# Patient Record
Sex: Male | Born: 1950 | Race: White | Hispanic: No | Smoking: Former smoker
Health system: Southern US, Community
[De-identification: ages and names within clinical notes are randomized; demographics above are authoritative.]

## PROBLEM LIST (undated history)

## (undated) DIAGNOSIS — D649 Anemia, unspecified: Secondary | ICD-10-CM

## (undated) DIAGNOSIS — G20A1 Parkinson's disease without dyskinesia, without mention of fluctuations: Secondary | ICD-10-CM

## (undated) DIAGNOSIS — M489 Spondylopathy, unspecified: Secondary | ICD-10-CM

## (undated) DIAGNOSIS — J942 Hemothorax: Secondary | ICD-10-CM

## (undated) DIAGNOSIS — R112 Nausea with vomiting, unspecified: Secondary | ICD-10-CM

## (undated) DIAGNOSIS — R7881 Bacteremia: Secondary | ICD-10-CM

## (undated) DIAGNOSIS — Z9889 Other specified postprocedural states: Secondary | ICD-10-CM

## (undated) DIAGNOSIS — H8109 Meniere's disease, unspecified ear: Secondary | ICD-10-CM

## (undated) DIAGNOSIS — K449 Diaphragmatic hernia without obstruction or gangrene: Secondary | ICD-10-CM

## (undated) DIAGNOSIS — E7800 Pure hypercholesterolemia, unspecified: Secondary | ICD-10-CM

## (undated) DIAGNOSIS — I4891 Unspecified atrial fibrillation: Secondary | ICD-10-CM

## (undated) DIAGNOSIS — B952 Enterococcus as the cause of diseases classified elsewhere: Secondary | ICD-10-CM

## (undated) DIAGNOSIS — I059 Rheumatic mitral valve disease, unspecified: Secondary | ICD-10-CM

## (undated) DIAGNOSIS — U071 COVID-19: Secondary | ICD-10-CM

## (undated) DIAGNOSIS — Z952 Presence of prosthetic heart valve: Secondary | ICD-10-CM

## (undated) DIAGNOSIS — K829 Disease of gallbladder, unspecified: Secondary | ICD-10-CM

## (undated) DIAGNOSIS — E785 Hyperlipidemia, unspecified: Secondary | ICD-10-CM

## (undated) DIAGNOSIS — R079 Chest pain, unspecified: Secondary | ICD-10-CM

## (undated) DIAGNOSIS — G20 Parkinson's disease: Secondary | ICD-10-CM

## (undated) DIAGNOSIS — E0590 Thyrotoxicosis, unspecified without thyrotoxic crisis or storm: Secondary | ICD-10-CM

## (undated) DIAGNOSIS — N400 Enlarged prostate without lower urinary tract symptoms: Secondary | ICD-10-CM

## (undated) DIAGNOSIS — J939 Pneumothorax, unspecified: Secondary | ICD-10-CM

## (undated) DIAGNOSIS — R0902 Hypoxemia: Secondary | ICD-10-CM

## (undated) DIAGNOSIS — R0602 Shortness of breath: Secondary | ICD-10-CM

## (undated) HISTORY — DX: Presence of prosthetic heart valve: Z95.2

## (undated) HISTORY — DX: Meniere's disease, unspecified ear: H81.09

## (undated) HISTORY — PX: CARDIAC CATHETERIZATION: SHX172

## (undated) HISTORY — DX: Rheumatic mitral valve disease, unspecified: I05.9

## (undated) HISTORY — DX: Enterococcus as the cause of diseases classified elsewhere: B95.2

## (undated) HISTORY — DX: Hyperlipidemia, unspecified: E78.5

## (undated) HISTORY — DX: Anemia, unspecified: D64.9

## (undated) HISTORY — PX: COLONOSCOPY: SHX174

## (undated) HISTORY — DX: Bacteremia: R78.81

## (undated) HISTORY — DX: Hypoxemia: R09.02

## (undated) HISTORY — DX: Pure hypercholesterolemia, unspecified: E78.00

## (undated) HISTORY — DX: Thyrotoxicosis, unspecified without thyrotoxic crisis or storm: E05.90

## (undated) HISTORY — DX: Parkinson's disease: G20

## (undated) HISTORY — DX: Pneumothorax, unspecified: J93.9

## (undated) HISTORY — DX: Spondylopathy, unspecified: M48.9

## (undated) HISTORY — DX: Disease of gallbladder, unspecified: K82.9

## (undated) HISTORY — DX: Shortness of breath: R06.02

## (undated) HISTORY — DX: Chest pain, unspecified: R07.9

## (undated) HISTORY — DX: Unspecified atrial fibrillation: I48.91

## (undated) HISTORY — DX: Diaphragmatic hernia without obstruction or gangrene: K44.9

## (undated) HISTORY — DX: Benign prostatic hyperplasia without lower urinary tract symptoms: N40.0

## (undated) HISTORY — PX: TONSILLECTOMY: SUR1361

## (undated) HISTORY — DX: COVID-19: U07.1

## (undated) HISTORY — PX: THYROIDECTOMY: SHX17

## (undated) HISTORY — DX: Parkinson's disease without dyskinesia, without mention of fluctuations: G20.A1

## (undated) NOTE — *Deleted (*Deleted)
CARDIOLOGY CONSULT NOTE       Patient ID: Brandon [name] MRN: [id number] DOB/AGE: 1951/07/04 14 y.o.  Admit date: (Not on file) Referring Physician: Little Primary Physician: Brandon Gosselin, MD Primary Cardiologist: New Reason for Consultation: Chest Pain  Active Problems:   * No active hospital problems. *   HPI:  61 y.o. referred by Dr Clarene Duke for chest pain I last saw patient in 2013. Previously seen by Dr Rudean Haskell with cath 2007 30-40% RCA/LAD disease Had normal stress echo 2013.  History of former smoking, HTN and HLD Medicare screening for AAA negative [place] 07/25/20 Sees Dr Tat for Parkinson's currently not on meds Had COVID in August 2021 received monoclonal antibody as outpatient    Went to ER 07/03/20 for central chest pain with palpitations and mild dyspnea but left before being seen due to delay in being seen CXR NAD Troponin negative x 2 Hb/Hct elevated 19.9 and 54.8 in setting of positive COVID test and testosterone use Chest pain was substernal and occurred after using elliptical and HR jumped to 155 bpm Chest pain for several hours despite negative troponin which suggests non cardiac etiology   *** ROS All other systems reviewed and negative except as noted above  Past Medical History:  Diagnosis Date  . Atrial fibrillation (HCC)   . BPH (benign prostatic hyperplasia)   . Cervical spine disease   . Chest pain 2007   none since  . Gallbladder disorder   . Hemopneumothorax on left   . Hiatal hernia   . Hypercholesteremia   . Hyperlipidemia   . Meniere disease   . Meniere's disease   . PONV (postoperative nausea and vomiting)    also has trouble waking up    Family History  Problem Relation Age of Onset  . Hypertension Mother   . Breast cancer Mother   . Heart disease Father   . Emphysema Father   . Heart attack Sister   . Heart attack Brother     Social History   Socioeconomic History  . Marital status: Widowed    Spouse name: Not on file  . Number of  children: Not on file  . Years of education: Not on file  . Highest education level: Not on file  Occupational History    Comment: factory work  Tobacco Use  . Smoking status: Former Smoker    Quit date: 04/18/1979    Years since quitting: 41.5  . Smokeless tobacco: Never Used  Vaping Use  . Vaping Use: Never used  Substance and Sexual Activity  . Alcohol use: Yes    Comment: 3 timesa a week (wine or beer)  . Drug use: No  . Sexual activity: Not on file  Other Topics Concern  . Not on file  Social History Narrative   Two level home alone   Right handed   Caffeine - one cup coffee am    Exercise - yes walk dogs   Education - 1 year of college    Social Determinants of Health   Financial Resource Strain:   . Difficulty of Paying Living Expenses: Not on file  Food Insecurity:   . Worried About Programme researcher, broadcasting/film/video in the Last Year: Not on file  . Ran Out of Food in the Last Year: Not on file  Transportation Needs:   . Lack of Transportation (Medical): Not on file  . Lack of Transportation (Non-Medical): Not on file  Physical Activity:   . Days of Exercise per  Week: Not on file  . Minutes of Exercise per Session: Not on file  Stress:   . Feeling of Stress : Not on file  Social Connections:   . Frequency of Communication with Friends and Family: Not on file  . Frequency of Social Gatherings with Friends and Family: Not on file  . Attends Religious Services: Not on file  . Active Member of Clubs or Organizations: Not on file  . Attends Banker Meetings: Not on file  . Marital Status: Not on file  Intimate Partner Violence:   . Fear of Current or Ex-Partner: Not on file  . Emotionally Abused: Not on file  . Physically Abused: Not on file  . Sexually Abused: Not on file    Past Surgical History:  Procedure Laterality Date  . CARDIAC CATHETERIZATION    . COLONOSCOPY    . ORIF CLAVICULAR FRACTURE Left 04/18/2016   Procedure: OPEN REDUCTION INTERNAL FIXATION  (ORIF) LEFT CLAVICLE FRACTURE;  Surgeon: Francena Hanly, MD;  Location: [facility] OR;  Service: Orthopedics;  Laterality: Left;  . THYROIDECTOMY    . TONSILLECTOMY        Current Outpatient Medications:  .  cholecalciferol (VITAMIN D) 1000 units tablet, Take 1,000 Units by mouth daily., Disp: , Rfl:  .  levothyroxine (SYNTHROID) 175 MCG tablet, Take 175 mcg by mouth daily., Disp: , Rfl:  .  OVER THE COUNTER MEDICATION, Gallbladder supplement CBD oil q hs for sleeping, Disp: , Rfl:  .  Probiotic Product (PROBIOTIC-10 PO), Take 1 capsule by mouth daily. , Disp: , Rfl:  .  testosterone cypionate (DEPOTESTOSTERONE CYPIONATE) 200 MG/ML injection, INJECT 0.6 MLS INTRAMUSCULARLY INTO LATERAL THIGH WEEKLY., Disp: , Rfl:  .  triamterene-hydrochlorothiazide (DYAZIDE) 37.5-25 MG capsule, Take 1 capsule by mouth daily., Disp: , Rfl:     Physical Exam: There were no vitals taken for this visit.   Affect appropriate Healthy:  appears stated age HEENT: normal Neck supple with no adenopathy JVP normal no bruits no thyromegaly Lungs clear with no wheezing and good diaphragmatic motion Heart:  S1/S2 no murmur, no rub, gallop or click PMI normal Abdomen: benighn, BS positve, no tenderness, no AAA no bruit.  No HSM or HJR Distal pulses intact with no bruits No edema Neuro non-focal Skin warm and dry No muscular weakness   Labs:   Lab Results  Component Value Date   WBC 7.1 07/03/2020   HGB 17.9 (H) 07/03/2020   HCT 54.8 (H) 07/03/2020   MCV 95.5 07/03/2020   PLT 255 07/03/2020   No results for input(s): NA, K, CL, CO2, BUN, CREATININE, CALCIUM, PROT, BILITOT, ALKPHOS, ALT, AST, GLUCOSE in the last 168 hours.  Invalid input(s): LABALBU No results found for: CKTOTAL, CKMB, CKMBINDEX, TROPONINI No results found for: CHOL No results found for: HDL No results found for: LDLCALC No results found for: TRIG No results found for: CHOLHDL No results found for: LDLDIRECT    Radiology: No results  found.  EKG: 07/07/20 SR rate 74 normal 10/16/20 SR rate 71 normal    ASSESSMENT AND PLAN:   1. Chest Pain:  Mild RCA/LAD disease cath 2007 normal stress echo 2013. ***  2. Elevated Hb/Hct:  He is on testosterone which should be stopped or decreased at the least f/u with primary  ***  3. Parkinson's:  Mild LUE tremor f/u Dr Tat no currently on medication   4. HTN:  Well controlled.  Continue current medications and low sodium Dash type diet.  5. Thyroid:  On replacement labs with primary   ***  Signed: Charlton Haws 10/16/2020, 10:25 AM

---

## 2000-02-28 ENCOUNTER — Ambulatory Visit (HOSPITAL_COMMUNITY): Admission: RE | Admit: 2000-02-28 | Discharge: 2000-02-28 | Payer: Self-pay | Admitting: *Deleted

## 2002-10-05 ENCOUNTER — Encounter (INDEPENDENT_AMBULATORY_CARE_PROVIDER_SITE_OTHER): Payer: Self-pay | Admitting: *Deleted

## 2002-10-05 ENCOUNTER — Ambulatory Visit (HOSPITAL_COMMUNITY): Admission: RE | Admit: 2002-10-05 | Discharge: 2002-10-05 | Payer: Self-pay | Admitting: *Deleted

## 2005-11-12 ENCOUNTER — Emergency Department (HOSPITAL_COMMUNITY): Admission: EM | Admit: 2005-11-12 | Discharge: 2005-11-12 | Payer: Self-pay | Admitting: Emergency Medicine

## 2005-11-25 DIAGNOSIS — R079 Chest pain, unspecified: Secondary | ICD-10-CM

## 2005-11-25 HISTORY — DX: Chest pain, unspecified: R07.9

## 2005-11-29 ENCOUNTER — Encounter: Admission: RE | Admit: 2005-11-29 | Discharge: 2005-11-29 | Payer: Self-pay | Admitting: Gastroenterology

## 2006-02-10 ENCOUNTER — Inpatient Hospital Stay (HOSPITAL_COMMUNITY): Admission: AD | Admit: 2006-02-10 | Discharge: 2006-02-11 | Payer: Self-pay | Admitting: Cardiology

## 2006-02-10 ENCOUNTER — Encounter: Payer: Self-pay | Admitting: Emergency Medicine

## 2006-03-31 ENCOUNTER — Ambulatory Visit (HOSPITAL_COMMUNITY): Admission: RE | Admit: 2006-03-31 | Discharge: 2006-03-31 | Payer: Self-pay | Admitting: Gastroenterology

## 2006-11-23 ENCOUNTER — Emergency Department (HOSPITAL_COMMUNITY): Admission: EM | Admit: 2006-11-23 | Discharge: 2006-11-23 | Payer: Self-pay | Admitting: Emergency Medicine

## 2007-06-25 ENCOUNTER — Other Ambulatory Visit: Admission: RE | Admit: 2007-06-25 | Discharge: 2007-06-25 | Payer: Self-pay | Admitting: Unknown Physician Specialty

## 2007-11-16 ENCOUNTER — Ambulatory Visit (HOSPITAL_COMMUNITY): Admission: RE | Admit: 2007-11-16 | Discharge: 2007-11-17 | Payer: Self-pay | Admitting: Surgery

## 2007-11-16 ENCOUNTER — Encounter (INDEPENDENT_AMBULATORY_CARE_PROVIDER_SITE_OTHER): Payer: Self-pay | Admitting: Surgery

## 2009-03-01 ENCOUNTER — Encounter: Admission: RE | Admit: 2009-03-01 | Discharge: 2009-03-01 | Payer: Self-pay | Admitting: Otolaryngology

## 2011-04-09 NOTE — Op Note (Signed)
Brandon [name], Brandon [name] NO.:  [id number]   MEDICAL RECORD NO.:  [id number]          PATIENT TYPE:  OIB   LOCATION:  1615                         FACILITY:  [facility]   PHYSICIAN:  Velora Heckler, MD      DATE OF BIRTH:  Mar 01, 1951   DATE OF PROCEDURE:  11/16/2007  DATE OF DISCHARGE:                               OPERATIVE REPORT   PREOPERATIVE DIAGNOSIS:  Bilateral thyroid nodules with cytologic  atypia.   POSTOP DIAGNOSIS:  Bilateral thyroid nodules with cytologic atypia.   PROCEDURE:  Total thyroidectomy.   SURGEON:  Velora Heckler, MD, FACS   ANESTHESIA:  General.   ESTIMATED BLOOD LOSS:  Minimal.   PREPARATION:  Betadine.   COMPLICATIONS:  None.   INDICATIONS:  The patient is a 60 year old white male from [place],  [place].  The patient was found to have bilateral thyroid  nodules, incidentally.  The patient had a follow up thyroid ultrasound  at [facility].  This showed an enlarged thyroid gland with  multiple nodules.  Fine-needle aspiration was performed, and showed  cytologic atypia with nuclear changes.  There were calcifications noted  on ultrasound.  Features were felt to be worrisome for possible thyroid  malignancy, and he was referred to surgery for thyroidectomy.   BODY OF REPORT:  Procedure is done in OR #11 at the [facility].  The patient is brought to the operating room,  placed in the supine position on the operating room table.  Following  administration of general anesthesia, the patient is positioned; and  then prepped and draped in the usual strict aseptic fashion.  After  ascertaining that an adequate level of anesthesia had been achieved, a  Kocher incision was made with a #15 blade.  Dissection was carried  through subcutaneous tissues and platysma.  Hemostasis was obtained with  the electrocautery.   Skin flaps were elevated cephalad and caudad from the thyroid notch to  the sternal notch.   A Mahorner self-retaining retractor is placed for  exposure.  Strap muscles are incised in the midline and dissection is  begun on the left.  Left thyroid lobe is slightly enlarged.  It contains  at least one dominant nodule towards the inferior pole.   Gland is gently mobilized with the Barista.  Venous tributaries  are divided between medium Ligaclips with the harmonic scalpel.  Superior pole vessels are dissected out, ligated in continuity with 2-0  silk ties, and divided with the harmonic scalpel.  Gland is rolled  anteriorly.  Parathyroid tissue is identified and preserved on its  vascular pedicle.  Inferior venous tributaries were ligated in  continuity with 2-0 silk ties, and divided with the harmonic scalpel.  Branches of the inferior thyroid artery are divided between small  Ligaclips with the harmonic scalpel.  Recurrent nerve is identified and  preserved.  Dissection is carried down to the ligament of Allyson Sabal which is  transected with the electrocautery.  Gland is mobilized up and onto the  anterior trachea.  A moderate size pyramidal lobe is dissected out  off  of the thyroid cartilage using the electrocautery for hemostasis.   Next, we turned our attention to the right thyroid lobe.  Again, strap  muscles are reflected laterally.  Multiple nodules were palpable within  the right lobe.  Venous tributaries are, again, divided between medium  Ligaclips with the harmonic scalpel.  Inferior venous tributaries are  ligated in continuity with 2-0 silk ties, and divided with the harmonic  scalpel.  Gland is rolled anteriorly.  Branches of the inferior thyroid  artery are divided between small Ligaclips with the harmonic scalpel.  Gland is rolled further anteriorly, and the ligament of Berry transected  with electrocautery.  A small remnant of thyroid tissue is noted next to  the recurrent nerve on the right.  Gland is excised off the trachea with  the electrocautery.  A  suture is used to mark the right superior pole.  The entire thyroid gland is then submitted to pathology for review.   Neck is irrigated bilaterally and good hemostasis is achieved.  Surgicel  is placed in the operative field.  Strap muscles are reapproximated in  midline with interrupted 3-0 Vicryl sutures.  Platysma is closed with  interrupted 3-0 Vicryl sutures.  Skin is closed with a running 4-0  Monocryl subcuticular suture.  Wound is washed and dried and Benzoin and  Steri-Strips are applied.  Sterile dressings are applied.  The patient  is awakened from anesthesia, and brought to the recovery room in stable  condition.  The patient tolerated the procedure well.      Velora Heckler, MD  Electronically Signed     TMG/MEDQ  D:  11/16/2007  T:  11/16/2007  Job:  [id number]   cc:   Thereasa Solo. Little, M.D.  Fax: [phone]   Dorisann Frames, M.D.  Fax: [phone]

## 2011-04-12 NOTE — H&P (Signed)
NAMEKASTEN, LEVEQUE NO.:  [id number]   MEDICAL RECORD NO.:  [id number]          PATIENT TYPE:  OIB   LOCATION:  2103                         FACILITY:  [facility]   PHYSICIAN:  Colleen Can. Deborah Chalk, M.D.DATE OF BIRTH:  [birth year]   DATE OF ADMISSION:  02/10/2006  DATE OF DISCHARGE:                                HISTORY & PHYSICAL   HISTORY:  Mr. Fees presented to [facility] Emergency Room with anterior  chest pain lasting approximately eight hours.  He was driving back from  [place] and began to have substernal chest discomfort that he attributed to a  previously diagnosed hiatal hernia.  He remained full in his chest and he  was unable to get relief with antacids.  He was brought to the Emergency  Room at [facility] at 6:12 a.m.  His initial EKG showed what was  felt to be ST elevation inferiorly.  His initial cardiac enzymes were  negative.  He is now referred for admission and cardiac catheterization.  He  has a positive family history of heart disease with his father having heart  attack at age 72.  He is not a smoker and generally has good health habits.  He tries to jog three times a week.  He has a history of  hypercholesterolemia.   He has been carrying a diagnosis of hiatal hernia and has had some GI  discomfort and distress over the last two or three years.  It has been  intermittently diagnosed as gallbladder disorder, but that has not been  confirmed.  His recent diagnosis includes hiatal hernia diagnosed by  endoscopy.  He has been on Protonix, but without significant relief.   PAST MEDICAL HISTORY:   ALLERGIES:  None.   CURRENT MEDICATIONS:  Protonix.   PREVIOUS HOSPITALIZATIONS:  None.   PREVIOUS OPERATIONS:  Sinus problems.   SOCIAL HISTORY:  He works for [employer].  He is married.  Nonsmoker.   REVIEW OF SYSTEMS:  Otherwise unremarkable.   PHYSICAL EXAMINATION:  GENERAL:  He is a pleasant 60 year old male with  moderate anterior  chest discomfort.  HEENT:  Negative.  NECK:  Supple.  LUNGS:  Clear.  HEART:  Regular rate and rhythm.  ABDOMEN:  Soft, nontender.  EXTREMITIES:  Peripheral pulses are intact.   His EKG shows ST elevation inferiorly consistent with an acute inferior  myocardial infarction.   Chest x-ray is pending.   OVERALL IMPRESSION:  1.  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  2.  History of hiatal hernia.  3.  History of hypercholesterolemia.   PLAN:  Patient will be admitted.  We will do acute catheterization.      Colleen Can. Deborah Chalk, M.D.  Electronically Signed     SNT/MEDQ  D:  02/10/2006  T:  02/10/2006  Job:  [id number]   cc:   Caryn Bee L. Little, M.D.  Fax: [phone]

## 2011-04-12 NOTE — Cardiovascular Report (Signed)
Brandon [name], Brandon [name] NO.:  [id number]   MEDICAL RECORD NO.:  [id number]          PATIENT TYPE:  OIB   LOCATION:  2103                         FACILITY:  [facility]   PHYSICIAN:  Colleen Can. Deborah Chalk, M.D.DATE OF BIRTH:  Oct 10, 1951   DATE OF PROCEDURE:  02/10/2006  DATE OF DISCHARGE:                              CARDIAC CATHETERIZATION   PROCEDURE:  Left heart catheterization with selective coronary angiography  and left ventricular angiography.   TYPE/SITE OF ENTRY:  Percutaneous right femoral artery.   CATHETERS:  6-French #4 curved Judkins right and left coronary cath, 6-  French pigtail ventriculographic catheter.   CONTRAST MATERIAL:  Omnipaque.   MEDICATIONS:  Given prior procedure, heparin.   Medications given during the procedure, nitroglycerin.   CONTRAST:  The patient tolerated the procedure well.   HEMODYNAMIC DATA:  The aortic pressure was 101/71. LV was 98/6-9. There was  no aortic valve gradient on pullback.   CORONARY ARTERIES:  Coronary arteries arise and distribute normally.  1.  Left main coronary artery is normal.  2.  Left anterior descending: Left anterior descending has irregularities      throughout. There is a 30-40% narrowing in the midportion but there is      slow but continuous flow to the apex. There are scattered irregularities      throughout the entire course of the left anterior descending.  3.  Left circumflex: Left circumflex is a reasonably large vessel. It      continues mainly as an obtuse marginal that has four separate smaller      branches off of it. There are only minor irregularities in the obtuse      marginal. The continuation branch of the AV groove is free of      significant disease.  4.  Right coronary artery. The right coronary artery is a large dominant      vessel. It has only minor irregularities and is essentially normal.   The left ventricular angiogram performed in RAO position. Overall cardiac  size and  silhouette are normal. Global ejection fraction is 60%.   OVERALL IMPRESSION:  1.  Normal left ventricular function.  2.  30-40% narrowing in the mid-left anterior descending artery with      irregularities otherwise but with no acute infarct-related vessel.   DISCUSSION:  This catheterization was done acutely in the setting of what  appeared to be an acute inferior myocardial infarction with associated EKG  changes. The initial cardiac markers were negative and he had had pain for  approximately 8 hours. He had been carrying the previous diagnosis of a  hiatal hernia and attributed some of the pain initially to that. We will  need to further intensify aggressive management of cardiovascular risk  factors.      Colleen Can. Deborah Chalk, M.D.  Electronically Signed     SNT/MEDQ  D:  02/10/2006  T:  02/10/2006  Job:  [id number]

## 2011-04-12 NOTE — Discharge Summary (Signed)
NAMEHILMAR, MOLDOVAN NO.:  [id number]   MEDICAL RECORD NO.:  [id number]          PATIENT TYPE:  INP   LOCATION:  2103                         FACILITY:  [facility]   PHYSICIAN:  Colleen Can. Deborah Chalk, M.D.DATE OF BIRTH:  [birth year]   DATE OF ADMISSION:  02/10/2006  DATE OF DISCHARGE:  02/11/2006                                 DISCHARGE SUMMARY   PRIMARY DISCHARGE DIAGNOSIS:  Anterior chest pain with abnormal EKG,  negative cardiac enzymes and subsequent emergent cardiac catheterization  showing normal left ventricular function, minor irregularities of the right  coronary, normal left main, 30% to 40% narrowing in the mid portion of the  left anterior descending, and satisfactory left circumflex. Overall, the  patient is felt to have nonobstructive coronary disease.   SECONDARY DISCHARGE DIAGNOSES:  1.  Hiatal hernia.  2.  Questionable gallbladder disorder.   HISTORY OF PRESENT ILLNESS:  The patient is a 60 year old white male who has  a history of hiatal hernia and questionable gallbladder disorder. He has  been maintained on Protonix. He presented to [facility] emergency room with  anterior chest pain that had persisted for approximately eight hours. His  EKG in the emergency room showed ST-elevation inferiorly, cardiac enzymes  were negative, and he was referred for emergent cardiac catheterization.   Please see dictated history and physical for the patient's presentation and  profile.   LABORATORY DATA:  Cardiac enzymes were negative. EKG showed ST elevation  inferiorly.  CBC was normal. Chemistries are satisfactory. Total bilirubin  slightly elevated at 1.9. Chest x-ray shows low lung volumes with mild  bibasilar atelectasis.   HOSPITAL COURSE:  The patient was transferred from [facility] to  [facility] where he underwent cardiac catheterization and that  procedure was tolerated well without any known complications. The patient  was felt  to have nonobstructive coronary disease with mild to moderate  diffuse disease in the LAD. Post procedure he was transferred to the  intensive care unit. Cardiac enzymes were negative. We proceeded on with CT  scanning of the abdomen and chest to rule out mass as well as dissection  after being hydrated with IV fluids. Pending those results, the patient is  felt to be a satisfactory candidate for discharge later today.   Today, on February 11, 2006, CT scans are pending. He has had no further chest  pain, all cardiac enzymes are negative, physical exam is unremarkable, and  he will be discharged later today if CT scans are satisfactory. Plans are  made for him to have outpatient 2-D echocardiogram.   DISCHARGE CONDITION:  Stable.   DISCHARGE DIET:  Low fat.   WOUND CARE:  He is to place an ice pack if needed to the groin.   DISCHARGE MEDICATIONS:  1.  Coated aspirin daily.  2.  Plavix 75 mg daily.  3.  Protonix 40 mg b.i.d.  4.  Lipitor 10 mg daily.  5.  Nitroglycerin p.r.n.   We will see him back in the office on Friday. He is asked to call and  arrange that time. We  will be calling him in the interim, however, for 2-D  echocardiogram.      Sharlee Blew, N.P.      Colleen Can. Deborah Chalk, M.D.  Electronically Signed    LC/MEDQ  D:  02/11/2006  T:  02/12/2006  Job:  [id number]   cc:   Caryn Bee L. Little, M.D.  Fax: [phone]

## 2011-08-30 LAB — BASIC METABOLIC PANEL
BUN: 15
Calcium: 9.7
Chloride: 108
GFR calc Af Amer: >60

## 2011-08-30 LAB — URINALYSIS, ROUTINE W REFLEX MICROSCOPIC
Bilirubin Urine: NEGATIVE
Hgb urine dipstick: NEGATIVE

## 2011-08-30 LAB — CALCIUM: Calcium: 9.4

## 2011-08-30 LAB — DIFFERENTIAL
Basophils Absolute: 0
Eosinophils Absolute: 0.3
Eosinophils Relative: 4
Lymphs Abs: 1.7
Monocytes Absolute: 0.5
Neutro Abs: 4.8

## 2011-08-30 LAB — CBC
HCT: 43
Hemoglobin: 15.1

## 2012-07-31 ENCOUNTER — Emergency Department (HOSPITAL_COMMUNITY)
Admission: EM | Admit: 2012-07-31 | Discharge: 2012-08-01 | Disposition: A | Discharge status: Home / Self Care | Payer: 59 | Attending: Emergency Medicine | Admitting: Emergency Medicine

## 2012-07-31 ENCOUNTER — Emergency Department (HOSPITAL_COMMUNITY): Payer: 59

## 2012-07-31 ENCOUNTER — Encounter (HOSPITAL_COMMUNITY): Payer: Self-pay | Admitting: Emergency Medicine

## 2012-07-31 DIAGNOSIS — R079 Chest pain, unspecified: Secondary | ICD-10-CM | POA: Insufficient documentation

## 2012-07-31 LAB — BASIC METABOLIC PANEL
BUN: 10 mg/dL (ref 6–23)
Chloride: 100 mEq/L (ref 96–112)
GFR calc Af Amer: >90 mL/min (ref >90)
GFR calc non Af Amer: >90 mL/min (ref >90)

## 2012-07-31 LAB — CBC
HCT: 42.8 % (ref 39.0–52.0)
Hemoglobin: 14.5 g/dL (ref 13.0–17.0)
MCV: 92.4 fL (ref 78.0–100.0)
Platelets: 193 10*3/uL (ref 150–400)
RBC: 4.63 MIL/uL (ref 4.22–5.81)
RDW: 13.2 % (ref 11.5–15.5)
WBC: 7 10*3/uL (ref 4.0–10.5)

## 2012-07-31 MED ORDER — SODIUM CHLORIDE 0.9 % IV SOLN
1000.0000 mL | INTRAVENOUS | Status: DC
  Administered 2012-08-01: 1000 mL via INTRAVENOUS

## 2012-07-31 MED ORDER — ASPIRIN 81 MG PO CHEW
324.0000 mg | CHEWABLE_TABLET | Freq: Once | ORAL | Status: DC
  Filled 2012-07-31: qty 4

## 2012-07-31 MED ORDER — NITROGLYCERIN 0.4 MG SL SUBL: 0.4000 mg | SUBLINGUAL_TABLET | SUBLINGUAL | Status: DC | PRN

## 2012-07-31 NOTE — ED Notes (Signed)
C/o pain across chest x 2 days.  Denies nausea, vomiting, sob, and diaphoresis.  Reports BP elevated today.

## 2012-08-01 LAB — URINALYSIS, ROUTINE W REFLEX MICROSCOPIC
Ketones, ur: NEGATIVE mg/dL
Leukocytes, UA: NEGATIVE
Nitrite: NEGATIVE
Protein, ur: NEGATIVE mg/dL
Urobilinogen, UA: 0.2 mg/dL (ref 0.0–1.0)
pH: 7 (ref 5.0–8.0)

## 2012-08-01 NOTE — ED Notes (Signed)
Pt unable to give urine sample at this time 

## 2012-08-01 NOTE — ED Provider Notes (Signed)
History     CSN: [id number]  Arrival date & time 07/31/12  2111   First MD Initiated Contact with Patient 07/31/12 2328      Chief Complaint  Patient presents with  . Chest Pain    (Consider location/radiation/quality/duration/timing/severity/associated sxs/prior treatment) The history is provided by the patient and the spouse.    61 y/o male INAD c/o pleuritic CP onset yesterday morning across anterior chest rated at 3/10, described as aching. Pt was burning brush the day before, no unusual exertion that would cause a muscle strain. Denies SOB, N/V, Diaphoresis, cough, fever.   RF:  Father had triple bypass @ 102 years old Cath with no stents in 2006 Last stress test was 10 years  PCP: [name]. No cardiolgogist   History reviewed. No pertinent past medical history.  Past Surgical History  Procedure Date  . Cardiac catheterization     No family history on file.  History  Substance Use Topics  . Smoking status: Never Smoker   . Smokeless tobacco: Not on file  . Alcohol Use: No      Review of Systems  Constitutional: Negative for fever.  Respiratory: Negative for shortness of breath.   Cardiovascular: Positive for chest pain. Negative for leg swelling.  Gastrointestinal: Negative for nausea, vomiting, abdominal pain and diarrhea.  All other systems reviewed and are negative.    Allergies  Review of patient's allergies indicates no known allergies.  Home Medications   Current Outpatient Rx  Name Route Sig Dispense Refill  . GINKGO BILOBA 40 MG PO TABS Oral Take 1 tablet by mouth daily.    Marland Kitchen LEVOTHYROXINE SODIUM 175 MCG PO TABS Oral Take 175 mcg by mouth daily.    . ADULT MULTIVITAMIN W/MINERALS CH Oral Take 1 tablet by mouth daily.      BP 152/85  Pulse 62  Temp 98.5 F (36.9 C) (Oral)  Resp 19  SpO2 100%  Physical Exam  Nursing note and vitals reviewed. Constitutional: He is oriented to person, place, and time. He appears well-developed and  well-nourished. No distress.  HENT:  Head: Normocephalic and atraumatic.  Eyes: Conjunctivae and EOM are normal. Pupils are equal, round, and reactive to light.  Neck: No JVD present.  Cardiovascular: Normal rate, regular rhythm and normal heart sounds.  Exam reveals no gallop and no friction rub.   No murmur heard. Pulmonary/Chest: Effort normal and breath sounds normal. No respiratory distress. He has no wheezes. He has no rales. He exhibits no tenderness.  Abdominal: Soft. Bowel sounds are normal. He exhibits no distension and no mass. There is no tenderness. There is no rebound and no guarding.  Musculoskeletal: Normal range of motion. He exhibits no edema.  Neurological: He is alert and oriented to person, place, and time.  Psychiatric: He has a normal mood and affect.    ED Course  Procedures (including critical care time)   Labs Reviewed  CBC  BASIC METABOLIC PANEL  POCT I-STAT TROPONIN I  URINALYSIS, ROUTINE W REFLEX MICROSCOPIC   Dg Chest Port 1 View  08/01/2012  *RADIOLOGY REPORT*  Clinical Data: Worsening chest pain.  PORTABLE CHEST - 1 VIEW  Comparison: 11/22/2007  Findings: Heart size is within normal limits and stable.  Both lungs are clear.  Calcified granuloma again seen in the right upper lobe.  No evidence of pleural effusion.  No mass or lymphadenopathy identified.  IMPRESSION: Stable exam.  No active disease.   Original Report Authenticated By: Danae Orleans, M.D.  Date: 08/01/2012  Rate: 60  Rhythm: normal sinus rhythm  QRS Axis: normal  Intervals: normal  ST/T Wave abnormalities: normal  Conduction Disutrbances:none  Narrative Interpretation:   Old EKG Reviewed: unchanged    No diagnosis found.    MDM  61 year old male with a 3/10 aching chest pain constant over the course of 2 days lessening in severity today with no associated symptoms. Patient has a normal EKG and first troponin is negative.  Care remains with Dr. Effie Shy at shift change. He  will obtain a second troponin and cardiology consult.       Wynetta Emery, PA-C 08/01/12 0149

## 2012-08-01 NOTE — ED Provider Notes (Signed)
Brandon [name] is a 61 y.o. male with known nonobstructive coronary artery disease presents with chest pain that started yesterday, constantly, then, today it is intermittent with a pleuritic component. There are no associated symptoms. He denies nausea, vomiting, diaphoresis, weakness, dizziness, or shortness of breath. Heart- normal rate. Lungs- normal respiratory effort. Neurologic nonfocal. Alert and oriented x3.  Second cardiac marker ordered. [facility] consult cardiology regarding disposition. He has seen his cardiologist within the last 2 years for evaluation of chest discomfort, and an episode of palpitations. Her cardiologist is since retired.   03:55- he remains comfortable. Troponin I trending negative. Case discussed with Dr. Antoine Poche. He will arrange followup in the office, for reevaluation and definitive testing.  Medical screening examination/treatment/procedure(s) were conducted as a shared visit with non-physician practitioner(s) and myself.  I personally evaluated the patient during the encounter  Flint Melter, MD 08/01/12 [id number]

## 2012-08-05 ENCOUNTER — Encounter: Payer: Self-pay | Admitting: Cardiology

## 2012-08-05 ENCOUNTER — Encounter: Payer: Self-pay | Admitting: *Deleted

## 2012-08-06 ENCOUNTER — Encounter: Payer: Self-pay | Admitting: Cardiovascular Disease

## 2012-08-06 ENCOUNTER — Ambulatory Visit (INDEPENDENT_AMBULATORY_CARE_PROVIDER_SITE_OTHER): Payer: 59 | Admitting: Cardiovascular Disease

## 2012-08-06 VITALS — BP 141/85 | HR 61 | Wt 180.0 lb

## 2012-08-06 DIAGNOSIS — R030 Elevated blood-pressure reading, without diagnosis of hypertension: Secondary | ICD-10-CM

## 2012-08-06 DIAGNOSIS — R079 Chest pain, unspecified: Secondary | ICD-10-CM | POA: Insufficient documentation

## 2012-08-06 NOTE — Assessment & Plan Note (Signed)
F/U 8 weeks  See what BP does on ETT  He will get monitor

## 2012-08-06 NOTE — Progress Notes (Signed)
Patient ID: Brandon [name], male   DOB: Feb 05, 1951, 61 y.o.   MRN: [id number] 61 yo previously seen by Dr Deborah Chalk.  2007 chest pain with cath showing 30-40% RCA and LAD disease.  Under a lot of stress at work with [employer].  Seen in ER last week with [facility].  R/O Pain resting GI overtones with indigestion but did not get better with antacids.  Lasted about an hour.  D/c for outpatient f/u  No recurence. BP been running high.  No dyspnea palpitations.  Normally works out at [employer] but has been slack lately.    ROS: Denies fever, malais, weight loss, blurry vision, decreased visual acuity, cough, sputum, SOB, hemoptysis, pleuritic pain, palpitaitons, heartburn, abdominal pain, melena, lower extremity edema, claudication, or rash.  All other systems reviewed and negative   General: Affect appropriate Healthy:  appears stated age HEENT: normal Neck supple with no adenopathy JVP normal no bruits no thyromegaly Lungs clear with no wheezing and good diaphragmatic motion Heart:  S1/S2 no murmur,rub, gallop or click PMI normal Abdomen: benighn, BS positve, no tenderness, no AAA no bruit.  No HSM or HJR Distal pulses intact with no bruits No edema Neuro non-focal Skin warm and dry No muscular weakness  Medications Current Outpatient Prescriptions  Medication Sig Dispense Refill  . Ginkgo Biloba 40 MG TABS Take 1 tablet by mouth daily.      Marland Kitchen levothyroxine (SYNTHROID, LEVOTHROID) 175 MCG tablet Take 175 mcg by mouth daily.      . Multiple Vitamin (MULTIVITAMIN WITH MINERALS) TABS Take 1 tablet by mouth daily.        Allergies Review of patient's allergies indicates no known allergies.  Family History: No family history on file.  Social History: History   Social History  . Marital Status: Married    Spouse Name: N/A    Number of Children: N/A  . Years of Education: N/A   Occupational History  . Not on file.   Social History Main Topics  . Smoking status: Never Smoker   . Smokeless  tobacco: Not on file  . Alcohol Use: No  . Drug Use: No  . Sexually Active:    Other Topics Concern  . Not on file   Social History Narrative  . No narrative on file    Electrocardiogram:  NSR rate 69  Normal ECG  Assessment and Plan

## 2012-08-06 NOTE — Assessment & Plan Note (Signed)
Atypical nonobstructive disease in 2007  F/U stress echo

## 2012-08-06 NOTE — Patient Instructions (Signed)
Your physician recommends that you schedule a follow-up appointment in:  8 WEEKS WITH DR [facility]  Your physician recommends that you continue on your current medications as directed. Please refer to the Current Medication list given to you today.  Your physician has requested that you have a stress echocardiogram. For further information please visit [email or web address] Please follow instruction sheet as given. DX CHEST PAIN

## 2012-08-24 ENCOUNTER — Ambulatory Visit (HOSPITAL_COMMUNITY): Payer: 59 | Attending: Cardiovascular Disease | Admitting: Radiology

## 2012-08-24 ENCOUNTER — Encounter: Payer: Self-pay | Admitting: Cardiovascular Disease

## 2012-08-24 ENCOUNTER — Ambulatory Visit (HOSPITAL_COMMUNITY): Payer: 59 | Attending: Cardiology

## 2012-08-24 DIAGNOSIS — R079 Chest pain, unspecified: Secondary | ICD-10-CM

## 2012-08-24 DIAGNOSIS — I10 Essential (primary) hypertension: Secondary | ICD-10-CM | POA: Insufficient documentation

## 2012-08-24 DIAGNOSIS — I2510 Atherosclerotic heart disease of native coronary artery without angina pectoris: Secondary | ICD-10-CM | POA: Insufficient documentation

## 2012-08-24 DIAGNOSIS — R072 Precordial pain: Secondary | ICD-10-CM

## 2012-08-24 DIAGNOSIS — R0989 Other specified symptoms and signs involving the circulatory and respiratory systems: Secondary | ICD-10-CM

## 2012-08-24 NOTE — Progress Notes (Signed)
Stress Echocardiogram performed.  

## 2012-10-01 DIAGNOSIS — E7800 Pure hypercholesterolemia, unspecified: Secondary | ICD-10-CM | POA: Insufficient documentation

## 2012-10-01 DIAGNOSIS — K829 Disease of gallbladder, unspecified: Secondary | ICD-10-CM | POA: Insufficient documentation

## 2012-10-01 DIAGNOSIS — K449 Diaphragmatic hernia without obstruction or gangrene: Secondary | ICD-10-CM | POA: Insufficient documentation

## 2012-10-05 ENCOUNTER — Encounter: Payer: Self-pay | Admitting: Cardiovascular Disease

## 2012-10-05 ENCOUNTER — Ambulatory Visit (INDEPENDENT_AMBULATORY_CARE_PROVIDER_SITE_OTHER): Payer: 59 | Admitting: Cardiovascular Disease

## 2012-10-05 VITALS — BP 108/69 | HR 66 | Ht 72.0 in | Wt 182.0 lb

## 2012-10-05 DIAGNOSIS — E7800 Pure hypercholesterolemia, unspecified: Secondary | ICD-10-CM

## 2012-10-05 DIAGNOSIS — R079 Chest pain, unspecified: Secondary | ICD-10-CM

## 2012-10-05 DIAGNOSIS — R030 Elevated blood-pressure reading, without diagnosis of hypertension: Secondary | ICD-10-CM

## 2012-10-05 NOTE — Assessment & Plan Note (Signed)
Resolved normal stress echo

## 2012-10-05 NOTE — Progress Notes (Signed)
Patient ID: Brandon [name], male   DOB: 1951-09-13, 61 y.o.   MRN: [id number] 61 yo previously seen by Dr Deborah Chalk. 2007 chest pain with cath showing 30-40% RCA and LAD disease. Under a lot of stress at work with [employer]. Seen in ER last week with [facility]. R/O Pain resting GI overtones with indigestion but did not get better with antacids. Lasted about an hour. D/c for outpatient f/u No recurence. BP been running high. No dyspnea palpitations. Normally works out at [employer] but has been slack lately.   F/U stress echo reviewed 9/20  Normal but systolic BP rose to 217 mmHg  Home BP readings overall pretty good.  Discussed diet exercise and low sodium diet  He was not keen on starting meds and his BP is borderline  ROS: Denies fever, malais, weight loss, blurry vision, decreased visual acuity, cough, sputum, SOB, hemoptysis, pleuritic pain, palpitaitons, heartburn, abdominal pain, melena, lower extremity edema, claudication, or rash.  All other systems reviewed and negative   General: Affect appropriate Healthy:  appears stated age HEENT: normal Neck supple with no adenopathy JVP normal no bruits no thyromegaly Lungs clear with no wheezing and good diaphragmatic motion Heart:  S1/S2 no murmur,rub, gallop or click PMI normal Abdomen: benighn, BS positve, no tenderness, no AAA no bruit.  No HSM or HJR Distal pulses intact with no bruits No edema Neuro non-focal Skin warm and dry No muscular weakness  Medications Current Outpatient Prescriptions  Medication Sig Dispense Refill  . Ginkgo Biloba 40 MG TABS Take 1 tablet by mouth daily.      Marland Kitchen levothyroxine (SYNTHROID, LEVOTHROID) 175 MCG tablet Take 175 mcg by mouth daily.      . Multiple Vitamin (MULTIVITAMIN WITH MINERALS) TABS Take 1 tablet by mouth daily.        Allergies Review of patient's allergies indicates no known allergies.  Family History: No family history on file.  Social History: History   Social History  . Marital  Status: Married    Spouse Name: N/A    Number of Children: N/A  . Years of Education: N/A   Occupational History  . Not on file.   Social History Main Topics  . Smoking status: Never Smoker   . Smokeless tobacco: Not on file  . Alcohol Use: No  . Drug Use: No  . Sexually Active:    Other Topics Concern  . Not on file   Social History Narrative  . No narrative on file    Electrocardiogram:  07/31/12  SR rate 64 normal  Assessment and Plan

## 2012-10-05 NOTE — Assessment & Plan Note (Signed)
Cholesterol is at goal.  Continue current dose of statin and diet Rx.  No myalgias or side effects.  F/U  LFT's in 6 months. No results found for this basename: LDLCALC             

## 2012-10-05 NOTE — Assessment & Plan Note (Signed)
Encouraged him to F/U with Dr [name].  Borderline at this time and he is not keen on meds.  Discussed diet and exercise and he does a good job of home monitoring

## 2012-10-05 NOTE — Patient Instructions (Signed)
Your physician recommends that you schedule a follow-up appointment in: AS NEEDED  Your physician recommends that you continue on your current medications as directed. Please refer to the Current Medication list given to you today.  

## 2013-06-25 ENCOUNTER — Other Ambulatory Visit: Payer: Self-pay | Admitting: Gastroenterology

## 2013-07-06 ENCOUNTER — Other Ambulatory Visit: Payer: Self-pay | Admitting: Gastroenterology

## 2013-07-06 ENCOUNTER — Ambulatory Visit
Admission: RE | Admit: 2013-07-06 | Discharge: 2013-07-06 | Disposition: A | Discharge status: Home / Self Care | Payer: 59 | Source: Ambulatory Visit | Attending: Gastroenterology | Admitting: Gastroenterology

## 2013-07-06 DIAGNOSIS — R109 Unspecified abdominal pain: Secondary | ICD-10-CM

## 2013-07-06 MED ORDER — IOHEXOL 300 MG/ML  SOLN
100.0000 mL | Freq: Once | INTRAMUSCULAR | Status: AC | PRN
  Administered 2013-07-06: 100 mL via INTRAVENOUS

## 2014-02-15 ENCOUNTER — Other Ambulatory Visit: Payer: Self-pay | Admitting: Otolaryngology

## 2014-02-15 DIAGNOSIS — H8109 Meniere's disease, unspecified ear: Secondary | ICD-10-CM

## 2014-02-15 DIAGNOSIS — H9319 Tinnitus, unspecified ear: Secondary | ICD-10-CM

## 2014-02-24 ENCOUNTER — Other Ambulatory Visit (INDEPENDENT_AMBULATORY_CARE_PROVIDER_SITE_OTHER): Payer: Self-pay | Admitting: Otolaryngology

## 2014-02-24 DIAGNOSIS — J329 Chronic sinusitis, unspecified: Secondary | ICD-10-CM

## 2014-03-04 ENCOUNTER — Other Ambulatory Visit: Payer: 59

## 2014-03-10 ENCOUNTER — Ambulatory Visit
Admission: RE | Admit: 2014-03-10 | Discharge: 2014-03-10 | Disposition: A | Discharge status: Home / Self Care | Payer: 59 | Source: Ambulatory Visit | Attending: Otolaryngology | Admitting: Otolaryngology

## 2014-03-10 DIAGNOSIS — J329 Chronic sinusitis, unspecified: Secondary | ICD-10-CM

## 2014-03-10 DIAGNOSIS — H9319 Tinnitus, unspecified ear: Secondary | ICD-10-CM

## 2014-03-10 DIAGNOSIS — H8109 Meniere's disease, unspecified ear: Secondary | ICD-10-CM

## 2014-03-10 MED ORDER — GADOBENATE DIMEGLUMINE 529 MG/ML IV SOLN
17.0000 mL | Freq: Once | INTRAVENOUS | Status: AC | PRN
  Administered 2014-03-10: 17 mL via INTRAVENOUS

## 2016-03-30 ENCOUNTER — Emergency Department (HOSPITAL_COMMUNITY): Payer: 59

## 2016-03-30 ENCOUNTER — Inpatient Hospital Stay (HOSPITAL_COMMUNITY)
Admission: EM | Admit: 2016-03-30 | Discharge: 2016-04-07 | DRG: 964 | Disposition: A | Discharge status: Home / Self Care | Payer: 59 | Attending: General Surgery | Admitting: General Surgery

## 2016-03-30 ENCOUNTER — Encounter (HOSPITAL_COMMUNITY): Payer: Self-pay | Admitting: Emergency Medicine

## 2016-03-30 ENCOUNTER — Inpatient Hospital Stay (HOSPITAL_COMMUNITY): Payer: 59

## 2016-03-30 DIAGNOSIS — S2242XA Multiple fractures of ribs, left side, initial encounter for closed fracture: Secondary | ICD-10-CM | POA: Diagnosis present

## 2016-03-30 DIAGNOSIS — S27321A Contusion of lung, unilateral, initial encounter: Secondary | ICD-10-CM | POA: Diagnosis present

## 2016-03-30 DIAGNOSIS — T1490XA Injury, unspecified, initial encounter: Secondary | ICD-10-CM

## 2016-03-30 DIAGNOSIS — S272XXA Traumatic hemopneumothorax, initial encounter: Secondary | ICD-10-CM

## 2016-03-30 DIAGNOSIS — W1789XA Other fall from one level to another, initial encounter: Secondary | ICD-10-CM | POA: Diagnosis present

## 2016-03-30 DIAGNOSIS — R339 Retention of urine, unspecified: Secondary | ICD-10-CM | POA: Diagnosis not present

## 2016-03-30 DIAGNOSIS — R338 Other retention of urine: Secondary | ICD-10-CM | POA: Diagnosis not present

## 2016-03-30 DIAGNOSIS — D62 Acute posthemorrhagic anemia: Secondary | ICD-10-CM | POA: Diagnosis not present

## 2016-03-30 DIAGNOSIS — S42002A Fracture of unspecified part of left clavicle, initial encounter for closed fracture: Secondary | ICD-10-CM | POA: Diagnosis present

## 2016-03-30 DIAGNOSIS — J939 Pneumothorax, unspecified: Secondary | ICD-10-CM

## 2016-03-30 DIAGNOSIS — Z79899 Other long term (current) drug therapy: Secondary | ICD-10-CM | POA: Diagnosis not present

## 2016-03-30 DIAGNOSIS — Z4682 Encounter for fitting and adjustment of non-vascular catheter: Secondary | ICD-10-CM

## 2016-03-30 DIAGNOSIS — S280XXA Crushed chest, initial encounter: Principal | ICD-10-CM | POA: Diagnosis present

## 2016-03-30 DIAGNOSIS — W208XXA Other cause of strike by thrown, projected or falling object, initial encounter: Secondary | ICD-10-CM | POA: Diagnosis present

## 2016-03-30 DIAGNOSIS — E032 Hypothyroidism due to medicaments and other exogenous substances: Secondary | ICD-10-CM | POA: Diagnosis present

## 2016-03-30 DIAGNOSIS — S298XXA Other specified injuries of thorax, initial encounter: Secondary | ICD-10-CM | POA: Diagnosis present

## 2016-03-30 DIAGNOSIS — J942 Hemothorax: Secondary | ICD-10-CM | POA: Diagnosis present

## 2016-03-30 DIAGNOSIS — S2249XA Multiple fractures of ribs, unspecified side, initial encounter for closed fracture: Secondary | ICD-10-CM

## 2016-03-30 DIAGNOSIS — I10 Essential (primary) hypertension: Secondary | ICD-10-CM | POA: Diagnosis present

## 2016-03-30 DIAGNOSIS — Z9689 Presence of other specified functional implants: Secondary | ICD-10-CM

## 2016-03-30 DIAGNOSIS — S299XXA Unspecified injury of thorax, initial encounter: Secondary | ICD-10-CM | POA: Diagnosis present

## 2016-03-30 LAB — BASIC METABOLIC PANEL
Anion gap: 11 (ref 5–15)
BUN: 12 mg/dL (ref 6–20)
CO2: 26 mmol/L (ref 22–32)
Calcium: 8.9 mg/dL (ref 8.9–10.3)
Chloride: 104 mmol/L (ref 101–111)
Creatinine, Ser: 0.94 mg/dL (ref 0.61–1.24)
GFR calc Af Amer: >60 mL/min (ref >60)
Glucose, Bld: 132 mg/dL — ABNORMAL HIGH (ref 65–99)
POTASSIUM: 3.7 mmol/L (ref 3.5–5.1)
SODIUM: 141 mmol/L (ref 135–145)

## 2016-03-30 LAB — CBC
HEMATOCRIT: 48.5 % (ref 39.0–52.0)
Hemoglobin: 16 g/dL (ref 13.0–17.0)
MCH: 31.4 pg (ref 26.0–34.0)
MCHC: 33 g/dL (ref 30.0–36.0)
MCV: 95.1 fL (ref 78.0–100.0)
PLATELETS: 237 10*3/uL (ref 150–400)
RBC: 5.1 MIL/uL (ref 4.22–5.81)
RDW: 13.6 % (ref 11.5–15.5)
WBC: 9.7 10*3/uL (ref 4.0–10.5)

## 2016-03-30 LAB — I-STAT CHEM 8, ED
BUN: 13 mg/dL (ref 6–20)
CALCIUM ION: 1.09 mmol/L — AB (ref 1.13–1.30)
CHLORIDE: 102 mmol/L (ref 101–111)
Creatinine, Ser: 1 mg/dL (ref 0.61–1.24)
GLUCOSE: 127 mg/dL — AB (ref 65–99)
HCT: 52 % (ref 39.0–52.0)
Hemoglobin: 17.7 g/dL — ABNORMAL HIGH (ref 13.0–17.0)
POTASSIUM: 3.8 mmol/L (ref 3.5–5.1)
Sodium: 142 mmol/L (ref 135–145)
TCO2: 27 mmol/L (ref 0–100)

## 2016-03-30 LAB — PROTIME-INR
INR: 1.1 (ref 0.00–1.49)
Prothrombin Time: 13.9 seconds (ref 11.6–15.2)

## 2016-03-30 LAB — I-STAT TROPONIN, ED: TROPONIN I, POC: 0 ng/mL (ref 0.00–0.08)

## 2016-03-30 MED ORDER — LIDOCAINE HCL 2 % IJ SOLN
INTRAMUSCULAR | Status: AC
  Filled 2016-03-30: qty 20

## 2016-03-30 MED ORDER — KETAMINE HCL 10 MG/ML IJ SOLN
INTRAMUSCULAR | Status: AC | PRN
  Administered 2016-03-30 (×2): 25 mg via INTRAVENOUS

## 2016-03-30 MED ORDER — KCL IN DEXTROSE-NACL 20-5-0.45 MEQ/L-%-% IV SOLN
INTRAVENOUS | Status: DC
  Administered 2016-03-30 – 2016-03-31 (×2): via INTRAVENOUS
  Filled 2016-03-30 (×4): qty 1000

## 2016-03-30 MED ORDER — HYDROMORPHONE HCL 1 MG/ML IJ SOLN: 1.0000 mg | INTRAMUSCULAR | Status: DC | PRN

## 2016-03-30 MED ORDER — FENTANYL CITRATE (PF) 100 MCG/2ML IJ SOLN
INTRAMUSCULAR | Status: AC
  Filled 2016-03-30: qty 2

## 2016-03-30 MED ORDER — OXYCODONE HCL 5 MG PO TABS
5.0000 mg | ORAL_TABLET | ORAL | Status: DC | PRN
  Administered 2016-03-30 – 2016-03-31 (×2): 10 mg via ORAL
  Filled 2016-03-30 (×2): qty 2

## 2016-03-30 MED ORDER — ACETAMINOPHEN 650 MG RE SUPP: 650.0000 mg | Freq: Four times a day (QID) | RECTAL | Status: DC | PRN

## 2016-03-30 MED ORDER — ULIPRISTAL ACETATE 30 MG PO TABS: 1.0000 | ORAL_TABLET | Freq: Once | ORAL | Status: DC

## 2016-03-30 MED ORDER — ONDANSETRON HCL 4 MG/2ML IJ SOLN
4.0000 mg | Freq: Once | INTRAMUSCULAR | Status: AC
  Administered 2016-03-30: 4 mg via INTRAVENOUS
  Filled 2016-03-30: qty 2

## 2016-03-30 MED ORDER — KETAMINE HCL-SODIUM CHLORIDE 100-0.9 MG/10ML-% IV SOSY
0.3000 mg/kg | PREFILLED_SYRINGE | Freq: Once | INTRAVENOUS | Status: DC
  Filled 2016-03-30: qty 10

## 2016-03-30 MED ORDER — DIPHENHYDRAMINE HCL 50 MG/ML IJ SOLN
25.0000 mg | Freq: Once | INTRAMUSCULAR | Status: AC
  Administered 2016-04-02: 25 mg via INTRAVENOUS
  Filled 2016-03-30: qty 1

## 2016-03-30 MED ORDER — METHOCARBAMOL 1000 MG/10ML IJ SOLN
500.0000 mg | Freq: Once | INTRAVENOUS | Status: AC
  Administered 2016-03-30: 500 mg via INTRAVENOUS
  Filled 2016-03-30: qty 550

## 2016-03-30 MED ORDER — ONDANSETRON HCL 4 MG/2ML IJ SOLN
4.0000 mg | Freq: Four times a day (QID) | INTRAMUSCULAR | Status: DC | PRN
  Administered 2016-03-30 – 2016-04-05 (×6): 4 mg via INTRAVENOUS
  Filled 2016-03-30 (×10): qty 2

## 2016-03-30 MED ORDER — LORAZEPAM 2 MG/ML IJ SOLN
1.0000 mg | Freq: Once | INTRAMUSCULAR | Status: AC
  Administered 2016-03-30: 1 mg via INTRAVENOUS
  Filled 2016-03-30: qty 1

## 2016-03-30 MED ORDER — FENTANYL CITRATE (PF) 100 MCG/2ML IJ SOLN
100.0000 ug | Freq: Once | INTRAMUSCULAR | Status: AC
  Administered 2016-03-30: 100 ug via INTRAVENOUS

## 2016-03-30 MED ORDER — ONDANSETRON 4 MG PO TBDP
4.0000 mg | ORAL_TABLET | Freq: Four times a day (QID) | ORAL | Status: DC | PRN
  Administered 2016-04-05 (×2): 4 mg via ORAL
  Filled 2016-03-30 (×2): qty 1

## 2016-03-30 MED ORDER — HYDROMORPHONE HCL 1 MG/ML IJ SOLN
1.0000 mg | INTRAMUSCULAR | Status: DC | PRN
  Administered 2016-03-30 (×3): 1 mg via INTRAVENOUS
  Filled 2016-03-30 (×3): qty 1

## 2016-03-30 MED ORDER — LIDOCAINE-EPINEPHRINE 2 %-1:100000 IJ SOLN: 30.0000 mL | Freq: Once | INTRAMUSCULAR | Status: DC

## 2016-03-30 MED ORDER — ACETAMINOPHEN 325 MG PO TABS
650.0000 mg | ORAL_TABLET | Freq: Four times a day (QID) | ORAL | Status: DC | PRN
  Administered 2016-04-06: 650 mg via ORAL
  Filled 2016-03-30 (×2): qty 2

## 2016-03-30 MED ORDER — IOPAMIDOL (ISOVUE-300) INJECTION 61%
75.0000 mL | Freq: Once | INTRAVENOUS | Status: AC | PRN
  Administered 2016-03-30: 75 mL via INTRAVENOUS

## 2016-03-30 NOTE — ED Notes (Signed)
We have just found that tube needs to be re-done (left chest tube).  Pt. And his spouse are informed by Dr. Gerrit FriendsGerkin and they agree with plan.

## 2016-03-30 NOTE — ED Notes (Signed)
[facility] notified at this time.

## 2016-03-30 NOTE — ED Notes (Addendum)
[facility] here and transports him at this time.  He remains in no distress and his nausea is finally relieved with the IV ativan.

## 2016-03-30 NOTE — ED Notes (Signed)
He returns from head/neck CT at this time.  He remains awake and tells me he is uncomfortable and beginning to be nauseated.

## 2016-03-30 NOTE — ED Provider Notes (Signed)
CSN: [id number]     Arrival date & time 03/30/16  1100 History   First MD Initiated Contact with Patient 03/30/16 1100     Chief Complaint  Patient presents with  . Chest Injury     (Consider location/radiation/quality/duration/timing/severity/associated sxs/prior Treatment) HPI Comments: PT comes in with cc of trauma. Pt was working outside and had a tree limb fell on him. He was on the ladder and fell on to the R side due to the limb falling on him. Pt reports pain with breathing and pretty significant L sided chest pain. Denies any blood thinners. No abd pain. No headache /  Trauma and no neck pain, and denies nausea, vomiting, visual complains, seizures, altered mental status, loss of consciousness, new weakness, or numbness, no gait instability. Pt is not on blood thinners.   ROS 10 Systems reviewed and are negative for acute change except as noted in the HPI.      The history is provided by the patient.    Past Medical History  Diagnosis Date  . Chest pain   . Hiatal hernia   . Gallbladder disorder   . Hypercholesteremia    Past Surgical History  Procedure Laterality Date  . Cardiac catheterization    . Thyroidectomy     History reviewed. No pertinent family history. Social History  Substance Use Topics  . Smoking status: Never Smoker   . Smokeless tobacco: None  . Alcohol Use: No    Review of Systems    Allergies  Review of patient's allergies indicates no known allergies.  Home Medications   Prior to Admission medications   Medication Sig Start Date End Date Taking? Authorizing Provider  Multiple Vitamin (MULTIVITAMIN WITH MINERALS) TABS Take 1 tablet by mouth daily.   Yes Historical Provider, MD  NATURE-THROID 97.5 MG TABS Take 97.5-195 mg by mouth 2 (two) times daily. Take 2 tablets in the morning before breakfast and 1 tablet in the afternoon on an empty stomach 03/13/16  Yes Historical Provider, MD  testosterone cypionate (DEPOTESTOSTERONE  CYPIONATE) 200 MG/ML injection Inject 120 mg into the muscle every 14 (fourteen) days.  12/26/15  Yes Historical Provider, MD  triamterene-hydrochlorothiazide (MAXZIDE-25) 37.5-25 MG tablet Take 1 tablet by mouth daily.   Yes Historical Provider, MD  ulipristal acetate (ELLA) 30 MG tablet Take 1 tablet (30 mg total) by mouth once. 03/30/16   Elber Galyean Rhunette Croft, MD   BP 121/72 mmHg  Pulse 90  Temp(Src) 97.8 F (36.6 C) (Oral)  Resp 16  Wt 196 lb (88.905 kg)  SpO2 97% Physical Exam  Constitutional: He is oriented to person, place, and time. He appears well-developed.  HENT:  Head: Atraumatic.  Neck: Neck supple.  No midline c-spine tenderness, pt able to turn head to 45 degrees bilaterally without any pain and able to flex neck to the chest and extend without any pain or neurologic symptoms.   Cardiovascular: Normal rate.   Pulmonary/Chest: Effort normal.  Neurological: He is alert and oriented to person, place, and time.  Skin: Skin is warm.  Nursing note and vitals reviewed.   ED Course  CHEST TUBE INSERTION Date/Time: 03/30/2016 4:52 PM Performed by: Derwood Kaplan Authorized by: Derwood Kaplan Consent: Written consent obtained. Risks and benefits: risks, benefits and alternatives were discussed Consent given by: patient and spouse Patient understanding: patient states understanding of the procedure being performed Patient consent: the patient's understanding of the procedure matches consent given Procedure consent: procedure consent matches procedure scheduled Relevant documents: relevant documents present  and verified Test results: test results available and properly labeled Site marked: the operative site was marked Imaging studies: imaging studies available Required items: required blood products, implants, devices, and special equipment available Patient identity confirmed: arm band Time out: Immediately prior to procedure a "time out" was called to verify the correct  patient, procedure, equipment, support staff and site/side marked as required. Indications: hemopneumothorax Patient sedated: no Anesthesia: local infiltration Local anesthetic: lidocaine 2% without epinephrine Anesthetic total: 20 ml Preparation: skin prepped with ChloraPrep Placement location: left lateral Scalpel size: 10 Tube size: 28 [place] Dissection instrument: finger and Kelly clamp Ultrasound guidance: no Tension pneumothorax heard: no Tube connected to: suction Drainage characteristics: bloody Drainage amount: 200 ml Suture material: 2-0 silk Dressing: 4x4 sterile gauze and petrolatum-impregnated gauze Post-insertion x-ray findings: tube in good position Patient tolerance: Patient tolerated the procedure well with no immediate complications  .Critical Care Performed by: Derwood KaplanNANAVATI, Keyon Liller Authorized by: Derwood KaplanNANAVATI, Rondel Episcopo Total critical care time: 40 minutes Critical care time was exclusive of separately billable procedures and treating other patients. Critical care was necessary to treat or prevent imminent or life-threatening deterioration of the following conditions: trauma. Critical care was time spent personally by me on the following activities: blood draw for specimens, discussions with consultants, development of treatment plan with patient or surrogate, examination of patient, ordering and performing treatments and interventions, ordering and review of radiographic studies, re-evaluation of patient's condition, pulse oximetry, ordering and review of laboratory studies, obtaining history from patient or surrogate and evaluation of patient's response to treatment.   (including critical care time) Labs Review Labs Reviewed  BASIC METABOLIC PANEL - Abnormal; Notable for the following:    Glucose, Bld 132 (*)    All other components within normal limits  I-STAT CHEM 8, ED - Abnormal; Notable for the following:    Glucose, Bld 127 (*)    Calcium, Ion 1.09 (*)     Hemoglobin 17.7 (*)    All other components within normal limits  CBC  PROTIME-INR  I-STAT TROPOININ, ED    Imaging Review Ct Head Wo Contrast  03/30/2016  CLINICAL DATA:  65 year old male with crush injury by tree limb. Initial encounter. EXAM: CT HEAD WITHOUT CONTRAST CT CERVICAL SPINE WITHOUT CONTRAST TECHNIQUE: Multidetector CT imaging of the head and cervical spine was performed following the standard protocol without intravenous contrast. Multiplanar CT image reconstructions of the cervical spine were also generated. COMPARISON:  03/10/2014 brain MR. FINDINGS: CT HEAD FINDINGS Minimal chronic small-vessel white matter ischemic changes again noted. No acute intracranial abnormalities are identified, including mass lesion or mass effect, hydrocephalus, extra-axial fluid collection, midline shift, hemorrhage, or acute infarction. The visualized bony calvarium is unremarkable. Right maxillary sinus mucosal thickening again noted. CT CERVICAL SPINE FINDINGS Normal alignment noted. There is no evidence of acute cervical spine fracture or subluxation. Mild degenerative disc disease at C5-6 noted. Fractures of the left first through third ribs again noted. A small left apical pneumothorax and bilateral subcutaneous/soft tissue emphysema again noted. IMPRESSION: No evidence of acute intracranial abnormality. No static evidence of acute injury to the cervical spine. Fractures of the left first through third ribs, small left apical pneumothorax and bilateral subcutaneous/ soft tissue emphysema again noted. Electronically Signed   By: Harmon PierJeffrey  Hu M.D.   On: 03/30/2016 16:45   Ct Chest W Contrast  03/30/2016  CLINICAL DATA:  Pain after trauma. EXAM: CT CHEST WITH CONTRAST TECHNIQUE: Multidetector CT imaging of the chest was performed during intravenous contrast administration. CONTRAST:  75 mL Isovue 370 COMPARISON:  Chest x-ray earlier today FINDINGS: There is a comminuted displaced left clavicular fracture.  Multiple left-sided rib fractures are identified. There is an acute posterior left rib fracture. A comminuted displaced posterior left third rib fracture is noted as well. A comminuted displaced left second rib fracture is also seen. There is a fracture of the posterior and medial left first rib. There is also a displaced left first rib fracture with discontinuity between fragments. These fractures result in significant deformity of the upper left chest with concavity to the upper left chest wall. There are also fractures through the anterior left first and second costochondral junctions with widening between the costochondral cartilage and manubrium at these levels. The sternum and manubrium are intact as is the right clavicle. The humeri and scapula are also intact bilaterally. Right-sided rib fractures are also seen including the anterior right first rib and the anterior right second rib with deformity of the chest wall in these regions. No thoracic spine fracture is identified. No other fractures are noted. The chest wall injuries result in bilateral anterior pneumothoraces. There is also significant air in the chest wall bilaterally extending into the base of the neck. A smaller amount of air in the mediastinum is likely tracking from the subcutaneous air in the chest wall and neck. Increased attenuation in the superior left pleural space is consistent with blood products. No active extravasation identified. The thoracic aorta demonstrates no aneurysmal dissection. The carotid, subclavian, and vertebral arteries appear to all be intact. Blood products extend into the anterior mediastinum. The pulmonary arteries are normal. Heart is unremarkable. Small bilateral effusions, left greater than right are identified. No adenopathy is identified. The the esophagus is normal in appearance as well. There is a pulmonary contusion deep to the the right anterior second rib fracture with a small pneumatocele. Opacity  anteriorly in the left lung may also represent contusion. Scattered atelectasis. No suspicious pulmonary nodules or masses. More focal opacity in left base could represent atelectasis or aspiration. The central airways are normal. No free air or free fluid seen in the upper abdomen. Evaluation of the upper abdomen is limited but no acute abnormalities are seen. IMPRESSION: 1. Severe trauma to the upper chest bilaterally, left greater than right with numerous fractured and displaced rib fractures resulting in blood products in the left pleural space extending in the anterior mediastinum in addition to bilateral pneumothoraces. The significant air in the subcutaneous tissues of the chest bilaterally extends into the neck, also secondary to barotrauma. The smaller amount of air in the mediastinum is probably tracking from the soft tissue air in the chest wall and neck. There is no convincing evidence of a mediastinal injury to explain the small amount of air in the mediastinum. Pulmonary contusions underlie the rib fractures. Left clavicular fracture. Findings cold to Dr.  Criss Alvine. Electronically Signed   By: Gerome Sam III M.D   On: 03/30/2016 12:48   Ct Cervical Spine Wo Contrast  03/30/2016  CLINICAL DATA:  65 year old male with crush injury by tree limb. Initial encounter. EXAM: CT HEAD WITHOUT CONTRAST CT CERVICAL SPINE WITHOUT CONTRAST TECHNIQUE: Multidetector CT imaging of the head and cervical spine was performed following the standard protocol without intravenous contrast. Multiplanar CT image reconstructions of the cervical spine were also generated. COMPARISON:  03/10/2014 brain MR. FINDINGS: CT HEAD FINDINGS Minimal chronic small-vessel white matter ischemic changes again noted. No acute intracranial abnormalities are identified, including mass lesion or mass effect, hydrocephalus,  extra-axial fluid collection, midline shift, hemorrhage, or acute infarction. The visualized bony calvarium is  unremarkable. Right maxillary sinus mucosal thickening again noted. CT CERVICAL SPINE FINDINGS Normal alignment noted. There is no evidence of acute cervical spine fracture or subluxation. Mild degenerative disc disease at C5-6 noted. Fractures of the left first through third ribs again noted. A small left apical pneumothorax and bilateral subcutaneous/soft tissue emphysema again noted. IMPRESSION: No evidence of acute intracranial abnormality. No static evidence of acute injury to the cervical spine. Fractures of the left first through third ribs, small left apical pneumothorax and bilateral subcutaneous/ soft tissue emphysema again noted. Electronically Signed   By: Harmon Pier M.D.   On: 03/30/2016 16:45   Dg Chest Portable 1 View  03/30/2016  CLINICAL DATA:  Status post chest tube placement. EXAM: PORTABLE CHEST 1 VIEW COMPARISON:  Earlier today FINDINGS: Repositioned left basilar chest tube which overlaps the base. Findings of left anterior pneumothorax with well-defined left mediastinal borders; left chest lucency is increased. No mediastinal shift. There is extensive chest wall gas an upper mid pneumomediastinum. Borderline cardiomegaly. Persistent left lower lobe and perihilar opacity. Known right pneumothorax with no evidence of increase. Extrapleural hemorrhage at the left apex. Left clavicle and multiple bilateral rib fractures as seen on recent chest CT. Masslike opacity over the right chest correlates with pneumatocele on chest CT, with increased size compared to admission. These results were called by telephone at the time of interpretation on 03/30/2016 at 3:19 pm to Dr. Derwood Kaplan , who verbally acknowledged these results. IMPRESSION: 1. Repositioned left chest tube over the base. Increased lucency over the left chest, likely larger anterior pneumothorax. 2. Known pulmonary contusion on the right with modest increase. 3. Known right pneumothorax remains radiographically occult. 4. Left basilar  and perihilar atelectasis that is progressive from admission. Electronically Signed   By: Marnee Spring M.D.   On: 03/30/2016 15:19   Dg Chest Portable 1 View  03/30/2016  CLINICAL DATA:  65 year old male with left thoracostomy tube placement for pneumothorax following injury from tree falling on him. EXAM: PORTABLE CHEST 1 VIEW COMPARISON:  03/30/2016 chest radiograph and CT FINDINGS: A left thoracostomy tube is noted with tip in the left chest wall. Subcutaneous emphysema on the left is identified. A small left apical pneumothorax is identified. Left clavicle and multiple bilateral fractures are again noted as well as increasing left lower lung consolidation/ atelectasis. A small amount of right subcutaneous emphysema is again noted. No other changes are identified. IMPRESSION: Left thoracostomy tube with tip in the left chest wall. Left subcutaneous emphysema and small left apical pneumothorax. Increasing left lower lung consolidation/atelectasis. Left clavicle and multiple bilateral rib fractures again noted. Nurse with this patient was notified these results on 03/30/2016 at 2:30 p.m., and the physician was currently readjusting the chest tube. Electronically Signed   By: Harmon Pier M.D.   On: 03/30/2016 14:38   Dg Chest Port 1 View  03/30/2016  CLINICAL DATA:  Trauma.  Pain. EXAM: PORTABLE CHEST 1 VIEW COMPARISON:  December 2008 and September 2013 FINDINGS: Again noted is a dense nodule in the right upper lobe, unchanged since 2008, of no significance. No other suspicious pulmonary nodules. There is a comminuted mildly displaced mid left clavicular fracture. There is apparent deformity of the second posterior rib on the left. No other definitive rib fractures. There is increased density with a convex border in the lateral left apex adjacent to the possible rib fracture. There is also air  in the subcutaneous tissues of the left chest and neck. No pneumothorax is seen. The heart, hila, and mediastinum are  normal. Surgical clips are seen in the neck. No other abnormalities seen in the chest. IMPRESSION: There is a suggested left posterior second rib fracture. Adjacent convex density in the left lung apex could represent artifact or blood products within the pleural space given the possibility of fracture. There is no definitive pneumothorax but the air in the subcutaneous tissues of the left chest and base of the neck on the left is concerning. There is also a displaced comminuted left clavicular fracture. A CT scan of the chest could better evaluate this region. Findings called to Dr. Rhunette Croft Electronically Signed   By: Gerome Sam III M.D   On: 03/30/2016 11:23   I have personally reviewed and evaluated these images and lab results as part of my medical decision-making.   EKG Interpretation   Date/Time:  Saturday Mar 30 2016 11:13:31 EDT Ventricular Rate:  77 PR Interval:  190 QRS Duration: 74 QT Interval:  367 QTC Calculation: 415 R Axis:   59 Text Interpretation:  Sinus rhythm Left atrial enlargement Baseline wander  in lead(s) V6 No acute changes No significant change since last tracing  Confirmed by Rhunette Croft, MD, Janey Genta [phone]) on 03/30/2016 11:24:53 AM Also  confirmed by Rhunette Croft, MD, Janey Genta [phone]), editor Stout CT, Jola Babinski [phone])   on 03/30/2016 1:14:23 PM      MDM   Final diagnoses:  Pneumothorax on right  Hemopneumothorax on left  Multiple rib fractures involving four or more ribs  Fracture of left clavicle, closed, initial encounter    Pt comes in after being crushed by a tree limb. He has bilateral rib fractures, and a hemopneumothorax on the L side.  Spoke with Dr. Sid Falcon trauma. He authorized placement of the chest tube on the L side.  On the first attempt, we had 100 cc of blood that evacuated with finger-thoracostomy. The tube unfortunately slid up the L subcutaneous region.  We REPLACED the chest tube with 28 Fr tube. We had another 200 cc of blood evacuated.  The tube is not moving superiorly, which is fine since there is hemo-pneumo, but Dr. Andrey Campanile, trauma made aware.  Dr. Rayburn Ma from Orthopedics will see the patient for the clavicle fracture. Pt placed in sling.   Pt tolerated the procedures well and is more comfortable than he was when he first got here.     Derwood Kaplan, MD 03/30/16 1700

## 2016-03-30 NOTE — H&P (Signed)
Brandon [name] is an 65 y.o. male.    General Surgery [facility]  Chief Complaint: fall/crush injury to left shoulder/chest  HPI: patient is a 65 yo WM struck by large tree limb today while standing on ladder approx 4 feet above the ground.  No LOC.  Complains of left shoulder pain, denies neck pain.  Evaluation in ER at [facility] shows clavicle fracture, multiple left rib fractures, hemopneumothorax on left, small apical PTX on right.  No sign of mediastinal or vascular injury.  CT of cervical spine and head pending.  ER physician place left chest tube.  Previous hx notable for HTN and thyroidectomy.  Wife in ER to provide info.  Past Medical History  Diagnosis Date  . Chest pain   . Hiatal hernia   . Gallbladder disorder   . Hypercholesteremia     Past Surgical History  Procedure Laterality Date  . Cardiac catheterization    . Thyroidectomy      History reviewed. No pertinent family history. Social History:  reports that he has never smoked. He does not have any smokeless tobacco history on file. He reports that he does not drink alcohol or use illicit drugs.  Allergies: No Known Allergies   (Not in a hospital admission)  Results for orders placed or performed during the hospital encounter of 03/30/16 (from the past 48 hour(s))  Basic metabolic panel     Status: Abnormal   Collection Time: 03/30/16 11:07 AM  Result Value Ref Range   Sodium 141 135 - 145 mmol/L   Potassium 3.7 3.5 - 5.1 mmol/L   Chloride 104 101 - 111 mmol/L   CO2 26 22 - 32 mmol/L   Glucose, Bld 132 (H) 65 - 99 mg/dL   BUN 12 6 - 20 mg/dL   Creatinine, Ser 0.94 0.61 - 1.24 mg/dL   Calcium 8.9 8.9 - 10.3 mg/dL   GFR calc non Af Amer >60 >60 mL/min   GFR calc Af Amer >60 >60 mL/min    Comment: (NOTE) The eGFR has been calculated using the CKD EPI equation. This calculation has not been validated in all clinical situations. eGFR's persistently <60 mL/min signify possible Chronic  Kidney Disease.    Anion gap 11 5 - 15  CBC     Status: None   Collection Time: 03/30/16 11:07 AM  Result Value Ref Range   WBC 9.7 4.0 - 10.5 K/uL   RBC 5.10 4.22 - 5.81 MIL/uL   Hemoglobin 16.0 13.0 - 17.0 g/dL   HCT 48.5 39.0 - 52.0 %   MCV 95.1 78.0 - 100.0 fL   MCH 31.4 26.0 - 34.0 pg   MCHC 33.0 30.0 - 36.0 g/dL   RDW 13.6 11.5 - 15.5 %   Platelets 237 150 - 400 K/uL  Protime-INR     Status: None   Collection Time: 03/30/16 11:08 AM  Result Value Ref Range   Prothrombin Time 13.9 11.6 - 15.2 seconds   INR 1.10 0.00 - 1.49  I-stat troponin, ED     Status: None   Collection Time: 03/30/16 11:13 AM  Result Value Ref Range   Troponin i, poc 0.00 0.00 - 0.08 ng/mL   Comment 3            Comment: Due to the release kinetics of cTnI, a negative result within the first hours of the onset of symptoms does not rule out myocardial infarction with certainty. If myocardial infarction is still suspected, repeat  the test at appropriate intervals.   I-stat chem 8, ed     Status: Abnormal   Collection Time: 03/30/16 11:30 AM  Result Value Ref Range   Sodium 142 135 - 145 mmol/L   Potassium 3.8 3.5 - 5.1 mmol/L   Chloride 102 101 - 111 mmol/L   BUN 13 6 - 20 mg/dL   Creatinine, Ser 1.00 0.61 - 1.24 mg/dL   Glucose, Bld 127 (H) 65 - 99 mg/dL   Calcium, Ion 1.09 (L) 1.13 - 1.30 mmol/L   TCO2 27 0 - 100 mmol/L   Hemoglobin 17.7 (H) 13.0 - 17.0 g/dL   HCT 52.0 39.0 - 52.0 %   Ct Chest W Contrast  03/30/2016  CLINICAL DATA:  Pain after trauma. EXAM: CT CHEST WITH CONTRAST TECHNIQUE: Multidetector CT imaging of the chest was performed during intravenous contrast administration. CONTRAST:  75 mL Isovue 370 COMPARISON:  Chest x-ray earlier today FINDINGS: There is a comminuted displaced left clavicular fracture. Multiple left-sided rib fractures are identified. There is an acute posterior left rib fracture. A comminuted displaced posterior left third rib fracture is noted as well. A  comminuted displaced left second rib fracture is also seen. There is a fracture of the posterior and medial left first rib. There is also a displaced left first rib fracture with discontinuity between fragments. These fractures result in significant deformity of the upper left chest with concavity to the upper left chest wall. There are also fractures through the anterior left first and second costochondral junctions with widening between the costochondral cartilage and manubrium at these levels. The sternum and manubrium are intact as is the right clavicle. The humeri and scapula are also intact bilaterally. Right-sided rib fractures are also seen including the anterior right first rib and the anterior right second rib with deformity of the chest wall in these regions. No thoracic spine fracture is identified. No other fractures are noted. The chest wall injuries result in bilateral anterior pneumothoraces. There is also significant air in the chest wall bilaterally extending into the base of the neck. A smaller amount of air in the mediastinum is likely tracking from the subcutaneous air in the chest wall and neck. Increased attenuation in the superior left pleural space is consistent with blood products. No active extravasation identified. The thoracic aorta demonstrates no aneurysmal dissection. The carotid, subclavian, and vertebral arteries appear to all be intact. Blood products extend into the anterior mediastinum. The pulmonary arteries are normal. Heart is unremarkable. Small bilateral effusions, left greater than right are identified. No adenopathy is identified. The the esophagus is normal in appearance as well. There is a pulmonary contusion deep to the the right anterior second rib fracture with a small pneumatocele. Opacity anteriorly in the left lung may also represent contusion. Scattered atelectasis. No suspicious pulmonary nodules or masses. More focal opacity in left base could represent  atelectasis or aspiration. The central airways are normal. No free air or free fluid seen in the upper abdomen. Evaluation of the upper abdomen is limited but no acute abnormalities are seen. IMPRESSION: 1. Severe trauma to the upper chest bilaterally, left greater than right with numerous fractured and displaced rib fractures resulting in blood products in the left pleural space extending in the anterior mediastinum in addition to bilateral pneumothoraces. The significant air in the subcutaneous tissues of the chest bilaterally extends into the neck, also secondary to barotrauma. The smaller amount of air in the mediastinum is probably tracking from the soft tissue  air in the chest wall and neck. There is no convincing evidence of a mediastinal injury to explain the small amount of air in the mediastinum. Pulmonary contusions underlie the rib fractures. Left clavicular fracture. Findings cold to Dr.  Regenia Skeeter. Electronically Signed   By: Dorise Bullion III M.D   On: 03/30/2016 12:48   Dg Chest Port 1 View  03/30/2016  CLINICAL DATA:  Trauma.  Pain. EXAM: PORTABLE CHEST 1 VIEW COMPARISON:  December 2008 and September 2013 FINDINGS: Again noted is a dense nodule in the right upper lobe, unchanged since 2008, of no significance. No other suspicious pulmonary nodules. There is a comminuted mildly displaced mid left clavicular fracture. There is apparent deformity of the second posterior rib on the left. No other definitive rib fractures. There is increased density with a convex border in the lateral left apex adjacent to the possible rib fracture. There is also air in the subcutaneous tissues of the left chest and neck. No pneumothorax is seen. The heart, hila, and mediastinum are normal. Surgical clips are seen in the neck. No other abnormalities seen in the chest. IMPRESSION: There is a suggested left posterior second rib fracture. Adjacent convex density in the left lung apex could represent artifact or blood  products within the pleural space given the possibility of fracture. There is no definitive pneumothorax but the air in the subcutaneous tissues of the left chest and base of the neck on the left is concerning. There is also a displaced comminuted left clavicular fracture. A CT scan of the chest could better evaluate this region. Findings called to Dr. Kathrynn Humble Electronically Signed   By: Dorise Bullion III M.D   On: 03/30/2016 11:23    Review of Systems  Constitutional: Negative.   HENT: Negative.   Eyes: Negative.   Respiratory: Negative.   Cardiovascular: Negative.   Gastrointestinal: Negative.   Genitourinary: Negative.   Musculoskeletal: Positive for falls.       Left shoulder and chest pain  Skin: Negative.   Neurological: Negative.   Endo/Heme/Allergies: Negative.   Psychiatric/Behavioral: Negative.     Blood pressure 103/67, pulse 90, temperature 97.8 F (36.6 C), temperature source Oral, resp. rate 20, weight 88.905 kg (196 lb), SpO2 100 %. Physical Exam  Constitutional: He is oriented to person, place, and time. He appears well-developed and well-nourished. He appears distressed.  HENT:  Head: Normocephalic and atraumatic.  Right Ear: External ear normal.  Left Ear: External ear normal.  Eyes: Conjunctivae are normal. Pupils are equal, round, and reactive to light. No scleral icterus.  Neck: Normal range of motion. Neck supple. No tracheal deviation present. No thyromegaly present.  Cardiovascular: Normal rate, regular rhythm and normal heart sounds.   No murmur heard. Respiratory: Effort normal. No respiratory distress. He has no wheezes. He has no rales. He exhibits tenderness (left > right).  Tenderness left anterior chest wall and clavicle with deformity; no crepitus appreciated on exam  GI: Soft. He exhibits no distension. There is no tenderness.  Musculoskeletal: Normal range of motion. He exhibits no edema or tenderness.  Lymphadenopathy:    He has no cervical  adenopathy.  Neurological: He is alert and oriented to person, place, and time.  Skin: Skin is warm and dry. He is not diaphoretic.  Psychiatric: He has a normal mood and affect. His behavior is normal.     Assessment/Plan Fall from 4 feet / crush type injury to left chest wall and clavicle Left clavicle fracture Multiple left rib  fractures including ribs 1-3 Left hemopneumothorax Left pulmonary contusions Hypothyroidism, iatrogenic Hypertension  Agree with left chest tube placement per ER physician  Agree with additional CT scans to evaluate cervical spine and rule out CHI  I have contacted Dr. Greer Pickerel at [facility] who will admit to Trauma Service - requests bed on [address] North or stepdown  May need CT angio to rule out vascular injury - Dr. Redmond Pulling to review and make decision on arrival  [facility] to transfer from [facility] ER to [facility] for admission  Earnstine Regal, MD, [facility] Office: [place], MD 03/30/2016, 2:40 PM

## 2016-03-30 NOTE — ED Notes (Signed)
Per pt, states tree fell on him-chest pain and SOB

## 2016-03-30 NOTE — ED Notes (Signed)
I have just phoned report to Wenda Lowarly, RN at [facility].  [facility] is "tied up"  [facility] are [facility].

## 2016-03-30 NOTE — ED Notes (Signed)
As I write this Dr. Elise BenneNanivati is performing bedside u/s with EKG and portable cxr having already been performed.

## 2016-03-30 NOTE — Sedation Documentation (Signed)
Family updated as to patient's status.

## 2016-03-30 NOTE — Sedation Documentation (Signed)
Dr. Elise BenneNanivati is speaking with pt. And his family as I write this.

## 2016-03-30 NOTE — Sedation Documentation (Signed)
The #28 tube is in and has been confirmed with portable x-ray.  Dr. Elise BenneNanivati is suturing tube in place as I write this.  Pt. Is tolerating all of this quite well.  Pt. Remains oriented to all except time.

## 2016-03-30 NOTE — Consult Note (Signed)
Reason for Consult:  Left clavicle fracture Referring Physician:   Trauma Service  Brandon [name] is an 65 y.o. male.  HPI:   65 yo who sustained chest injuries as well as a left clavicle fracture when a tree he was cutting down fell against him.  He was admitted to the Trauma Service with bilateral rib and lung injuries.  He was also noted to have a left clavicle fracture and Ortho was consulted.  Past Medical History  Diagnosis Date  . Chest pain   . Hiatal hernia   . Gallbladder disorder   . Hypercholesteremia     Past Surgical History  Procedure Laterality Date  . Cardiac catheterization    . Thyroidectomy      History reviewed. No pertinent family history.  Social History:  reports that he has never smoked. He does not have any smokeless tobacco history on file. He reports that he does not drink alcohol or use illicit drugs.  Allergies: No Known Allergies  Medications: I have reviewed the patient's current medications.  Results for orders placed or performed during the hospital encounter of 03/30/16 (from the past 48 hour(s))  Basic metabolic panel     Status: Abnormal   Collection Time: 03/30/16 11:07 AM  Result Value Ref Range   Sodium 141 135 - 145 mmol/L   Potassium 3.7 3.5 - 5.1 mmol/L   Chloride 104 101 - 111 mmol/L   CO2 26 22 - 32 mmol/L   Glucose, Bld 132 (H) 65 - 99 mg/dL   BUN 12 6 - 20 mg/dL   Creatinine, Ser 0.94 0.61 - 1.24 mg/dL   Calcium 8.9 8.9 - 10.3 mg/dL   GFR calc non Af Amer >60 >60 mL/min   GFR calc Af Amer >60 >60 mL/min    Comment: (NOTE) The eGFR has been calculated using the CKD EPI equation. This calculation has not been validated in all clinical situations. eGFR's persistently <60 mL/min signify possible Chronic Kidney Disease.    Anion gap 11 5 - 15  CBC     Status: None   Collection Time: 03/30/16 11:07 AM  Result Value Ref Range   WBC 9.7 4.0 - 10.5 K/uL   RBC 5.10 4.22 - 5.81 MIL/uL   Hemoglobin 16.0 13.0 - 17.0 g/dL   HCT  48.5 39.0 - 52.0 %   MCV 95.1 78.0 - 100.0 fL   MCH 31.4 26.0 - 34.0 pg   MCHC 33.0 30.0 - 36.0 g/dL   RDW 13.6 11.5 - 15.5 %   Platelets 237 150 - 400 K/uL  Protime-INR     Status: None   Collection Time: 03/30/16 11:08 AM  Result Value Ref Range   Prothrombin Time 13.9 11.6 - 15.2 seconds   INR 1.10 0.00 - 1.49  I-stat troponin, ED     Status: None   Collection Time: 03/30/16 11:13 AM  Result Value Ref Range   Troponin i, poc 0.00 0.00 - 0.08 ng/mL   Comment 3            Comment: Due to the release kinetics of cTnI, a negative result within the first hours of the onset of symptoms does not rule out myocardial infarction with certainty. If myocardial infarction is still suspected, repeat the test at appropriate intervals.   I-stat chem 8, ed     Status: Abnormal   Collection Time: 03/30/16 11:30 AM  Result Value Ref Range   Sodium 142 135 - 145 mmol/L   Potassium  3.8 3.5 - 5.1 mmol/L   Chloride 102 101 - 111 mmol/L   BUN 13 6 - 20 mg/dL   Creatinine, Ser 1.00 0.61 - 1.24 mg/dL   Glucose, Bld 127 (H) 65 - 99 mg/dL   Calcium, Ion 1.09 (L) 1.13 - 1.30 mmol/L   TCO2 27 0 - 100 mmol/L   Hemoglobin 17.7 (H) 13.0 - 17.0 g/dL   HCT 52.0 39.0 - 52.0 %    Ct Head Wo Contrast  03/30/2016  CLINICAL DATA:  65 year old male with crush injury by tree limb. Initial encounter. EXAM: CT HEAD WITHOUT CONTRAST CT CERVICAL SPINE WITHOUT CONTRAST TECHNIQUE: Multidetector CT imaging of the head and cervical spine was performed following the standard protocol without intravenous contrast. Multiplanar CT image reconstructions of the cervical spine were also generated. COMPARISON:  03/10/2014 brain MR. FINDINGS: CT HEAD FINDINGS Minimal chronic small-vessel white matter ischemic changes again noted. No acute intracranial abnormalities are identified, including mass lesion or mass effect, hydrocephalus, extra-axial fluid collection, midline shift, hemorrhage, or acute infarction. The visualized bony  calvarium is unremarkable. Right maxillary sinus mucosal thickening again noted. CT CERVICAL SPINE FINDINGS Normal alignment noted. There is no evidence of acute cervical spine fracture or subluxation. Mild degenerative disc disease at C5-6 noted. Fractures of the left first through third ribs again noted. A small left apical pneumothorax and bilateral subcutaneous/soft tissue emphysema again noted. IMPRESSION: No evidence of acute intracranial abnormality. No static evidence of acute injury to the cervical spine. Fractures of the left first through third ribs, small left apical pneumothorax and bilateral subcutaneous/ soft tissue emphysema again noted. Electronically Signed   By: Margarette Canada M.D.   On: 03/30/2016 16:45   Ct Chest W Contrast  03/30/2016  CLINICAL DATA:  Pain after trauma. EXAM: CT CHEST WITH CONTRAST TECHNIQUE: Multidetector CT imaging of the chest was performed during intravenous contrast administration. CONTRAST:  75 mL Isovue 370 COMPARISON:  Chest x-ray earlier today FINDINGS: There is a comminuted displaced left clavicular fracture. Multiple left-sided rib fractures are identified. There is an acute posterior left rib fracture. A comminuted displaced posterior left third rib fracture is noted as well. A comminuted displaced left second rib fracture is also seen. There is a fracture of the posterior and medial left first rib. There is also a displaced left first rib fracture with discontinuity between fragments. These fractures result in significant deformity of the upper left chest with concavity to the upper left chest wall. There are also fractures through the anterior left first and second costochondral junctions with widening between the costochondral cartilage and manubrium at these levels. The sternum and manubrium are intact as is the right clavicle. The humeri and scapula are also intact bilaterally. Right-sided rib fractures are also seen including the anterior right first rib and  the anterior right second rib with deformity of the chest wall in these regions. No thoracic spine fracture is identified. No other fractures are noted. The chest wall injuries result in bilateral anterior pneumothoraces. There is also significant air in the chest wall bilaterally extending into the base of the neck. A smaller amount of air in the mediastinum is likely tracking from the subcutaneous air in the chest wall and neck. Increased attenuation in the superior left pleural space is consistent with blood products. No active extravasation identified. The thoracic aorta demonstrates no aneurysmal dissection. The carotid, subclavian, and vertebral arteries appear to all be intact. Blood products extend into the anterior mediastinum. The pulmonary arteries are normal.  Heart is unremarkable. Small bilateral effusions, left greater than right are identified. No adenopathy is identified. The the esophagus is normal in appearance as well. There is a pulmonary contusion deep to the the right anterior second rib fracture with a small pneumatocele. Opacity anteriorly in the left lung may also represent contusion. Scattered atelectasis. No suspicious pulmonary nodules or masses. More focal opacity in left base could represent atelectasis or aspiration. The central airways are normal. No free air or free fluid seen in the upper abdomen. Evaluation of the upper abdomen is limited but no acute abnormalities are seen. IMPRESSION: 1. Severe trauma to the upper chest bilaterally, left greater than right with numerous fractured and displaced rib fractures resulting in blood products in the left pleural space extending in the anterior mediastinum in addition to bilateral pneumothoraces. The significant air in the subcutaneous tissues of the chest bilaterally extends into the neck, also secondary to barotrauma. The smaller amount of air in the mediastinum is probably tracking from the soft tissue air in the chest wall and neck.  There is no convincing evidence of a mediastinal injury to explain the small amount of air in the mediastinum. Pulmonary contusions underlie the rib fractures. Left clavicular fracture. Findings cold to Dr.  Regenia Skeeter. Electronically Signed   By: Dorise Bullion III M.D   On: 03/30/2016 12:48   Ct Cervical Spine Wo Contrast  03/30/2016  CLINICAL DATA:  65 year old male with crush injury by tree limb. Initial encounter. EXAM: CT HEAD WITHOUT CONTRAST CT CERVICAL SPINE WITHOUT CONTRAST TECHNIQUE: Multidetector CT imaging of the head and cervical spine was performed following the standard protocol without intravenous contrast. Multiplanar CT image reconstructions of the cervical spine were also generated. COMPARISON:  03/10/2014 brain MR. FINDINGS: CT HEAD FINDINGS Minimal chronic small-vessel white matter ischemic changes again noted. No acute intracranial abnormalities are identified, including mass lesion or mass effect, hydrocephalus, extra-axial fluid collection, midline shift, hemorrhage, or acute infarction. The visualized bony calvarium is unremarkable. Right maxillary sinus mucosal thickening again noted. CT CERVICAL SPINE FINDINGS Normal alignment noted. There is no evidence of acute cervical spine fracture or subluxation. Mild degenerative disc disease at C5-6 noted. Fractures of the left first through third ribs again noted. A small left apical pneumothorax and bilateral subcutaneous/soft tissue emphysema again noted. IMPRESSION: No evidence of acute intracranial abnormality. No static evidence of acute injury to the cervical spine. Fractures of the left first through third ribs, small left apical pneumothorax and bilateral subcutaneous/ soft tissue emphysema again noted. Electronically Signed   By: Margarette Canada M.D.   On: 03/30/2016 16:45   Dg Chest Portable 1 View  03/30/2016  CLINICAL DATA:  Status post chest tube placement. EXAM: PORTABLE CHEST 1 VIEW COMPARISON:  Earlier today FINDINGS: Repositioned  left basilar chest tube which overlaps the base. Findings of left anterior pneumothorax with well-defined left mediastinal borders; left chest lucency is increased. No mediastinal shift. There is extensive chest wall gas an upper mid pneumomediastinum. Borderline cardiomegaly. Persistent left lower lobe and perihilar opacity. Known right pneumothorax with no evidence of increase. Extrapleural hemorrhage at the left apex. Left clavicle and multiple bilateral rib fractures as seen on recent chest CT. Masslike opacity over the right chest correlates with pneumatocele on chest CT, with increased size compared to admission. These results were called by telephone at the time of interpretation on 03/30/2016 at 3:19 pm to Dr. Varney Biles , who verbally acknowledged these results. IMPRESSION: 1. Repositioned left chest tube over the base.  Increased lucency over the left chest, likely larger anterior pneumothorax. 2. Known pulmonary contusion on the right with modest increase. 3. Known right pneumothorax remains radiographically occult. 4. Left basilar and perihilar atelectasis that is progressive from admission. Electronically Signed   By: Monte Fantasia M.D.   On: 03/30/2016 15:19   Dg Chest Portable 1 View  03/30/2016  CLINICAL DATA:  65 year old male with left thoracostomy tube placement for pneumothorax following injury from tree falling on him. EXAM: PORTABLE CHEST 1 VIEW COMPARISON:  03/30/2016 chest radiograph and CT FINDINGS: A left thoracostomy tube is noted with tip in the left chest wall. Subcutaneous emphysema on the left is identified. A small left apical pneumothorax is identified. Left clavicle and multiple bilateral fractures are again noted as well as increasing left lower lung consolidation/ atelectasis. A small amount of right subcutaneous emphysema is again noted. No other changes are identified. IMPRESSION: Left thoracostomy tube with tip in the left chest wall. Left subcutaneous emphysema and small  left apical pneumothorax. Increasing left lower lung consolidation/atelectasis. Left clavicle and multiple bilateral rib fractures again noted. Nurse with this patient was notified these results on 03/30/2016 at 2:30 p.m., and the physician was currently readjusting the chest tube. Electronically Signed   By: Margarette Canada M.D.   On: 03/30/2016 14:38   Dg Chest Port 1 View  03/30/2016  CLINICAL DATA:  Trauma.  Pain. EXAM: PORTABLE CHEST 1 VIEW COMPARISON:  December 2008 and September 2013 FINDINGS: Again noted is a dense nodule in the right upper lobe, unchanged since 2008, of no significance. No other suspicious pulmonary nodules. There is a comminuted mildly displaced mid left clavicular fracture. There is apparent deformity of the second posterior rib on the left. No other definitive rib fractures. There is increased density with a convex border in the lateral left apex adjacent to the possible rib fracture. There is also air in the subcutaneous tissues of the left chest and neck. No pneumothorax is seen. The heart, hila, and mediastinum are normal. Surgical clips are seen in the neck. No other abnormalities seen in the chest. IMPRESSION: There is a suggested left posterior second rib fracture. Adjacent convex density in the left lung apex could represent artifact or blood products within the pleural space given the possibility of fracture. There is no definitive pneumothorax but the air in the subcutaneous tissues of the left chest and base of the neck on the left is concerning. There is also a displaced comminuted left clavicular fracture. A CT scan of the chest could better evaluate this region. Findings called to Dr. Kathrynn Humble Electronically Signed   By: Dorise Bullion III M.D   On: 03/30/2016 11:23    ROS Blood pressure 127/71, pulse 89, temperature 98.4 F (36.9 C), temperature source Oral, resp. rate 16, height '6\' 1"'  (1.854 m), weight 91.264 kg (201 lb 3.2 oz), SpO2 97 %. Physical Exam   Constitutional: He is oriented to person, place, and time. He appears well-developed.  Neck: Normal range of motion. Neck supple.  Musculoskeletal:       Left shoulder: He exhibits decreased range of motion, tenderness, swelling and deformity.  Neurological: He is alert and oriented to person, place, and time.  He does hurt to palpation over his left clavicle. There is no soft-tissue compromise and no significant shortening. He moves his left wrist and hand easily, has normal sensation in his hand and a well-perfused hand.  Assessment/Plan: Left clavicle fracture 1)  I spoke to him and his  wife in detail about his left clavicle injury.  I will treat this non-operatively for now given his chest injuries.  I gave them my card and can follow him up as an outpatient.  Mcarthur Rossetti 03/30/2016, 10:38 PM

## 2016-03-30 NOTE — ED Notes (Signed)
He returns from CT now and states he feels "a lot better"  He is no longer diaphoretic.

## 2016-03-31 ENCOUNTER — Inpatient Hospital Stay (HOSPITAL_COMMUNITY): Payer: 59

## 2016-03-31 MED ORDER — POLYETHYLENE GLYCOL 3350 17 G PO PACK
17.0000 g | PACK | Freq: Every day | ORAL | Status: DC
  Administered 2016-03-31 – 2016-04-04 (×4): 17 g via ORAL
  Filled 2016-03-31 (×5): qty 1

## 2016-03-31 MED ORDER — THYROID 97.5 MG PO TABS: 97.5000 mg | ORAL_TABLET | Freq: Two times a day (BID) | ORAL | Status: DC

## 2016-03-31 MED ORDER — ENOXAPARIN SODIUM 40 MG/0.4ML ~~LOC~~ SOLN
40.0000 mg | SUBCUTANEOUS | Status: DC
  Administered 2016-03-31 – 2016-04-01 (×2): 40 mg via SUBCUTANEOUS
  Filled 2016-03-31 (×2): qty 0.4

## 2016-03-31 MED ORDER — THYROID 30 MG PO TABS
195.0000 mg | ORAL_TABLET | Freq: Every day | ORAL | Status: DC
  Administered 2016-03-31 – 2016-04-07 (×6): 195 mg via ORAL
  Filled 2016-03-31 (×11): qty 1

## 2016-03-31 MED ORDER — THYROID 60 MG PO TABS
90.0000 mg | ORAL_TABLET | ORAL | Status: DC
  Administered 2016-03-31 – 2016-04-07 (×8): 90 mg via ORAL
  Filled 2016-03-31 (×9): qty 1

## 2016-03-31 MED ORDER — DOCUSATE SODIUM 100 MG PO CAPS
100.0000 mg | ORAL_CAPSULE | Freq: Two times a day (BID) | ORAL | Status: DC
  Administered 2016-03-31 – 2016-04-01 (×3): 100 mg via ORAL
  Filled 2016-03-31 (×3): qty 1

## 2016-03-31 MED ORDER — OXYCODONE HCL 5 MG PO TABS
10.0000 mg | ORAL_TABLET | ORAL | Status: DC | PRN
  Administered 2016-03-31: 15 mg via ORAL
  Administered 2016-04-01: 20 mg via ORAL
  Administered 2016-04-01: 15 mg via ORAL
  Filled 2016-03-31: qty 3
  Filled 2016-03-31: qty 4
  Filled 2016-03-31: qty 3

## 2016-03-31 NOTE — Progress Notes (Signed)
Pt voided only 50cc since last night , bladder scan done with >200 result, pt verbalized having difficulty voiding, reported to Dr. Andrey CampanileWilson with order to insert foley catheter

## 2016-03-31 NOTE — Progress Notes (Signed)
Patient ID: Brandon [name], male   DOB: 04/11/1951, 65 y.o.   MRN: [id number]  LOS: 1 day   Subjective: No sob C/o pain.  No IS added. Had urinary retention, foley placed. Vss. Afebrile. Tele okay.   ct output Objective: Vital signs in last 24 hours: Temp:  [97.8 F (36.6 C)-98.9 F (37.2 C)] 98.8 F (37.1 C) (05/07 0534) Pulse Rate:  [65-99] 87 (05/07 0534) Resp:  [16-24] 16 (05/07 0534) BP: (97-163)/(58-89) 120/79 mmHg (05/07 0534) SpO2:  [96 %-100 %] 97 % (05/07 0534) Weight:  [88.905 kg (196 lb)-91.264 kg (201 lb 3.2 oz)] 91.264 kg (201 lb 3.2 oz) (05/06 1900)    Lab Results:  CBC  Recent Labs  03/30/16 1107 03/30/16 1130  WBC 9.7  --   HGB 16.0 17.7*  HCT 48.5 52.0  PLT 237  --    BMET  Recent Labs  03/30/16 1107 03/30/16 1130  NA 141 142  K 3.7 3.8  CL 104 102  CO2 26  --   GLUCOSE 132* 127*  BUN 12 13  CREATININE 0.94 1.00  CALCIUM 8.9  --     Imaging: Ct Head Wo Contrast  03/30/2016  CLINICAL DATA:  65 year old male with crush injury by tree limb. Initial encounter. EXAM: CT HEAD WITHOUT CONTRAST CT CERVICAL SPINE WITHOUT CONTRAST TECHNIQUE: Multidetector CT imaging of the head and cervical spine was performed following the standard protocol without intravenous contrast. Multiplanar CT image reconstructions of the cervical spine were also generated. COMPARISON:  03/10/2014 brain MR. FINDINGS: CT HEAD FINDINGS Minimal chronic small-vessel white matter ischemic changes again noted. No acute intracranial abnormalities are identified, including mass lesion or mass effect, hydrocephalus, extra-axial fluid collection, midline shift, hemorrhage, or acute infarction. The visualized bony calvarium is unremarkable. Right maxillary sinus mucosal thickening again noted. CT CERVICAL SPINE FINDINGS Normal alignment noted. There is no evidence of acute cervical spine fracture or subluxation. Mild degenerative disc disease at C5-6 noted. Fractures of the left first  through third ribs again noted. A small left apical pneumothorax and bilateral subcutaneous/soft tissue emphysema again noted. IMPRESSION: No evidence of acute intracranial abnormality. No static evidence of acute injury to the cervical spine. Fractures of the left first through third ribs, small left apical pneumothorax and bilateral subcutaneous/ soft tissue emphysema again noted. Electronically Signed   By: Harmon Pier M.D.   On: 03/30/2016 16:45   Ct Chest W Contrast  03/30/2016  CLINICAL DATA:  Pain after trauma. EXAM: CT CHEST WITH CONTRAST TECHNIQUE: Multidetector CT imaging of the chest was performed during intravenous contrast administration. CONTRAST:  75 mL Isovue 370 COMPARISON:  Chest x-ray earlier today FINDINGS: There is a comminuted displaced left clavicular fracture. Multiple left-sided rib fractures are identified. There is an acute posterior left rib fracture. A comminuted displaced posterior left third rib fracture is noted as well. A comminuted displaced left second rib fracture is also seen. There is a fracture of the posterior and medial left first rib. There is also a displaced left first rib fracture with discontinuity between fragments. These fractures result in significant deformity of the upper left chest with concavity to the upper left chest wall. There are also fractures through the anterior left first and second costochondral junctions with widening between the costochondral cartilage and manubrium at these levels. The sternum and manubrium are intact as is the right clavicle. The humeri and scapula are also intact bilaterally. Right-sided rib fractures are also seen including the anterior right first  rib and the anterior right second rib with deformity of the chest wall in these regions. No thoracic spine fracture is identified. No other fractures are noted. The chest wall injuries result in bilateral anterior pneumothoraces. There is also significant air in the chest wall  bilaterally extending into the base of the neck. A smaller amount of air in the mediastinum is likely tracking from the subcutaneous air in the chest wall and neck. Increased attenuation in the superior left pleural space is consistent with blood products. No active extravasation identified. The thoracic aorta demonstrates no aneurysmal dissection. The carotid, subclavian, and vertebral arteries appear to all be intact. Blood products extend into the anterior mediastinum. The pulmonary arteries are normal. Heart is unremarkable. Small bilateral effusions, left greater than right are identified. No adenopathy is identified. The the esophagus is normal in appearance as well. There is a pulmonary contusion deep to the the right anterior second rib fracture with a small pneumatocele. Opacity anteriorly in the left lung may also represent contusion. Scattered atelectasis. No suspicious pulmonary nodules or masses. More focal opacity in left base could represent atelectasis or aspiration. The central airways are normal. No free air or free fluid seen in the upper abdomen. Evaluation of the upper abdomen is limited but no acute abnormalities are seen. IMPRESSION: 1. Severe trauma to the upper chest bilaterally, left greater than right with numerous fractured and displaced rib fractures resulting in blood products in the left pleural space extending in the anterior mediastinum in addition to bilateral pneumothoraces. The significant air in the subcutaneous tissues of the chest bilaterally extends into the neck, also secondary to barotrauma. The smaller amount of air in the mediastinum is probably tracking from the soft tissue air in the chest wall and neck. There is no convincing evidence of a mediastinal injury to explain the small amount of air in the mediastinum. Pulmonary contusions underlie the rib fractures. Left clavicular fracture. Findings cold to Dr.  Criss AlvineGoldston. Electronically Signed   By: Gerome Samavid  Williams III M.D    On: 03/30/2016 12:48   Ct Cervical Spine Wo Contrast  03/30/2016  CLINICAL DATA:  65 year old male with crush injury by tree limb. Initial encounter. EXAM: CT HEAD WITHOUT CONTRAST CT CERVICAL SPINE WITHOUT CONTRAST TECHNIQUE: Multidetector CT imaging of the head and cervical spine was performed following the standard protocol without intravenous contrast. Multiplanar CT image reconstructions of the cervical spine were also generated. COMPARISON:  03/10/2014 brain MR. FINDINGS: CT HEAD FINDINGS Minimal chronic small-vessel white matter ischemic changes again noted. No acute intracranial abnormalities are identified, including mass lesion or mass effect, hydrocephalus, extra-axial fluid collection, midline shift, hemorrhage, or acute infarction. The visualized bony calvarium is unremarkable. Right maxillary sinus mucosal thickening again noted. CT CERVICAL SPINE FINDINGS Normal alignment noted. There is no evidence of acute cervical spine fracture or subluxation. Mild degenerative disc disease at C5-6 noted. Fractures of the left first through third ribs again noted. A small left apical pneumothorax and bilateral subcutaneous/soft tissue emphysema again noted. IMPRESSION: No evidence of acute intracranial abnormality. No static evidence of acute injury to the cervical spine. Fractures of the left first through third ribs, small left apical pneumothorax and bilateral subcutaneous/ soft tissue emphysema again noted. Electronically Signed   By: Harmon PierJeffrey  Hu M.D.   On: 03/30/2016 16:45   Dg Chest Port 1 View  03/31/2016  CLINICAL DATA:  Patient with history of left-sided pneumothorax. Recent trauma. Multiple rib fractures. EXAM: PORTABLE CHEST 1 VIEW COMPARISON:  Chest radiograph  03/30/2016. FINDINGS: Multiple monitoring leads overlie the patient. Stable cardiac and mediastinal contours. Left chest tube remains in place. Re- demonstrated small left pneumothorax. Presumed small amount of pneumomediastinum. Slight  interval improvement left chest wall soft tissues subcutaneous emphysema. Radiographically occult known right pneumothorax. Extrapleural hemorrhage at the left apex. Interval increase left mid and lower lung airspace opacities. Left clavicle and multiple left-sided rib fractures. Grossly unchanged opacity right mid to upper lung compatible pneumatocele and probable contusion on prior CT. IMPRESSION: Left chest tube remains in place. Grossly unchanged left pneumothorax. Known small right pneumothorax, radiographically occult. Increased left perihilar and basilar opacities favored represent atelectasis. Electronically Signed   By: Annia Belt M.D.   On: 03/31/2016 08:56   Dg Chest Portable 1 View  03/30/2016  CLINICAL DATA:  Status post chest tube placement. EXAM: PORTABLE CHEST 1 VIEW COMPARISON:  Earlier today FINDINGS: Repositioned left basilar chest tube which overlaps the base. Findings of left anterior pneumothorax with well-defined left mediastinal borders; left chest lucency is increased. No mediastinal shift. There is extensive chest wall gas an upper mid pneumomediastinum. Borderline cardiomegaly. Persistent left lower lobe and perihilar opacity. Known right pneumothorax with no evidence of increase. Extrapleural hemorrhage at the left apex. Left clavicle and multiple bilateral rib fractures as seen on recent chest CT. Masslike opacity over the right chest correlates with pneumatocele on chest CT, with increased size compared to admission. These results were called by telephone at the time of interpretation on 03/30/2016 at 3:19 pm to Dr. Derwood Kaplan , who verbally acknowledged these results. IMPRESSION: 1. Repositioned left chest tube over the base. Increased lucency over the left chest, likely larger anterior pneumothorax. 2. Known pulmonary contusion on the right with modest increase. 3. Known right pneumothorax remains radiographically occult. 4. Left basilar and perihilar atelectasis that is  progressive from admission. Electronically Signed   By: Marnee Spring M.D.   On: 03/30/2016 15:19   Dg Chest Portable 1 View  03/30/2016  CLINICAL DATA:  65 year old male with left thoracostomy tube placement for pneumothorax following injury from tree falling on him. EXAM: PORTABLE CHEST 1 VIEW COMPARISON:  03/30/2016 chest radiograph and CT FINDINGS: A left thoracostomy tube is noted with tip in the left chest wall. Subcutaneous emphysema on the left is identified. A small left apical pneumothorax is identified. Left clavicle and multiple bilateral fractures are again noted as well as increasing left lower lung consolidation/ atelectasis. A small amount of right subcutaneous emphysema is again noted. No other changes are identified. IMPRESSION: Left thoracostomy tube with tip in the left chest wall. Left subcutaneous emphysema and small left apical pneumothorax. Increasing left lower lung consolidation/atelectasis. Left clavicle and multiple bilateral rib fractures again noted. Nurse with this patient was notified these results on 03/30/2016 at 2:30 p.m., and the physician was currently readjusting the chest tube. Electronically Signed   By: Harmon Pier M.D.   On: 03/30/2016 14:38   Dg Chest Port 1 View  03/30/2016  CLINICAL DATA:  Trauma.  Pain. EXAM: PORTABLE CHEST 1 VIEW COMPARISON:  December 2008 and September 2013 FINDINGS: Again noted is a dense nodule in the right upper lobe, unchanged since 2008, of no significance. No other suspicious pulmonary nodules. There is a comminuted mildly displaced mid left clavicular fracture. There is apparent deformity of the second posterior rib on the left. No other definitive rib fractures. There is increased density with a convex border in the lateral left apex adjacent to the possible rib fracture. There is  also air in the subcutaneous tissues of the left chest and neck. No pneumothorax is seen. The heart, hila, and mediastinum are normal. Surgical clips are seen  in the neck. No other abnormalities seen in the chest. IMPRESSION: There is a suggested left posterior second rib fracture. Adjacent convex density in the left lung apex could represent artifact or blood products within the pleural space given the possibility of fracture. There is no definitive pneumothorax but the air in the subcutaneous tissues of the left chest and base of the neck on the left is concerning. There is also a displaced comminuted left clavicular fracture. A CT scan of the chest could better evaluate this region. Findings called to Dr. Rhunette Croft Electronically Signed   By: Gerome Sam III M.D   On: 03/30/2016 11:23     PE: General appearance: alert, cooperative and no distress Back: symmetric, no curvature. ROM normal. No CVA tenderness. Cardio: regular rate and rhythm, S1, S2 normal, no murmur, click, rub or gallop GI: soft, non-tender; bowel sounds normal; no masses,  no organomegaly Extremities: left clavile swelling and ecchymosis.  Lungs: cts.  Left ct in place no air leak.  Serosanguinous output.  Audible pop and deformity to chest wall.      Patient Active Problem List   Diagnosis Date Noted  . Hemopneumothorax on left 03/30/2016  . Fracture of left clavicle 03/30/2016  . Multiple fractures of ribs of left side 03/30/2016  . Left pulmonary contusion 03/30/2016  . Hemopneumothorax, left 03/30/2016  . Hiatal hernia   . Gallbladder disorder   . Hypercholesteremia   . Chest pain 08/06/2012  . Elevated BP 08/06/2012    Assessment/Plan: Fall from 4 ft/crush injury Multiple left and right rib fractures//PTX/pulmonary contusion-continue CT to suction.  CXR in AM.  No air leak.  Pt has audible "pop" on the right side, monitor.  May need TCTS consult Left clavicle fracture-non op per Dr. Magnus Ivan VTE - SCD's, Lovenox  Urinary retention-continue foley Hypothyroidism-home meds FEN - advance diet Dispo -- floor. PT/OT   Ashok Norris, ANP-BC Pager:  [phone] General Trauma PA Pager: [phone]   03/31/2016 9:43 AM

## 2016-03-31 NOTE — Evaluation (Signed)
Physical Therapy Evaluation Patient Details Name: Brandon [name] MRN: [id number] DOB: [birth year] Today's Date: 03/31/2016   History of Present Illness  HPI: patient is a [age] yo WM struck by large tree limb today while standing on ladder approx 4 feet above the ground. No LOC. Complains of left shoulder pain, denies neck pain. [facility], multiple left rib fractures, hemopneumothorax on left, small apical PTX on right.  Clinical Impression   Pt admitted with above diagnosis. Pt currently with functional limitations due to the deficits listed below (see PT Problem List).  Pt will benefit from skilled PT to increase their independence and safety with mobility to allow discharge to the venue listed below.       Follow Up Recommendations Outpatient PT;Supervision - Intermittent  The potential need for Outpatient PT can be addressed at Ortho follow-up appointments.     Equipment Recommendations  None recommended by PT    Recommendations for Other Services OT consult     Precautions / Restrictions Precautions Precaution Comments: Chest tube Restrictions Weight Bearing Restrictions: Yes LUE Weight Bearing:  (will proceed NWB unless otherwise ordered)      Mobility  Bed Mobility Overal bed mobility: Needs Assistance Bed Mobility: Supine to Sit (via semi-roll to Right)     Supine to sit: Mod assist     General bed mobility comments: mod assist to clear LEs from EOB, and to elevate trunk (support given at R shoulder/scapula)  Transfers Overall transfer level: Needs assistance Equipment used: 1 person hand held assist Transfers: Sit to/from Stand Sit to Stand: Min assist         General transfer comment: Min assist to steady during sit<>stand; support given at R elbow/shoulder girdle  Ambulation/Gait Ambulation/Gait assistance: Min assist Ambulation Distance (Feet):  [facility] to [facility]) Assistive device: 1 person  hand held assist       General Gait Details: Cues to self-monitor for activity tolerance; support given at [facility] [facility]  Stairs            Wheelchair Mobility    Modified Rankin (Stroke Patients Only)       Balance                                             Pertinent Vitals/Pain Pain Assessment: Faces Faces Pain Scale: Hurts whole lot Pain Location: Mostly L clavicle and ribs Pain Descriptors / Indicators: Aching;Grimacing;Guarding Pain Intervention(s): Limited activity within patient's tolerance;Monitored during session;Premedicated before session;Repositioned    Home Living Family/patient expects to be discharged to:: Private residence Living Arrangements: Spouse/significant other Available Help at Discharge: Family;Available 24 hours/day Type of Home: House Home Access: Stairs to enter Entrance Stairs-Rails: Right Entrance Stairs-Number of Steps: 5 Home Layout: One level Home Equipment: None      Prior Function Level of Independence: Independent               Hand Dominance        Extremity/Trunk Assessment   Upper Extremity Assessment: Defer to OT evaluation (LUE in sling)           Lower Extremity Assessment: Overall WFL for tasks assessed (Reports feeling shaky, but noted good rise to stand)         Communication   Communication: No difficulties  Cognition Arousal/Alertness: Awake/alert Behavior During Therapy: WFL for tasks assessed/performed  Overall Cognitive Status: Within Functional Limits for tasks assessed                      General Comments General comments (skin integrity, edema, etc.): O2 sats 97-100% on 4L supplemental L2; encouraged deep breathing    Exercises        Assessment/Plan    PT Assessment Patient needs continued PT services  PT Diagnosis Difficulty walking;Acute pain   PT Problem List Decreased activity tolerance;Decreased balance;Decreased mobility  PT Treatment Interventions  Gait training;Stair training;Functional mobility training;Therapeutic activities;Therapeutic exercise;Balance training;Patient/family education   PT Goals (Current goals can be found in the Care Plan section) Acute Rehab PT Goals Patient Stated Goal: Hopes to heal and have less pain soon PT Goal Formulation: With patient Time For Goal Achievement: 04/14/16 Potential to Achieve Goals: Good    Frequency Min 5X/week   Barriers to discharge        Co-evaluation               End of Session   Activity Tolerance: Patient tolerated treatment well Patient left: in chair;with call bell/phone within reach;with family/visitor present Nurse Communication: Mobility status         Time: 1610-9604 PT Time Calculation (min) (ACUTE ONLY): 23 min   Charges:   PT Evaluation $PT Eval Moderate Complexity: 1 Procedure PT Treatments $Therapeutic Activity: 8-22 mins   PT G Codes:        Olen Pel 03/31/2016, 5:39 PM  Van Clines, PT  Acute Rehabilitation Services Pager [phone] Office [phone]

## 2016-04-01 ENCOUNTER — Inpatient Hospital Stay (HOSPITAL_COMMUNITY): Payer: 59

## 2016-04-01 LAB — CBC
HCT: 38.3 % — ABNORMAL LOW (ref 39.0–52.0)
HEMOGLOBIN: 12.4 g/dL — AB (ref 13.0–17.0)
MCH: 31.8 pg (ref 26.0–34.0)
MCHC: 32.4 g/dL (ref 30.0–36.0)
MCV: 98.2 fL (ref 78.0–100.0)
PLATELETS: 146 10*3/uL — AB (ref 150–400)
RBC: 3.9 MIL/uL — AB (ref 4.22–5.81)
RDW: 13.8 % (ref 11.5–15.5)
WBC: 7.1 10*3/uL (ref 4.0–10.5)

## 2016-04-01 LAB — BASIC METABOLIC PANEL
ANION GAP: 10 (ref 5–15)
BUN: 8 mg/dL (ref 6–20)
CO2: 24 mmol/L (ref 22–32)
CREATININE: 0.8 mg/dL (ref 0.61–1.24)
Calcium: 7.8 mg/dL — ABNORMAL LOW (ref 8.9–10.3)
Chloride: 103 mmol/L (ref 101–111)
GFR calc non Af Amer: >60 mL/min (ref >60)
Glucose, Bld: 121 mg/dL — ABNORMAL HIGH (ref 65–99)
POTASSIUM: 4.4 mmol/L (ref 3.5–5.1)
SODIUM: 137 mmol/L (ref 135–145)

## 2016-04-01 MED ORDER — OXYCODONE HCL 5 MG PO TABS
10.0000 mg | ORAL_TABLET | ORAL | Status: DC | PRN
  Administered 2016-04-01 (×3): 20 mg via ORAL
  Filled 2016-04-01 (×3): qty 4
  Filled 2016-04-01: qty 2
  Filled 2016-04-01: qty 4

## 2016-04-01 MED ORDER — BETHANECHOL CHLORIDE 25 MG PO TABS
25.0000 mg | ORAL_TABLET | Freq: Four times a day (QID) | ORAL | Status: DC
  Administered 2016-04-01 (×4): 25 mg via ORAL
  Filled 2016-04-01 (×4): qty 1

## 2016-04-01 MED ORDER — HYDROMORPHONE HCL 1 MG/ML IJ SOLN
0.5000 mg | INTRAMUSCULAR | Status: DC | PRN
  Administered 2016-04-02 (×3): 0.5 mg via INTRAVENOUS
  Filled 2016-04-01 (×3): qty 1

## 2016-04-01 MED ORDER — DOCUSATE SODIUM 100 MG PO CAPS
200.0000 mg | ORAL_CAPSULE | Freq: Two times a day (BID) | ORAL | Status: DC
  Administered 2016-04-01 – 2016-04-07 (×12): 200 mg via ORAL
  Filled 2016-04-01 (×12): qty 2

## 2016-04-01 MED ORDER — ENOXAPARIN SODIUM 30 MG/0.3ML ~~LOC~~ SOLN
30.0000 mg | Freq: Two times a day (BID) | SUBCUTANEOUS | Status: DC
  Administered 2016-04-01 – 2016-04-07 (×12): 30 mg via SUBCUTANEOUS
  Filled 2016-04-01 (×12): qty 0.3

## 2016-04-01 MED ORDER — TAMSULOSIN HCL 0.4 MG PO CAPS
0.4000 mg | ORAL_CAPSULE | Freq: Every day | ORAL | Status: DC
  Administered 2016-04-01 – 2016-04-02 (×2): 0.4 mg via ORAL
  Filled 2016-04-01 (×2): qty 1

## 2016-04-01 NOTE — Progress Notes (Signed)
PT Cancellation Note  Patient Details Name: Brandon [name] MRN: [id number] DOB: Mar 25, 1951   Cancelled Treatment:    Reason Eval/Treat Not Completed: Patient at procedure or test/unavailable (pt currently working with OT will reattempt)   Toney Sangabor, Ayrianna Mcginniss Beth 04/01/2016, 11:33 AM Delaney MeigsMaija Tabor Pearlena Ow, PT [phone]

## 2016-04-01 NOTE — Progress Notes (Signed)
Patient ID: Brandon [name], male   DOB: [birth year], 65 y.o.   MRN: [id number]   LOS: 2 days   Subjective: No unexpected c/o.   Objective: Vital signs in last 24 hours: Temp:  [98 F (36.7 C)-98.7 F (37.1 C)] 98.7 F (37.1 C) (05/08 0600) Pulse Rate:  [84-88] 86 (05/08 0600) Resp:  [17-18] 18 (05/08 0600) BP: (119-131)/(67-77) 119/67 mmHg (05/08 0600) SpO2:  [96 %-100 %] 98 % (05/08 0600)    IS: 1000ml   CT No air leak 21575ml/24h @730ml    Laboratory  CBC  Recent Labs  03/30/16 1107 03/30/16 1130 04/01/16 0352  WBC 9.7  --  7.1  HGB 16.0 17.7* 12.4*  HCT 48.5 52.0 38.3*  PLT 237  --  146*   BMET  Recent Labs  03/30/16 1107 03/30/16 1130 04/01/16 0352  NA 141 142 137  K 3.7 3.8 4.4  CL 104 102 103  CO2 26  --  24  GLUCOSE 132* 127* 121*  BUN 12 13 8   CREATININE 0.94 1.00 0.80  CALCIUM 8.9  --  7.8*    Radiology Results CXR: Pending   Physical Exam General appearance: alert and no distress Resp: clear to auscultation bilaterally Cardio: regular rate and rhythm GI: normal findings: bowel sounds normal and soft, non-tender Extremities: NVI   Assessment/Plan: Fall from 4 ft/crush injury Multiple left and right rib fractures//PTX/pulmonary contusion-continue CT to suction. CXR in AM. No air leak. Pt has audible "pop" on the right side, monitor. May need TCTS consult Left clavicle fracture-non op per Dr. Magnus IvanBlackman ABL anemia -- Mild Urinary retention-continue foley, add Flomax/urecholine, voiding trial tomorrow Hypothyroidism-home meds FEN - advance diet VTE - SCD's, Lovenox  Dispo -- PT/OT    Freeman CaldronMichael J. Domonic Kimball, PA-C Pager: [phone] General Trauma PA Pager: [phone]  04/01/2016

## 2016-04-01 NOTE — Progress Notes (Signed)
Occupational Therapy Evaluation Patient Details Name: Brandon [name] MRN: [id number] DOB: 1951/09/03 Today's Date: 04/01/2016    History of Present Illness HPI: patient is a 65 yo WM struck by large tree limb today while standing on ladder approx 4 feet above the ground. No LOC. Complains of left shoulder pain, denies neck pain. Evaluation in ER at [facility] shows clavicle fracture, multiple left rib fractures, hemopneumothorax on left, small apical PTX on right.   Clinical Impression   PTA, pt was independent with ADLs and mobility. Pt currently requires max assist for bathing/dressing tasks and min guard-min assist for functional transfers and ambulation. Began education on LUE precautions, sling wear protocol, positioning for rest, and compensatory strategies for UB ADLs. Pt plans to d/c home with 24/7 assistance from his wife and other family members. Pt will benefit from continued acute OT to increase independence and safety with ADLs and mobility to allow for safe discharge home. Recommend OP OT at MD discretion and LUE rehab and 3in1 for home use.    Follow Up Recommendations  Outpatient OT;Supervision/Assistance - 24 hour    Equipment Recommendations  3 in 1 bedside comode    Recommendations for Other Services       Precautions / Restrictions Precautions Precautions: Fall Precaution Comments: Chest tube Required Braces or Orthoses: Sling Restrictions Weight Bearing Restrictions: Yes LUE Weight Bearing:  (will proceed NWB unless otherwise ordered)      Mobility Bed Mobility               General bed mobility comments: Pt up in chair on OT evaluation.  Transfers Overall transfer level: Needs assistance Equipment used: None Transfers: Sit to/from Stand Sit to Stand: Min guard         General transfer comment: Min guard for safety and balance. Verbal cues for safety including safe hand placement on seated surface. Pt required increased cues to slow down and  wait for assistance with line management.    Balance Overall balance assessment: Needs assistance Sitting-balance support: No upper extremity supported;Feet supported Sitting balance-Leahy Scale: Fair Sitting balance - Comments: Limited due to pain   Standing balance support: No upper extremity supported;During functional activity Standing balance-Leahy Scale: Fair Standing balance comment: Limited due to pain                            ADL Overall ADL's : Needs assistance/impaired         Upper Body Bathing: Maximal assistance;Sitting;Cueing for compensatory techniques Upper Body Bathing Details (indicate cue type and reason): Cues for technique with thin wash cloth Lower Body Bathing: Maximal assistance;Sit to/from stand   Upper Body Dressing : Moderate assistance;Sitting   Lower Body Dressing: Maximal assistance;Sit to/from stand Lower Body Dressing Details (indicate cue type and reason): Required increased assistance due to pain Toilet Transfer: Min guard;Ambulation;BSC;Cueing for safety Toilet Transfer Details (indicate cue type and reason): +2 for lines would be helpful Toileting- Clothing Manipulation and Hygiene: Moderate assistance;Sit to/from stand Toileting - Clothing Manipulation Details (indicate cue type and reason): assist for pericare     Functional mobility during ADLs: Min guard;Cueing for safety General ADL Comments: Began education on precautions, sling wear protocol, positioning for sleep, and compensatory strategies for UB bathing and dressing. Pt's granddaughter present for OT evaluation.     Vision Vision Assessment?: No apparent visual deficits   Perception     Praxis      Pertinent Vitals/Pain Pain Assessment: Faces  Faces Pain Scale: Hurts even more Pain Location: L shoulder with movement Pain Descriptors / Indicators: Sore;Aching Pain Intervention(s): Limited activity within patient's tolerance;Monitored during  session;Repositioned     Hand Dominance Right   Extremity/Trunk Assessment Upper Extremity Assessment Upper Extremity Assessment: LUE deficits/detail LUE Deficits / Details: decreased ROM and strength as expected with injury LUE: Unable to fully assess due to immobilization;Unable to fully assess due to pain   Lower Extremity Assessment Lower Extremity Assessment: Overall WFL for tasks assessed (increased time and effort for rise to stand)   Cervical / Trunk Assessment Cervical / Trunk Assessment: Normal   Communication Communication Communication: No difficulties   Cognition Arousal/Alertness: Awake/alert Behavior During Therapy: WFL for tasks assessed/performed Overall Cognitive Status: Within Functional Limits for tasks assessed                     General Comments       Exercises Exercises: General Upper Extremity     Shoulder Instructions      Home Living Family/patient expects to be discharged to:: Private residence Living Arrangements: Spouse/significant other Available Help at Discharge: Family;Available 24 hours/day Type of Home: House Home Access: Stairs to enter Entergy CorporationEntrance Stairs-Number of Steps: 5 Entrance Stairs-Rails: Right Home Layout: One level     Bathroom Shower/Tub: Tub/shower unit;Curtain Shower/tub characteristics: Engineer, building servicesCurtain Bathroom Toilet: Standard     Home Equipment: None          Prior Functioning/Environment Level of Independence: Independent             OT Diagnosis: Acute pain   OT Problem List: Decreased strength;Decreased range of motion;Decreased activity tolerance;Impaired balance (sitting and/or standing);Decreased safety awareness;Decreased knowledge of use of DME or AE;Decreased knowledge of precautions;Pain;Impaired UE functional use   OT Treatment/Interventions: Self-care/ADL training;Therapeutic exercise;Energy conservation;DME and/or AE instruction;Therapeutic activities;Patient/family education;Balance  training    OT Goals(Current goals can be found in the care plan section) Acute Rehab OT Goals Patient Stated Goal: Hopes to heal and have less pain soon OT Goal Formulation: With patient Time For Goal Achievement: 04/15/16 Potential to Achieve Goals: Good ADL Goals Pt Will Perform Upper Body Dressing: with min assist;with caregiver independent in assisting;sitting Pt Will Perform Lower Body Dressing: with min assist;with caregiver independent in assisting;sit to/from stand Pt Will Transfer to Toilet: with supervision;ambulating;bedside commode (over toilet) Pt Will Perform Toileting - Clothing Manipulation and hygiene: with supervision;sitting/lateral leans;sit to/from stand Pt Will Perform Tub/Shower Transfer: Tub transfer;with min guard assist;ambulating;shower seat Pt/caregiver will Perform Home Exercise Program: Increased ROM;Increased strength;Left upper extremity;With Supervision;With written HEP provided  OT Frequency: Min 3X/week   Barriers to D/C:            Co-evaluation              End of Session Equipment Utilized During Treatment: Gait belt;Other (comment);Oxygen (sling) Nurse Communication: Mobility status;Other (comment) (pillow placement behind L shoulder)  Activity Tolerance: Patient limited by pain Patient left: in chair;with call bell/phone within reach;with family/visitor present   Time: 1610-96041104-1154 OT Time Calculation (min): 50 min Charges:  OT General Charges $OT Visit: 1 Procedure OT Evaluation $OT Eval Moderate Complexity: 1 Procedure OT Treatments $Self Care/Home Management : 23-37 mins G-Codes:    Nils PyleJulia Sutton Hirsch, OTR/L Pager: [phone] 04/01/2016, 12:17 PM

## 2016-04-01 NOTE — Progress Notes (Signed)
Physical Therapy Treatment Patient Details Name: Brandon [name] MRN: [id number] DOB: 1950-12-31 Today's Date: 04/01/2016    History of Present Illness HPI: patient is a 65 yo WM struck by large tree limb on 03/30/16 while standing on ladder approx 4 feet above the ground. pt with clavicle fracture, multiple left rib fractures, hemopneumothorax on left, small apical PTX on right. PMHx of chest pain, HTN, and gallbladder disorder.    PT Comments    Pt with good mobility today. Pt able to transfer and ambulate with assistance for line management, but decreased activity tolerance. Pt reported shoulder pain throughout session and difficulty finding a comfortable position for LUE. Educated pt on stairs and encouraged daily mobility with nursing staff and at home. Will continue to follow.   O2 sats ranged from 88-94 throughout session on 1-2L of O2.  Follow Up Recommendations  Outpatient PT;Supervision - Intermittent     Equipment Recommendations       Recommendations for Other Services       Precautions / Restrictions Precautions Precautions: Fall Precaution Comments: Chest tube Required Braces or Orthoses: Sling Restrictions Weight Bearing Restrictions: Yes LUE Weight Bearing:  (will proceed NWB unless otherwise ordered)    Mobility  Bed Mobility               General bed mobility comments: pt in chair on arrival  Transfers Overall transfer level: Needs assistance Equipment used: None Transfers: Sit to/from Stand Sit to Stand: Supervision         General transfer comment: pt able to rise without assistance or cues. supervision for lines.  Ambulation/Gait Ambulation/Gait assistance: Min guard Ambulation Distance (Feet): 400 Feet Assistive device: None Gait Pattern/deviations: Step-through pattern   Gait velocity interpretation: Below normal speed for age/gender General Gait Details: guard for line management and safety due to reports of dizziness, but no LOB  observed.   Stairs Stairs: Yes Stairs assistance: Min guard Stair Management: One rail Right;Step to pattern Number of Stairs: 5 General stair comments: increased time needed to complete stairs, but able to complete without cues or assistance  Wheelchair Mobility    Modified Rankin (Stroke Patients Only)       Balance                    Cognition Arousal/Alertness: Awake/alert Behavior During Therapy: WFL for tasks assessed/performed Overall Cognitive Status: Within Functional Limits for tasks assessed                      Exercises    General Comments      Pertinent Vitals/Pain Pain Assessment: Faces Faces Pain Scale: Hurts even more Pain Location: left shoulder with movement Pain Descriptors / Indicators: Aching Pain Intervention(s): Limited activity within patient's tolerance;Monitored during session    Home Living      Prior Function Level of Independence: Independent          PT Goals (current goals can now be found in the care plan section) Acute Rehab PT Goals Patient Stated Goal: Hopes to heal and have less pain soon Progress towards PT goals: Progressing toward goals    Frequency       PT Plan Current plan remains appropriate    Co-evaluation             End of Session Equipment Utilized During Treatment: Gait belt Activity Tolerance: Patient tolerated treatment well Patient left: Other (comment) (on the toilet. instructed pt to call nursing staff to return  to chair)     Time: [id number] PT Time Calculation (min) (ACUTE ONLY): 25 min  Charges:  $Gait Training: 23-37 mins                    G CodesCherene Julian:      Famous Eisenhardt 04/01/2016, 1:08 PM   Cherene JulianPaola Kahleel Fadeley, MarylandPT [phone]

## 2016-04-02 ENCOUNTER — Inpatient Hospital Stay (HOSPITAL_COMMUNITY): Payer: 59

## 2016-04-02 DIAGNOSIS — S298XXA Other specified injuries of thorax, initial encounter: Secondary | ICD-10-CM | POA: Diagnosis present

## 2016-04-02 DIAGNOSIS — D62 Acute posthemorrhagic anemia: Secondary | ICD-10-CM | POA: Diagnosis not present

## 2016-04-02 DIAGNOSIS — R338 Other retention of urine: Secondary | ICD-10-CM | POA: Diagnosis not present

## 2016-04-02 LAB — TSH: TSH: 0.546 u[IU]/mL (ref 0.350–4.500)

## 2016-04-02 LAB — T4, FREE: Free T4: 0.65 ng/dL (ref 0.61–1.12)

## 2016-04-02 MED ORDER — PROCHLORPERAZINE EDISYLATE 5 MG/ML IJ SOLN
10.0000 mg | INTRAMUSCULAR | Status: DC | PRN
  Administered 2016-04-02: 10 mg via INTRAVENOUS
  Filled 2016-04-02 (×2): qty 2

## 2016-04-02 NOTE — Progress Notes (Signed)
Physical Therapy Treatment/ Discharge Patient Details Name: Brandon [name] MRN: [id number] DOB: Apr 14, 1951 Today's Date: 04/02/2016    History of Present Illness HPI: patient is a 65 yo WM struck by large tree limb on 03/30/16 while standing on ladder approx 4 feet above the ground. pt with clavicle fracture, multiple left rib fractures, hemopneumothorax on left, small apical PTX on right. PMHx of chest pain, HTN, and gallbladder disorder.    PT Comments    Pt pleasant but nauseated throughout session without emesis. Able to complete all basic transfers and gait without assist. Educated for positioning with bed mobility, assist with dressing and ADLs per wife's questions. All questions answered with pt encouraged to mobilize with family and nursing. Pt with sats 89-94% on RA throughout session. No further needs with pt and wife aware and agreeable and will sign off.    Follow Up Recommendations  No PT follow up     Equipment Recommendations  None recommended by PT    Recommendations for Other Services       Precautions / Restrictions Precautions Precautions: Fall Precaution Comments: Chest tube Required Braces or Orthoses: Sling Restrictions LUE Weight Bearing: Non weight bearing    Mobility  Bed Mobility Overal bed mobility: Needs Assistance Bed Mobility: Rolling;Sidelying to Sit;Sit to Sidelying Rolling: Modified independent (Device/Increase time) Sidelying to sit: Supervision     Sit to sidelying: Supervision General bed mobility comments: cues for LLE position to roll and for sequence. assistance only for chest tube  Transfers Overall transfer level: Modified independent Equipment used: None             General transfer comment: x3 trials from chair and from toilet. pt able to rise with ease without assistance or cues. progressed from supervision to mod I by end of session.  Ambulation/Gait Ambulation/Gait assistance: Supervision Ambulation Distance (Feet):  500 Feet Assistive device: None Gait Pattern/deviations: Decreased stride length;Step-through pattern Gait velocity: pt with decreased velocity compared to previous session due to nausea Gait velocity interpretation: Below normal speed for age/gender General Gait Details: assist for chest tube   Stairs            Wheelchair Mobility    Modified Rankin (Stroke Patients Only)       Balance                                    Cognition Arousal/Alertness: Awake/alert Behavior During Therapy: WFL for tasks assessed/performed Overall Cognitive Status: Within Functional Limits for tasks assessed                      Exercises      General Comments        Pertinent Vitals/Pain Faces Pain Scale: Hurts little more Pain Location: left clavicle    Home Living                      Prior Function            PT Goals (current goals can now be found in the care plan section) Progress towards PT goals: Goals met/education completed, patient discharged from PT    Frequency       PT Plan Discharge plan needs to be updated    Co-evaluation             End of Session Equipment Utilized During Treatment: Gait belt Activity Tolerance: Patient tolerated  treatment well Patient left: in bed;with family/visitor present;with call bell/phone within reach     Time: 1224-1300 PT Time Calculation (min) (ACUTE ONLY): 36 min  Charges:  $Gait Training: 8-22 mins $Therapeutic Activity: 8-22 mins                    G Codes:      Melford Aase 2016-04-17, 6:00 PM [place], [place]

## 2016-04-02 NOTE — Care Management Note (Signed)
Case Management Note  Patient Details  Name: Brandon [name] MRN: [id number] Date of Birth: 03/10/1951  Subjective/Objective:   Pt admitted on 03/30/16 s/p fall from 4 feet with multiple bilateral rib fractures, Lt clavicle fracture, and pneumothorax.  PTA, pt independent, lives with spouse.            Action/Plan: Pt states wife, son and sister will provide care at dc.  Will follow for dc needs as pt progresses.    Expected Discharge Date:                  Expected Discharge Plan:  Home/Self Care  In-House Referral:     Discharge planning Services  CM Consult  Post Acute Care Choice:    Choice offered to:     DME Arranged:    DME Agency:     HH Arranged:    HH Agency:     Status of Service:  In process, will continue to follow  Medicare Important Message Given:    Date Medicare IM Given:    Medicare IM give by:    Date Additional Medicare IM Given:    Additional Medicare Important Message give by:     If discussed at Long Length of Stay Meetings, dates discussed:    Additional Comments:  Quintella BatonJulie W. Nivek Powley, RN, BSN  Trauma/Neuro ICU Case Manager [phone]

## 2016-04-02 NOTE — Progress Notes (Signed)
OT Cancellation Note  Patient Details Name: Brandon [name] MRN: [id number] DOB: [birth year]   Cancelled Treatment:    Reason Eval/Treat Not Completed: Other (comment) Pt currently working with PT. Will re-attempt later today if time allows.  Nils PyleJulia Parthiv Mucci, OTR/L Pager: [phone] 04/02/2016, 12:50 PM

## 2016-04-02 NOTE — Progress Notes (Signed)
Patient ID: Brandon [name], male   DOB: July 18, 1951, 65 y.o.   MRN: [id number]   LOS: 3 days   Subjective: Had bad day/night with nausea but no emesis and a lot of gas, passing flatus.   Objective: Vital signs in last 24 hours: Temp:  [97.6 F (36.4 C)-98.8 F (37.1 C)] 98.3 F (36.8 C) (05/09 44030633) Pulse Rate:  [88-116] 97 (05/09 0633) Resp:  [16-18] 18 (05/09 0633) BP: (127-149)/(75-80) 149/80 mmHg (05/09 0633) SpO2:  [91 %-98 %] 95 % (05/09 0633) Last BM Date: 03/30/16   IS: 1000ml   CT No air leak 23670ml/24h @1000ml    Radiology Results PORTABLE CHEST 1 VIEW  COMPARISON: Portable chest x-ray of Apr 01, 2016.  FINDINGS: The lungs are adequately inflated. No pneumothorax is evident. There is pleural thickening in the left pulmonary apex adjacent to upper rib fractures. A left-sided chest tube is in stable position overlying the medial aspect of the ninth rib posteriorly. A small left pleural effusion is present. A fracture of the midclavicular shaft on the left is again demonstrated. The right lung is adequately inflated. The interstitial markings are coarse. An area of nodular density projects over the posterior aspect of the 6th rib and is stable and may be related to the medial border of the scapula. The mediastinum is normal in width. The cardiac silhouette is normal in size.  IMPRESSION: Slight interval improvement pulmonary interstitial edema. Persistent post traumatic changes in the upper left hemi thorax. No pneumothorax or large pleural effusion is evident. Stable left chest tube.   Electronically Signed  By: David SwazilandJordan M.D.  On: 04/02/2016 07:29   Physical Exam General appearance: alert and no distress Resp: clear to auscultation bilaterally Cardio: regular rate and rhythm GI: normal findings: bowel sounds normal and soft, non-tender   Assessment/Plan: Fall from 4 ft/crush injury Multiple left and right rib  fractures//PTX/pulmonary contusion- OP too high for removal, continue water seal. Pt has audible "pop" on the right side, monitor. May need TCTS consult Left clavicle fracture-non op per Dr. Magnus IvanBlackman ABL anemia -- Mild Urinary retention- voiding trial today, continue Flomax but d/c urecholine as likely responsible for nausea Hypothyroidism-home meds FEN - No issues VTE - SCD's, Lovenox  Dispo -- PT/OT    Brandon CaldronMichael J. Jalyne Brodzinski, PA-C Pager: [phone] General Trauma PA Pager: [phone]  04/02/2016

## 2016-04-03 ENCOUNTER — Inpatient Hospital Stay (HOSPITAL_COMMUNITY): Payer: 59

## 2016-04-03 MED ORDER — OXYCODONE HCL 5 MG PO TABS
5.0000 mg | ORAL_TABLET | ORAL | Status: DC | PRN
  Filled 2016-04-03: qty 3

## 2016-04-03 MED ORDER — TRAMADOL HCL 50 MG PO TABS
100.0000 mg | ORAL_TABLET | Freq: Four times a day (QID) | ORAL | Status: DC
  Administered 2016-04-03 – 2016-04-05 (×9): 100 mg via ORAL
  Filled 2016-04-03 (×9): qty 2

## 2016-04-03 NOTE — Progress Notes (Signed)
Occupational Therapy Treatment Patient Details Name: Brandon [name] MRN: [id number] DOB: [birth year] Today's Date: 04/03/2016    History of present illness HPI: patient is a 65 y.o. struck by large tree limb on 03/30/16 while standing on ladder approx 4 feet above the ground. pt with clavicle fracture, multiple left rib fractures, hemopneumothorax on left, small apical PTX on right. PMHx of chest pain, HTN, and gallbladder disorder.   OT comments  Pt progressing. Education provided in session. Wife came in during session. Plan to see pt tomorrow for another session.   Follow Up Recommendations  Outpatient OT;Supervision/Assistance - 24 hour    Equipment Recommendations  3 in 1 bedside comode    Recommendations for Other Services      Precautions / Restrictions Precautions Precautions: Fall Precaution Comments: Chest tube; no left shoulder abduction past 90 degrees, otherwise can move Lt UE with pain being his guide Required Braces or Orthoses: Sling Restrictions Weight Bearing Restrictions: No (LUE-WBAT)       Mobility Bed Mobility Overal bed mobility: Needs Assistance Bed Mobility: Supine to Sit;Sit to Supine     Supine to sit: Supervision Sit to supine: Supervision      Transfers Overall transfer level: Needs assistance   Transfers: Sit to/from Stand Sit to Stand: Supervision              Balance      No LOB in session.                             ADL Overall ADL's : Needs assistance/impaired   Eating/Feeding Details (indicate cue type and reason): took meds sitting EOB with no assist needed     Upper Body Bathing: Minimal assitance;Sitting Upper Body Bathing Details (indicate cue type and reason): washed/dryed armpits     Upper Body Dressing : Sitting;Moderate assistance Upper Body Dressing Details (indicate cue type and reason): sling     Toilet Transfer: Supervision/safety;Comfort height toilet;Grab bars;Ambulation   Toileting-  Clothing Manipulation and Hygiene: Supervision/safety (standing; did not have to perform hygiene)       Functional mobility during ADLs: Supervision/safety General ADL Comments: Educated on UB dressing technique as well as UB bathing technique and discussed UB clothing that may be easier to manage. Discussed options for shower chair. Recommended sitting for LB bathing.  Educated on positioning of pillow behind Lt UE. Educated on sling. Educated on Lt UE exercises.  Discussed bed mobility.       Vision                     Perception     Praxis      Cognition  Awake/Alert Behavior During Therapy: WFL for tasks assessed/performed Overall Cognitive Status: Within Functional Limits for tasks assessed                       Extremity/Trunk Assessment               Exercises Other Exercises Other Exercises: Pt performed AAROM left shoulder flexion while supine in bed. Performed AROM left elbow flexion/extension in sitting, moved left digits and OT educated on wrist flexion/extension   Shoulder Instructions       General Comments      Pertinent Vitals/ Pain       Pain Assessment: 0-10 Pain Score: 2  Pain Location: Lt shoulder/clavicle area Pain Descriptors / Indicators: Aching;Grimacing Pain Intervention(s): Monitored during session;Repositioned  Home Living                                          Prior Functioning/Environment              Frequency Min 3X/week     Progress Toward Goals  OT Goals(current goals can now be found in the care plan section)  Progress towards OT goals: Progressing toward goals  Acute Rehab OT Goals Patient Stated Goal: not stated OT Goal Formulation: With patient Time For Goal Achievement: 04/15/16 Potential to Achieve Goals: Good ADL Goals Pt Will Perform Upper Body Dressing: with min assist;with caregiver independent in assisting;sitting Pt Will Perform Lower Body Dressing: with min  assist;with caregiver independent in assisting;sit to/from stand Pt Will Transfer to Toilet: with supervision;ambulating;bedside commode (over toilet) Pt Will Perform Toileting - Clothing Manipulation and hygiene: with supervision;sitting/lateral leans;sit to/from stand Pt Will Perform Tub/Shower Transfer: Tub transfer;with min guard assist;ambulating;shower seat Pt/caregiver will Perform Home Exercise Program: Increased ROM;Increased strength;Left upper extremity;With Supervision;With written HEP provided  Plan Discharge plan remains appropriate    Co-evaluation                 End of Session Equipment Utilized During Treatment: Other (comment) (sling)   Activity Tolerance Patient limited by pain   Patient Left in bed;with call bell/phone within reach;with family/visitor present   Nurse Communication          Time: 1308-6578 OT Time Calculation (min): 37 min  Charges: OT General Charges $OT Visit: 1 Procedure OT Treatments $Self Care/Home Management : 8-22 mins $Therapeutic Exercise: 8-22 mins  Earlie Raveling OTR/L [phone] 04/03/2016, 1:27 PM

## 2016-04-03 NOTE — Progress Notes (Addendum)
Patient ID: Brandon [name], male   DOB: [birth year], 65 y.o.   MRN: [id number]   LOS: 4 days   Subjective: Feels better today but still having intermittent nausea.   Objective: Vital signs in last 24 hours: Temp:  [97.8 F (36.6 C)-98.3 F (36.8 C)] 98.2 F (36.8 C) (05/10 0423) Pulse Rate:  [87-95] 95 (05/10 0423) Resp:  [20] 20 (05/10 0423) BP: (146-156)/(69-79) 156/79 mmHg (05/10 0423) SpO2:  [89 %-98 %] 98 % (05/10 0423) Last BM Date: 04/02/16   IS: 1250ml (+2950ml)   CT No air leak 34480ml/24h @1380ml    Radiology Results PORTABLE CHEST 1 VIEW  COMPARISON: Portable chest x-ray of Apr 02, 2016  FINDINGS: There is mild volume loss on the left. Deformity of the posterior upper first through fourth ribs is observed. No pneumothorax is evident today. The left-sided chest tube is in stable position the right lung is well-expanded and clear. Stable nodular density projects in the right upper lobe. The heart is top-normal in size. The pulmonary vascularity is normal. The mid shaft left clavicular fracture is again demonstrated.  IMPRESSION: Fairly stable appearance of the thorax today. Post traumatic changes in the left pulmonary apex appear stable. No pneumothorax or significant pleural effusion are demonstrated. The left-sided chest tube is in stable position.   Electronically Signed  By: David SwazilandJordan M.D.  On: 04/03/2016 07:31   Physical Exam General appearance: alert and no distress Resp: clear to auscultation bilaterally Cardio: regular rate and rhythm GI: normal findings: bowel sounds normal and soft, non-tender   Assessment/Plan: Fall from 4 ft/crush injury Multiple left and right rib fractures//PTX/pulmonary contusion- OP too high for removal, continue water seal. Pt has audible "pop" on the right side, monitor. May need TCTS consult Left clavicle fracture-non op per Dr. Magnus IvanBlackman ABL anemia -- Mild Urinary retention-  Resolved Hypothyroidism-home meds FEN - Nausea improved but not resolved. Will try oral pain meds again today to see if that's the culprit. Will start scheduled tramadol to lessen narcotic requirement. VTE - SCD's, Lovenox  Dispo -- PT/OT    Freeman CaldronMichael J. Jeffery, PA-C Pager: [phone] General Trauma PA Pager: [phone]  04/03/2016   Still draining much too much fluid to consider removing the chest tube.  This patient has been seen and I agree with the findings and treatment plan.  Marta LamasJames O. Gae BonWyatt, III, MD, FACS [phone] (pager) [phone] (direct pager) Trauma Surgeon

## 2016-04-04 ENCOUNTER — Inpatient Hospital Stay (HOSPITAL_COMMUNITY): Payer: 59

## 2016-04-04 MED ORDER — MAGNESIUM CITRATE PO SOLN
1.0000 | Freq: Once | ORAL | Status: AC
  Administered 2016-04-04: 1 via ORAL
  Filled 2016-04-04: qty 296

## 2016-04-04 NOTE — Progress Notes (Signed)
Pt's FMLA paperwork completed, signed by MD, and faxed to pt's employer per his request.  Originals given back to patient.    Quintella BatonJulie W. Cyrstal Leitz, RN, BSN  Trauma/Neuro ICU Case Manager [phone]

## 2016-04-04 NOTE — Clinical Social Work Note (Signed)
Clinical Social Work Assessment  Patient Details  Name: Brandon [name] MRN: [id number] Date of Birth: November 10, 1951  Date of referral:  04/04/16               Reason for consult:  Trauma                Permission sought to share information with:  Family Supports Permission granted to share information::  Yes, Verbal Permission Granted  Name::     Falon Flinchum  Relationship::  Spouse  Contact Information:  [phone]  Housing/Transportation Living arrangements for the past 2 months:  [place] of Information:  Patient Patient Interpreter Needed:  None Criminal Activity/Legal Involvement Pertinent to Current Situation/Hospitalization:  No - Comment as needed Significant Relationships:  Spouse Lives with:  Spouse Do you feel safe going back to the place where you live?  Yes Need for family participation in patient care:  Yes (Comment)  Care giving concerns:  No family/friends at bedside.  Patient states that his wife will be able to assist at home and has no concerns at this time.   Social Worker assessment / plan:  Holiday representative met with patient at bedside to offer support and discuss patient needs at discharge.  Patient states that he was up on a ladder when a tree limb hit him.  Patient lives at home with his wife and plans to return at discharge.  CSW inquired about current substance use.  Patient does not admit to any use at this time.  SBIRT complete and no resources needed.  Clinical Social Worker will sign off for now as social work intervention is no longer needed. Please consult [place] again if new need arises.  Employment status:  Therapist, music:  Managed Care PT Recommendations:  No Follow Up Information / Referral to community resources:  SBIRT  Patient/Family's Response to care:  Patient verbalized understanding and appreciation for CSW support and concern.  Patient/Family's Understanding of and Emotional Response to Diagnosis,  Current Treatment, and Prognosis:  Patient with a good understanding of limitations and the need to slowly return to activity.  Emotional Assessment Appearance:  Appears stated age Attitude/Demeanor/Rapport:    Affect (typically observed):  Accepting, Appropriate, Calm Orientation:  Oriented to Self, Oriented to Place, Oriented to  Time, Oriented to Situation Alcohol / Substance use:  Never Used Psych involvement (Current and /or in the community):  No (Comment)  Discharge Needs  Concerns to be addressed:  No discharge needs identified Readmission within the last 30 days:  No Current discharge risk:  None Barriers to Discharge:  No Barriers Identified, Continued Medical Work up  [employer], [place]

## 2016-04-04 NOTE — Progress Notes (Signed)
Chest tube canister replaced, to water seal.

## 2016-04-04 NOTE — Progress Notes (Signed)
Pt. Refused bath

## 2016-04-04 NOTE — Progress Notes (Signed)
Patient ID: Brandon [name], male   DOB: [birth year], 65 y.o.   MRN: [id number]   LOS: 5 days   Subjective: Nausea improved, still no BM   Objective: Vital signs in last 24 hours: Temp:  [98 F (36.7 C)-98.5 F (36.9 C)] 98.5 F (36.9 C) (05/11 0527) Pulse Rate:  [83-89] 83 (05/11 0527) Resp:  [18-20] 18 (05/11 0527) BP: (140-150)/(74-85) 140/85 mmHg (05/11 0527) SpO2:  [95 %-97 %] 95 % (05/11 0527) Last BM Date: 04/02/16    IS: 1500ml (+22250ml)   CT No air leak 34810ml/24h @1690ml    Radiology Results PORTABLE CHEST 1 VIEW  COMPARISON: 04/03/2016  FINDINGS: Cardiac shadow is stable. Multiple left rib fractures are again seen. A chest tube is noted in place. No definitive pneumothorax is seen. Some apical capping is noted likely related subpleural hemorrhage. Mild subcutaneous emphysema is again noted. The right lung remains well aerated. Stable densities noted in the right lung apex related in part to the anterior aspect of the second rib. The known underlying nodule is less well appreciated on the current study. No new focal abnormality is seen.  IMPRESSION: Relatively stable appearance of the chest when compared with the prior exam.   Electronically Signed  By: Alcide CleverMark Lukens M.D.  On: 04/04/2016 07:40   Physical Exam General appearance: alert and no distress Resp: clear to auscultation bilaterally Cardio: regular rate and rhythm GI: normal findings: bowel sounds normal and soft, non-tender   Assessment/Plan: Fall from 4 ft/crush injury Multiple left and right rib fractures//PTX/pulmonary contusion- OP too high for removal, continue water seal. Pt has audible "pop" on the right side, monitor. May need TCTS consult Left clavicle fracture-non op per Dr. Magnus IvanBlackman ABL anemia -- Mild Urinary retention- Resolved Hypothyroidism-home meds FEN - Doing well solely on scheduled tramadol. Try mag citrate. VTE - SCD's, Lovenox  Dispo --  PT/OT    Freeman CaldronMichael J. Quana Chamberlain, PA-C Pager: [phone] General Trauma PA Pager: [phone]  04/04/2016

## 2016-04-04 NOTE — Progress Notes (Signed)
Occupational Therapy Treatment/Discharge Patient Details Name: Brandon [name] MRN: [id number] DOB: 1951-01-02 Today's Date: 04/04/2016    History of present illness HPI: patient is a 65 y.o. struck by large tree limb on 03/30/16 while standing on ladder approx 4 feet above the ground. pt with clavicle fracture, multiple left rib fractures, hemopneumothorax on left, small apical PTX on right. PMHx of chest pain, HTN, and gallbladder disorder.   OT comments  Pt progressing very well and has achieved all OT goals. Reviewed all UB ADL compensatory strategies, practiced RUE HEP, and educated pt on fall prevention and home safety strategies. Pt's SaO2 remained above 93% on RA throughout entire session. All education has been completed and pt has no further questions. Provided pt with handout with all necessary information on it for ADLs and HEP. Pt with no further acute OT needs. OT signing off.   Follow Up Recommendations  Outpatient OT;Supervision/Assistance - 24 hour    Equipment Recommendations  3 in 1 bedside comode    Recommendations for Other Services      Precautions / Restrictions Precautions Precautions: Fall Precaution Comments: Chest tube; no left shoulder abduction past 90 degrees, otherwise can move Lt UE with pain being his guide Required Braces or Orthoses: Sling Restrictions Weight Bearing Restrictions: No       Mobility Bed Mobility Overal bed mobility: Needs Assistance Bed Mobility: Supine to Sit Rolling: Supervision         General bed mobility comments: Supervision for safety due to lightheadedness and nausea  Transfers Overall transfer level: Needs assistance Equipment used: None Transfers: Sit to/from Stand Sit to Stand: Min guard         General transfer comment: Min guard for safety due to lightheadedness and occasional staggering steps. Verbal cues for safe hand placement.    Balance Overall balance assessment: Needs assistance Sitting-balance  support: No upper extremity supported;Feet supported Sitting balance-Leahy Scale: Good     Standing balance support: No upper extremity supported;During functional activity Standing balance-Leahy Scale: Good                     ADL Overall ADL's : Needs assistance/impaired         Upper Body Bathing: Minimal assitance;Sitting   Lower Body Bathing: Minimal assistance;Sit to/from stand   Upper Body Dressing : Min guard;Sitting;Cueing for compensatory techniques Upper Body Dressing Details (indicate cue type and reason): Able to don/doff sling independently Lower Body Dressing: Minimal assistance;Sit to/from stand   Toilet Transfer: Min guard;Ambulation;Comfort height toilet   Toileting- Clothing Manipulation and Hygiene: Min guard;Sit to/from stand       Functional mobility during ADLs: Min guard General ADL Comments: Reviewed all education on UB ADL tasks. Practiced HEP for LUE and provided handout      Vision                     Perception     Praxis      Cognition   Behavior During Therapy: [facility] for tasks assessed/performed Overall Cognitive Status: Within Functional Limits for tasks assessed                       Extremity/Trunk Assessment               Exercises General Exercises - Upper Extremity Shoulder Flexion: AAROM;Left;10 reps;Seated Elbow Flexion: AROM;Left;10 reps;Seated Elbow Extension: AROM;Left;10 reps;Seated Wrist Flexion: AROM;Left;10 reps;Seated Wrist Extension: AROM;Left;10 reps;Seated Digit Composite Flexion: AROM;Left;10 reps;Seated Composite  Extension: AROM;Left;10 reps;Seated Donning/doffing shirt without moving shoulder: Min-guard Method for sponge bathing under operated UE: Min-guard Donning/doffing sling/immobilizer: Supervision/safety Correct positioning of sling/immobilizer: Supervision/safety ROM for elbow, wrist and digits of operated UE: Supervision/safety Sling wearing schedule (on at all  times/off for ADL's): Supervision/safety Proper positioning of operated UE when showering: Supervision/safety Positioning of UE while sleeping: Supervision/safety   Shoulder Instructions Shoulder Instructions Donning/doffing shirt without moving shoulder: Min-guard Method for sponge bathing under operated UE: Min-guard Donning/doffing sling/immobilizer: Supervision/safety Correct positioning of sling/immobilizer: Supervision/safety ROM for elbow, wrist and digits of operated UE: Supervision/safety Sling wearing schedule (on at all times/off for ADL's): Supervision/safety Proper positioning of operated UE when showering: Supervision/safety Positioning of UE while sleeping: Supervision/safety     General Comments      Pertinent Vitals/ Pain       Pain Assessment: 0-10 Pain Score: 6  Pain Location: L shoulder Pain Descriptors / Indicators: Aching;Grimacing;Guarding Pain Intervention(s): Limited activity within patient's tolerance;Monitored during session;Premedicated before session;Repositioned;Ice applied  Home Living                                          Prior Functioning/Environment              Frequency       Progress Toward Goals  OT Goals(current goals can now be found in the care plan section)  Progress towards OT goals: Goals met/education completed, patient discharged from OT  Acute Rehab OT Goals Patient Stated Goal: to get chest tube out OT Goal Formulation: With patient Time For Goal Achievement: 04/15/16 Potential to Achieve Goals: Good ADL Goals Pt Will Perform Upper Body Dressing: with min assist;with caregiver independent in assisting;sitting Pt Will Perform Lower Body Dressing: with min assist;with caregiver independent in assisting;sit to/from stand Pt Will Transfer to Toilet: with supervision;ambulating;bedside commode Pt Will Perform Toileting - Clothing Manipulation and hygiene: with supervision;sitting/lateral leans;sit  to/from stand Pt Will Perform Tub/Shower Transfer: Tub transfer;with min guard assist;ambulating;shower seat Pt/caregiver will Perform Home Exercise Program: Increased ROM;Increased strength;Left upper extremity;With Supervision;With written HEP provided  Plan All goals met and education completed, patient discharged from OT services    Co-evaluation                 End of Session Equipment Utilized During Treatment: Other (comment) (sling)   Activity Tolerance Patient tolerated treatment well   Patient Left in chair;with call bell/phone within reach;with family/visitor present   Nurse Communication Mobility status        Time: 0623-7628 OT Time Calculation (min): 31 min  Charges: OT General Charges $OT Visit: 1 Procedure OT Treatments $Self Care/Home Management : 8-22 mins $Therapeutic Exercise: 8-22 mins  Redmond Baseman, OTR/L Pager: [phone] 04/04/2016, 4:51 PM

## 2016-04-05 ENCOUNTER — Inpatient Hospital Stay (HOSPITAL_COMMUNITY): Payer: 59

## 2016-04-05 MED ORDER — TRAMADOL HCL 50 MG PO TABS
100.0000 mg | ORAL_TABLET | Freq: Four times a day (QID) | ORAL | Status: DC | PRN
Start: 2016-04-05 — End: 2016-04-07
  Administered 2016-04-05 – 2016-04-07 (×5): 100 mg via ORAL
  Filled 2016-04-05 (×6): qty 2

## 2016-04-05 MED ORDER — FLEET ENEMA 7-19 GM/118ML RE ENEM: 1.0000 | ENEMA | Freq: Two times a day (BID) | RECTAL | Status: DC | PRN

## 2016-04-05 MED ORDER — POLYETHYLENE GLYCOL 3350 17 G PO PACK
17.0000 g | PACK | Freq: Two times a day (BID) | ORAL | Status: DC
  Administered 2016-04-05 – 2016-04-07 (×5): 17 g via ORAL
  Filled 2016-04-05 (×5): qty 1

## 2016-04-05 NOTE — Progress Notes (Signed)
Patient ID: Brandon [name], male   DOB: 09/16/1951, 65 y.o.   MRN: [id number]   LOS: 6 days   Subjective: Had a loose BM yesterday, still struggling with anorexia and nausea   Objective: Vital signs in last 24 hours: Temp:  [98.1 F (36.7 C)] 98.1 F (36.7 C) (05/12 0520) Pulse Rate:  [88-92] 92 (05/12 0520) Resp:  [18] 18 (05/12 0520) BP: (148-155)/(73-84) 155/84 mmHg (05/12 0520) SpO2:  [93 %-95 %] 93 % (05/12 0520) Last BM Date: 04/01/16   IS: 1750ml (+23150ml)   CT No air leak 32380ml/24h @190ml    Radiology Results PORTABLE CHEST 1 VIEW  COMPARISON: Chest x-ray of Apr 04, 2016  FINDINGS: The lungs remain adequately inflated. Significant abnormalities in the left pulmonary apex persist. A small apical pneumothorax is visible today. The left-sided chest tube is in stable position. There is no significant pleural effusion. The right lung is well-expanded and clear. There is stable density in the right upper lobe the heart is top-normal in size but stable. The pulmonary vascularity is not engorged. Multiple upper left rib fractures are again demonstrated as is the fracture of the distal third of the shaft of the left clavicle.  IMPRESSION: Fairly stable appearance of the chest overall. A 5-10 % left apical pneumothorax is visible today likely due to slight changes in positioning. The left-sided chest tube is stable.   Electronically Signed  By: Brandon SwazilandJordan M.D.  On: 04/05/2016 07:27   Physical Exam General appearance: alert and no distress Resp: clear to auscultation bilaterally Cardio: regular rate and rhythm GI: normal findings: bowel sounds normal and soft, non-tender   Assessment/Plan: Fall from 4 ft/crush injury Multiple left and right rib fractures//PTX/pulmonary contusion- OP too high for removal, continue water seal. Pt has audible "pop" on the right side, monitor. May need TCTS consult Left clavicle fracture-non op per Dr. Magnus IvanBlackman ABL  anemia -- Mild Hypothyroidism-home meds FEN - Doing well solely on scheduled tramadol. Try Fleet's. VTE - SCD's, Lovenox  Dispo -- PT/OT    Brandon CaldronMichael J. Donneisha Beane, PA-C Pager: [phone] General Trauma PA Pager: [phone]  04/05/2016

## 2016-04-06 ENCOUNTER — Inpatient Hospital Stay (HOSPITAL_COMMUNITY): Payer: 59

## 2016-04-06 LAB — BASIC METABOLIC PANEL
Anion gap: 11 (ref 5–15)
BUN: 10 mg/dL (ref 6–20)
CO2: 27 mmol/L (ref 22–32)
CREATININE: 0.81 mg/dL (ref 0.61–1.24)
Calcium: 8.6 mg/dL — ABNORMAL LOW (ref 8.9–10.3)
Chloride: 97 mmol/L — ABNORMAL LOW (ref 101–111)
GFR calc Af Amer: >60 mL/min (ref >60)
GLUCOSE: 122 mg/dL — AB (ref 65–99)
Potassium: 3.6 mmol/L (ref 3.5–5.1)
SODIUM: 135 mmol/L (ref 135–145)

## 2016-04-06 LAB — CBC
HCT: 36 % — ABNORMAL LOW (ref 39.0–52.0)
Hemoglobin: 11.8 g/dL — ABNORMAL LOW (ref 13.0–17.0)
MCH: 30.6 pg (ref 26.0–34.0)
MCHC: 32.8 g/dL (ref 30.0–36.0)
MCV: 93.3 fL (ref 78.0–100.0)
PLATELETS: 262 10*3/uL (ref 150–400)
RBC: 3.86 MIL/uL — ABNORMAL LOW (ref 4.22–5.81)
RDW: 13.5 % (ref 11.5–15.5)
WBC: 5.6 10*3/uL (ref 4.0–10.5)

## 2016-04-06 MED ORDER — FENTANYL 50 MCG/HR TD PT72
50.0000 ug | MEDICATED_PATCH | TRANSDERMAL | Status: DC
  Administered 2016-04-06: 50 ug via TRANSDERMAL
  Filled 2016-04-06: qty 1

## 2016-04-06 MED ORDER — ACETAMINOPHEN 325 MG PO TABS
650.0000 mg | ORAL_TABLET | Freq: Four times a day (QID) | ORAL | Status: DC | PRN
  Administered 2016-04-06 – 2016-04-07 (×2): 650 mg via ORAL
  Filled 2016-04-06: qty 2

## 2016-04-06 MED ORDER — SENNOSIDES-DOCUSATE SODIUM 8.6-50 MG PO TABS
2.0000 | ORAL_TABLET | Freq: Two times a day (BID) | ORAL | Status: DC
  Administered 2016-04-06 – 2016-04-07 (×3): 2 via ORAL
  Filled 2016-04-06 (×3): qty 2

## 2016-04-06 MED ORDER — ZOLPIDEM TARTRATE 5 MG PO TABS
5.0000 mg | ORAL_TABLET | Freq: Every evening | ORAL | Status: DC | PRN
  Administered 2016-04-06: 5 mg via ORAL
  Filled 2016-04-06: qty 1

## 2016-04-06 NOTE — Progress Notes (Signed)
  Subjective: Nauseated but not vomiting.  No bowel movement in 24 hours.  Lots of anxiety without having a daily bowel movement.  Pain control seems reasonable.  Chest x-ray shows some left lower lobe atelectasis.  Otherwise expanded.  Encourage greater incidence from a tree and ambulation and coughing.  Chest tube on waterseal.  240 mL serosanguineous out.  No airleak  Objective: Vital signs in last 24 hours: Temp:  [98.2 F (36.8 C)-98.8 F (37.1 C)] 98.2 F (36.8 C) (05/13 0523) Pulse Rate:  [76-86] 83 (05/13 0523) Resp:  [18-19] 18 (05/13 0523) BP: (154-178)/(75-84) 159/82 mmHg (05/13 0523) SpO2:  [94 %-97 %] 97 % (05/13 0523) Last BM Date: 04/05/16  Intake/Output from previous day: 05/12 0701 - 05/13 0700 In: 1280 [P.O.:1280] Out: 690 [Urine:450; Chest Tube:240] Intake/Output this shift:    General appearance: In bed.  Alert and cooperative.  Wife is in room.  Minimal distress from nausea Resp: Decreased breath sounds with a little egophony  at bases.  Vital capacity 1250.  Chest tube drainage serosanguineous.  No airleak. GI: soft, non-tender; bowel sounds normal; no masses,  no organomegaly  Lab Results:  No results for input(s): WBC, HGB, HCT, PLT in the last 72 hours. BMET No results for input(s): NA, K, CL, CO2, GLUCOSE, BUN, CREATININE, CALCIUM in the last 72 hours. PT/INR No results for input(s): LABPROT, INR in the last 72 hours. ABG No results for input(s): PHART, HCO3 in the last 72 hours.  Invalid input(s): PCO2, PO2  Studies/Results: Dg Chest Port 1 View  04/05/2016  CLINICAL DATA:  Follow-up traumatic hemo pneumothorax with left chest tube treatment EXAM: PORTABLE CHEST 1 VIEW COMPARISON:  Chest x-ray of Apr 04, 2016 FINDINGS: The lungs remain adequately inflated. Significant abnormalities in the left pulmonary apex persist. A small apical pneumothorax is visible today. The left-sided chest tube is in stable position. There is no significant pleural  effusion. The right lung is well-expanded and clear. There is stable density in the right upper lobe the heart is top-normal in size but stable. The pulmonary vascularity is not engorged. Multiple upper left rib fractures are again demonstrated as is the fracture of the distal third of the shaft of the left clavicle. IMPRESSION: Fairly stable appearance of the chest overall. A 5-10 % left apical pneumothorax is visible today likely due to slight changes in positioning. The left-sided chest tube is stable. Electronically Signed   By: David  SwazilandJordan M.D.   On: 04/05/2016 07:27    Anti-infectives: Anti-infectives    None      Assessment/Plan:  Fall from 4 ft/crush injury Multiple left and right rib fractures//PTX/pulmonary contusion- OP still  too high for removal, continue water seal. Pt has audible "pop" on the right side, monitor. May need TCTS consult.  LLL atelectasis.  Push incentive spirometry and ambulation.  Encourage cough and deep breathing.  Chest x-ray tomorrow. Left clavicle fracture-non op per Dr. Magnus IvanBlackman ABL anemia -- Mild Hypothyroidism-home meds FEN - Doing well solely on scheduled tramadol. Try Fleet's.  Added Senokot 2 tabs twice a day did not want anymore MiraLAX VTE - SCD's, Lovenox  Dispo -- PT/OT   LOS: 7 days    Brandon [name] M 04/06/2016

## 2016-04-07 ENCOUNTER — Inpatient Hospital Stay (HOSPITAL_COMMUNITY): Payer: 59

## 2016-04-07 MED ORDER — OXYCODONE HCL 5 MG PO TABS: 5.0000 mg | ORAL_TABLET | Freq: Four times a day (QID) | ORAL | Status: DC | PRN

## 2016-04-07 MED ORDER — TRAMADOL HCL 50 MG PO TABS: 100.0000 mg | ORAL_TABLET | Freq: Four times a day (QID) | ORAL | Status: DC | PRN

## 2016-04-07 MED ORDER — DOCUSATE SODIUM 100 MG PO CAPS: 200.0000 mg | ORAL_CAPSULE | Freq: Two times a day (BID) | ORAL | Status: DC

## 2016-04-07 MED ORDER — ACETAMINOPHEN 325 MG PO TABS: 650.0000 mg | ORAL_TABLET | Freq: Four times a day (QID) | ORAL | Status: DC | PRN

## 2016-04-07 NOTE — Progress Notes (Signed)
Subjective: Stable and alert.  Had a small bowel movement.  Feeling somewhat better. Chest x-ray shows left lower lobe atelectasis.  Lung expanded. Chest tube drainage down to 110 mL per 24 hours. I removed the tube and will get chest x-ray in 4 hours.  He's hoping to go home today   Objective: Vital signs in last 24 hours: Temp:  [98.2 F (36.8 C)-98.5 F (36.9 C)] 98.4 F (36.9 C) (05/14 0515) Pulse Rate:  [77-83] 83 (05/14 0515) Resp:  [18-20] 20 (05/14 0515) BP: (149-164)/(71-94) 162/80 mmHg (05/14 0515) SpO2:  [95 %-98 %] 95 % (05/14 0515) Last BM Date: 04/05/16  Intake/Output from previous day: 05/13 0701 - 05/14 0700 In: -  Out: 635 [Urine:525; Chest Tube:110] Intake/Output this shift:    General appearance: Alert and cooperative.  Only problem is pain in his left clavicle.  Wearing sling. Resp: Decreased breath sounds at bases otherwise clear.  Chest tube drainage serosanguineous.  No air leak. GI: soft, non-tender; bowel sounds normal; no masses,  no organomegaly  Lab Results:   Recent Labs  04/06/16 1042  WBC 5.6  HGB 11.8*  HCT 36.0*  PLT 262   BMET  Recent Labs  04/06/16 1042  NA 135  K 3.6  CL 97*  CO2 27  GLUCOSE 122*  BUN 10  CREATININE 0.81  CALCIUM 8.6*   PT/INR No results for input(s): LABPROT, INR in the last 72 hours. ABG No results for input(s): PHART, HCO3 in the last 72 hours.  Invalid input(s): PCO2, PO2  Studies/Results: Dg Chest Port 1 View  04/06/2016  CLINICAL DATA:  65 year old male with history of traumatic hemo pneumothorax and left clavicle fracture. Left-sided chest tube. History of trauma after falling from a tree. EXAM: PORTABLE CHEST 1 VIEW COMPARISON:  Chest x-ray 04/05/2016. FINDINGS: Left-sided chest tube remains in position with tip and side-port over the lower left hemithorax. Multiple upper left-sided rib fractures and mildly displaced mildly comminuted clavicular fracture again noted. This distorts the left  chest wall. Irregular pleural thickening in the left hemithorax likely reflect the presence of extrapleural hematoma or residual loculated left-sided hemothorax. Right lung is clear. Opacities at the left lung base may reflect areas of atelectasis and/or airspace consolidation in the left lower lobe and lingula, and have increased compared to the prior examination. Small left pleural effusion dependently likely reflects residual dependent hemothorax. No evidence of pulmonary edema. Heart size is normal. Upper mediastinal contours are within normal limits. Although there are several vertical lucencies along the left side of the mediastinum which may reflect a small amount of pneumomediastinum. Trace left apical pneumothorax. No right pneumothorax. Surgical clips near the thoracic inlet, presumably from prior thyroidectomy. Small bone island in the anterior aspect of the right second rib. Multiple additional right-sided rib fractures, better demonstrated on prior chest CT 04/09/2016. IMPRESSION: 1. Left-sided chest tube is stable in position. 2. Small left-sided hemopneumothorax. Probable extensive extrapleural hematoma associated with the left chest wall underlying multiple rib fractures, as above. 3. Worsening aeration in the base of the left lung concerning for increasing airspace consolidation and/or atelectasis in the left lower lobe and lingula. 4. Trace amount of pneumomediastinum. Electronically Signed   By: Trudie Reed M.D.   On: 04/06/2016 12:26    Anti-infectives: Anti-infectives    None      Assessment/Plan:  Fall from 4 ft/crush injury Multiple left and right rib fractures//PTX/pulmonary contusion-  Chest tube removed this morning without incident Chest x-ray in 4 hours  Possible home this evening Left clavicle fracture-non op per Dr. Magnus IvanBlackman ABL anemia -- Mild Hypothyroidism-home meds FEN - Doing well solely on scheduled tramadol. Try Fleet's. Added Senokot 2 tabs twice a day did  not want anymore MiraLAX VTE - SCD's, Lovenox  Dispo -- PT/OT   LOS: 8 days    Joury Allcorn M 04/07/2016

## 2016-04-07 NOTE — Progress Notes (Signed)
Pt discharged to home.  Discharge instructions explained to pt.  Pt has no questions at the time of discharge.  Pt states he has all belongings.  IV removed.  Pt taken off floor via wheelchair by staff.
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
## 2016-04-08 ENCOUNTER — Telehealth (HOSPITAL_COMMUNITY): Payer: Self-pay

## 2016-04-08 NOTE — Telephone Encounter (Signed)
LM to call back.

## 2016-04-09 NOTE — Telephone Encounter (Signed)
Pt reported intermittent pain in right abdomen, no other symptoms, no prior occurances. Suggested he call back in the morning if still present or worse and we'd see him in clinic tomorrow.

## 2016-04-10 ENCOUNTER — Other Ambulatory Visit: Payer: Self-pay | Admitting: General Surgery

## 2016-04-10 ENCOUNTER — Ambulatory Visit
Admission: RE | Admit: 2016-04-10 | Discharge: 2016-04-10 | Disposition: A | Discharge status: Home / Self Care | Payer: 59 | Source: Ambulatory Visit | Attending: General Surgery | Admitting: General Surgery

## 2016-04-10 ENCOUNTER — Telehealth (HOSPITAL_COMMUNITY): Payer: Self-pay

## 2016-04-10 DIAGNOSIS — J942 Hemothorax: Secondary | ICD-10-CM

## 2016-04-10 NOTE — Telephone Encounter (Signed)
Brandon [name] called to say pain was still present but somewhat better. Now having some trouble urinating and has some burning, worried about a UTI. I made appt for him to come in today and he will try to get CXR before that but doesn't think he'll be able to. Have not heard back from insurance regarding the other issue so I assume that's been resolved.

## 2016-04-12 NOTE — Telephone Encounter (Signed)
LM with CXR results, told to call back with questions.

## 2016-04-15 ENCOUNTER — Telehealth (HOSPITAL_COMMUNITY): Payer: Self-pay

## 2016-04-15 NOTE — Telephone Encounter (Signed)
No answer, unable to leave message °

## 2016-04-17 ENCOUNTER — Encounter (HOSPITAL_COMMUNITY): Payer: Self-pay | Admitting: *Deleted

## 2016-04-17 NOTE — Telephone Encounter (Signed)
Got a hold of the patient.  He has an appt with Dr. Rennis ChrisSupple later this week to have surgery on his clavicle.  He has no need for a follow up appointment with us.

## 2016-04-17 NOTE — Progress Notes (Signed)
Pt denies cardiac history, states he had chest pain back in 2007 and had a cath and it was clean. Denies any recent chest pain or sob (prior to traumatic chest injury).   Echo - 08/24/12 - in [facility] Cath - 2007 - [facility]

## 2016-04-18 ENCOUNTER — Ambulatory Visit (HOSPITAL_COMMUNITY)
Admission: RE | Admit: 2016-04-18 | Discharge: 2016-04-18 | Disposition: A | Discharge status: Home / Self Care | Payer: 59 | Source: Ambulatory Visit | Attending: Orthopedic Surgery | Admitting: Orthopedic Surgery

## 2016-04-18 ENCOUNTER — Ambulatory Visit (HOSPITAL_COMMUNITY): Payer: 59 | Admitting: Certified Registered Nurse Anesthetist

## 2016-04-18 ENCOUNTER — Ambulatory Visit (HOSPITAL_COMMUNITY): Payer: 59

## 2016-04-18 ENCOUNTER — Encounter (HOSPITAL_COMMUNITY)
Admission: RE | Disposition: A | Discharge status: Home / Self Care | Payer: Self-pay | Source: Ambulatory Visit | Attending: Orthopedic Surgery

## 2016-04-18 ENCOUNTER — Encounter (HOSPITAL_COMMUNITY): Payer: Self-pay | Admitting: *Deleted

## 2016-04-18 DIAGNOSIS — Z8249 Family history of ischemic heart disease and other diseases of the circulatory system: Secondary | ICD-10-CM | POA: Diagnosis not present

## 2016-04-18 DIAGNOSIS — S42022S Displaced fracture of shaft of left clavicle, sequela: Secondary | ICD-10-CM | POA: Insufficient documentation

## 2016-04-18 DIAGNOSIS — S42002A Fracture of unspecified part of left clavicle, initial encounter for closed fracture: Secondary | ICD-10-CM | POA: Diagnosis present

## 2016-04-18 DIAGNOSIS — Z87891 Personal history of nicotine dependence: Secondary | ICD-10-CM | POA: Insufficient documentation

## 2016-04-18 DIAGNOSIS — G8921 Chronic pain due to trauma: Secondary | ICD-10-CM | POA: Diagnosis not present

## 2016-04-18 DIAGNOSIS — M25512 Pain in left shoulder: Secondary | ICD-10-CM | POA: Diagnosis not present

## 2016-04-18 DIAGNOSIS — Z825 Family history of asthma and other chronic lower respiratory diseases: Secondary | ICD-10-CM | POA: Insufficient documentation

## 2016-04-18 DIAGNOSIS — E7800 Pure hypercholesterolemia, unspecified: Secondary | ICD-10-CM | POA: Diagnosis not present

## 2016-04-18 DIAGNOSIS — Z79899 Other long term (current) drug therapy: Secondary | ICD-10-CM | POA: Diagnosis not present

## 2016-04-18 DIAGNOSIS — W11XXXD Fall on and from ladder, subsequent encounter: Secondary | ICD-10-CM | POA: Insufficient documentation

## 2016-04-18 DIAGNOSIS — Z7982 Long term (current) use of aspirin: Secondary | ICD-10-CM | POA: Insufficient documentation

## 2016-04-18 DIAGNOSIS — Z419 Encounter for procedure for purposes other than remedying health state, unspecified: Secondary | ICD-10-CM

## 2016-04-18 DIAGNOSIS — K449 Diaphragmatic hernia without obstruction or gangrene: Secondary | ICD-10-CM | POA: Insufficient documentation

## 2016-04-18 DIAGNOSIS — Z803 Family history of malignant neoplasm of breast: Secondary | ICD-10-CM | POA: Diagnosis not present

## 2016-04-18 HISTORY — DX: Hemothorax: J94.2

## 2016-04-18 HISTORY — DX: Nausea with vomiting, unspecified: R11.2

## 2016-04-18 HISTORY — PX: ORIF CLAVICULAR FRACTURE: SHX5055

## 2016-04-18 HISTORY — DX: Other specified postprocedural states: Z98.890

## 2016-04-18 SURGERY — OPEN REDUCTION INTERNAL FIXATION (ORIF) CLAVICULAR FRACTURE
Anesthesia: General | Site: Shoulder | Laterality: Left

## 2016-04-18 MED ORDER — ROCURONIUM BROMIDE 100 MG/10ML IV SOLN
INTRAVENOUS | Status: DC | PRN
  Administered 2016-04-18: 50 mg via INTRAVENOUS

## 2016-04-18 MED ORDER — HYDROMORPHONE HCL 1 MG/ML IJ SOLN
INTRAMUSCULAR | Status: AC
  Filled 2016-04-18: qty 1

## 2016-04-18 MED ORDER — ROCURONIUM BROMIDE 50 MG/5ML IV SOLN
INTRAVENOUS | Status: AC
  Filled 2016-04-18: qty 1

## 2016-04-18 MED ORDER — ONDANSETRON HCL 4 MG/2ML IJ SOLN
INTRAMUSCULAR | Status: AC
  Filled 2016-04-18: qty 2

## 2016-04-18 MED ORDER — OXYCODONE-ACETAMINOPHEN 5-325 MG PO TABS: 1.0000 | ORAL_TABLET | ORAL | Status: DC | PRN

## 2016-04-18 MED ORDER — PHENYLEPHRINE HCL 10 MG/ML IJ SOLN
INTRAMUSCULAR | Status: DC | PRN
  Administered 2016-04-18: 160 ug via INTRAVENOUS
  Administered 2016-04-18: 120 ug via INTRAVENOUS

## 2016-04-18 MED ORDER — DEXAMETHASONE SODIUM PHOSPHATE 10 MG/ML IJ SOLN
INTRAMUSCULAR | Status: DC | PRN
  Administered 2016-04-18: 10 mg via INTRAVENOUS

## 2016-04-18 MED ORDER — PROPOFOL 10 MG/ML IV BOLUS
INTRAVENOUS | Status: DC | PRN
  Administered 2016-04-18: 200 mg via INTRAVENOUS

## 2016-04-18 MED ORDER — LIDOCAINE 2% (20 MG/ML) 5 ML SYRINGE
INTRAMUSCULAR | Status: AC
  Filled 2016-04-18: qty 5

## 2016-04-18 MED ORDER — PROPOFOL 500 MG/50ML IV EMUL
INTRAVENOUS | Status: DC | PRN
  Administered 2016-04-18: 25 ug/kg/min via INTRAVENOUS

## 2016-04-18 MED ORDER — CEFAZOLIN SODIUM-DEXTROSE 2-4 GM/100ML-% IV SOLN
2.0000 g | INTRAVENOUS | Status: AC
  Administered 2016-04-18: 2 g via INTRAVENOUS

## 2016-04-18 MED ORDER — CHLORHEXIDINE GLUCONATE 4 % EX LIQD: 60.0000 mL | Freq: Once | CUTANEOUS | Status: DC

## 2016-04-18 MED ORDER — SUGAMMADEX SODIUM 200 MG/2ML IV SOLN
INTRAVENOUS | Status: DC | PRN
  Administered 2016-04-18: 200 mg via INTRAVENOUS

## 2016-04-18 MED ORDER — METOPROLOL TARTARATE 1 MG/ML SYRINGE (5ML)
Status: DC | PRN
  Administered 2016-04-18 (×2): 1 mg via INTRAVENOUS

## 2016-04-18 MED ORDER — ARTIFICIAL TEARS OP OINT
TOPICAL_OINTMENT | OPHTHALMIC | Status: DC | PRN
  Administered 2016-04-18: 1 via OPHTHALMIC

## 2016-04-18 MED ORDER — 0.9 % SODIUM CHLORIDE (POUR BTL) OPTIME
TOPICAL | Status: DC | PRN
  Administered 2016-04-18: 1000 mL

## 2016-04-18 MED ORDER — BUPIVACAINE-EPINEPHRINE (PF) 0.5% -1:200000 IJ SOLN
INTRAMUSCULAR | Status: AC
  Filled 2016-04-18: qty 30

## 2016-04-18 MED ORDER — ARTIFICIAL TEARS OP OINT
TOPICAL_OINTMENT | OPHTHALMIC | Status: AC
  Filled 2016-04-18: qty 3.5

## 2016-04-18 MED ORDER — EPHEDRINE 5 MG/ML INJ
INTRAVENOUS | Status: AC
  Filled 2016-04-18: qty 10

## 2016-04-18 MED ORDER — HYDROMORPHONE HCL 1 MG/ML IJ SOLN
INTRAMUSCULAR | Status: AC
  Administered 2016-04-18: 0.5 mg via INTRAVENOUS
  Filled 2016-04-18: qty 1

## 2016-04-18 MED ORDER — FENTANYL CITRATE (PF) 250 MCG/5ML IJ SOLN
INTRAMUSCULAR | Status: DC | PRN
  Administered 2016-04-18: 100 ug via INTRAVENOUS
  Administered 2016-04-18: 25 ug via INTRAVENOUS
  Administered 2016-04-18: 50 ug via INTRAVENOUS
  Administered 2016-04-18: 25 ug via INTRAVENOUS
  Administered 2016-04-18: 50 ug via INTRAVENOUS

## 2016-04-18 MED ORDER — LACTATED RINGERS IV SOLN
INTRAVENOUS | Status: DC
  Administered 2016-04-18 (×2): via INTRAVENOUS

## 2016-04-18 MED ORDER — CEFAZOLIN SODIUM-DEXTROSE 2-4 GM/100ML-% IV SOLN
INTRAVENOUS | Status: AC
  Filled 2016-04-18: qty 100

## 2016-04-18 MED ORDER — ONDANSETRON HCL 4 MG PO TABS: 4.0000 mg | ORAL_TABLET | Freq: Three times a day (TID) | ORAL | Status: DC | PRN

## 2016-04-18 MED ORDER — PHENYLEPHRINE 40 MCG/ML (10ML) SYRINGE FOR IV PUSH (FOR BLOOD PRESSURE SUPPORT)
PREFILLED_SYRINGE | INTRAVENOUS | Status: AC
  Filled 2016-04-18: qty 10

## 2016-04-18 MED ORDER — GLYCOPYRROLATE 0.2 MG/ML IJ SOLN
INTRAMUSCULAR | Status: DC | PRN
  Administered 2016-04-18: 0.1 mg via INTRAVENOUS

## 2016-04-18 MED ORDER — DIAZEPAM 5 MG PO TABS: 2.5000 mg | ORAL_TABLET | Freq: Four times a day (QID) | ORAL | Status: DC | PRN

## 2016-04-18 MED ORDER — EPHEDRINE SULFATE 50 MG/ML IJ SOLN
INTRAMUSCULAR | Status: DC | PRN
  Administered 2016-04-18: 10 mg via INTRAVENOUS

## 2016-04-18 MED ORDER — BUPIVACAINE-EPINEPHRINE 0.5% -1:200000 IJ SOLN
INTRAMUSCULAR | Status: DC | PRN
  Administered 2016-04-18: 30 mL

## 2016-04-18 MED ORDER — GLYCOPYRROLATE 0.2 MG/ML IV SOSY
PREFILLED_SYRINGE | INTRAVENOUS | Status: AC
  Filled 2016-04-18: qty 3

## 2016-04-18 MED ORDER — SUGAMMADEX SODIUM 200 MG/2ML IV SOLN
INTRAVENOUS | Status: AC
  Filled 2016-04-18: qty 2

## 2016-04-18 MED ORDER — LIDOCAINE HCL (CARDIAC) 20 MG/ML IV SOLN
INTRAVENOUS | Status: DC | PRN
  Administered 2016-04-18: 60 mg via INTRATRACHEAL

## 2016-04-18 MED ORDER — METOPROLOL TARTRATE 5 MG/5ML IV SOLN
INTRAVENOUS | Status: AC
  Filled 2016-04-18: qty 5

## 2016-04-18 MED ORDER — PROPOFOL 10 MG/ML IV BOLUS
INTRAVENOUS | Status: AC
  Filled 2016-04-18: qty 20

## 2016-04-18 MED ORDER — HYDROMORPHONE HCL 1 MG/ML IJ SOLN
0.2500 mg | INTRAMUSCULAR | Status: DC | PRN
  Administered 2016-04-18 (×2): 0.5 mg via INTRAVENOUS

## 2016-04-18 MED ORDER — FENTANYL CITRATE (PF) 250 MCG/5ML IJ SOLN
INTRAMUSCULAR | Status: AC
  Filled 2016-04-18: qty 5

## 2016-04-18 MED ORDER — ONDANSETRON HCL 4 MG/2ML IJ SOLN
INTRAMUSCULAR | Status: DC | PRN
  Administered 2016-04-18: 4 mg via INTRAVENOUS

## 2016-04-18 SURGICAL SUPPLY — 60 items
BIT DRILL 2.3 QUICK RELEASE (BIT) ×1 IMPLANT
BIT DRILL 2.8X5 QR DISP (BIT) ×2 IMPLANT
BIT DRILL QUICK RELEASE 2.0MM (INSTRUMENTS) ×1 IMPLANT
COVER SURGICAL LIGHT HANDLE (MISCELLANEOUS) ×2 IMPLANT
DERMABOND ADVANCED (GAUZE/BANDAGES/DRESSINGS) ×1
DERMABOND ADVANCED .7 DNX12 (GAUZE/BANDAGES/DRESSINGS) ×1 IMPLANT
DRAPE C-ARM 42X72 X-RAY (DRAPES) ×2 IMPLANT
DRAPE ORTHO SPLIT 77X108 STRL (DRAPES) ×2
DRAPE SURG 17X23 STRL (DRAPES) ×2 IMPLANT
DRAPE SURG ORHT 6 SPLT 77X108 (DRAPES) ×2 IMPLANT
DRAPE U-SHAPE 47X51 STRL (DRAPES) ×4 IMPLANT
DRESSING AQUACEL AG SP 3.5X4 (GAUZE/BANDAGES/DRESSINGS) ×1 IMPLANT
DRESSING AQUACEL AG SP 3.5X6 (GAUZE/BANDAGES/DRESSINGS) ×1 IMPLANT
DRILL 2.3 QUICK RELEASE (BIT) ×2
DRILL QUICK RELEASE 2.0MM (INSTRUMENTS) ×2
DRSG AQUACEL AG SP 3.5X4 (GAUZE/BANDAGES/DRESSINGS) ×2
DRSG AQUACEL AG SP 3.5X6 (GAUZE/BANDAGES/DRESSINGS) ×2
DURAPREP 26ML APPLICATOR (WOUND CARE) ×2 IMPLANT
ELECT CAUTERY BLADE 6.4 (BLADE) ×2 IMPLANT
ELECT REM PT RETURN 9FT ADLT (ELECTROSURGICAL) ×2
ELECTRODE REM PT RTRN 9FT ADLT (ELECTROSURGICAL) ×1 IMPLANT
GLOVE BIO SURGEON STRL SZ7.5 (GLOVE) ×2 IMPLANT
GLOVE BIO SURGEON STRL SZ8 (GLOVE) ×2 IMPLANT
GLOVE EUDERMIC 7 POWDERFREE (GLOVE) ×2 IMPLANT
GLOVE SS BIOGEL STRL SZ 7.5 (GLOVE) ×1 IMPLANT
GLOVE SUPERSENSE BIOGEL SZ 7.5 (GLOVE) ×1
GOWN STRL REUS W/ TWL LRG LVL3 (GOWN DISPOSABLE) ×2 IMPLANT
GOWN STRL REUS W/ TWL XL LVL3 (GOWN DISPOSABLE) ×2 IMPLANT
GOWN STRL REUS W/TWL LRG LVL3 (GOWN DISPOSABLE) ×2
GOWN STRL REUS W/TWL XL LVL3 (GOWN DISPOSABLE) ×2
KIT BASIN OR (CUSTOM PROCEDURE TRAY) ×2 IMPLANT
KIT ROOM TURNOVER OR (KITS) ×2 IMPLANT
MANIFOLD NEPTUNE II (INSTRUMENTS) ×2 IMPLANT
NEEDLE HYPO 25GX1X1/2 BEV (NEEDLE) ×2 IMPLANT
NS IRRIG 1000ML POUR BTL (IV SOLUTION) ×2 IMPLANT
PACK SHOULDER (CUSTOM PROCEDURE TRAY) ×2 IMPLANT
PAD ARMBOARD 7.5X6 YLW CONV (MISCELLANEOUS) ×2 IMPLANT
PLATE CLAVICLE 10 HOLE (Plate) ×2 IMPLANT
RESTRAINT HEAD UNIVERSAL NS (MISCELLANEOUS) ×2 IMPLANT
SCREW CORTICAL 2.3X14 (Screw) ×2 IMPLANT
SCREW HEXALOBE LOCKING 3.5X14M (Screw) ×4 IMPLANT
SCREW HEXALOBE NON-LOCK 3.5X14 (Screw) ×2 IMPLANT
SCREW LOCK 12X3.5X HEXALOBE (Screw) ×2 IMPLANT
SCREW LOCKING 3.5X12 (Screw) ×2 IMPLANT
SCREW NON LOCK 3.5X10MM (Screw) ×2 IMPLANT
SCREW NON TOGG 2.3X16MM (Screw) ×2 IMPLANT
SCREW NONLOCK HEX 3.5X12 (Screw) ×4 IMPLANT
SLING ARM FOAM STRAP LRG (SOFTGOODS) ×2 IMPLANT
SPONGE LAP 18X18 X RAY DECT (DISPOSABLE) ×4 IMPLANT
SPONGE LAP 4X18 X RAY DECT (DISPOSABLE) ×4 IMPLANT
SUCTION FRAZIER HANDLE 10FR (MISCELLANEOUS) ×1
SUCTION TUBE FRAZIER 10FR DISP (MISCELLANEOUS) ×1 IMPLANT
SUT MNCRL AB 3-0 PS2 18 (SUTURE) ×2 IMPLANT
SUT MON AB 2-0 CT1 27 (SUTURE) ×2 IMPLANT
SUT VIC AB 1 CT1 27 (SUTURE) ×1
SUT VIC AB 1 CT1 27XBRD ANBCTR (SUTURE) ×1 IMPLANT
SYR CONTROL 10ML LL (SYRINGE) ×2 IMPLANT
TOWEL OR 17X24 6PK STRL BLUE (TOWEL DISPOSABLE) ×2 IMPLANT
TOWEL OR 17X26 10 PK STRL BLUE (TOWEL DISPOSABLE) ×2 IMPLANT
YANKAUER SUCT BULB TIP NO VENT (SUCTIONS) ×2 IMPLANT

## 2016-04-18 NOTE — Discharge Instructions (Signed)
Ok to allow arm to dangle and to move it for hygiene. Sling on but can remove to allow arm to dangle and move elbow wrist and hand. Leave current dressing on until follow up  You may shower your dressing is waterproof  Call [phone] for any questions or concerns

## 2016-04-18 NOTE — Transfer of Care (Signed)
Immediate Anesthesia Transfer of Care Note  Patient: Brandon [name]  Procedure(s) Performed: Procedure(s): OPEN REDUCTION INTERNAL FIXATION (ORIF) LEFT CLAVICLE FRACTURE (Left)  Patient Location: PACU  Anesthesia Type:General  Level of Consciousness: awake, alert  and patient cooperative  Airway & Oxygen Therapy: Patient Spontanous Breathing and Patient connected to face mask oxygen  Post-op Assessment: Report given to RN, Post -op Vital signs reviewed and stable, Patient moving all extremities X 4 and Patient able to stick tongue midline  Post vital signs: Reviewed and stable  Last Vitals:  Filed Vitals:   04/18/16 0959 04/18/16 1424  BP: 154/99   Pulse: 81   Temp: 36.8 C 37.1 C  Resp: 18     Last Pain:  Filed Vitals:   04/18/16 1426  PainSc: 2       Patients Stated Pain Goal: 3 (04/18/16 0959)  Complications: No apparent anesthesia complications

## 2016-04-18 NOTE — Anesthesia Preprocedure Evaluation (Signed)
Anesthesia Evaluation  Patient identified by MRN, date of birth, ID band Patient awake    History of Anesthesia Complications (+) PONV  Airway Mallampati: II  TM Distance: >3 FB Neck ROM: Full    Dental  (+) Teeth Intact, Dental Advisory Given   Pulmonary former smoker,    breath sounds clear to auscultation       Cardiovascular negative cardio ROS   Rhythm:Regular Rate:Normal     Neuro/Psych  Neuromuscular disease    GI/Hepatic hiatal hernia,   Endo/Other  negative endocrine ROS  Renal/GU negative Renal ROS     Musculoskeletal negative musculoskeletal ROS (+)   Abdominal   Peds  Hematology   Anesthesia Other Findings   Reproductive/Obstetrics                             Anesthesia Physical Anesthesia Plan  ASA: II  Anesthesia Plan: General   Post-op Pain Management:    Induction: Intravenous  Airway Management Planned: Oral ETT  Additional Equipment:   Intra-op Plan:   Post-operative Plan: Extubation in OR  Informed Consent: I have reviewed the patients History and Physical, chart, labs and discussed the procedure including the risks, benefits and alternatives for the proposed anesthesia with the patient or authorized representative who has indicated his/her understanding and acceptance.   Dental advisory given  Plan Discussed with: CRNA and Surgeon  Anesthesia Plan Comments:         Anesthesia Quick Evaluation

## 2016-04-18 NOTE — Anesthesia Procedure Notes (Addendum)
Procedure Name: Intubation Date/Time: 04/18/2016 12:15 PM Performed by: Salomon MastWALL, Doshia Dalia COREY Pre-anesthesia Checklist: Patient identified, Emergency Drugs available, Suction available and Patient being monitored Patient Re-evaluated:Patient Re-evaluated prior to inductionOxygen Delivery Method: Circle system utilized Preoxygenation: Pre-oxygenation with 100% oxygen Intubation Type: IV induction Ventilation: Oral airway inserted - appropriate to patient size Laryngoscope Size: Hyacinth MeekerMiller and 3 (MAC3 by CRNA, miller per MDA) Grade View: Grade II Tube size: 7.0 mm Number of attempts: 1 Placement Confirmation: ETT inserted through vocal cords under direct vision,  positive ETCO2 and breath sounds checked- equal and bilateral Secured at: 23 cm Dental Injury: Teeth and Oropharynx as per pre-operative assessment  Difficulty Due To: Difficult Airway- due to anterior larynx Comments: Placed by MDA

## 2016-04-18 NOTE — H&P (Signed)
Brandon [name]    Chief Complaint: DISPLACED LEFT CLAVICLE FRACTURE  HPI: The patient is a 65 y.o. male with a severely shortened left clavicle fracture  Past Medical History  Diagnosis Date  . Chest pain 2007    none since  . Hiatal hernia   . Gallbladder disorder   . Hypercholesteremia   . Hemopneumothorax on left   . PONV (postoperative nausea and vomiting)     also has trouble waking up    Past Surgical History  Procedure Laterality Date  . Cardiac catheterization    . Thyroidectomy    . Tonsillectomy    . Colonoscopy      Family History  Problem Relation Age of Onset  . Hypertension Mother   . Breast cancer Mother   . Heart disease Father   . Emphysema Father     Social History:  reports that he quit smoking about 37 years ago. He has never used smokeless tobacco. He reports that he drinks alcohol. He reports that he does not use illicit drugs.   Medications Prior to Admission  Medication Sig Dispense Refill  . acetaminophen (TYLENOL) 325 MG tablet Take 2 tablets (650 mg total) by mouth every 6 (six) hours as needed for mild pain or moderate pain (or temp > 100). 120 tablet 3  . NATURE-THROID 97.5 MG TABS Take 97.5-195 mg by mouth 2 (two) times daily. Take 2 tablets in the morning before breakfast and 1 tablet in the afternoon on an empty stomach    . traMADol (ULTRAM) 50 MG tablet Take 2 tablets (100 mg total) by mouth every 6 (six) hours as needed for moderate pain. May take one or two tablets as needed (Patient taking differently: Take 50-100 mg by mouth every 6 (six) hours as needed for moderate pain. 100 mg every morning and 50 mg at 1200) 60 tablet 0  . triamterene-hydrochlorothiazide (MAXZIDE-25) 37.5-25 MG tablet Take 1 tablet by mouth daily.    Marland Kitchen. docusate sodium (COLACE) 100 MG capsule Take 2 capsules (200 mg total) by mouth 2 (two) times daily. 10 capsule 0  . Multiple Vitamin (MULTIVITAMIN WITH MINERALS) TABS Take 1 tablet by mouth daily.    Marland Kitchen. testosterone  cypionate (DEPOTESTOSTERONE CYPIONATE) 200 MG/ML injection Inject 120 mg into the muscle every 14 (fourteen) days.        Physical Exam: left shoulder with diffuse tenderness along the clavicular shaft, marked foreshortening of the left shoulder, skin intact, N/V intact distally  Vitals  Temp:  [98.3 F (36.8 C)] 98.3 F (36.8 C) (05/25 0959) Pulse Rate:  [81] 81 (05/25 0959) Resp:  [18] 18 (05/25 0959) BP: (154)/(99) 154/99 mmHg (05/25 0959) SpO2:  [98 %] 98 % (05/25 0959) Weight:  [91.264 kg (201 lb 3.2 oz)] 91.264 kg (201 lb 3.2 oz) (05/25 0959)  Assessment/Plan  Impression: DISPLACED LEFT CLAVICLE FRACTURE   Plan of Action: Procedure(s): OPEN REDUCTION INTERNAL FIXATION (ORIF) LEFT CLAVICLE FRACTURE  Rielle Schlauch M Malikye Reppond 04/18/2016, 11:23 AM Contact # [phone]

## 2016-04-18 NOTE — Anesthesia Postprocedure Evaluation (Signed)
Anesthesia Post Note  Patient: Brandon [name]  Procedure(s) Performed: Procedure(s) (LRB): OPEN REDUCTION INTERNAL FIXATION (ORIF) LEFT CLAVICLE FRACTURE (Left)  Patient location during evaluation: PACU Anesthesia Type: General Level of consciousness: awake and alert Pain management: pain level controlled Vital Signs Assessment: post-procedure vital signs reviewed and stable Respiratory status: spontaneous breathing, nonlabored ventilation, respiratory function stable and patient connected to nasal cannula oxygen Cardiovascular status: blood pressure returned to baseline and stable Postop Assessment: no signs of nausea or vomiting Anesthetic complications: no    Last Vitals:  Filed Vitals:   04/18/16 1517 04/18/16 1518  BP:    Pulse: 104 104  Temp:    Resp: 17 11    Last Pain:  Filed Vitals:   04/18/16 1522  PainSc: 4                  Syre Knerr,JAMES TERRILL

## 2016-04-18 NOTE — Op Note (Signed)
04/18/2016  2:01 PM  PATIENT:   Brandon [name]  65 y.o. male  PRE-OPERATIVE DIAGNOSIS:  DISPLACED LEFT CLAVICLE FRACTURE   POST-OPERATIVE DIAGNOSIS:  same  PROCEDURE:  ORIF  SURGEON:  Fumi Guadron, Vania ReaKevin M. M.D.  ASSISTANTS: Shuford pac   ANESTHESIA:   GET + local  EBL: 100  SPECIMEN:  none  Drains: none   PATIENT DISPOSITION:  PACU - hemodynamically stable.    PLAN OF CARE: Discharge to home after PACU  Dictation# [id number]   Contact # [phone]

## 2016-04-19 ENCOUNTER — Encounter (HOSPITAL_COMMUNITY): Payer: Self-pay | Admitting: Orthopedic Surgery

## 2016-04-19 NOTE — Op Note (Signed)
NAMEALIF, PETRAK NO.:  [id number]  MEDICAL RECORD NO.:  [id number]  LOCATION:  [facility]                         FACILITY:  [facility]  PHYSICIAN:  Vania Rea. Mahi Zabriskie, M.D.  DATE OF BIRTH:  1951-11-02  DATE OF PROCEDURE:  04/18/2016 DATE OF DISCHARGE:  04/18/2016                              OPERATIVE REPORT   PREOPERATIVE DIAGNOSIS:  Severely displaced left midshaft clavicle fracture with comminution.  POSTOPERATIVE DIAGNOSIS:  Severely displaced left midshaft clavicle fracture with comminution.  PROCEDURE:  Open reduction and internal fixation of severely displaced and shortened left midshaft clavicle fracture with comminution.  SURGEON:  Vania Rea. Kadesha Virrueta, M.D.  Threasa HeadsFrench Ana A. Shuford, P.A.-C.  ANESTHESIA:  General endotracheal as well as local.  ESTIMATED BLOOD LOSS:  100 mL.  DRAINS:  None.  HISTORY:  Mr. Borchardt is a 65 year old gentleman who had an original injury almost 3 years ago when he was trimming a tree, while standing on a ladder and portion was caught by following them, thrown to the ground, sustained multiple left rib fractures, pneumothorax and comminuted left clavicle fracture.  His pneumothorax took precedence, he was admitted to the hospital for several days with a chest tube.  Subsequently was stabilized with improved pulmonary status and discharged home and ultimately followed up in our office with concerns regarding ongoing significant pain and marked foreshortening of the left shoulder girdle. His examination showed diffuse swelling about the clavicular region for shortening of the clavicular length.  Obvious asymmetry on clinical examination, grossly neurovascularly intact with radiographs showing a markedly shortened and displaced midshaft clavicle fracture with significant comminution.  Due to the degree of displacement and shortening, he was brought to the operating room at this time for planned open reduction and internal  fixation.  Preoperatively, I counseled Mr. Vineyard regarding treatment options and potential risks versus benefits thereof.  Possible surgical complications were all reviewed including bleeding, infection, neurovascular injury, malunion, nonunion, loss of fixation, anesthetic complication and possible need for additional surgery.  He understands and accepts and agrees with plan.  PROCEDURE IN DETAIL:  After undergoing routine preop evaluation, the patient received prophylactic antibiotics, brought to the operating room and placed supine on the operating table and underwent smooth induction of a general endotracheal anesthesia.  Placed in beach-chair position and appropriately padded and protected.  The left shoulder girdle region was sterilely prepped and draped in standard fashion.  Time-out was called.  A transverse 10-cm incision was made just below the subcutaneous margin of the clavicle, centered about the fracture site. Skin flaps were elevated and electrocautery was used for hemostasis. The dissection was then carried deeply and the clavipectoral fascia was then divided along the subcutaneous border of the clavicle and allowing [place] to expose the clavicle in a subperiosteal fashion.  Of note, there was significant posterior displacement of the lateral half of the clavicle, marked comminution and the fracture is almost 75 weeks old, so there was significant callus formation that needed to be dissected through and debrided such that we could identify the overall fracture anatomy.  There were several large comminuted pieces, which we carefully protected and preserved.  Once we had freed up the  callus and scar tissue, we were able to mobilize the fracture and regain proper alignment and reestablish appropriate length.  We used a series of various bone-holding clamp, such that we could obtain overall appropriate alignment and reduction.  Fluoroscopic imaging was then used to confirm  overall position and alignment.  We selected the appropriate plate from the [employer] plate system, this was then placed over the dorsal aspect of the clavicle, we confirmed proper fitting and fixation and then performed some provisional fixation.  We were satisfied with the overall alignment.  We completed the fixation with combination of locking and nonlocking screws and in addition, I placed two lag screws at the dominant fracture zone from front to back using 2.3 screws.  Once this was completed, we were very pleased with the overall stability of the construct and the overall alignment.  Based on direct visualization, the radiographic imaging also showed good position of the fracture site, good position of the hardware.  With the wound was then copiously irrigated, hemostasis was obtained.  It was closed with a figure-of- eight #1 Vicryl for the deep fascial periosteal layers, 2-0 Vicryl for the subcu layer, intracuticular 3-0 Monocryl for the skin followed by Dermabond and an Aquacel dressing.  Left arm was placed in a sling.  The patient was awakened, extubated, and taken to the recovery room in stable condition.  Ralene Batheracy Shuford, PA-C was used as an Geophysicist/field seismologistassistant throughout this case, essential for help with positioning the patient, positioning the extremity, manipulation of the fracture site, wound closure and intraoperative decision making.     Vania ReaKevin M. Cinthia Rodden, M.D.     KMS/MEDQ  D:  04/18/2016  T:  04/19/2016  Job:  [id number]

## 2017-03-06 ENCOUNTER — Other Ambulatory Visit: Payer: Self-pay | Admitting: Family Medicine

## 2017-03-06 DIAGNOSIS — R945 Abnormal results of liver function studies: Principal | ICD-10-CM

## 2017-03-06 DIAGNOSIS — R7989 Other specified abnormal findings of blood chemistry: Secondary | ICD-10-CM

## 2017-03-07 ENCOUNTER — Ambulatory Visit
Admission: RE | Admit: 2017-03-07 | Discharge: 2017-03-07 | Disposition: A | Discharge status: Home / Self Care | Payer: 59 | Source: Ambulatory Visit | Attending: Family Medicine | Admitting: Family Medicine

## 2017-03-07 DIAGNOSIS — R7989 Other specified abnormal findings of blood chemistry: Secondary | ICD-10-CM

## 2017-03-07 DIAGNOSIS — R945 Abnormal results of liver function studies: Principal | ICD-10-CM

## 2017-03-10 ENCOUNTER — Emergency Department (HOSPITAL_COMMUNITY): Payer: 59

## 2017-03-10 ENCOUNTER — Observation Stay (HOSPITAL_COMMUNITY)
Admission: EM | Admit: 2017-03-10 | Discharge: 2017-03-11 | Disposition: A | Discharge status: Home / Self Care | Payer: 59 | Attending: Internal Medicine | Admitting: Internal Medicine

## 2017-03-10 ENCOUNTER — Encounter (HOSPITAL_COMMUNITY): Payer: Self-pay | Admitting: Emergency Medicine

## 2017-03-10 DIAGNOSIS — E039 Hypothyroidism, unspecified: Secondary | ICD-10-CM | POA: Diagnosis present

## 2017-03-10 DIAGNOSIS — Z5181 Encounter for therapeutic drug level monitoring: Secondary | ICD-10-CM | POA: Diagnosis not present

## 2017-03-10 DIAGNOSIS — R945 Abnormal results of liver function studies: Secondary | ICD-10-CM | POA: Diagnosis present

## 2017-03-10 DIAGNOSIS — K7689 Other specified diseases of liver: Principal | ICD-10-CM | POA: Insufficient documentation

## 2017-03-10 DIAGNOSIS — T7840XA Allergy, unspecified, initial encounter: Secondary | ICD-10-CM | POA: Diagnosis present

## 2017-03-10 DIAGNOSIS — R7989 Other specified abnormal findings of blood chemistry: Secondary | ICD-10-CM | POA: Diagnosis present

## 2017-03-10 DIAGNOSIS — Z79899 Other long term (current) drug therapy: Secondary | ICD-10-CM | POA: Insufficient documentation

## 2017-03-10 DIAGNOSIS — Z87891 Personal history of nicotine dependence: Secondary | ICD-10-CM | POA: Diagnosis not present

## 2017-03-10 DIAGNOSIS — I10 Essential (primary) hypertension: Secondary | ICD-10-CM | POA: Diagnosis present

## 2017-03-10 LAB — CBC WITH DIFFERENTIAL/PLATELET
Basophils Absolute: 0 10*3/uL (ref 0.0–0.1)
Basophils Relative: 0 %
EOS ABS: 0.4 10*3/uL (ref 0.0–0.7)
Eosinophils Relative: 8 %
HEMATOCRIT: 44.2 % (ref 39.0–52.0)
Hemoglobin: 15.2 g/dL (ref 13.0–17.0)
Lymphocytes Relative: 29 %
Lymphs Abs: 1.3 10*3/uL (ref 0.7–4.0)
MCH: 31.3 pg (ref 26.0–34.0)
MCHC: 34.4 g/dL (ref 30.0–36.0)
MCV: 90.9 fL (ref 78.0–100.0)
MONO ABS: 0.5 10*3/uL (ref 0.1–1.0)
MONOS PCT: 11 %
NEUTROS ABS: 2.3 10*3/uL (ref 1.7–7.7)
Neutrophils Relative %: 52 %
Platelets: 253 10*3/uL (ref 150–400)
RBC: 4.86 MIL/uL (ref 4.22–5.81)
RDW: 15 % (ref 11.5–15.5)
WBC: 4.5 10*3/uL (ref 4.0–10.5)

## 2017-03-10 LAB — BASIC METABOLIC PANEL
Anion gap: 11 (ref 5–15)
BUN: 19 mg/dL (ref 6–20)
CALCIUM: 9 mg/dL (ref 8.9–10.3)
CO2: 27 mmol/L (ref 22–32)
CREATININE: 0.76 mg/dL (ref 0.61–1.24)
Chloride: 97 mmol/L — ABNORMAL LOW (ref 101–111)
GFR calc non Af Amer: >60 mL/min (ref >60)
Glucose, Bld: 98 mg/dL (ref 65–99)
Potassium: 4.9 mmol/L (ref 3.5–5.1)
Sodium: 135 mmol/L (ref 135–145)

## 2017-03-10 LAB — URINALYSIS, ROUTINE W REFLEX MICROSCOPIC
Bilirubin Urine: NEGATIVE
Glucose, UA: NEGATIVE mg/dL
HGB URINE DIPSTICK: NEGATIVE
KETONES UR: NEGATIVE mg/dL
Leukocytes, UA: NEGATIVE
NITRITE: NEGATIVE
PROTEIN: NEGATIVE mg/dL
SPECIFIC GRAVITY, URINE: 1.014 (ref 1.005–1.030)
pH: 7 (ref 5.0–8.0)

## 2017-03-10 LAB — LIPASE, BLOOD: Lipase: 33 U/L (ref 11–51)

## 2017-03-10 LAB — HEPATIC FUNCTION PANEL
ALBUMIN: 3.5 g/dL (ref 3.5–5.0)
ALT: 760 U/L — ABNORMAL HIGH (ref 17–63)
AST: 436 U/L — ABNORMAL HIGH (ref 15–41)
Alkaline Phosphatase: 236 U/L — ABNORMAL HIGH (ref 38–126)
BILIRUBIN DIRECT: 3.2 mg/dL — AB (ref 0.1–0.5)
BILIRUBIN INDIRECT: 3.5 mg/dL — AB (ref 0.3–0.9)
BILIRUBIN TOTAL: 6.7 mg/dL — AB (ref 0.3–1.2)
Total Protein: 7 g/dL (ref 6.5–8.1)

## 2017-03-10 LAB — ACETAMINOPHEN LEVEL

## 2017-03-10 LAB — MONONUCLEOSIS SCREEN: MONO SCREEN: NEGATIVE

## 2017-03-10 LAB — ETHANOL: Alcohol, Ethyl (B): <5 mg/dL (ref <5)

## 2017-03-10 MED ORDER — IOPAMIDOL (ISOVUE-300) INJECTION 61%
INTRAVENOUS | Status: AC
  Filled 2017-03-10: qty 100

## 2017-03-10 MED ORDER — DIPHENHYDRAMINE HCL 50 MG/ML IJ SOLN
25.0000 mg | Freq: Once | INTRAMUSCULAR | Status: AC
  Administered 2017-03-10: 25 mg via INTRAVENOUS
  Filled 2017-03-10: qty 1

## 2017-03-10 MED ORDER — SODIUM CHLORIDE 0.9 % IV BOLUS (SEPSIS)
1000.0000 mL | Freq: Once | INTRAVENOUS | Status: AC
  Administered 2017-03-10: 1000 mL via INTRAVENOUS

## 2017-03-10 MED ORDER — HYDROCORTISONE NA SUCCINATE PF 100 MG IJ SOLR
200.0000 mg | Freq: Once | INTRAMUSCULAR | Status: AC
  Administered 2017-03-10: 200 mg via INTRAVENOUS
  Filled 2017-03-10 (×2): qty 4

## 2017-03-10 MED ORDER — IOPAMIDOL (ISOVUE-300) INJECTION 61%
100.0000 mL | Freq: Once | INTRAVENOUS | Status: AC | PRN
  Administered 2017-03-10: 100 mL via INTRAVENOUS

## 2017-03-10 NOTE — ED Triage Notes (Signed)
Pt was sent here from PCP for elevated liver function tests.  Pt reports dark urine.  PCP states the patient has high bilirubin levels for the 2nd time and they continue to increase. Rare alcohol drinker. Pt slightly jaundiced upon assessment.

## 2017-03-10 NOTE — ED Provider Notes (Signed)
[facility] DEPT Provider Note   CSN: [id number] Arrival date & time: 03/10/17  1909     History   Chief Complaint Chief Complaint  Patient presents with  . High Liver Function Tests  . [name]    HPI [name] is a 66 y.o. male all of a sudden felt here presenting with worsening [name], [facility] [name] to the office and had a UA that showed some bilirubin in the urine. She has lab drawn that showed bilirubin 4.7, AP 225, AST 189, ALT 525 and Acute hepatitis was negative. He states that over the last several days, he has more itchiness and develop a rash. He has been taking Benadryl round-the-clock with a minimal relief. He [name] back to see his primary care doctor and had labs drawn today that showed T bili 8.2, AP 281, AST 406, ALT 783. Dr. Bosie Clos was consulted and patient was sent to the ED for further evaluation. Denies weight loss. Denies alcohol use, and takes alkeselzer with tylenol occasionally and definitely didn't overdose on tylenol. Had RUQ [place] last week that was unremarkable.   The history is provided by the patient.    Past Medical History:  Diagnosis Date  . Chest pain 2007   none since  . Gallbladder disorder   . Hemopneumothorax on left   . Hiatal hernia   . Hypercholesteremia   . PONV (postoperative nausea and vomiting)    also has trouble waking up    Patient Active Problem List   Diagnosis Date Noted  . Blunt chest trauma 04/02/2016  . Acute urinary retention 04/02/2016  . Acute blood loss anemia 04/02/2016  . Fracture of left clavicle 03/30/2016  . Multiple fractures of ribs of left side 03/30/2016  . Left pulmonary contusion 03/30/2016  . Hemopneumothorax, left 03/30/2016  . Hiatal hernia   . Gallbladder disorder   . Hypercholesteremia   . Chest pain 08/06/2012  . [facility] BP 08/06/2012    Past Surgical History:  Procedure Laterality Date  . CARDIAC CATHETERIZATION     . COLONOSCOPY    . ORIF CLAVICULAR FRACTURE Left 04/18/2016   Procedure: OPEN REDUCTION INTERNAL FIXATION (ORIF) LEFT CLAVICLE FRACTURE;  Surgeon: Francena Hanly, MD;  Location: [facility] OR;  Service: Orthopedics;  Laterality: Left;  . THYROIDECTOMY    . TONSILLECTOMY         Home Medications    Prior to Admission medications   Medication Sig Start Date End Date Taking? Authorizing Provider  acidophilus (RISAQUAD) CAPS capsule Take 1 capsule by mouth daily.   Yes Historical Provider, MD  cholecalciferol (VITAMIN D) 1000 units tablet Take 1,000 Units by mouth daily.   Yes Historical Provider, MD  Menaquinone-7 (VITAMIN K2) 100 MCG CAPS Take 100 mcg by mouth daily.   Yes Historical Provider, MD  testosterone cypionate (DEPOTESTOSTERONE CYPIONATE) 200 MG/ML injection Inject 120 mg into the muscle every Sunday.    Yes Historical Provider, MD  thyroid (ARMOUR) 65 MG tablet Take 97.5-130 mg by mouth 2 (two) times daily. Pt takes two tablets in the morning and one and one-half tablet in the afternoon.   Yes Historical Provider, MD  triamterene-hydrochlorothiazide (DYAZIDE) 37.5-25 MG capsule Take 1 capsule by mouth daily.   Yes Historical Provider, MD  vitamin B-12 (CYANOCOBALAMIN) 1000 MCG tablet Take 1,000 mcg by mouth daily.   Yes Historical Provider, MD    Family History Family History  Problem Relation Age of Onset  .  Hypertension Mother   . Breast cancer Mother   . Heart disease Father   . Emphysema Father     Social History Social History  Substance Use Topics  . Smoking status: Former Smoker    Quit date: 04/18/1979  . Smokeless tobacco: Never Used  . Alcohol use Yes     Comment: occasional     Allergies   Ceftin [cefuroxime axetil]; Oxycodone; and Iohexol   Review of Systems Review of Systems  Constitutional: Positive for appetite change.  Skin: Positive for rash.  All other systems reviewed and are negative.    Physical Exam Updated Vital Signs BP (!) 160/88 (BP  Location: Left Arm)   Pulse 64   Temp 98.2 F (36.8 C) (Oral)   Resp 16   Ht  (1.854 m)   Wt 200 lb 6.4 oz (90.9 kg)   SpO2 97%   BMI 26.44 kg/m   Physical Exam  Constitutional: He is oriented to person, place, and time.  Jaundiced   HENT:  Head: Normocephalic.  Mouth/Throat: Oropharynx is clear and moist.  Eyes:  + scleral icterus   Neck: Normal range of motion. Neck supple.  Cardiovascular: Normal rate, regular rhythm and normal heart sounds.   Pulmonary/Chest: Effort normal and breath sounds normal. No respiratory distress. He has no wheezes. He has no rales.  Abdominal: Soft. Bowel sounds are normal. He exhibits no distension. There is no tenderness. There is no guarding.  Musculoskeletal: Normal range of motion.  Neurological: He is alert and oriented to person, place, and time. No cranial nerve deficit. Coordination normal.  Skin: Skin is warm.  Psychiatric: He has a normal mood and affect.  + urticaria on arms. No vesicles or cellulitis   Nursing note and vitals reviewed.    ED Treatments / Results  Labs (all labs ordered are listed, but only abnormal results are displayed) Labs Reviewed  BASIC METABOLIC PANEL - Abnormal; Notable for the following:       Result Value   Chloride 97 (*)    All other components within normal limits  HEPATIC FUNCTION PANEL - Abnormal; Notable for the following:    AST 436 (*)    ALT 760 (*)    Alkaline Phosphatase 236 (*)    Total Bilirubin 6.7 (*)    Bilirubin, Direct 3.2 (*)    Indirect Bilirubin 3.5 (*)    All other components within normal limits  URINALYSIS, ROUTINE W REFLEX MICROSCOPIC - Abnormal; Notable for the following:    Color, Urine AMBER (*)    All other components within normal limits  ACETAMINOPHEN LEVEL - Abnormal; Notable for the following:    Acetaminophen (Tylenol), Serum <10 (*)    All other components within normal limits  CBC WITH DIFFERENTIAL/PLATELET  MONONUCLEOSIS SCREEN  ETHANOL  LIPASE,  BLOOD  HEPATITIS PANEL, ACUTE    EKG  EKG Interpretation None       Radiology Ct Abdomen Pelvis W Contrast  Result Date: 03/10/2017 CLINICAL DATA:  Dark urine high bilirubin EXAM: CT ABDOMEN AND PELVIS WITH CONTRAST TECHNIQUE: Multidetector CT imaging of the abdomen and pelvis was performed using the standard protocol following bolus administration of intravenous contrast. CONTRAST:  ISOVUE-300 IOPAMIDOL (ISOVUE-300) INJECTION 61% COMPARISON:  Ultrasound 03/07/2017, CT 07/06/2013 FINDINGS: Lower chest: Lung bases demonstrate no acute consolidation or pleural effusion. The heart is nonenlarged. Hepatobiliary: No focal hepatic abnormality. No calcified gallstones. No biliary dilatation. Pancreas: Unremarkable. No pancreatic ductal dilatation or surrounding inflammatory changes. Spleen: Normal  in size without focal abnormality. Adrenals/Urinary Tract: Subcentimeter hypodensity mid right kidney, too small to further characterize. Adrenals normal. No hydronephrosis. Bladder unremarkable Stomach/Bowel: Stomach is within normal limits. Appendix appears normal. No evidence of bowel wall thickening, distention, or inflammatory changes. Vascular/Lymphatic: No significant vascular findings are present. No enlarged abdominal or pelvic lymph nodes. Reproductive: Enlarged prostate with mass effect on the posterior bladder Other: No free air or free fluid. Fat containing right inguinal hernia. Musculoskeletal: No acute or suspicious bone lesion. IMPRESSION: 1. No CT evidence for acute intra-abdominal or pelvic pathology. No focal hepatic abnormality. No biliary dilatation. 2. Subcentimeter hypodensity right kidney, too small to further characterize 3. Enlarged prostate gland with mass effect on the bladder Electronically Signed   By: Jasmine Pang M.D.   On: 03/10/2017 23:40    Procedures Procedures (including critical care time)  Medications Ordered in ED Medications  iopamidol (ISOVUE-300) 61 %  injection (not administered)  diphenhydrAMINE (BENADRYL) injection 25 mg (not administered)  hydrocortisone sodium succinate (SOLU-CORTEF) 100 MG injection 200 mg (not administered)  sodium chloride 0.9 % bolus 1,000 mL (1,000 mLs Intravenous New Bag/Given 03/10/17 2154)  diphenhydrAMINE (BENADRYL) injection 25 mg (25 mg Intravenous Given 03/10/17 2154)  iopamidol (ISOVUE-300) 61 % injection 100 mL (100 mLs Intravenous Contrast Given 03/10/17 2317)     Initial Impression / Assessment and Plan / ED Course  I have reviewed the triage vital signs and the nursing notes.  Pertinent labs & imaging results that were available during my care of the patient were reviewed by me and considered in my medical decision making (see chart for details).     TAYJON HALLADAY is a 66 y.o. male here with [facility] LFTs, urticaria. Urticaria likely from T bili of 8. No mass on recent RUQ [place]. I called Dr. Bosie Clos, who recommend CT ab/pel and repeat labs. If CT neg, he will see patient as Research scientist (medical) tomorrow.    11:45 PM Repeat LFTs unchanged since earlier in the day. [name] to CT and had uncontrolled coughing spell, ? Allergic reaction. But has no throat closing and no worsening rash. Radiology tech recommend more benadryl and hydrocortisone as per protocol. CT showed no mass. Will admit for further workup for [facility] LFTs, possible ERCP with GI.   Final Clinical Impressions(s) / ED Diagnoses   Final diagnoses:  None    New Prescriptions New Prescriptions   No medications on file     Charlynne Pander, MD 03/10/17 2345

## 2017-03-10 NOTE — H&P (Signed)
History and Physical    Brandon [name]:[id number] DOB: August 22, 1951 DOA: 03/10/2017  Referring MD/NP/PA:   PCP: Mickie Hillier, MD   Patient coming from:  The patient is coming from home.  At baseline, pt is independent for most of ADL.  Chief Complaint: Dark urine, jaundice  HPI: Brandon [name] is a 66 y.o. male with medical history significant of hypertension, hyperlipidemia, hypothyroidism, left humeral pneumothorax, low testosterone level, who presents with dark urine or jaundice.  Patient states that she noted dark urine yellow skin and eyes for about 1 week. He went to the office and had a UA that showed some bilirubin in the urine. He has lab drawn that showed bilirubin 4.7, AP 225, AST 189, ALT 525. The acute hepatitis panel was negative. He also had RUQ [place] last week that was unremarkable. He states that over the last several days, he has more itchiness. He has been taking Benadryl round-the-clock with a minimal relief. He went back to see his primary care doctor and had labs drawn today that showed T bili 8.2, AP 281, AST 406, ALT 783. Patient states that he has nausea, no vomiting, diarrhea or abdominal pain. No fever or chills. He states that he drinks alcohol occasionally. He takes Tylenol some times, but never overdosed it. Pt states that he developed allergic reaction after he received contrast for CT scan, including sneezing, hives in both arms.  ED Course: pt was found to have abnormal liver functions with ALP 236, AST 436, ALT 760, total bilirubin 6.7, direct bilirubin 3.2, lipase is 33, Tylenol level less than 10, negative urinalysis, WBC 4.5, temperature normal, oxygen saturation normal. Pt is placed on med-surg bed for obs. GI, Dr. Levin Erp was consulted.  # CT abdomen/pelvis showed 1. No CT evidence for acute intra-abdominal or pelvic pathology. No focal hepatic abnormality. No biliary dilatation. 2. Subcentimeter hypodensity right kidney, too small to further  haracterize 3. Enlarged prostate gland with mass effect on the bladder  Review of Systems:   General: no fevers, chills, no changes in body weight, has fatigue HEENT: no blurry vision, hearing changes or sore throat. Has icterus. Respiratory: no dyspnea, coughing, wheezing CV: no chest pain, no palpitations GI: has nausea, no vomiting, abdominal pain, diarrhea, constipation GU: no dysuria, burning on urination, increased urinary frequency, hematuria  Ext: no leg edema Neuro: no unilateral weakness, numbness, or tingling, no vision change or hearing loss Skin: has hives in arms. No skin tear. MSK: No muscle spasm, no deformity, no limitation of range of movement in spin Heme: No easy bruising.  Travel history: No recent long distant travel.  Allergy:  Allergies  Allergen Reactions  . Ceftin [Cefuroxime Axetil] Diarrhea  . Oxycodone Nausea And Vomiting  . Iohexol     Patient started sneezing during the injection and continued sneezing after exam. ER doc notified and patient was given benadryl and cortisone for this. 03/10/17.    Past Medical History:  Diagnosis Date  . Chest pain 2007   none since  . Gallbladder disorder   . Hemopneumothorax on left   . Hiatal hernia   . Hypercholesteremia   . PONV (postoperative nausea and vomiting)    also has trouble waking up    Past Surgical History:  Procedure Laterality Date  . CARDIAC CATHETERIZATION    . COLONOSCOPY    . ORIF CLAVICULAR FRACTURE Left 04/18/2016   Procedure: OPEN REDUCTION INTERNAL FIXATION (ORIF) LEFT CLAVICLE FRACTURE;  Surgeon: Francena Hanly, MD;  Location: [facility]  OR;  Service: Orthopedics;  Laterality: Left;  . THYROIDECTOMY    . TONSILLECTOMY      Social History:  reports that he quit smoking about 37 years ago. He has never used smokeless tobacco. He reports that he drinks alcohol. He reports that he does not use drugs.  Family History:  Family History  Problem Relation Age of Onset  . Hypertension Mother     . Breast cancer Mother   . Heart disease Father   . Emphysema Father      Prior to Admission medications   Medication Sig Start Date End Date Taking? Authorizing Provider  acidophilus (RISAQUAD) CAPS capsule Take 1 capsule by mouth daily.   Yes Historical Provider, MD  cholecalciferol (VITAMIN D) 1000 units tablet Take 1,000 Units by mouth daily.   Yes Historical Provider, MD  Menaquinone-7 (VITAMIN K2) 100 MCG CAPS Take 100 mcg by mouth daily.   Yes Historical Provider, MD  testosterone cypionate (DEPOTESTOSTERONE CYPIONATE) 200 MG/ML injection Inject 120 mg into the muscle every Sunday.    Yes Historical Provider, MD  thyroid (ARMOUR) 65 MG tablet Take 97.5-130 mg by mouth 2 (two) times daily. Pt takes two tablets in the morning and one and one-half tablet in the afternoon.   Yes Historical Provider, MD  triamterene-hydrochlorothiazide (DYAZIDE) 37.5-25 MG capsule Take 1 capsule by mouth daily.   Yes Historical Provider, MD  vitamin B-12 (CYANOCOBALAMIN) 1000 MCG tablet Take 1,000 mcg by mouth daily.   Yes Historical Provider, MD    Physical Exam: Vitals:   03/10/17 2245 03/11/17 0000 03/11/17 0046 03/11/17 0622  BP: (!) 160/88 139/83 (!) 147/88 132/79  Pulse: 64 65 67 70  Resp: Temp:   97.6 F (36.4 C) 98.7 F (37.1 C)  TempSrc:   Oral Oral  SpO2: 97% 96% 94% 93%  Weight:   90 kg (198 lb 6.6 oz)   Height:    (1.854 m)    General: Not in acute distress HEENT:       Eyes: PERRL, EOMI, no scleral icterus.       ENT: No discharge from the ears and nose, no pharynx injection, no tonsillar enlargement.        Neck: No JVD, no bruit, no mass felt. Heme: No neck lymph node enlargement. Cardiac: S1/S2, RRR, No murmurs, No gallops or rubs. Respiratory: Good air movement bilaterally. No rales, wheezing, rhonchi or rubs. GI: Soft, nondistended, nontender, no rebound pain, no organomegaly, BS present. GU: No hematuria Ext: No pitting leg edema bilaterally. 2+DP/PT  pulse bilaterally. Musculoskeletal: No joint deformities, No joint redness or warmth, no limitation of ROM in spin. Skin: No rashes.  Neuro: Alert, oriented X3, cranial nerves II-XII grossly intact, moves all extremities normally. Muscle strength 5/5 in all extremities, sensation to light touch intact. Brachial reflex 2+ bilaterally. Knee reflex 1+ bilaterally. Negative Babinski's sign. Normal finger to nose test. Psych: Patient is not psychotic, no suicidal or hemocidal ideation.  Labs on Admission: I have personally reviewed following labs and imaging studies  CBC:  Recent Labs Lab 03/10/17 2101 03/11/17 0550  WBC 4.5 5.2  NEUTROABS 2.3  --   HGB 15.2 14.8  HCT 44.2 44.3  MCV 90.9 94.1  PLT 253 212   Basic Metabolic Panel:  Recent Labs Lab 03/10/17 2101 03/11/17 0550  NA 135 137  K 4.9 3.7  CL 97* 105  CO2 27 23  GLUCOSE 98 122*  BUN 19 17  CREATININE  0.76 0.79  CALCIUM 9.0 8.7*   GFR: Estimated Creatinine Clearance: 105.4 mL/min (by C-G formula based on SCr of 0.79 mg/dL). Liver Function Tests:  Recent Labs Lab 03/10/17 2101 03/11/17 0550  AST 436* 415*  ALT 760* 729*  ALKPHOS 236* 224*  BILITOT 6.7* 5.8*  PROT 7.0 7.0  ALBUMIN 3.5 3.4*    Recent Labs Lab 03/10/17 2129  LIPASE 33   No results for input(s): AMMONIA in the last 168 hours. Coagulation Profile:  Recent Labs Lab 03/11/17 0140  INR 0.90   Cardiac Enzymes: No results for input(s): CKTOTAL, CKMB, CKMBINDEX, TROPONINI in the last 168 hours. BNP (last 3 results) No results for input(s): PROBNP in the last 8760 hours. HbA1C: No results for input(s): HGBA1C in the last 72 hours. CBG: No results for input(s): GLUCAP in the last 168 hours. Lipid Profile: No results for input(s): CHOL, HDL, LDLCALC, TRIG, CHOLHDL, LDLDIRECT in the last 72 hours. Thyroid Function Tests: No results for input(s): TSH, T4TOTAL, FREET4, T3FREE, THYROIDAB in the last 72 hours. Anemia Panel: No results for  input(s): VITAMINB12, FOLATE, FERRITIN, TIBC, IRON, RETICCTPCT in the last 72 hours. Urine analysis:    Component Value Date/Time   COLORURINE AMBER (A) 03/10/2017 2241   APPEARANCEUR CLEAR 03/10/2017 2241   LABSPEC 1.014 03/10/2017 2241   PHURINE 7.0 03/10/2017 2241   GLUCOSEU NEGATIVE 03/10/2017 2241   HGBUR NEGATIVE 03/10/2017 2241   BILIRUBINUR NEGATIVE 03/10/2017 2241   KETONESUR NEGATIVE 03/10/2017 2241   PROTEINUR NEGATIVE 03/10/2017 2241   UROBILINOGEN 0.2 08/01/2012 0225   NITRITE NEGATIVE 03/10/2017 2241   LEUKOCYTESUR NEGATIVE 03/10/2017 2241   Sepsis Labs: (procalcitonin:4,lacticidven:4) )No results found for this or any previous visit (from the past 240 hour(s)).   Radiological Exams on Admission: Ct Abdomen Pelvis W Contrast  Result Date: 03/10/2017 CLINICAL DATA:  Dark urine high bilirubin EXAM: CT ABDOMEN AND PELVIS WITH CONTRAST TECHNIQUE: Multidetector CT imaging of the abdomen and pelvis was performed using the standard protocol following bolus administration of intravenous contrast. CONTRAST:  ISOVUE-300 IOPAMIDOL (ISOVUE-300) INJECTION 61% COMPARISON:  Ultrasound 03/07/2017, CT 07/06/2013 FINDINGS: Lower chest: Lung bases demonstrate no acute consolidation or pleural effusion. The heart is nonenlarged. Hepatobiliary: No focal hepatic abnormality. No calcified gallstones. No biliary dilatation. Pancreas: Unremarkable. No pancreatic ductal dilatation or surrounding inflammatory changes. Spleen: Normal in size without focal abnormality. Adrenals/Urinary Tract: Subcentimeter hypodensity mid right kidney, too small to further characterize. Adrenals normal. No hydronephrosis. Bladder unremarkable Stomach/Bowel: Stomach is within normal limits. Appendix appears normal. No evidence of bowel wall thickening, distention, or inflammatory changes. Vascular/Lymphatic: No significant vascular findings are present. No enlarged abdominal or pelvic lymph nodes.  Reproductive: Enlarged prostate with mass effect on the posterior bladder Other: No free air or free fluid. Fat containing right inguinal hernia. Musculoskeletal: No acute or suspicious bone lesion. IMPRESSION: 1. No CT evidence for acute intra-abdominal or pelvic pathology. No focal hepatic abnormality. No biliary dilatation. 2. Subcentimeter hypodensity right kidney, too small to further characterize 3. Enlarged prostate gland with mass effect on the bladder Electronically Signed   By: Jasmine Pang M.D.   On: 03/10/2017 23:40     EKG: Not done in ED, will get one.   Assessment/Plan Principal Problem:   Abnormal liver function Active Problems:   Hypothyroidism   Allergic reaction   Abnormal LFTs   Essential hypertension   Abnormal liver function: pt was found to have abnormal liver functions with ALP 236, AST 436, ALT 760, total bilirubin 6.7,  direct bilirubin 3.2. Etiology is not clear. Patient had negative hepatitis panel and right upper quadrant abdominal ultrasound as outpatient. Mononucleosis negative, alcohol level less than 5, lipase 33, Tylenol level less than 10. Pt is taking testosterone, which may have partially contributed. GI, Dr. Levin Erp was consulted by EDP. Pt may need ERCP to r/o biliary obstruction, but will defer to GI.  -will place on med-surg bed for obs -hold testosterone -Follow-up GI consultation -avoid tylenol -check LDH -IVF: 1L NS, then 125 cc/h   Hypothyroidism: Last TSH was 0.546 on 04/02/16 -Continue home Synthroid  Allergic reaction: --When necessary EpiPen for anaphylaxis -Benadryl, Pepcid -pt received 200 mg of Solu-Medrol in ED.  HTN: -continue Maxizide -IV hydralazine when necessary   DVT ppx: SCD Code Status: Full code Family Communication: None at bed side.  Disposition Plan:  Anticipate discharge back to previous home environment Consults called:  GI, Dr.Schoolar Admission status: medical floor/obs  Date of Service 03/11/2017     Lorretta Harp [facility] Pager [phone]  If 7PM-7AM, please contact night-coverage [email or web address] Password [facility] 03/11/2017, 7:34 AM

## 2017-03-11 DIAGNOSIS — E039 Hypothyroidism, unspecified: Secondary | ICD-10-CM | POA: Diagnosis not present

## 2017-03-11 DIAGNOSIS — K7689 Other specified diseases of liver: Secondary | ICD-10-CM | POA: Diagnosis not present

## 2017-03-11 DIAGNOSIS — I10 Essential (primary) hypertension: Secondary | ICD-10-CM | POA: Diagnosis present

## 2017-03-11 LAB — COMPREHENSIVE METABOLIC PANEL
ALT: 729 U/L — ABNORMAL HIGH (ref 17–63)
ANION GAP: 9 (ref 5–15)
AST: 415 U/L — AB (ref 15–41)
Albumin: 3.4 g/dL — ABNORMAL LOW (ref 3.5–5.0)
Alkaline Phosphatase: 224 U/L — ABNORMAL HIGH (ref 38–126)
BUN: 17 mg/dL (ref 6–20)
CHLORIDE: 105 mmol/L (ref 101–111)
CO2: 23 mmol/L (ref 22–32)
Calcium: 8.7 mg/dL — ABNORMAL LOW (ref 8.9–10.3)
Creatinine, Ser: 0.79 mg/dL (ref 0.61–1.24)
Glucose, Bld: 122 mg/dL — ABNORMAL HIGH (ref 65–99)
POTASSIUM: 3.7 mmol/L (ref 3.5–5.1)
Sodium: 137 mmol/L (ref 135–145)
Total Bilirubin: 5.8 mg/dL — ABNORMAL HIGH (ref 0.3–1.2)
Total Protein: 7 g/dL (ref 6.5–8.1)

## 2017-03-11 LAB — CBC
HEMATOCRIT: 44.3 % (ref 39.0–52.0)
HEMOGLOBIN: 14.8 g/dL (ref 13.0–17.0)
MCH: 31.4 pg (ref 26.0–34.0)
MCHC: 33.4 g/dL (ref 30.0–36.0)
MCV: 94.1 fL (ref 78.0–100.0)
PLATELETS: 212 10*3/uL (ref 150–400)
RBC: 4.71 MIL/uL (ref 4.22–5.81)
RDW: 14.9 % (ref 11.5–15.5)
WBC: 5.2 10*3/uL (ref 4.0–10.5)

## 2017-03-11 LAB — IRON AND TIBC
IRON: 89 ug/dL (ref 45–182)
Saturation Ratios: 23 % (ref 17.9–39.5)
TIBC: 388 ug/dL (ref 250–450)
UIBC: 299 ug/dL

## 2017-03-11 LAB — LACTATE DEHYDROGENASE: LDH: 242 U/L — AB (ref 98–192)

## 2017-03-11 LAB — FERRITIN: FERRITIN: 92 ng/mL (ref 24–336)

## 2017-03-11 LAB — APTT: APTT: 26 s (ref 24–36)

## 2017-03-11 LAB — PROTIME-INR
INR: 0.9
PROTHROMBIN TIME: 12.1 s (ref 11.4–15.2)

## 2017-03-11 LAB — HIV ANTIBODY (ROUTINE TESTING W REFLEX): HIV SCREEN 4TH GENERATION: NONREACTIVE

## 2017-03-11 MED ORDER — VITAMIN K2 100 MCG PO CAPS: 100.0000 ug | ORAL_CAPSULE | Freq: Every day | ORAL | Status: DC

## 2017-03-11 MED ORDER — TRIAMTERENE-HCTZ 37.5-25 MG PO TABS
1.0000 | ORAL_TABLET | Freq: Every day | ORAL | Status: DC
  Administered 2017-03-11: 1 via ORAL
  Filled 2017-03-11: qty 1

## 2017-03-11 MED ORDER — HYDROCORTISONE NA SUCCINATE PF 100 MG IJ SOLR
200.0000 mg | Freq: Once | INTRAMUSCULAR | Status: DC
  Filled 2017-03-11: qty 4

## 2017-03-11 MED ORDER — ZOLPIDEM TARTRATE 5 MG PO TABS: 5.0000 mg | ORAL_TABLET | Freq: Every evening | ORAL | Status: DC | PRN

## 2017-03-11 MED ORDER — DIPHENHYDRAMINE HCL 50 MG/ML IJ SOLN: 25.0000 mg | Freq: Three times a day (TID) | INTRAMUSCULAR | Status: DC | PRN

## 2017-03-11 MED ORDER — THYROID 120 MG PO TABS
120.0000 mg | ORAL_TABLET | Freq: Every day | ORAL | Status: DC
  Administered 2017-03-11: 120 mg via ORAL
  Filled 2017-03-11: qty 1

## 2017-03-11 MED ORDER — VITAMIN D3 25 MCG (1000 UNIT) PO TABS
1000.0000 [IU] | ORAL_TABLET | Freq: Every day | ORAL | Status: DC
  Filled 2017-03-11 (×2): qty 1

## 2017-03-11 MED ORDER — HYDRALAZINE HCL 20 MG/ML IJ SOLN
5.0000 mg | INTRAMUSCULAR | Status: DC | PRN
Start: 2017-03-11 — End: 2017-03-11

## 2017-03-11 MED ORDER — ONDANSETRON HCL 4 MG/2ML IJ SOLN: 4.0000 mg | Freq: Three times a day (TID) | INTRAMUSCULAR | Status: DC | PRN

## 2017-03-11 MED ORDER — THYROID 60 MG PO TABS
90.0000 mg | ORAL_TABLET | Freq: Every day | ORAL | Status: DC
  Filled 2017-03-11: qty 1

## 2017-03-11 MED ORDER — RISAQUAD PO CAPS
1.0000 | ORAL_CAPSULE | Freq: Every day | ORAL | Status: DC
  Filled 2017-03-11: qty 1

## 2017-03-11 MED ORDER — FAMOTIDINE 20 MG PO TABS
20.0000 mg | ORAL_TABLET | Freq: Two times a day (BID) | ORAL | Status: DC
  Administered 2017-03-11 (×2): 20 mg via ORAL
  Filled 2017-03-11 (×2): qty 1

## 2017-03-11 MED ORDER — SODIUM CHLORIDE 0.9 % IV SOLN
INTRAVENOUS | Status: DC
  Administered 2017-03-11 (×2): via INTRAVENOUS

## 2017-03-11 MED ORDER — TAMSULOSIN HCL 0.4 MG PO CAPS: 0.4000 mg | ORAL_CAPSULE | Freq: Every day | ORAL | 0 refills | Status: DC

## 2017-03-11 MED ORDER — EPINEPHRINE 0.3 MG/0.3ML IJ SOAJ
0.3000 mg | INTRAMUSCULAR | Status: DC | PRN
  Filled 2017-03-11: qty 0.3

## 2017-03-11 MED ORDER — THYROID 65 MG PO TABS
130.0000 mg | ORAL_TABLET | Freq: Every day | ORAL | Status: DC
  Filled 2017-03-11: qty 2

## 2017-03-11 MED ORDER — LABETALOL HCL 5 MG/ML IV SOLN
10.0000 mg | INTRAVENOUS | Status: DC | PRN
  Filled 2017-03-11: qty 4

## 2017-03-11 MED ORDER — VITAMIN B-12 1000 MCG PO TABS
1000.0000 ug | ORAL_TABLET | Freq: Every day | ORAL | Status: DC
  Filled 2017-03-11: qty 1

## 2017-03-11 NOTE — Progress Notes (Signed)
Patient discharged to home via family car, Patient states understands discharge instructions, and RX for Flomax and patient eduction given to patient  [facility] Cheralyn Oliver RN

## 2017-03-11 NOTE — Discharge Instructions (Signed)
Follow with Primary MD Mickie Hillier, MD in 7 days   Get CBC, CMP,  checked  by Primary MD next visit.    Activity: As tolerated with Full fall precautions use walker/cane & assistance as needed   Disposition Home    Diet: Heart Healthy    On your next visit with your primary care physician please Get Medicines reviewed and adjusted.   Please request your Prim.MD to go over all Hospital Tests and Procedure/Radiological results at the follow up, please get all Hospital records sent to your Prim MD by signing hospital release before you go home.   If you experience worsening of your admission symptoms, develop shortness of breath, life threatening emergency, suicidal or homicidal thoughts you must seek medical attention immediately by calling 911 or calling your MD immediately  if symptoms less severe.  You Must read complete instructions/literature along with all the possible adverse reactions/side effects for all the Medicines you take and that have been prescribed to you. Take any new Medicines after you have completely understood and accpet all the possible adverse reactions/side effects.   Do not drive, operating heavy machinery, perform activities at heights, swimming or participation in water activities or provide baby sitting services if your were admitted for syncope or siezures until you have seen by Primary MD or a Neurologist and advised to do so again.  Do not drive when taking Pain medications.    Do not take more than prescribed Pain, Sleep and Anxiety Medications  Special Instructions: If you have smoked or chewed Tobacco  in the last 2 yrs please stop smoking, stop any regular Alcohol  and or any Recreational drug use.  Wear Seat belts while driving.   Please note  You were cared for by a hospitalist during your hospital stay. If you have any questions about your discharge medications or the care you received while you were in the hospital after you are  discharged, you can call the unit and asked to speak with the hospitalist on call if the hospitalist that took care of you is not available. Once you are discharged, your primary care physician will handle any further medical issues. Please note that NO REFILLS for any discharge medications will be authorized once you are discharged, as it is imperative that you return to your primary care physician (or establish a relationship with a primary care physician if you do not have one) for your aftercare needs so that they can reassess your need for medications and monitor your lab values.

## 2017-03-11 NOTE — Consult Note (Signed)
Referring Provider: Dr. Randol Kern Primary Care Physician:  Mickie Hillier, MD Primary Gastroenterologist:  Dr. Bosie Clos  Reason for Consultation:  Elevated Liver Enzymes  HPI: Brandon [name] is a 66 y.o. male sent to hospital by Dr. Zachery Dauer after discussion with me yesterday due to elevated liver enzymes. Patient has elevated liver enzymes with TB 6.7, ALP 236, AST 436, ALT 760. Has been having severe itching with a rash that started last week. Has been on various herbal supplements for a while. Hep A, B, C negative as an outpt this month. CT negative for gallbladder inflammation and no focal liver abnormality. No biliary dilation. Pancreas normal. Occasional alcohol on the weekend. No known family history of liver disease. Wife at bedside.    Past Medical History:  Diagnosis Date  . Chest pain 2007   none since  . Gallbladder disorder   . Hemopneumothorax on left   . Hiatal hernia   . Hypercholesteremia   . PONV (postoperative nausea and vomiting)    also has trouble waking up    Past Surgical History:  Procedure Laterality Date  . CARDIAC CATHETERIZATION    . COLONOSCOPY    . ORIF CLAVICULAR FRACTURE Left 04/18/2016   Procedure: OPEN REDUCTION INTERNAL FIXATION (ORIF) LEFT CLAVICLE FRACTURE;  Surgeon: Francena Hanly, MD;  Location: [facility] OR;  Service: Orthopedics;  Laterality: Left;  . THYROIDECTOMY    . TONSILLECTOMY      Prior to Admission medications   Medication Sig Start Date End Date Taking? Authorizing Provider  acidophilus (RISAQUAD) CAPS capsule Take 1 capsule by mouth daily.   Yes Historical Provider, MD  cholecalciferol (VITAMIN D) 1000 units tablet Take 1,000 Units by mouth daily.   Yes Historical Provider, MD  Menaquinone-7 (VITAMIN K2) 100 MCG CAPS Take 100 mcg by mouth daily.   Yes Historical Provider, MD  testosterone cypionate (DEPOTESTOSTERONE CYPIONATE) 200 MG/ML injection Inject 120 mg into the muscle every Sunday.    Yes Historical Provider, MD  thyroid  (ARMOUR) 65 MG tablet Take 97.5-130 mg by mouth 2 (two) times daily. Pt takes two tablets in the morning and one and one-half tablet in the afternoon.   Yes Historical Provider, MD  triamterene-hydrochlorothiazide (DYAZIDE) 37.5-25 MG capsule Take 1 capsule by mouth daily.   Yes Historical Provider, MD  vitamin B-12 (CYANOCOBALAMIN) 1000 MCG tablet Take 1,000 mcg by mouth daily.   Yes Historical Provider, MD    Scheduled Meds: . acidophilus  1 capsule Oral Daily  . cholecalciferol  1,000 Units Oral Daily  . famotidine  20 mg Oral BID  . thyroid  120 mg Oral QAC breakfast  . thyroid  90 mg Oral QPC lunch  . triamterene-hydrochlorothiazide  1 tablet Oral Daily  . vitamin B-12  1,000 mcg Oral Daily  . Vitamin K2  100 mcg Oral Daily   Continuous Infusions: . sodium chloride 125 mL/hr at 03/11/17 0901   PRN Meds:.diphenhydrAMINE, EPINEPHrine, labetalol, ondansetron, zolpidem  Allergies as of 03/10/2017 - Review Complete 03/10/2017  Allergen Reaction Noted  . Ceftin [cefuroxime axetil] Diarrhea 03/10/2017  . Oxycodone Nausea And Vomiting 04/16/2016  . Iohexol  03/10/2017    Family History  Problem Relation Age of Onset  . Hypertension Mother   . Breast cancer Mother   . Heart disease Father   . Emphysema Father     Social History   Social History  . Marital status: Married    Spouse name: N/A  . Number of children: N/A  . Years of education:  N/A   Occupational History  . Not on file.   Social History Main Topics  . Smoking status: Former Smoker    Quit date: 04/18/1979  . Smokeless tobacco: Never Used  . Alcohol use Yes     Comment: occasional  . Drug use: No  . Sexual activity: Not on file   Other Topics Concern  . Not on file   Social History Narrative  . No narrative on file    Review of Systems: All negative except as stated above in HPI.  Physical Exam: Vital signs: Vitals:   03/11/17 0046 03/11/17 0622  BP: (!) 147/88 132/79  Pulse: 67 70  Resp: 18  18  Temp: 97.6 F (36.4 C) 98.7 F (37.1 C)     General:   Lethargic, Well-developed, well-nourished, pleasant and cooperative in NAD HEENT: +scleral icterus, oropharynx clear Lungs:  Clear throughout to auscultation.   No wheezes, crackles, or rhonchi. No acute distress. Heart:  Regular rate and rhythm; no murmurs, clicks, rubs,  or gallops. Abdomen: soft, nontender, nondistended, +BS  Rectal:  Deferred Skin: jaundice  GI:  Lab Results:  Recent Labs  03/10/17 2101 03/11/17 0550  WBC 4.5 5.2  HGB 15.2 14.8  HCT 44.2 44.3  PLT 253 212   BMET  Recent Labs  03/10/17 2101 03/11/17 0550  NA 135 137  K 4.9 3.7  CL 97* 105  CO2 27 23  GLUCOSE 98 122*  BUN 19 17  CREATININE 0.76 0.79  CALCIUM 9.0 8.7*   LFT  Recent Labs  03/10/17 2101 03/11/17 0550  PROT 7.0 7.0  ALBUMIN 3.5 3.4*  AST 436* 415*  ALT 760* 729*  ALKPHOS 236* 224*  BILITOT 6.7* 5.8*  BILIDIR 3.2*  --   IBILI 3.5*  --    PT/INR  Recent Labs  03/11/17 0140  LABPROT 12.1  INR 0.90     Studies/Results: Ct Abdomen Pelvis W Contrast  Result Date: 03/10/2017 CLINICAL DATA:  Dark urine high bilirubin EXAM: CT ABDOMEN AND PELVIS WITH CONTRAST TECHNIQUE: Multidetector CT imaging of the abdomen and pelvis was performed using the standard protocol following bolus administration of intravenous contrast. CONTRAST:  ISOVUE-300 IOPAMIDOL (ISOVUE-300) INJECTION 61% COMPARISON:  Ultrasound 03/07/2017, CT 07/06/2013 FINDINGS: Lower chest: Lung bases demonstrate no acute consolidation or pleural effusion. The heart is nonenlarged. Hepatobiliary: No focal hepatic abnormality. No calcified gallstones. No biliary dilatation. Pancreas: Unremarkable. No pancreatic ductal dilatation or surrounding inflammatory changes. Spleen: Normal in size without focal abnormality. Adrenals/Urinary Tract: Subcentimeter hypodensity mid right kidney, too small to further characterize. Adrenals normal. No hydronephrosis. Bladder  unremarkable Stomach/Bowel: Stomach is within normal limits. Appendix appears normal. No evidence of bowel wall thickening, distention, or inflammatory changes. Vascular/Lymphatic: No significant vascular findings are present. No enlarged abdominal or pelvic lymph nodes. Reproductive: Enlarged prostate with mass effect on the posterior bladder Other: No free air or free fluid. Fat containing right inguinal hernia. Musculoskeletal: No acute or suspicious bone lesion. IMPRESSION: 1. No CT evidence for acute intra-abdominal or pelvic pathology. No focal hepatic abnormality. No biliary dilatation. 2. Subcentimeter hypodensity right kidney, too small to further characterize 3. Enlarged prostate gland with mass effect on the bladder Electronically Signed   By: Jasmine Pang M.D.   On: 03/10/2017 23:40    Impression/Plan: 66 yo with elevated liver enzymes with differential including meds vs autoimmune hepatitis vs hemochromatosis vs Wilson's disease (less likely). PBC and PSC also in differential. I am most concerned with one of his  herbal supplements causing it vs autoimmune hepatitis. Advised to stop all herbal therapies and alcohol. Autoimmune markers, ferritin, and iron panel ordered. Ceruloplasmin ordered. If able to tolerate low fat diet, then ok to go home today and will need f/u LFTs on Friday. May need to be started on Prednisone PO if LFTs remain elevated and markers unrevealing. May need a liver biopsy as well in the near future. D/W Dr. Randol Kern.    LOS: 0 days   Demia Viera C.  03/11/2017, 12:56 PM  Pager [phone]  AFTER 5 pm or on weekends please call [phone]

## 2017-03-11 NOTE — Discharge Summary (Signed)
ZEPH RIEBEL, is a 66 y.o. male  DOB [birth year]  MRN [id number].  Admission date:  03/10/2017  Admitting Physician  Lorretta Harp, MD  Discharge Date:  03/11/2017   Primary MD  Mickie Hillier, MD  Recommendations for primary care physician for things to follow:  - Patient will follow with GI as an outpatient regarding results of autoimmune/hemochromatosis workup. - History CBC, CMP during next visit   Admission Diagnosis  Hyperbilirubinemia [E80.6] Elevated LFTs [R79.89] Abnormal LFTs [R94.5]   Discharge Diagnosis  Hyperbilirubinemia [E80.6] Elevated LFTs [R79.89] Abnormal LFTs [R94.5]    Principal Problem:   Abnormal liver function Active Problems:   Hypothyroidism   Allergic reaction   Abnormal LFTs   Essential hypertension      Past Medical History:  Diagnosis Date  . Chest pain 2007   none since  . Gallbladder disorder   . Hemopneumothorax on left   . Hiatal hernia   . Hypercholesteremia   . PONV (postoperative nausea and vomiting)    also has trouble waking up    Past Surgical History:  Procedure Laterality Date  . CARDIAC CATHETERIZATION    . COLONOSCOPY    . ORIF CLAVICULAR FRACTURE Left 04/18/2016   Procedure: OPEN REDUCTION INTERNAL FIXATION (ORIF) LEFT CLAVICLE FRACTURE;  Surgeon: Francena Hanly, MD;  Location: [facility] OR;  Service: Orthopedics;  Laterality: Left;  . THYROIDECTOMY    . TONSILLECTOMY         History of present illness and  Hospital Course:     Kindly see H&P for history of present illness and admission details, please review complete Labs, Consult reports and Test reports for all details in brief  HPI  from the history and physical done on the day of admission 03/11/2017  HPI: JAYMES HANG is a 66 y.o. male with medical history significant of hypertension, hyperlipidemia, hypothyroidism, left humeral pneumothorax, low testosterone level, who presents  with dark urine or jaundice.  Patient states that she noted dark urine yellow skin and eyes for about 1 week. He went to the office and had a UA that showed some bilirubin in the urine. He has lab drawn that showed bilirubin 4.7, AP 225, AST 189, ALT 525. The acute hepatitis panel was negative. He also had RUQ [place] last week that was unremarkable. He states that over the last several days, he has more itchiness. He has been taking Benadryl round-the-clock with a minimal relief. He went back to see his primary care doctor and had labs drawn today that showed T bili 8.2, AP 281, AST 406, ALT 783. Patient states that he has nausea, no vomiting, diarrhea or abdominal pain. No fever or chills. He states that he drinks alcohol occasionally. He takes Tylenol some times, but never overdosed it. Pt states that he developed allergic reaction after he received contrast for CT scan, including sneezing, hives in both arms.  ED Course: pt was found to have abnormal liver functions with ALP 236, AST 436, ALT 760, total bilirubin 6.7, direct bilirubin 3.2,  lipase is 33, Tylenol level less than 10, negative urinalysis, WBC 4.5, temperature normal, oxygen saturation normal. Pt is placed on med-surg bed for obs. GI, Dr. Levin Erp was consulted.  # CT abdomen/pelvis showed 1. No CT evidence for acute intra-abdominal or pelvic pathology. No focal hepatic abnormality. No biliary dilatation. 2. Subcentimeter hypodensity right kidney, too small to further haracterize 3. Enlarged prostate gland with mass effect on the bladder   Hospital Course   Abnormal liver function - Unclear etiology, definite at this panel as an outpatient, right upper quadrant ultrasound with no acute finding in outpatient setting, CT abdomen and pelvis with no acute finding as well, negative hepatitis panel as an outpatient. - GI input greatly appreciated, autoimmune and hemochromatosis workup will be sent. Discharge, and patient to follow results as  an outpatient with GI, and they will determine if steroid is indicated in outpatient setting as discussed with Dr. Bosie Clos. - Patient instructed to stop all his supplements from alternative medicine physician, as was instructed to stop his testosterone, as elevated LFT may be related to a defect of medication.  Hypothyroidisim - cont with thyroid.  HTN - cont with home meds  BPH - will start on flomax  Discharge Condition:  stable   Follow UP  Follow-up Information    Mickie Hillier, MD Follow up in 1 week(s).   Specialty:  Family Medicine Contact information: [address] [place] [address] [phone]        Shirley Friar., MD Follow up in 1 week(s).   Specialty:  Gastroenterology Contact information: [address] [place] [address] [phone]             Discharge Instructions  and  Discharge Medications    Discharge Instructions    Discharge instructions    Complete by:  As directed    Follow with Primary MD Mickie Hillier, MD in 7 days   Get CBC, CMP,  checked  by Primary MD next visit.    Activity: As tolerated with Full fall precautions use walker/cane & assistance as needed   Disposition Home    Diet: Heart Healthy    On your next visit with your primary care physician please Get Medicines reviewed and adjusted.   Please request your Prim.MD to go over all Hospital Tests and Procedure/Radiological results at the follow up, please get all Hospital records sent to your Prim MD by signing hospital release before you go home.   If you experience worsening of your admission symptoms, develop shortness of breath, life threatening emergency, suicidal or homicidal thoughts you must seek medical attention immediately by calling 911 or calling your MD immediately  if symptoms less severe.  You Must read complete instructions/literature along with all the possible adverse reactions/side effects for all the  Medicines you take and that have been prescribed to you. Take any new Medicines after you have completely understood and accpet all the possible adverse reactions/side effects.   Do not drive, operating heavy machinery, perform activities at heights, swimming or participation in water activities or provide baby sitting services if your were admitted for syncope or siezures until you have seen by Primary MD or a Neurologist and advised to do so again.  Do not drive when taking Pain medications.    Do not take more than prescribed Pain, Sleep and Anxiety Medications  Special Instructions: If you have smoked or chewed Tobacco  in the last 2 yrs please stop smoking, stop any regular Alcohol  and or any Recreational drug use.  Wear Seat belts while driving.   Please note  You were cared for by a hospitalist during your hospital stay. If you have any questions about your discharge medications or the care you received while you were in the hospital after you are discharged, you can call the unit and asked to speak with the hospitalist on call if the hospitalist that took care of you is not available. Once you are discharged, your primary care physician will handle any further medical issues. Please note that NO REFILLS for any discharge medications will be authorized once you are discharged, as it is imperative that you return to your primary care physician (or establish a relationship with a primary care physician if you do not have one) for your aftercare needs so that they can reassess your need for medications and monitor your lab values.   Increase activity slowly    Complete by:  As directed      Allergies as of 03/11/2017      Reactions   Ceftin [cefuroxime Axetil] Diarrhea   Oxycodone Nausea And Vomiting   Iohexol    Patient started sneezing during the injection and continued sneezing after exam. ER doc notified and patient was given benadryl and cortisone for this. 03/10/17.        Medication List    STOP taking these medications   testosterone cypionate 200 MG/ML injection Commonly known as:  DEPOTESTOSTERONE CYPIONATE   vitamin B-12 1000 MCG tablet Commonly known as:  CYANOCOBALAMIN   Vitamin K2 100 MCG Caps     TAKE these medications   acidophilus Caps capsule Take 1 capsule by mouth daily.   cholecalciferol 1000 units tablet Commonly known as:  VITAMIN D Take 1,000 Units by mouth daily.   tamsulosin 0.4 MG Caps capsule Commonly known as:  FLOMAX Take 1 capsule (0.4 mg total) by mouth daily after supper.   thyroid 65 MG tablet Commonly known as:  ARMOUR Take 97.5-130 mg by mouth 2 (two) times daily. Pt takes two tablets in the morning and one and one-half tablet in the afternoon.   triamterene-hydrochlorothiazide 37.5-25 MG capsule Commonly known as:  DYAZIDE Take 1 capsule by mouth daily.         Diet and Activity recommendation: See Discharge Instructions above   Consults obtained -  GI , dr schooler   Major procedures and Radiology Reports - PLEASE review detailed and final reports for all details, in brief -     Ct Abdomen Pelvis W Contrast  Result Date: 03/10/2017 CLINICAL DATA:  Dark urine high bilirubin EXAM: CT ABDOMEN AND PELVIS WITH CONTRAST TECHNIQUE: Multidetector CT imaging of the abdomen and pelvis was performed using the standard protocol following bolus administration of intravenous contrast. CONTRAST:  ISOVUE-300 IOPAMIDOL (ISOVUE-300) INJECTION 61% COMPARISON:  Ultrasound 03/07/2017, CT 07/06/2013 FINDINGS: Lower chest: Lung bases demonstrate no acute consolidation or pleural effusion. The heart is nonenlarged. Hepatobiliary: No focal hepatic abnormality. No calcified gallstones. No biliary dilatation. Pancreas: Unremarkable. No pancreatic ductal dilatation or surrounding inflammatory changes. Spleen: Normal in size without focal abnormality. Adrenals/Urinary Tract: Subcentimeter hypodensity mid right kidney, too  small to further characterize. Adrenals normal. No hydronephrosis. Bladder unremarkable Stomach/Bowel: Stomach is within normal limits. Appendix appears normal. No evidence of bowel wall thickening, distention, or inflammatory changes. Vascular/Lymphatic: No significant vascular findings are present. No enlarged abdominal or pelvic lymph nodes. Reproductive: Enlarged prostate with mass effect on the posterior bladder Other: No free air or  free fluid. Fat containing right inguinal hernia. Musculoskeletal: No acute or suspicious bone lesion. IMPRESSION: 1. No CT evidence for acute intra-abdominal or pelvic pathology. No focal hepatic abnormality. No biliary dilatation. 2. Subcentimeter hypodensity right kidney, too small to further characterize 3. Enlarged prostate gland with mass effect on the bladder Electronically Signed   By: Jasmine Pang M.D.   On: 03/10/2017 23:40   US Abdomen Limited Ruq  Result Date: 03/07/2017 CLINICAL DATA:  Elevated LFTs EXAM: US ABDOMEN LIMITED - RIGHT UPPER QUADRANT COMPARISON:  None. FINDINGS: Gallbladder: No gallstones or wall thickening visualized. No sonographic Murphy sign noted by sonographer. Common bile duct: Diameter: 6.4 mm. Liver: No focal lesion identified. Within normal limits in parenchymal echogenicity. IMPRESSION: No acute abnormality noted. Electronically Signed   By: Alcide Clever M.D.   On: 03/07/2017 12:44    Micro Results  No results found for this or any previous visit (from the past 240 hour(s)).     Today   Subjective:   Breion Novacek today has no headache,no chest or abdominal pain,no new weakness tingling or numbness.  Objective:   Blood pressure 132/79, pulse 70, temperature 98.7 F (37.1 C), temperature source Oral, resp. rate 18, height  (1.854 m), weight 90 kg (198 lb 6.6 oz), SpO2 93 %.   Intake/Output Summary (Last 24 hours) at 03/11/17 1320 Last data filed at 03/11/17 0610  Gross per 24 hour  Intake          1652.08 ml    Output              750 ml  Net           902.08 ml    Exam Awake Alert, Oriented x 3. Supple Neck,No JVD,  Symmetrical Chest wall movement, Good air movement bilaterally, CTAB RRR,No Gallops,Rubs or new Murmurs, No Parasternal Heave +ve B.Sounds, Abd Soft, Non tender,  No rebound -guarding or rigidity. No Cyanosis, Clubbing or edema, No new Rash or bruise  Data Review   CBC w Diff:  Lab Results  Component Value Date   WBC 5.2 03/11/2017   HGB 14.8 03/11/2017   HCT 44.3 03/11/2017   PLT 212 03/11/2017   LYMPHOPCT 29 03/10/2017   MONOPCT 11 03/10/2017   EOSPCT 8 03/10/2017   BASOPCT 0 03/10/2017    CMP:  Lab Results  Component Value Date   NA 137 03/11/2017   K 3.7 03/11/2017   CL 105 03/11/2017   CO2 23 03/11/2017   BUN 17 03/11/2017   CREATININE 0.79 03/11/2017   PROT 7.0 03/11/2017   ALBUMIN 3.4 (L) 03/11/2017   BILITOT 5.8 (H) 03/11/2017   ALKPHOS 224 (H) 03/11/2017   AST 415 (H) 03/11/2017   ALT 729 (H) 03/11/2017  .   Total Time in preparing paper work, data evaluation and todays exam - 35 minutes  Carmeline Kowal M.D on 03/11/2017 at 1:20 PM  [facility]   Office  [phone]

## 2017-03-12 LAB — MITOCHONDRIAL ANTIBODIES: Mitochondrial M2 Ab, IgG: 4.6 Units (ref 0.0–20.0)

## 2017-03-12 LAB — ANTINUCLEAR ANTIBODIES, IFA: ANA Ab, IFA: NEGATIVE

## 2017-03-12 LAB — HEPATITIS PANEL, ACUTE
HEP A IGM: NEGATIVE
Hep B C IgM: NEGATIVE
Hepatitis B Surface Ag: NEGATIVE

## 2017-03-12 LAB — ANTI-SMOOTH MUSCLE ANTIBODY, IGG: F-Actin IgG: 7 Units (ref 0–19)

## 2017-03-12 LAB — CERULOPLASMIN: Ceruloplasmin: 33.2 mg/dL — ABNORMAL HIGH (ref 16.0–31.0)

## 2017-07-23 ENCOUNTER — Telehealth: Payer: Self-pay | Admitting: *Deleted

## 2017-07-23 NOTE — Telephone Encounter (Signed)
Referral from Iva BoopKevin Via, MD of [facility] sent to scheduling

## 2017-09-09 NOTE — Progress Notes (Deleted)
Cardiology Office Note   Date:  09/09/2017   ID:  Brandon [name], DOB Sep 14, 1951, MRN [id number]  PCP:  [name], MD  Cardiologist:   Charlton Haws, MD   No chief complaint on file.     History of Present Illness: Brandon [name] is a 66 y.o. male who presents for consultation regarding chest pain. Referred by Dr [name] Last seen by me 2013 had seen Dr Deborah Chalk in 2007 cath with 30-40% RCA and LAD disease Had normal stress echo in 2013. Hospitalized April 2018 with jaundice and elevated LFT;s. Bilirubin 4.7 AST 189 ALT 525. Acute hepatitis panel negative. Pruritis Rx with Benedryl. RUQ [place] negative. LFTls rose to Bili 8.2 AST 406 and ALT 783. Seen by Dr Levin Erp GI. CT abdomen no acute pathology Told to stop supplements from atlernative medicine physician and testosterone. Autoimmune labs and w/u hemochromatosis as outpatient  Mitochondrial Ab, ANA ferritin , iron and ceruloplasmin normal     Past Medical History:  Diagnosis Date  . Atrial fibrillation (HCC)   . Cervical spine disease   . Chest pain 2007   none since  . Gallbladder disorder   . Hemopneumothorax on left   . Hiatal hernia   . Hypercholesteremia   . Hyperlipidemia   . Meniere disease   . PONV (postoperative nausea and vomiting)    also has trouble waking up    Past Surgical History:  Procedure Laterality Date  . CARDIAC CATHETERIZATION    . COLONOSCOPY    . ORIF CLAVICULAR FRACTURE Left 04/18/2016   Procedure: OPEN REDUCTION INTERNAL FIXATION (ORIF) LEFT CLAVICLE FRACTURE;  Surgeon: Francena Hanly, MD;  Location: [facility] OR;  Service: Orthopedics;  Laterality: Left;  . THYROIDECTOMY    . TONSILLECTOMY       Current Outpatient Prescriptions  Medication Sig Dispense Refill  . acidophilus (RISAQUAD) CAPS capsule Take 1 capsule by mouth daily.    . cholecalciferol (VITAMIN D) 1000 units tablet Take 1,000 Units by mouth daily.    . tamsulosin (FLOMAX) 0.4 MG CAPS capsule Take 1 capsule (0.4 mg total)  by mouth daily after supper. 30 capsule 0  . thyroid (ARMOUR) 65 MG tablet Take 97.5-130 mg by mouth 2 (two) times daily. Pt takes two tablets in the morning and one and one-half tablet in the afternoon.    . triamterene-hydrochlorothiazide (DYAZIDE) 37.5-25 MG capsule Take 1 capsule by mouth daily.     No current facility-administered medications for this visit.     Allergies:   Ceftin [cefuroxime axetil]; Oxycodone; and Iohexol    Social History:  The patient  reports that he quit smoking about 38 years ago. He has never used smokeless tobacco. He reports that he drinks alcohol. He reports that he does not use drugs.   Family History:  The patient's family history includes Breast cancer in his mother; Emphysema in his father; Heart disease in his father; Hypertension in his mother.    ROS:  Please see the history of present illness.   Otherwise, review of systems are positive for none.   All other systems are reviewed and negative.    PHYSICAL EXAM: VS:  There were no vitals taken for this visit. , BMI There is no height or weight on file to calculate BMI. Affect appropriate Healthy:  appears stated age HEENT: normal Neck supple with no adenopathy JVP normal no bruits no thyromegaly Lungs clear with no wheezing and good diaphragmatic motion Heart:  S1/S2 no murmur, no rub,  gallop or click PMI normal Abdomen: benighn, BS positve, no tenderness, no AAA no bruit.  No HSM or HJR Distal pulses intact with no bruits No edema Neuro non-focal Skin warm and dry No muscular weakness    EKG:  03/11/17 NSR normal ECG    Recent Labs: 03/11/2017: ALT 729; BUN 17; Creatinine, Ser 0.79; Hemoglobin 14.8; Platelets 212; Potassium 3.7; Sodium 137    Lipid Panel No results found for: CHOL, TRIG, HDL, CHOLHDL, VLDL, LDLCALC, LDLDIRECT    Wt Readings from Last 3 Encounters:  03/11/17 198 lb 6.6 oz (90 kg)  04/18/16 201 lb 3.2 oz (91.3 kg)  03/30/16 201 lb 3.2 oz (91.3 kg)      Other  studies Reviewed: Additional studies/ records that were reviewed today include: cardiology notes 2013 stress echo cath Dr Deborah Chalk 2007 labs and hospitalist notes 02/2017 regarding elevated LFTls.    ASSESSMENT AND PLAN:  1.  Chest Pain 2. HTN 3. LFTls 4. Thyroid 5. Prostate    Current medicines are reviewed at length with the patient today.  The patient does not have concerns regarding medicines.  The following changes have been made:  no change  Labs/ tests ordered today include: *** No orders of the defined types were placed in this encounter.    Disposition:   FU with cardiology in a year      Signed, Charlton Haws, MD  09/09/2017 5:17 PM    [facility] [address] [place], [place], [place]  [address] Phone: [phone]; Fax: [phone]

## 2017-09-17 ENCOUNTER — Ambulatory Visit: Payer: 59 | Admitting: Cardiovascular Disease

## 2018-03-26 ENCOUNTER — Encounter: Payer: Self-pay | Admitting: Neurology

## 2018-04-02 NOTE — Progress Notes (Deleted)
Brandon [name] was seen today in the movement disorders clinic for neurologic consultation at the request of Via, Caryn Bee, MD.  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  The records that were made available to me were reviewed.  Tremor: {yes no:314532}   How long has it been going on? ***  At rest or with activation?  ***  When is it noted the most?  ***  Fam hx of tremor?  {yes ZO:109604}  Located where?  ***  Affected by caffeine:  {yes no:314532}  Affected by alcohol:  {yes no:314532}  Affected by stress:  {yes no:314532}  Affected by fatigue:  {yes no:314532}  Spills soup if on spoon:  {yes no:314532}  Spills glass of liquid if full:  {yes no:314532}  Affects ADL's (tying shoes, brushing teeth, etc):  {yes no:314532}  Tremor inducing meds:  {yes no:314532}  Other Specific Symptoms:  Voice: *** Sleep: ***  Vivid Dreams:  {yes no:314532}  Acting out dreams:  {yes no:314532} Wet Pillows: {yes no:314532} Postural symptoms:  {yes no:314532}  Falls?  {yes no:314532} Bradykinesia symptoms: {parkinson brady:18041} Loss of smell:  {yes no:314532} Loss of taste:  {yes no:314532} Urinary Incontinence:  {yes no:314532} Difficulty Swallowing:  {yes no:314532} Handwriting, micrographia: {yes no:314532} Trouble with ADL's:  {yes no:314532}  Trouble buttoning clothing: {yes no:314532} Depression:  {yes no:314532} Memory changes:  {yes no:314532} Hallucinations:  {yes no:314532}  visual distortions: {yes no:314532} N/V:  {yes no:314532} Lightheaded:  {yes no:314532}  Syncope: {yes no:314532} Diplopia:  {yes no:314532} Dyskinesia:  {yes no:314532}  Neuroimaging of the brain has *** previously been performed.  It *** available for my review today.  Last MRI brain in 2015 due to hearing loss/tinnitus.  I personally reviewed this.  It was essentially unremarkable.  PREVIOUS MEDICATIONS: {Parkinson's RX:18200}  ALLERGIES:   Allergies  Allergen Reactions    . Ceftin [Cefuroxime Axetil] Diarrhea  . Oxycodone Nausea And Vomiting  . Iohexol     Patient started sneezing during the injection and continued sneezing after exam. ER doc notified and patient was given benadryl and cortisone for this. 03/10/17.    CURRENT MEDICATIONS:  Outpatient Encounter Medications as of 04/06/2018  Medication Sig  . acidophilus (RISAQUAD) CAPS capsule Take 1 capsule by mouth daily.  . cholecalciferol (VITAMIN D) 1000 units tablet Take 1,000 Units by mouth daily.  . tamsulosin (FLOMAX) 0.4 MG CAPS capsule Take 1 capsule (0.4 mg total) by mouth daily after supper.  . thyroid (ARMOUR) 65 MG tablet Take 97.5-130 mg by mouth 2 (two) times daily. Pt takes two tablets in the morning and one and one-half tablet in the afternoon.  . triamterene-hydrochlorothiazide (DYAZIDE) 37.5-25 MG capsule Take 1 capsule by mouth daily.   No facility-administered encounter medications on file as of 04/06/2018.     PAST MEDICAL HISTORY:   Past Medical History:  Diagnosis Date  . Atrial fibrillation (HCC)   . Cervical spine disease   . Chest pain 2007   none since  . Gallbladder disorder   . Hemopneumothorax on left   . Hiatal hernia   . Hypercholesteremia   . Hyperlipidemia   . Meniere disease   . PONV (postoperative nausea and vomiting)    also has trouble waking up    PAST SURGICAL HISTORY:   Past Surgical History:  Procedure Laterality Date  . CARDIAC CATHETERIZATION    . COLONOSCOPY    . ORIF CLAVICULAR FRACTURE Left 04/18/2016   Procedure: OPEN REDUCTION INTERNAL  FIXATION (ORIF) LEFT CLAVICLE FRACTURE;  Surgeon: Francena Hanly, MD;  Location: [facility] OR;  Service: Orthopedics;  Laterality: Left;  . THYROIDECTOMY    . TONSILLECTOMY      SOCIAL HISTORY:   Social History   Socioeconomic History  . Marital status: Married    Spouse name: Not on file  . Number of children: Not on file  . Years of education: Not on file  . Highest education level: Not on file   Occupational History  . Not on file  Social Needs  . Financial resource strain: Not on file  . Food insecurity:    Worry: Not on file    Inability: Not on file  . Transportation needs:    Medical: Not on file    Non-medical: Not on file  Tobacco Use  . Smoking status: Former Smoker    Last attempt to quit: 04/18/1979    Years since quitting: 38.9  . Smokeless tobacco: Never Used  Substance and Sexual Activity  . Alcohol use: Yes    Comment: occasional  . Drug use: No  . Sexual activity: Not on file  Lifestyle  . Physical activity:    Days per week: Not on file    Minutes per session: Not on file  . Stress: Not on file  Relationships  . Social connections:    Talks on phone: Not on file    Gets together: Not on file    Attends religious service: Not on file    Active member of club or organization: Not on file    Attends meetings of clubs or organizations: Not on file    Relationship status: Not on file  . Intimate partner violence:    Fear of current or ex partner: Not on file    Emotionally abused: Not on file    Physically abused: Not on file    Forced sexual activity: Not on file  Other Topics Concern  . Not on file  Social History Narrative  . Not on file    FAMILY HISTORY:   Family Status  Relation Name Status  . Mother  Deceased  . Father  Deceased    ROS:  A complete 10 system review of systems was obtained and was unremarkable apart from what is mentioned above.  PHYSICAL EXAMINATION:    VITALS:  There were no vitals filed for this visit.  GEN:  The patient appears stated age and is in NAD. HEENT:  Normocephalic, atraumatic.  The mucous membranes are moist. The superficial temporal arteries are without ropiness or tenderness. CV:  RRR Lungs:  CTAB Neck/HEME:  There are no carotid bruits bilaterally.  Neurological examination:  Orientation: The patient is alert and oriented x3. Fund of knowledge is appropriate.  Recent and remote memory are  intact.  Attention and concentration are normal.    Able to name objects and repeat phrases. Cranial nerves: There is good facial symmetry. Pupils are equal round and reactive to light bilaterally. Fundoscopic exam reveals clear margins bilaterally. Extraocular muscles are intact. The visual fields are full to confrontational testing. The speech is fluent and clear. Soft palate rises symmetrically and there is no tongue deviation. Hearing is intact to conversational tone. Sensation: Sensation is intact to light and pinprick throughout (facial, trunk, extremities). Vibration is intact at the bilateral big toe. There is no extinction with double simultaneous stimulation. There is no sensory dermatomal level identified. Motor: Strength is 5/5 in the bilateral upper and lower extremities.   Shoulder  shrug is equal and symmetric.  There is no pronator drift. Deep tendon reflexes: Deep tendon reflexes are 2/4 at the bilateral biceps, triceps, brachioradialis, patella and achilles. Plantar responses are downgoing bilaterally.  Movement examination: Tone: There is ***tone in the bilateral upper extremities.  The tone in the lower extremities is ***.  Abnormal movements: *** Coordination:  There is *** decremation with RAM's, *** Gait and Station: The patient has *** difficulty arising out of a deep-seated chair without the use of the hands. The patient's stride length is ***.  The patient has a *** pull test.      ASSESSMENT/PLAN:  ***  Cc:  [name], MD

## 2018-04-06 ENCOUNTER — Ambulatory Visit: Payer: 59 | Admitting: Neurology

## 2018-04-14 ENCOUNTER — Encounter: Payer: Self-pay | Admitting: Neurology

## 2018-04-14 ENCOUNTER — Ambulatory Visit: Payer: 59 | Admitting: Neurology

## 2018-04-14 VITALS — BP 134/80 | HR 60 | Ht 73.0 in | Wt 207.0 lb

## 2018-04-14 DIAGNOSIS — R7401 Elevation of levels of liver transaminase levels: Secondary | ICD-10-CM

## 2018-04-14 DIAGNOSIS — R740 Nonspecific elevation of levels of transaminase and lactic acid dehydrogenase [LDH]: Secondary | ICD-10-CM

## 2018-04-14 DIAGNOSIS — G4733 Obstructive sleep apnea (adult) (pediatric): Secondary | ICD-10-CM

## 2018-04-14 DIAGNOSIS — R251 Tremor, unspecified: Secondary | ICD-10-CM

## 2018-04-14 NOTE — Progress Notes (Signed)
Brandon [name] was seen today in the movement disorders clinic for neurologic consultation at the request of Little, Caryn Bee, MD.  The consultation is for the evaluation of intention tremor, R hand.  The records that were made available to me were reviewed.  Tremor: Yes.     How long has it been going on? 2-3 months, but it is getting better.  Seemed to come on acutely.  Got so bad he can hardly write but hardly noticeable now  At rest or with activation?  Activation - holding paper/fork  Fam hx of tremor?  No.  Located where?  r hand  Affected by caffeine:  Unknown (1/2 cup coffee per day)  Affected by alcohol:  No.  Affected by stress:  Yes.   - wife has stage 4 breast CA and is stressed  Affected by fatigue:  No.  Spills soup if on spoon:  No. but did   No new med changes and is unsure why things got better.      Other Specific Symptoms:  Voice: no change Sleep: just saw Dr. Earl Gala and placed on CPAP but hasn't even gotten it yet ([facility] 44)  Vivid Dreams:  No.  Acting out dreams:  Yes.   - sleep walks Wet Pillows: No. Postural symptoms:  No.  Falls?  No. Bradykinesia symptoms: no bradykinesia noted Loss of smell:  No. Loss of taste:  No. Urinary Incontinence:  No. (some nocturia due to BPH) Difficulty Swallowing:  Yes.  , occasionally  Handwriting, micrographia: No. but notes tremor Trouble with ADL's:  No.  Trouble buttoning clothing: No. Depression:  No. Memory changes:  Yes.   (short term) Hallucinations:  No.  visual distortions: No. N/V:  No. Lightheaded:  No.  Syncope: No. Diplopia:  No. Dyskinesia:  No.   Last MRI brain in 2015 due to hearing loss/tinnitus.  I personally reviewed this.  It was essentially unremarkable.  PREVIOUS MEDICATIONS: none to date  ALLERGIES:   Allergies  Allergen Reactions  . Ceftin [Cefuroxime Axetil] Diarrhea  . Oxycodone Nausea And Vomiting  . Iohexol     Patient started sneezing during the injection and continued sneezing  after exam. ER doc notified and patient was given benadryl and cortisone for this. 03/10/17.    CURRENT MEDICATIONS:  Outpatient Encounter Medications as of 04/14/2018  Medication Sig  . cholecalciferol (VITAMIN D) 1000 units tablet Take 1,000 Units by mouth daily.  Marland Kitchen OVER THE COUNTER MEDICATION Gallbladder supplement CBD oil  . Probiotic Product (PROBIOTIC-10 PO) Take by mouth.  . thyroid (ARMOUR) 65 MG tablet Take 97.5-130 mg by mouth 2 (two) times daily. Pt takes two tablets in the morning and one and one-half tablet in the afternoon.  . triamterene-hydrochlorothiazide (DYAZIDE) 37.5-25 MG capsule Take 1 capsule by mouth daily.  . [DISCONTINUED] acidophilus (RISAQUAD) CAPS capsule Take 1 capsule by mouth daily.  . [DISCONTINUED] tamsulosin (FLOMAX) 0.4 MG CAPS capsule Take 1 capsule (0.4 mg total) by mouth daily after supper.   No facility-administered encounter medications on file as of 04/14/2018.     PAST MEDICAL HISTORY:   Past Medical History:  Diagnosis Date  . Atrial fibrillation (HCC)   . Cervical spine disease   . Chest pain 2007   none since  . Gallbladder disorder   . Hemopneumothorax on left   . Hiatal hernia   . Hypercholesteremia   . Hyperlipidemia   . Meniere disease   . PONV (postoperative nausea and vomiting)    also  has trouble waking up    PAST SURGICAL HISTORY:   Past Surgical History:  Procedure Laterality Date  . CARDIAC CATHETERIZATION    . COLONOSCOPY    . ORIF CLAVICULAR FRACTURE Left 04/18/2016   Procedure: OPEN REDUCTION INTERNAL FIXATION (ORIF) LEFT CLAVICLE FRACTURE;  Surgeon: Francena Hanly, MD;  Location: [facility] OR;  Service: Orthopedics;  Laterality: Left;  . THYROIDECTOMY    . TONSILLECTOMY      SOCIAL HISTORY:   Social History   Socioeconomic History  . Marital status: Married    Spouse name: Not on file  . Number of children: Not on file  . Years of education: Not on file  . Highest education level: Not on file  Occupational History    . Not on file  Social Needs  . Financial resource strain: Not on file  . Food insecurity:    Worry: Not on file    Inability: Not on file  . Transportation needs:    Medical: Not on file    Non-medical: Not on file  Tobacco Use  . Smoking status: Former Smoker    Last attempt to quit: 04/18/1979    Years since quitting: 39.0  . Smokeless tobacco: Never Used  Substance and Sexual Activity  . Alcohol use: Yes    Comment: 1-2 drinks a week  . Drug use: No  . Sexual activity: Not on file  Lifestyle  . Physical activity:    Days per week: Not on file    Minutes per session: Not on file  . Stress: Not on file  Relationships  . Social connections:    Talks on phone: Not on file    Gets together: Not on file    Attends religious service: Not on file    Active member of club or organization: Not on file    Attends meetings of clubs or organizations: Not on file    Relationship status: Not on file  . Intimate partner violence:    Fear of current or ex partner: Not on file    Emotionally abused: Not on file    Physically abused: Not on file    Forced sexual activity: Not on file  Other Topics Concern  . Not on file  Social History Narrative  . Not on file    FAMILY HISTORY:   Family Status  Relation Name Status  . Mother  Deceased  . Father  Deceased  . Sister x3 Alive  . Brother International aid/development worker  . Son  Alive  . Daughter  Alive    ROS:  A complete 10 system review of systems was obtained and was unremarkable apart from what is mentioned above.  PHYSICAL EXAMINATION:    VITALS:   Vitals:   04/14/18 0858  BP: 134/80  Pulse: 60  SpO2: 96%  Weight: 207 lb (93.9 kg)  Height:  (1.854 m)    GEN:  The patient appears stated age and is in NAD. HEENT:  Normocephalic, atraumatic.  The mucous membranes are moist. The superficial temporal arteries are without ropiness or tenderness. CV:  RRR Lungs:  CTAB Neck/HEME:  There are no carotid bruits  bilaterally.  Neurological examination:  Orientation: The patient is alert and oriented x3. Fund of knowledge is appropriate.  Recent and remote memory are intact.  Attention and concentration are normal.    Able to name objects and repeat phrases. Cranial nerves: There is good facial symmetry. There is facial hypomimia.  Pupils are equal round and  reactive to light bilaterally. Fundoscopic exam reveals clear margins bilaterally. Extraocular muscles are intact. The visual fields are full to confrontational testing. The speech is fluent and clear. Soft palate rises symmetrically and there is no tongue deviation. Hearing is intact to conversational tone. Sensation: Sensation is intact to light and pinprick throughout (facial, trunk, extremities). Vibration is intact at the bilateral big toe. There is no extinction with double simultaneous stimulation. There is no sensory dermatomal level identified. Motor: Strength is 5/5 in the bilateral upper and lower extremities.   Shoulder shrug is equal and symmetric.  There is no pronator drift. Deep tendon reflexes: Deep tendon reflexes are 2/4 at the bilateral biceps, triceps, brachioradialis, patella and achilles. Plantar responses are downgoing bilaterally.  Movement examination: Tone: There is normal tone in the bilateral upper extremities.  The tone in the lower extremities is normal.  Abnormal movements: There is no intention tremor.  No postural tremor.  There is RUE resting tremor with distraction procedures only.  he has no difficulty when asked to pour a full glass of water from one glass to another. Coordination:  There is no decremation with RAM's, with any form of RAMS, including alternating supination and pronation of the forearm, hand opening and closing, finger taps, heel taps and toe taps. Gait and Station: The patient has no difficulty arising out of a deep-seated chair without the use of the hands. The patient's stride length is good with good  arm swing.  The patient has a negative pull test.      Received lab work from primary care office.  This is dated January 30, 2018.  TSH was 0.43.  Sodium is 139, potassium 4.3, chloride 101, CO2 29, BUN 17 and creatinine 0.90.  AST is 101, ALT 73.  White blood cells are 4.8, hemoglobin 16.1, hematocrit 48.2 and platelets 203.  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  However, that does not mean that he is not getting the disease in the future.  I see no other features today, with the exception of tremor.  He should, however, be followed in the clinic at least on an every 6 months basis and he was agreeable.  -I strongly recommended safe, cardiovascular exercise to the patient.  -Patient asked several questions and I answered them to the best of my ability.  -No further neuroimaging is necessary at this point in time.  His neuro exam is nonfocal and nonlateralizing, with the exception of resting tremor.  2.  Transaminasemia.  -Being followed by primary care.  Denies EtOH more than 1-2 days/week  3.  OSAS  -seeing Dr. Earl Gala  -just ordered CPAP but not yet started on it.  -We discussed morbidity and mortality associated with untreated sleep apnea.  The patient expressed understanding.  4.  F/u 6 months, sooner should new neuro issues arise.    Cc:  [name], MD

## 2018-10-14 NOTE — Progress Notes (Signed)
Brandon [name] was seen today in the movement disorders clinic for parkinsonian tremor (without meeting criteria for PD).  Patient reports that he has been doing about the same.  No falls since last visit.  No lightheadedness or near syncope.  No hallucinations.  No new medications.  Does state that last month he was having flashes in the L eye that would last 1 min.  If he closed the L eye, it was still there.  It went away after a month.  No lateralizing weakness or paresthesias.  No visual loss.  He sees his eye doctor next wed, Dr. Emily [name].  He is wearing his CPAP now.  PREVIOUS MEDICATIONS: none to date  ALLERGIES:   Allergies  Allergen Reactions  . Ceftin [Cefuroxime Axetil] Diarrhea  . Oxycodone Nausea And Vomiting  . Iohexol     Patient started sneezing during the injection and continued sneezing after exam. ER doc notified and patient was given benadryl and cortisone for this. 03/10/17.    CURRENT MEDICATIONS:  Outpatient Encounter Medications as of 10/15/2018  Medication Sig  . cholecalciferol (VITAMIN D) 1000 units tablet Take 1,000 Units by mouth daily.  Marland Kitchen OVER THE COUNTER MEDICATION Gallbladder supplement CBD oil  . Probiotic Product (PROBIOTIC-10 PO) Take by mouth.  . thyroid (ARMOUR) 65 MG tablet Take 97.5-130 mg by mouth 2 (two) times daily. Pt takes two tablets in the morning and one and one-half tablet in the afternoon.  . triamterene-hydrochlorothiazide (DYAZIDE) 37.5-25 MG capsule Take 1 capsule by mouth daily.   No facility-administered encounter medications on file as of 10/15/2018.     PAST MEDICAL HISTORY:   Past Medical History:  Diagnosis Date  . Atrial fibrillation (HCC)   . BPH (benign prostatic hyperplasia)   . Cervical spine disease   . Chest pain 2007   none since  . Gallbladder disorder   . Hemopneumothorax on left   . Hiatal hernia   . Hypercholesteremia   . Hyperlipidemia   . Meniere disease   . Meniere's disease   . PONV (postoperative  nausea and vomiting)    also has trouble waking up    PAST SURGICAL HISTORY:   Past Surgical History:  Procedure Laterality Date  . CARDIAC CATHETERIZATION    . COLONOSCOPY    . ORIF CLAVICULAR FRACTURE Left 04/18/2016   Procedure: OPEN REDUCTION INTERNAL FIXATION (ORIF) LEFT CLAVICLE FRACTURE;  Surgeon: Francena Hanly, MD;  Location: [facility] OR;  Service: Orthopedics;  Laterality: Left;  . THYROIDECTOMY    . TONSILLECTOMY      SOCIAL HISTORY:   Social History   Socioeconomic History  . Marital status: Married    Spouse name: Not on file  . Number of children: Not on file  . Years of education: Not on file  . Highest education level: Not on file  Occupational History    Comment: factory work  Social Needs  . Financial resource strain: Not on file  . Food insecurity:    Worry: Not on file    Inability: Not on file  . Transportation needs:    Medical: Not on file    Non-medical: Not on file  Tobacco Use  . Smoking status: Former Smoker    Last attempt to quit: 04/18/1979    Years since quitting: 39.5  . Smokeless tobacco: Never Used  Substance and Sexual Activity  . Alcohol use: Yes    Comment: 1-2 drinks a week  . Drug use: No  . Sexual activity: Not  on file  Lifestyle  . Physical activity:    Days per week: Not on file    Minutes per session: Not on file  . Stress: Not on file  Relationships  . Social connections:    Talks on phone: Not on file    Gets together: Not on file    Attends religious service: Not on file    Active member of club or organization: Not on file    Attends meetings of clubs or organizations: Not on file    Relationship status: Not on file  . Intimate partner violence:    Fear of current or ex partner: Not on file    Emotionally abused: Not on file    Physically abused: Not on file    Forced sexual activity: Not on file  Other Topics Concern  . Not on file  Social History Narrative  . Not on file    FAMILY HISTORY:   Family Status    Relation Name Status  . Mother  Deceased  . Father  Deceased  . Sister x3 Alive  . Brother International aid/development worker  . Son  Alive  . Daughter  Alive    ROS:  Review of Systems  Constitutional: Negative.   HENT: Negative.   Respiratory: Negative.   Cardiovascular: Negative.   Musculoskeletal: Negative.   Skin: Negative.   Neurological: Positive for tremors (Comes and goes.  Had significant tremor yesterday, but none today).     PHYSICAL EXAMINATION:    VITALS:   Vitals:   10/15/18 1528  BP: 120/76  Pulse: 76  SpO2: 96%  Weight: 208 lb (94.3 kg)  Height: 6\' 1"  (1.854 m)    GEN:  The patient appears stated age and is in NAD. HEENT:  Normocephalic, atraumatic.  The mucous membranes are moist. The superficial temporal arteries are without ropiness or tenderness. CV:  RRR Lungs:  CTAB Neck/HEME:  There are no carotid bruits bilaterally.  Neurological examination:  Orientation: The patient is alert and oriented x3. Cranial nerves: There is good facial symmetry. The speech is fluent and clear. Soft palate rises symmetrically and there is no tongue deviation. Hearing is intact to conversational tone. Sensation: Sensation is intact to light touch throughout Motor: Strength is 5/5 in the bilateral upper and lower extremities.   Shoulder shrug is equal and symmetric.  There is no pronator drift.   Movement examination: Tone: There is slight increase tone in the RUE Abnormal movements: There is no rest tremor today Coordination:  There is no decremation with RAM's, with any form of RAMS, including alternating supination and pronation of the forearm, hand opening and closing, finger taps, heel taps and toe taps. Gait and Station: The patient has no difficulty arising out of a deep-seated chair without the use of the hands. The patient's stride length is good with good arm swing.  The patient has a negative pull test.      Received lab work from primary care office.  This is dated January 30, 2018.  TSH was 0.43.  Sodium is 139, potassium 4.3, chloride 101, CO2 29, BUN 17 and creatinine 0.90.  AST is 101, ALT 73.  White blood cells are 4.8, hemoglobin 16.1, hematocrit 48.2 and platelets 203.  ASSESSMENT/PLAN:  1.  Parkinsonism  -Patient is closer to meeting criteria today.  Last time, he had tremor but no rigidity at this time he has slight rigidity, but did not notice the tremor.  If he has both, then he  would meet the [place] brain [place].  He and I discussed this today.  My suspicion is that within the next visit or 2, he will meet the criteria.  He and I talked about the importance of safe, cardiovascular exercise.  He is really not doing very much in regards to exercise.  2.  Transaminasemia.  -Being followed by primary care.  Denies EtOH more than 1-2 days/week  3.  OSAS  -seeing Dr. Earl Galasborne  -wearing CPAP now and EDS went away.    4. Photopsia  -He had this for a month and then it went away.  This is more likely associated with a primary retinal problem than it is a central problem, but I will go ahead and do an MRI of the brain to make sure we are not missing anything.  His neuro exam is nonfocal and nonlateralizing today.  5.  F/u 6 months.  Much greater than 50% of this visit was spent in counseling and coordinating care.  Total face to face time:  25 min  Cc:  Catha GosselinLittle, Kevin, MD

## 2018-10-15 ENCOUNTER — Ambulatory Visit: Payer: 59 | Admitting: Neurology

## 2018-10-15 ENCOUNTER — Encounter: Payer: Self-pay | Admitting: Neurology

## 2018-10-15 VITALS — BP 120/76 | HR 76 | Ht 73.0 in | Wt 208.0 lb

## 2018-10-15 DIAGNOSIS — H5319 Other subjective visual disturbances: Secondary | ICD-10-CM

## 2018-10-15 DIAGNOSIS — H538 Other visual disturbances: Secondary | ICD-10-CM | POA: Diagnosis not present

## 2018-10-15 DIAGNOSIS — G20 Parkinson's disease: Secondary | ICD-10-CM

## 2018-11-02 ENCOUNTER — Ambulatory Visit
Admission: RE | Admit: 2018-11-02 | Discharge: 2018-11-02 | Disposition: A | Discharge status: Home / Self Care | Payer: 59 | Source: Ambulatory Visit | Attending: Neurology | Admitting: Neurology

## 2018-11-02 DIAGNOSIS — H5319 Other subjective visual disturbances: Secondary | ICD-10-CM

## 2018-11-02 DIAGNOSIS — H538 Other visual disturbances: Secondary | ICD-10-CM

## 2018-11-03 ENCOUNTER — Telehealth: Payer: Self-pay | Admitting: Neurology

## 2018-11-03 NOTE — Telephone Encounter (Signed)
-----   Message from Octaviano Battyebecca S Tat, DO sent at 11/03/2018  7:35 AM EST ----- Let pt know that MRI brain essentially stable since 2015

## 2018-11-03 NOTE — Telephone Encounter (Signed)
Mychart message sent to patient.

## 2018-11-09 ENCOUNTER — Telehealth: Payer: Self-pay | Admitting: Neurology

## 2018-11-09 NOTE — Telephone Encounter (Signed)
Spoke with patient and made him aware of results 

## 2018-11-09 NOTE — Telephone Encounter (Signed)
Patient called regarding his MRI Results. Thanks

## 2018-11-09 NOTE — Telephone Encounter (Signed)
Left message on machine for patient to call back.  To let him know MR stable since 2015. I did send this is [employer]. Awaiting call back from patient.

## 2019-04-15 ENCOUNTER — Ambulatory Visit: Payer: 59 | Admitting: Neurology

## 2019-09-13 NOTE — Progress Notes (Signed)
Brandon [name] was seen today in follow up for Parkinsonism.  Patient has not previously met criteria for Parkinson's disease.  tremor has been about the same.  He is painting the house and "I can still trim out the room."  He has not been exercising because the gyms have been closed.  We have been monitoring him for this.  Pt denies falls.  Pt denies lightheadedness, near syncope.  No hallucinations.  Mood has been good.  MRI of the brain was completed since last visit.  I personally reviewed it.  It was unremarkable and stable since 2015.  States that he played golf the other day and had not played in 10 years.  He woke up the following day and he didn't think that the arm was working well but he cannot state that it was weak.  No numbness.  The following day his neck hurt and he went to the chiropractor and he "adjusted him" and it went back to normal.  No leg or speech changes  Current movement disorder medications: n/a   ALLERGIES:   Allergies  Allergen Reactions  . Ceftin [Cefuroxime Axetil] Diarrhea  . Oxycodone Nausea And Vomiting  . Iohexol     Patient started sneezing during the injection and continued sneezing after exam. ER doc notified and patient was given benadryl and cortisone for this. 03/10/17.    CURRENT MEDICATIONS:  Outpatient Encounter Medications as of 09/14/2019  Medication Sig  . cholecalciferol (VITAMIN D) 1000 units tablet Take 1,000 Units by mouth daily.  Marland Kitchen OVER THE COUNTER MEDICATION Gallbladder supplement CBD oil  . Probiotic Product (PROBIOTIC-10 PO) Take by mouth.  . testosterone cypionate (DEPOTESTOSTERONE CYPIONATE) 200 MG/ML injection INJECT 0.6 MLS INTRAMUSCULARLY INTO LATERAL THIGH WEEKLY.  Marland Kitchen thyroid (ARMOUR) 65 MG tablet Take 97.5-130 mg by mouth 2 (two) times daily. Pt takes two tablets in the morning  . triamterene-hydrochlorothiazide (DYAZIDE) 37.5-25 MG capsule Take 1 capsule by mouth daily.   No facility-administered encounter medications on  file as of 09/14/2019.     PAST MEDICAL HISTORY:   Past Medical History:  Diagnosis Date  . Atrial fibrillation ([place])   . BPH (benign prostatic hyperplasia)   . Cervical spine disease   . Chest pain 2007   none since  . Gallbladder disorder   . Hemopneumothorax on left   . Hiatal hernia   . Hypercholesteremia   . Hyperlipidemia   . Meniere disease   . Meniere's disease   . PONV (postoperative nausea and vomiting)    also has trouble waking up    PAST SURGICAL HISTORY:   Past Surgical History:  Procedure Laterality Date  . CARDIAC CATHETERIZATION    . COLONOSCOPY    . ORIF CLAVICULAR FRACTURE Left 04/18/2016   Procedure: OPEN REDUCTION INTERNAL FIXATION (ORIF) LEFT CLAVICLE FRACTURE;  Surgeon: Justice Britain, MD;  Location: [facility];  Service: Orthopedics;  Laterality: Left;  . THYROIDECTOMY    . TONSILLECTOMY      SOCIAL HISTORY:   Social History   Socioeconomic History  . Marital status: Married    Spouse name: Not on file  . Number of children: Not on file  . Years of education: Not on file  . Highest education level: Not on file  Occupational History    Comment: factory work  Social Needs  . Financial resource strain: Not on file  . Food insecurity    Worry: Not on file    Inability: Not on file  .  Transportation needs    Medical: Not on file    Non-medical: Not on file  Tobacco Use  . Smoking status: Former Smoker    Quit date: 04/18/1979    Years since quitting: 40.4  . Smokeless tobacco: Never Used  Substance and Sexual Activity  . Alcohol use: Yes    Comment: 1-2 drinks a week  . Drug use: No  . Sexual activity: Not on file  Lifestyle  . Physical activity    Days per week: Not on file    Minutes per session: Not on file  . Stress: Not on file  Relationships  . Social Herbalist on phone: Not on file    Gets together: Not on file    Attends religious service: Not on file    Active member of club or organization: Not on file    Attends  meetings of clubs or organizations: Not on file    Relationship status: Not on file  . Intimate partner violence    Fear of current or ex partner: Not on file    Emotionally abused: Not on file    Physically abused: Not on file    Forced sexual activity: Not on file  Other Topics Concern  . Not on file  Social History Narrative   Two level home alone   Right handed   Caffeine - one cup coffee am    Exercise - yes walk dogs   Education - 1 year of college     FAMILY HISTORY:   Family Status  Relation Name Status  . Mother  Deceased  . Father  Deceased  . Sister x3 Alive  . Brother Risk analyst  . Son  Alive  . Daughter  Alive    ROS:  Review of Systems  Constitutional: Negative.   HENT: Negative.   Eyes: Negative.   Cardiovascular: Negative.   Gastrointestinal: Negative.   Genitourinary: Negative.   Skin: Negative.   Endo/Heme/Allergies: Negative.     PHYSICAL EXAMINATION:    VITALS:   Vitals:   09/14/19 1441  BP: (!) 147/80  Pulse: 67  Temp: (!) 97 F (36.1 C)  SpO2: 97%  Weight: 196 lb (88.9 kg)  Height: '6\' 1"'  (1.854 m)    GEN:  The patient appears stated age and is in NAD. HEENT:  Normocephalic, atraumatic.  The mucous membranes are moist. The superficial temporal arteries are without ropiness or tenderness. CV:  RRR Lungs:  CTAB Neck/HEME:  There are no carotid bruits bilaterally.  Neurological examination:  Orientation: The patient is alert and oriented x3. Cranial nerves: There is good facial symmetry with mild facial hypomimia. The speech is fluent and clear. Soft palate rises symmetrically and there is no tongue deviation. Hearing is intact to conversational tone. Sensation: Sensation is intact to light touch throughout Motor: Strength is 5/5 in the UE/LE  Movement examination: Tone: There is mild increased tone with activation procedures only Abnormal movements: there is R>LUE rest tremor with distraction Coordination:  There is no decremation  with RAM's, with any form of RAMS, including alternating supination and pronation of the forearm, hand opening and closing, finger taps, heel taps and toe taps. Gait and Station: The patient has no difficulty arising out of a deep-seated chair without the use of the hands. The patient's stride length is good.     ASSESSMENT/PLAN:  1.  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    -We discussed that it used to be thought that levodopa would increase risk of melanoma but now it is believed that Parkinsons itself likely increases risk of melanoma. he is to get regular skin checks.  -  We talked about medication options as well as potential future surgical options.  We talked about safety in the home.  -We decided to hold medication  -We discussed community resources in the area including patient support groups and community exercise programs for PD and pt education was provided to the patient.   2.  Transaminasemia  -Being followed by primary care.  3.  Obstructive sleep apnea syndrome  -Understands morbidity and mortality associated with untreated sleep apnea.  Wearing CPAP faithfully.  4.  Follow up is anticipated in the next 4-6 months, sooner should new neurologic issues arise.  Much greater than 50% of this visit was spent in counseling and coordinating care.  Total face to face time:  25 min  Cc:  Hulan Fess, MD

## 2019-09-14 ENCOUNTER — Ambulatory Visit (INDEPENDENT_AMBULATORY_CARE_PROVIDER_SITE_OTHER): Payer: 59 | Admitting: Neurology

## 2019-09-14 ENCOUNTER — Other Ambulatory Visit: Payer: Self-pay

## 2019-09-14 ENCOUNTER — Encounter: Payer: Self-pay | Admitting: Neurology

## 2019-09-14 VITALS — BP 147/80 | HR 67 | Temp 97.0°F | Ht 73.0 in | Wt 196.0 lb

## 2019-09-14 DIAGNOSIS — G20 Parkinson's disease: Secondary | ICD-10-CM

## 2019-09-14 NOTE — Patient Instructions (Signed)
1.  Follow with the dermatologist yearly for a skin check.  Parkinsons increases risk for melanoma 2.  We will hold off on medication for now.  Good to see you!  The physicians and staff at [facility] are committed to providing excellent care. You may receive a survey requesting feedback about your experience at our office. We strive to receive "very good" responses to the survey questions. If you feel that your experience would prevent you from giving the office a "very good " response, please contact our office to try to remedy the situation. We may be reached at [phone]. Thank you for taking the time out of your busy day to complete the survey.

## 2020-01-27 ENCOUNTER — Ambulatory Visit: Payer: Medicare Other | Attending: Internal Medicine

## 2020-01-27 DIAGNOSIS — Z23 Encounter for immunization: Secondary | ICD-10-CM | POA: Insufficient documentation

## 2020-01-27 NOTE — Progress Notes (Signed)
   Covid-19 Vaccination Clinic  Name:  Brandon [name]    MRN: [id number] DOB: May 31, 1951  01/27/2020  [name] was observed post Covid-19 immunization for 15 minutes without incident. He was provided with Vaccine Information Sheet and instruction to access the V-Safe system.   [name] was instructed to call 911 with any severe reactions post vaccine: Marland Kitchen Difficulty breathing  . Swelling of face and throat  . A fast heartbeat  . A bad rash all over body  . Dizziness and weakness   Immunizations Administered    Name Date Dose VIS Date Route   Pfizer COVID-19 Vaccine 01/27/2020  3:14 PM 0.3 mL 11/05/2019 Intramuscular   Manufacturer: [employer], [employer]   Lot: [id number]   NDC: [address]-3

## 2020-02-23 ENCOUNTER — Ambulatory Visit: Payer: Medicare Other | Attending: Internal Medicine

## 2020-02-23 DIAGNOSIS — Z23 Encounter for immunization: Secondary | ICD-10-CM

## 2020-02-23 NOTE — Progress Notes (Signed)
   Covid-19 Vaccination Clinic  Name:  ALECSANDER HATTABAUGH    MRN: [id number] DOB: 02-Feb-1951  02/23/2020  Mr. Arriaga was observed post Covid-19 immunization for 15 minutes without incident. He was provided with Vaccine Information Sheet and instruction to access the V-Safe system.   Mr. Neubert was instructed to call 911 with any severe reactions post vaccine: Marland Kitchen Difficulty breathing  . Swelling of face and throat  . A fast heartbeat  . A bad rash all over body  . Dizziness and weakness   Immunizations Administered    Name Date Dose VIS Date Route   Pfizer COVID-19 Vaccine 02/23/2020  8:56 AM 0.3 mL 11/05/2019 Intramuscular   Manufacturer: [employer], [employer]   Lot: [id number]   NDC: [address]-0

## 2020-03-14 ENCOUNTER — Ambulatory Visit: Payer: 59 | Admitting: Neurology

## 2020-03-14 NOTE — Progress Notes (Signed)
Assessment/Plan:   1.  Parkinsons Disease  -We discussed various medication options and decided to hold off on that for now.  -Discussed the importance of safe, cardiovascular exercise.  -We discussed that it used to be thought that levodopa would increase risk of melanoma but now it is believed that Parkinsons itself likely increases risk of melanoma. he is to get regular skin checks.  Just had his visit today with Dr. Yetta Barre today.  -need to be careful with amount of melatonin he is taking.  He is seeing a naturopath in [place] and told to take 60mg !  I told him it was studied in Parkinsons Disease from 3mg -10mg  max.   2.  Transaminasemia  -Being followed by primary care. 3.  OSAS  -Faithful with CPAP  Subjective:   Brandon [name] was seen today in follow up for newly diagnosed Parkinsons disease.  My previous records were reviewed prior to todays visit as well as outside records available to me.  Doesn't think that he is getting worse.  Stress/caffeine will make tremor worse.  Pt denies falls.  Pt denies lightheadedness, near syncope.  No hallucinations.  Mood has been good.  Saw Dr. from dermatology today and all was good.  He is exercising at the gym 3 days a week. He did retire since our last visit.   Current prescribed movement disorder medications: None   PREVIOUS MEDICATIONS: none to date  ALLERGIES:   Allergies  Allergen Reactions  . Ceftin [Cefuroxime Axetil] Diarrhea  . Oxycodone Nausea And Vomiting  . Iohexol     Patient started sneezing during the injection and continued sneezing after exam. ER doc notified and patient was given benadryl and cortisone for this. 03/10/17.    CURRENT MEDICATIONS:  Outpatient Encounter Medications as of 03/16/2020  Medication Sig  . cholecalciferol (VITAMIN D) 1000 units tablet Take 1,000 Units by mouth daily.  Yetta Barre OVER THE COUNTER MEDICATION Gallbladder supplement CBD oil q hs for sleeping  . Probiotic Product (PROBIOTIC-10  PO) Take 1 capsule by mouth daily.   03/12/17 testosterone cypionate (DEPOTESTOSTERONE CYPIONATE) 200 MG/ML injection INJECT 0.6 MLS INTRAMUSCULARLY INTO LATERAL THIGH WEEKLY.  03/18/2020 thyroid (ARMOUR) 65 MG tablet Take 97.5-130 mg by mouth 2 (two) times daily. Pt takes two tablets in the morning  . triamterene-hydrochlorothiazide (DYAZIDE) 37.5-25 MG capsule Take 1 capsule by mouth daily.   No facility-administered encounter medications on file as of 03/16/2020.    Objective:   PHYSICAL EXAMINATION:    VITALS:   Vitals:   03/16/20 1042  Weight: 198 lb (89.8 kg)  Height: 6\' 1"  (1.854 m)    GEN:  The patient appears stated age and is in NAD. HEENT:  Normocephalic, atraumatic.  The mucous membranes are moist. The superficial temporal arteries are without ropiness or tenderness. CV:  RRR Lungs:  CTAB Neck/HEME:  There are no carotid bruits bilaterally.  Neurological examination:  Orientation: The patient is alert and oriented x3. Cranial nerves: There is good facial symmetry with min facial hypomimia. The speech is fluent and clear. Soft palate rises symmetrically and there is no tongue deviation. Hearing is intact to conversational tone. Sensation: Sensation is intact to light touch throughout Motor: Strength is at least antigravity x4.  Movement examination: Tone: There is normal tone in the UE/LE Abnormal movements: rare tremor is felt (not seen) in both UE with distraction Coordination:  There is min decremation with RAM's, with finger taps on the right Gait and Station: The patient has  no difficulty arising out of a deep-seated chair without the use of the hands. The patient's stride length is good.     Total time spent on today's visit was 30 minutes, including both face-to-face time and nonface-to-face time.  Time included that spent on review of records (prior notes available to me/labs/imaging if pertinent), discussing treatment and goals, answering patient's questions and coordinating  care.  Cc:  Hulan Fess, MD

## 2020-03-16 ENCOUNTER — Ambulatory Visit: Payer: Medicare Other | Admitting: Neurology

## 2020-03-16 ENCOUNTER — Encounter: Payer: Self-pay | Admitting: Neurology

## 2020-03-16 ENCOUNTER — Other Ambulatory Visit: Payer: Self-pay

## 2020-03-16 VITALS — BP 139/78 | HR 63 | Ht 73.0 in | Wt 198.0 lb

## 2020-03-16 DIAGNOSIS — G20 Parkinson's disease: Secondary | ICD-10-CM

## 2020-03-16 NOTE — Patient Instructions (Signed)
1.  Set a regular daily schedule! 2.  Exercise!  The physicians and staff at [facility] are committed to providing excellent care. You may receive a survey requesting feedback about your experience at our office. We strive to receive "very good" responses to the survey questions. If you feel that your experience would prevent you from giving the office a "very good " response, please contact our office to try to remedy the situation. We may be reached at [phone]. Thank you for taking the time out of your busy day to complete the survey.

## 2020-06-21 ENCOUNTER — Other Ambulatory Visit: Payer: Self-pay | Admitting: Family Medicine

## 2020-06-21 DIAGNOSIS — Z136 Encounter for screening for cardiovascular disorders: Secondary | ICD-10-CM

## 2020-07-03 ENCOUNTER — Emergency Department (HOSPITAL_COMMUNITY): Payer: Medicare Other

## 2020-07-03 ENCOUNTER — Encounter (HOSPITAL_COMMUNITY): Payer: Self-pay | Admitting: Emergency Medicine

## 2020-07-03 ENCOUNTER — Ambulatory Visit: Payer: Medicare Other

## 2020-07-03 ENCOUNTER — Emergency Department (HOSPITAL_COMMUNITY)
Admission: EM | Admit: 2020-07-03 | Discharge: 2020-07-04 | Disposition: A | Discharge status: Home / Self Care | Payer: Medicare Other | Attending: Emergency Medicine | Admitting: Emergency Medicine

## 2020-07-03 ENCOUNTER — Other Ambulatory Visit: Payer: Self-pay

## 2020-07-03 DIAGNOSIS — Z5321 Procedure and treatment not carried out due to patient leaving prior to being seen by health care provider: Secondary | ICD-10-CM | POA: Diagnosis not present

## 2020-07-03 DIAGNOSIS — R002 Palpitations: Secondary | ICD-10-CM | POA: Diagnosis not present

## 2020-07-03 DIAGNOSIS — R079 Chest pain, unspecified: Secondary | ICD-10-CM | POA: Insufficient documentation

## 2020-07-03 DIAGNOSIS — R0602 Shortness of breath: Secondary | ICD-10-CM | POA: Diagnosis not present

## 2020-07-03 LAB — CBC
HCT: 54.8 % — ABNORMAL HIGH (ref 39.0–52.0)
Hemoglobin: 17.9 g/dL — ABNORMAL HIGH (ref 13.0–17.0)
MCH: 31.2 pg (ref 26.0–34.0)
MCHC: 32.7 g/dL (ref 30.0–36.0)
MCV: 95.5 fL (ref 80.0–100.0)
Platelets: 255 10*3/uL (ref 150–400)
RBC: 5.74 MIL/uL (ref 4.22–5.81)
RDW: 13.3 % (ref 11.5–15.5)
WBC: 7.1 10*3/uL (ref 4.0–10.5)
nRBC: 0 % (ref 0.0–0.2)

## 2020-07-03 LAB — BASIC METABOLIC PANEL
Anion gap: 12 (ref 5–15)
BUN: 15 mg/dL (ref 8–23)
CO2: 27 mmol/L (ref 22–32)
Calcium: 9.4 mg/dL (ref 8.9–10.3)
Chloride: 98 mmol/L (ref 98–111)
Creatinine, Ser: 0.93 mg/dL (ref 0.61–1.24)
GFR calc Af Amer: >60 mL/min (ref >60)
GFR calc non Af Amer: >60 mL/min (ref >60)
Glucose, Bld: 95 mg/dL (ref 70–99)
Potassium: 3.8 mmol/L (ref 3.5–5.1)
Sodium: 137 mmol/L (ref 135–145)

## 2020-07-03 LAB — PROTIME-INR
INR: 1.1 (ref 0.8–1.2)
Prothrombin Time: 13.4 seconds (ref 11.4–15.2)

## 2020-07-03 LAB — TROPONIN I (HIGH SENSITIVITY): Troponin I (High Sensitivity): 5 ng/L (ref <18)

## 2020-07-03 MED ORDER — SODIUM CHLORIDE 0.9% FLUSH: 3.0000 mL | Freq: Once | INTRAVENOUS | Status: DC

## 2020-07-03 NOTE — ED Triage Notes (Signed)
Patient reports central chest pain with palpitations and mild SOB onset this morning , no emesis or diaphoresis , no cough or fever .

## 2020-07-04 LAB — TROPONIN I (HIGH SENSITIVITY): Troponin I (High Sensitivity): 5 ng/L (ref <18)

## 2020-07-04 NOTE — ED Notes (Signed)
Pt left due to not being seen quick enough 

## 2020-07-14 ENCOUNTER — Other Ambulatory Visit: Payer: Self-pay | Admitting: Unknown Physician Specialty

## 2020-07-14 ENCOUNTER — Telehealth: Payer: Self-pay | Admitting: Unknown Physician Specialty

## 2020-07-14 DIAGNOSIS — U071 COVID-19: Secondary | ICD-10-CM

## 2020-07-14 NOTE — Telephone Encounter (Signed)
I connected by phone with Brandon [name] on 07/14/2020 at 9:18 AM to discuss the potential use of a new treatment for mild to moderate COVID-19 viral infection in non-hospitalized patients.  This patient is a 68 y.o. male that meets the FDA criteria for Emergency Use Authorization of COVID monoclonal antibody casirivimab/imdevimab.  Has a (+) direct SARS-CoV-2 viral test result  Has mild or moderate COVID-19   Is NOT hospitalized due to COVID-19  Is within 10 days of symptom onset  Has at least one of the high risk factor(s) for progression to severe COVID-19 and/or hospitalization as defined in EUA.  Specific high risk criteria : Older age (>/= 68 yo)   I have spoken and communicated the following to the patient or parent/caregiver regarding COVID monoclonal antibody treatment:  1. FDA has authorized the emergency use for the treatment of mild to moderate COVID-19 in adults and pediatric patients with positive results of direct SARS-CoV-2 viral testing who are 45 years of age and older weighing at least 40 kg, and who are at high risk for progressing to severe COVID-19 and/or hospitalization.  2. The significant known and potential risks and benefits of COVID monoclonal antibody, and the extent to which such potential risks and benefits are unknown.  3. Information on available alternative treatments and the risks and benefits of those alternatives, including clinical trials.  4. Patients treated with COVID monoclonal antibody should continue to self-isolate and use infection control measures (e.g., wear mask, isolate, social distance, avoid sharing personal items, clean and disinfect high touch surfaces, and frequent handwashing) according to CDC guidelines.   5. The patient or parent/caregiver has the option to accept or refuse COVID monoclonal antibody treatment.  After reviewing this information with the patient, The patient agreed to proceed with receiving casirivimab\imdevimab  infusion and will be provided a copy of the Fact sheet prior to receiving the infusion. [name] 07/14/2020 9:18 AM

## 2020-07-15 ENCOUNTER — Ambulatory Visit (HOSPITAL_COMMUNITY)
Admission: RE | Admit: 2020-07-15 | Discharge: 2020-07-15 | Disposition: A | Discharge status: Home / Self Care | Payer: Medicare Other | Source: Ambulatory Visit | Attending: Pulmonary Disease | Admitting: Pulmonary Disease

## 2020-07-15 DIAGNOSIS — U071 COVID-19: Secondary | ICD-10-CM | POA: Diagnosis not present

## 2020-07-15 DIAGNOSIS — Z23 Encounter for immunization: Secondary | ICD-10-CM | POA: Insufficient documentation

## 2020-07-15 MED ORDER — FAMOTIDINE IN NACL 20-0.9 MG/50ML-% IV SOLN: 20.0000 mg | Freq: Once | INTRAVENOUS | Status: DC | PRN

## 2020-07-15 MED ORDER — SODIUM CHLORIDE 0.9 % IV SOLN
1200.0000 mg | Freq: Once | INTRAVENOUS | Status: AC
  Administered 2020-07-15: 1200 mg via INTRAVENOUS
  Filled 2020-07-15: qty 10

## 2020-07-15 MED ORDER — METHYLPREDNISOLONE SODIUM SUCC 125 MG IJ SOLR: 125.0000 mg | Freq: Once | INTRAMUSCULAR | Status: DC | PRN

## 2020-07-15 MED ORDER — SODIUM CHLORIDE 0.9 % IV SOLN: INTRAVENOUS | Status: DC | PRN

## 2020-07-15 MED ORDER — ALBUTEROL SULFATE HFA 108 (90 BASE) MCG/ACT IN AERS: 2.0000 | INHALATION_SPRAY | Freq: Once | RESPIRATORY_TRACT | Status: DC | PRN

## 2020-07-15 MED ORDER — EPINEPHRINE 0.3 MG/0.3ML IJ SOAJ: 0.3000 mg | Freq: Once | INTRAMUSCULAR | Status: DC | PRN

## 2020-07-15 MED ORDER — DIPHENHYDRAMINE HCL 50 MG/ML IJ SOLN: 50.0000 mg | Freq: Once | INTRAMUSCULAR | Status: DC | PRN

## 2020-07-15 NOTE — Progress Notes (Signed)
  Diagnosis: COVID-19  Physician:Dr. Patrick Wright  Procedure: Covid Infusion Clinic Med: casirivimab\imdevimab infusion - Provided patient with casirivimab\imdevimab fact sheet for patients, parents and caregivers prior to infusion.  Complications: No immediate complications noted.  Discharge: Discharged home   Brandon [name] [name] 07/15/2020   

## 2020-07-15 NOTE — Discharge Instructions (Signed)

## 2020-07-25 ENCOUNTER — Ambulatory Visit
Admission: RE | Admit: 2020-07-25 | Discharge: 2020-07-25 | Disposition: A | Discharge status: Home / Self Care | Payer: Medicare Other | Source: Ambulatory Visit | Attending: Family Medicine | Admitting: Family Medicine

## 2020-07-25 DIAGNOSIS — Z136 Encounter for screening for cardiovascular disorders: Secondary | ICD-10-CM

## 2020-09-11 ENCOUNTER — Ambulatory Visit: Payer: Medicare Other | Admitting: Cardiovascular Disease

## 2020-09-15 NOTE — Progress Notes (Signed)
Assessment/Plan:   1.  Parkinsons Disease  -Patient wishes to hold off on medication for now.  -Patient is following with Dr. Yetta Barre for dermatology.  Understands that Parkinson's slightly increases risk for melanoma  2.  Transaminasemia  -Following with primary care.  As previous, need to be careful with the amount of supplements taken and he is no longer taking the supplements and has stopped seeing the naturopath.    Glad to see he is following with [name], MD now.   Subjective:   Brandon [name] was seen today in follow up for Parkinsons disease.  My previous records were reviewed prior to todays visit as well as outside records available to me. Pt denies falls.  Pt denies lightheadedness, near syncope.  No hallucinations.  Mood has been good.  Last visit, we did talk to him about the amount of melatonin that his naturopath in [place] told him to take (60 mg).  This is not a dose that I would recommend. He states that his PCP recommend that he stop that and he did.  When he initially stopped it, he had more tremor but that got better.   Patient was diagnosed with Covid in August and received monoclonal antibody treatment as an outpatient.  Records reviewed.  Current prescribed movement disorder medications: None   PREVIOUS MEDICATIONS: none to date  ALLERGIES:   Allergies  Allergen Reactions  . Ceftin [Cefuroxime Axetil] Diarrhea  . Oxycodone Nausea And Vomiting  . Iohexol     Patient started sneezing during the injection and continued sneezing after exam. ER doc notified and patient was given benadryl and cortisone for this. 03/10/17.    CURRENT MEDICATIONS:  Outpatient Encounter Medications as of 09/19/2020  Medication Sig  . cholecalciferol (VITAMIN D) 1000 units tablet Take 1,000 Units by mouth daily.  Marland Kitchen levothyroxine (SYNTHROID) 175 MCG tablet Take 175 mcg by mouth daily.  Marland Kitchen OVER THE COUNTER MEDICATION Gallbladder supplement CBD oil q hs for sleeping  .  Probiotic Product (PROBIOTIC-10 PO) Take 1 capsule by mouth daily.   Marland Kitchen testosterone cypionate (DEPOTESTOSTERONE CYPIONATE) 200 MG/ML injection INJECT 0.6 MLS INTRAMUSCULARLY INTO LATERAL THIGH WEEKLY.  . triamterene-hydrochlorothiazide (DYAZIDE) 37.5-25 MG capsule Take 1 capsule by mouth daily.  . [DISCONTINUED] thyroid (ARMOUR) 65 MG tablet Take 97.5-130 mg by mouth 2 (two) times daily. Pt takes two tablets in the morning (Patient not taking: Reported on 09/19/2020)   No facility-administered encounter medications on file as of 09/19/2020.    Objective:   PHYSICAL EXAMINATION:    VITALS:   Vitals:   09/19/20 1114  BP: (!) 149/81  Pulse: 70  SpO2: 97%  Weight: 197 lb (89.4 kg)  Height: 6\' 1"  (1.854 m)    GEN:  The patient appears stated age and is in NAD. HEENT:  Normocephalic, atraumatic.  The mucous membranes are moist. The superficial temporal arteries are without ropiness or tenderness.  Neurological examination:  Orientation: The patient is alert and oriented x3. Cranial nerves: There is good facial symmetry with min facial hypomimia. The speech is fluent and clear. Soft palate rises symmetrically and there is no tongue deviation. Hearing is intact to conversational tone. Sensation: Sensation is intact to light touch throughout Motor: Strength is at least antigravity x4.  Movement examination: Tone: There is nl tone in the UE/LE Abnormal movements: there is rare LUE rest tremor Coordination:  There is min decremation with RAM's, with finger taps on the L Gait and Station: The patient has no  difficulty arising out of a deep-seated chair without the use of the hands. The patient's stride length is good with just slight decrease arm swing on the L.    I have reviewed and interpreted the following labs independently    Chemistry      Component Value Date/Time   NA 137 07/03/2020 2017   K 3.8 07/03/2020 2017   CL 98 07/03/2020 2017   CO2 27 07/03/2020 2017   BUN 15  07/03/2020 2017   CREATININE 0.93 07/03/2020 2017      Component Value Date/Time   CALCIUM 9.4 07/03/2020 2017   ALKPHOS 224 (H) 03/11/2017 0550   AST 415 (H) 03/11/2017 0550   ALT 729 (H) 03/11/2017 0550   BILITOT 5.8 (H) 03/11/2017 0550       Lab Results  Component Value Date   WBC 7.1 07/03/2020   HGB 17.9 (H) 07/03/2020   HCT 54.8 (H) 07/03/2020   MCV 95.5 07/03/2020   PLT 255 07/03/2020    Lab Results  Component Value Date   TSH 0.546 04/02/2016     Total time spent on today's visit was 20 minutes, including both face-to-face time and nonface-to-face time.  Time included that spent on review of records (prior notes available to me/labs/imaging if pertinent), discussing treatment and goals, answering patient's questions and coordinating care.  Cc:  [name], MD

## 2020-09-19 ENCOUNTER — Ambulatory Visit: Payer: Medicare Other | Admitting: Neurology

## 2020-09-19 ENCOUNTER — Other Ambulatory Visit: Payer: Self-pay

## 2020-09-19 ENCOUNTER — Encounter: Payer: Self-pay | Admitting: Neurology

## 2020-09-19 VITALS — BP 149/81 | HR 70 | Ht 73.0 in | Wt 197.0 lb

## 2020-09-19 DIAGNOSIS — G20 Parkinson's disease: Secondary | ICD-10-CM

## 2020-09-19 NOTE — Patient Instructions (Signed)
Parkinson's Disease Parkinson's disease causes problems with movements. It is a long-term condition. It gets worse over time (is progressive). It affects each person in different ways. It makes it harder for you to:  Control how your body moves.  Move your body normally. The condition can range from mild to very bad (advanced). What are the causes? This condition results from a loss of brain cells called neurons. These brain cells make a chemical called dopamine, which is needed to control body movement. As the condition gets worse, the brain cells make less dopamine. This makes it hard to move or control your movements. The exact cause of this condition is not known. What increases the risk?  Being male.  Being age 60 or older.  Having family members who had Parkinson's disease.  Having had an injury to the brain.  Being very sad (depressed).  Being around things that are harmful or poisonous. What are the signs or symptoms? Symptoms of this condition can vary. The main symptoms have to do with movement. These include:  A tremor or shaking while you are resting that you cannot control.  Stiffness in your neck, arms, and legs.  Slowing of movement. This may include: ? Losing expressions of the face. ? Having trouble making small movements that are needed to button your clothing or brush your teeth.  Walking in a way that is not normal. You may walk with short, shuffling steps.  Loss of balance when standing. You may sway, fall backward, or have trouble making turns. Other symptoms include:  Being very sad, worried, or confused.  Seeing or hearing things that are not real.  Losing thinking abilities (dementia).  Trouble speaking or swallowing.  Having a hard time pooping (constipation).  Needing to pee right away, peeing often, or not being able to control when you pee or poop.  Sleep problems. How is this treated? There is no cure. The goal of treatment is to  manage your symptoms. Treatment may include:  Medicines.  Therapy to help with talking or movement.  Surgery to reduce shaking and other movements that you cannot control. Follow these instructions at home: Medicines  Take over-the-counter and prescription medicines only as told by your doctor.  Avoid taking pain or sleeping medicines. Eating and drinking  Follow instructions from your doctor about what you cannot eat or drink.  Do not drink alcohol. Activity  Talk with your doctor about if it is safe for you to drive.  Do exercises as told by your doctor. Lifestyle      Put in grab bars and railings in your home. These help to prevent falls.  Do not use any products that contain nicotine or tobacco, such as cigarettes, e-cigarettes, and chewing tobacco. If you need help quitting, ask your doctor.  Join a support group. General instructions  Talk with your doctor about what you need help with and what your safety needs are.  Keep all follow-up visits as told by your doctor, including any therapy visits to help with talking or moving. This is important. Contact a doctor if:  Medicines do not help your symptoms.  You feel off-balance.  You fall at home.  You need more help at home.  You have trouble swallowing.  You have a very hard time pooping.  You have a lot of side effects from your medicines.  You feel very sad, worried, or confused. Get help right away if:  You were hurt in a fall.  You see   or hear things that are not real.  You cannot swallow without choking.  You have chest pain or trouble breathing.  You do not feel safe at home.  You have thoughts about hurting yourself or others. If you ever feel like you may hurt yourself or others, or have thoughts about taking your own life, get help right away. You can go to your nearest emergency department or call:  Your local emergency services (911 in the [place]  A suicide crisis helpline, such  as the National Suicide Prevention Lifeline at 1-800-273-8255. This is open 24 hours a day. Summary  This condition causes problems with movements.  It is a long-term condition. It gets worse over time.  There is no cure. Treatment focuses on managing your symptoms.  Talk with your doctor about what you need help with and what your safety needs are.  Keep all follow-up visits as told by your doctor. This is important. This information is not intended to replace advice given to you by your health care provider. Make sure you discuss any questions you have with your health care provider. Document Revised: 01/28/2019 Document Reviewed: 01/28/2019 Elsevier Patient Education  2020 [employer]  

## 2020-10-16 ENCOUNTER — Ambulatory Visit: Payer: Medicare Other | Admitting: Cardiovascular Disease

## 2020-10-23 ENCOUNTER — Encounter: Payer: Self-pay | Admitting: General Practice

## 2020-11-27 DIAGNOSIS — N3000 Acute cystitis without hematuria: Secondary | ICD-10-CM | POA: Diagnosis not present

## 2020-12-06 DIAGNOSIS — R12 Heartburn: Secondary | ICD-10-CM | POA: Diagnosis not present

## 2020-12-06 DIAGNOSIS — R10816 Epigastric abdominal tenderness: Secondary | ICD-10-CM | POA: Diagnosis not present

## 2020-12-06 DIAGNOSIS — K219 Gastro-esophageal reflux disease without esophagitis: Secondary | ICD-10-CM | POA: Diagnosis not present

## 2020-12-06 DIAGNOSIS — R142 Eructation: Secondary | ICD-10-CM | POA: Diagnosis not present

## 2020-12-07 DIAGNOSIS — H6123 Impacted cerumen, bilateral: Secondary | ICD-10-CM | POA: Diagnosis not present

## 2020-12-13 DIAGNOSIS — R509 Fever, unspecified: Secondary | ICD-10-CM | POA: Diagnosis not present

## 2020-12-20 ENCOUNTER — Other Ambulatory Visit: Payer: Self-pay | Admitting: Family Medicine

## 2020-12-20 DIAGNOSIS — M79661 Pain in right lower leg: Secondary | ICD-10-CM

## 2020-12-21 ENCOUNTER — Ambulatory Visit (INDEPENDENT_AMBULATORY_CARE_PROVIDER_SITE_OTHER): Payer: Medicare Other

## 2020-12-21 ENCOUNTER — Other Ambulatory Visit: Payer: Self-pay

## 2020-12-21 DIAGNOSIS — M79661 Pain in right lower leg: Secondary | ICD-10-CM | POA: Diagnosis not present

## 2020-12-23 DIAGNOSIS — G4733 Obstructive sleep apnea (adult) (pediatric): Secondary | ICD-10-CM | POA: Diagnosis not present

## 2020-12-25 DIAGNOSIS — M1990 Unspecified osteoarthritis, unspecified site: Secondary | ICD-10-CM | POA: Diagnosis not present

## 2020-12-25 DIAGNOSIS — M25519 Pain in unspecified shoulder: Secondary | ICD-10-CM | POA: Diagnosis not present

## 2020-12-25 DIAGNOSIS — M2550 Pain in unspecified joint: Secondary | ICD-10-CM | POA: Diagnosis not present

## 2020-12-25 DIAGNOSIS — M791 Myalgia, unspecified site: Secondary | ICD-10-CM | POA: Diagnosis not present

## 2020-12-25 DIAGNOSIS — R5383 Other fatigue: Secondary | ICD-10-CM | POA: Diagnosis not present

## 2020-12-25 DIAGNOSIS — M25559 Pain in unspecified hip: Secondary | ICD-10-CM | POA: Diagnosis not present

## 2021-02-07 DIAGNOSIS — Z79899 Other long term (current) drug therapy: Secondary | ICD-10-CM | POA: Diagnosis not present

## 2021-02-07 DIAGNOSIS — M2550 Pain in unspecified joint: Secondary | ICD-10-CM | POA: Diagnosis not present

## 2021-02-07 DIAGNOSIS — M25559 Pain in unspecified hip: Secondary | ICD-10-CM | POA: Diagnosis not present

## 2021-02-07 DIAGNOSIS — R5383 Other fatigue: Secondary | ICD-10-CM | POA: Diagnosis not present

## 2021-02-07 DIAGNOSIS — M791 Myalgia, unspecified site: Secondary | ICD-10-CM | POA: Diagnosis not present

## 2021-02-07 DIAGNOSIS — M1990 Unspecified osteoarthritis, unspecified site: Secondary | ICD-10-CM | POA: Diagnosis not present

## 2021-02-07 DIAGNOSIS — M25519 Pain in unspecified shoulder: Secondary | ICD-10-CM | POA: Diagnosis not present

## 2021-02-08 DIAGNOSIS — M2550 Pain in unspecified joint: Secondary | ICD-10-CM | POA: Diagnosis not present

## 2021-02-08 DIAGNOSIS — M1990 Unspecified osteoarthritis, unspecified site: Secondary | ICD-10-CM | POA: Diagnosis not present

## 2021-02-13 ENCOUNTER — Other Ambulatory Visit: Payer: Self-pay | Admitting: Physician Assistant

## 2021-02-13 DIAGNOSIS — R1011 Right upper quadrant pain: Secondary | ICD-10-CM | POA: Diagnosis not present

## 2021-02-13 DIAGNOSIS — R7989 Other specified abnormal findings of blood chemistry: Secondary | ICD-10-CM | POA: Diagnosis not present

## 2021-03-01 ENCOUNTER — Telehealth: Payer: Self-pay

## 2021-03-01 ENCOUNTER — Other Ambulatory Visit: Payer: Medicare Other

## 2021-03-01 ENCOUNTER — Other Ambulatory Visit: Payer: Self-pay | Admitting: Physician Assistant

## 2021-03-01 ENCOUNTER — Inpatient Hospital Stay: Admission: RE | Admit: 2021-03-01 | Payer: Medicare Other | Source: Ambulatory Visit

## 2021-03-01 DIAGNOSIS — R109 Unspecified abdominal pain: Secondary | ICD-10-CM

## 2021-03-01 DIAGNOSIS — T883XXA Malignant hyperthermia due to anesthesia, initial encounter: Secondary | ICD-10-CM

## 2021-03-01 MED ORDER — DIPHENHYDRAMINE HCL 50 MG PO CAPS: 50.0000 mg | ORAL_CAPSULE | Freq: Once | ORAL | Status: DC

## 2021-03-01 MED ORDER — PREDNISONE 50 MG PO TABS
ORAL_TABLET | ORAL | 0 refills | Status: DC
Start: 2021-03-01 — End: 2021-03-17

## 2021-03-01 NOTE — Telephone Encounter (Signed)
Phone call to patient to review instructions for 13 hr prep for CT w/ contrast on 03/02/21 at 3:40 PM. Prescription called into [facility]. Pt aware and verbalized understanding of instructions. Prescription: "Pt to take 50 mg of prednisone on 03/02/21 at 2:40 AM, 50 mg of prednisone on 03/02/21 at 8:40 AM, and 50 mg of prednisone on 03/02/21 at 2:40 PM. Pt is also to take 50 mg of benadryl on 03/02/21 at 2:40 PM. Please call [phone] with any questions."  Pt did report he was recently started on 10 mg of prednisone daily by his doctor. I spoke to Dr. Mosetta Putt regarding this and he advised for the patient to skip his 10 mg tablet on 03/02/21 and only take the 13 hr prep that day. I explained this to the patient and he verbalized understanding.

## 2021-03-02 ENCOUNTER — Other Ambulatory Visit: Payer: Self-pay

## 2021-03-02 ENCOUNTER — Encounter (HOSPITAL_BASED_OUTPATIENT_CLINIC_OR_DEPARTMENT_OTHER): Payer: Self-pay | Admitting: Emergency Medicine

## 2021-03-02 ENCOUNTER — Emergency Department (HOSPITAL_BASED_OUTPATIENT_CLINIC_OR_DEPARTMENT_OTHER)
Admission: EM | Admit: 2021-03-02 | Discharge: 2021-03-02 | Disposition: A | Discharge status: Home / Self Care | Payer: Medicare Other | Source: Home / Self Care | Attending: Emergency Medicine | Admitting: Emergency Medicine

## 2021-03-02 ENCOUNTER — Inpatient Hospital Stay: Admission: RE | Admit: 2021-03-02 | Payer: Medicare Other | Source: Ambulatory Visit

## 2021-03-02 ENCOUNTER — Emergency Department (HOSPITAL_BASED_OUTPATIENT_CLINIC_OR_DEPARTMENT_OTHER): Payer: Medicare Other

## 2021-03-02 DIAGNOSIS — I10 Essential (primary) hypertension: Secondary | ICD-10-CM | POA: Insufficient documentation

## 2021-03-02 DIAGNOSIS — E039 Hypothyroidism, unspecified: Secondary | ICD-10-CM | POA: Insufficient documentation

## 2021-03-02 DIAGNOSIS — Z87891 Personal history of nicotine dependence: Secondary | ICD-10-CM | POA: Insufficient documentation

## 2021-03-02 DIAGNOSIS — Z20822 Contact with and (suspected) exposure to covid-19: Secondary | ICD-10-CM | POA: Insufficient documentation

## 2021-03-02 DIAGNOSIS — Z79899 Other long term (current) drug therapy: Secondary | ICD-10-CM | POA: Insufficient documentation

## 2021-03-02 DIAGNOSIS — J181 Lobar pneumonia, unspecified organism: Secondary | ICD-10-CM | POA: Insufficient documentation

## 2021-03-02 DIAGNOSIS — J189 Pneumonia, unspecified organism: Secondary | ICD-10-CM

## 2021-03-02 DIAGNOSIS — A419 Sepsis, unspecified organism: Secondary | ICD-10-CM | POA: Diagnosis not present

## 2021-03-02 DIAGNOSIS — A4181 Sepsis due to Enterococcus: Secondary | ICD-10-CM | POA: Diagnosis not present

## 2021-03-02 LAB — TROPONIN I (HIGH SENSITIVITY)
Troponin I (High Sensitivity): 54 ng/L — ABNORMAL HIGH (ref <18)
Troponin I (High Sensitivity): 55 ng/L — ABNORMAL HIGH (ref <18)

## 2021-03-02 LAB — COMPREHENSIVE METABOLIC PANEL
ALT: 85 U/L — ABNORMAL HIGH (ref 0–44)
AST: 55 U/L — ABNORMAL HIGH (ref 15–41)
Albumin: 3 g/dL — ABNORMAL LOW (ref 3.5–5.0)
Alkaline Phosphatase: 243 U/L — ABNORMAL HIGH (ref 38–126)
Anion gap: 11 (ref 5–15)
BUN: 21 mg/dL (ref 8–23)
CO2: 24 mmol/L (ref 22–32)
Calcium: 8.7 mg/dL — ABNORMAL LOW (ref 8.9–10.3)
Chloride: 98 mmol/L (ref 98–111)
Creatinine, Ser: 1.07 mg/dL (ref 0.61–1.24)
GFR, Estimated: >60 mL/min (ref >60)
Glucose, Bld: 186 mg/dL — ABNORMAL HIGH (ref 70–99)
Potassium: 3.7 mmol/L (ref 3.5–5.1)
Sodium: 133 mmol/L — ABNORMAL LOW (ref 135–145)
Total Bilirubin: 0.9 mg/dL (ref 0.3–1.2)
Total Protein: 7.5 g/dL (ref 6.5–8.1)

## 2021-03-02 LAB — CBC WITH DIFFERENTIAL/PLATELET
Abs Immature Granulocytes: 0.11 10*3/uL — ABNORMAL HIGH (ref 0.00–0.07)
Basophils Absolute: 0 10*3/uL (ref 0.0–0.1)
Basophils Relative: 0 %
Eosinophils Absolute: 0 10*3/uL (ref 0.0–0.5)
Eosinophils Relative: 0 %
HCT: 36.2 % — ABNORMAL LOW (ref 39.0–52.0)
Hemoglobin: 11.7 g/dL — ABNORMAL LOW (ref 13.0–17.0)
Immature Granulocytes: 1 %
Lymphocytes Relative: 5 %
Lymphs Abs: 0.7 10*3/uL (ref 0.7–4.0)
MCH: 29.9 pg (ref 26.0–34.0)
MCHC: 32.3 g/dL (ref 30.0–36.0)
MCV: 92.6 fL (ref 80.0–100.0)
Monocytes Absolute: 0.2 10*3/uL (ref 0.1–1.0)
Monocytes Relative: 2 %
Neutro Abs: 13 10*3/uL — ABNORMAL HIGH (ref 1.7–7.7)
Neutrophils Relative %: 92 %
Platelets: 314 10*3/uL (ref 150–400)
RBC: 3.91 MIL/uL — ABNORMAL LOW (ref 4.22–5.81)
RDW: 13.6 % (ref 11.5–15.5)
WBC: 14.1 10*3/uL — ABNORMAL HIGH (ref 4.0–10.5)
nRBC: 0 % (ref 0.0–0.2)

## 2021-03-02 LAB — CK: Total CK: 14 U/L — ABNORMAL LOW (ref 49–397)

## 2021-03-02 LAB — LACTIC ACID, PLASMA: Lactic Acid, Venous: 1.8 mmol/L (ref 0.5–1.9)

## 2021-03-02 LAB — D-DIMER, QUANTITATIVE: D-Dimer, Quant: 0.72 ug/mL-FEU — ABNORMAL HIGH (ref 0.00–0.50)

## 2021-03-02 LAB — PROTIME-INR
INR: 1.1 (ref 0.8–1.2)
Prothrombin Time: 14.1 seconds (ref 11.4–15.2)

## 2021-03-02 LAB — BRAIN NATRIURETIC PEPTIDE: B Natriuretic Peptide: 175.4 pg/mL — ABNORMAL HIGH (ref 0.0–100.0)

## 2021-03-02 LAB — RESP PANEL BY RT-PCR (FLU A&B, COVID) ARPGX2
Influenza A by PCR: NEGATIVE
Influenza B by PCR: NEGATIVE
SARS Coronavirus 2 by RT PCR: NEGATIVE

## 2021-03-02 LAB — SEDIMENTATION RATE: Sed Rate: 129 mm/hr — ABNORMAL HIGH (ref 0–16)

## 2021-03-02 LAB — LIPASE, BLOOD: Lipase: 27 U/L (ref 11–51)

## 2021-03-02 MED ORDER — PREDNISONE 50 MG PO TABS
50.0000 mg | ORAL_TABLET | Freq: Once | ORAL | Status: AC
  Administered 2021-03-02: 50 mg via ORAL
  Filled 2021-03-02: qty 1

## 2021-03-02 MED ORDER — AMOXICILLIN-POT CLAVULANATE 875-125 MG PO TABS
1.0000 | ORAL_TABLET | Freq: Once | ORAL | Status: AC
  Administered 2021-03-02: 1 via ORAL
  Filled 2021-03-02: qty 1

## 2021-03-02 MED ORDER — DIPHENHYDRAMINE HCL 25 MG PO CAPS
50.0000 mg | ORAL_CAPSULE | Freq: Once | ORAL | Status: AC
  Administered 2021-03-02: 50 mg via ORAL
  Filled 2021-03-02: qty 2

## 2021-03-02 MED ORDER — IOHEXOL 350 MG/ML SOLN
100.0000 mL | Freq: Once | INTRAVENOUS | Status: AC | PRN
  Administered 2021-03-02: 100 mL via INTRAVENOUS

## 2021-03-02 MED ORDER — AZITHROMYCIN 250 MG PO TABS: 250.0000 mg | ORAL_TABLET | Freq: Every day | ORAL | 0 refills | Status: DC

## 2021-03-02 MED ORDER — AMOXICILLIN-POT CLAVULANATE 875-125 MG PO TABS: 1.0000 | ORAL_TABLET | Freq: Two times a day (BID) | ORAL | 0 refills | Status: DC

## 2021-03-02 MED ORDER — AZITHROMYCIN 250 MG PO TABS
500.0000 mg | ORAL_TABLET | Freq: Once | ORAL | Status: AC
  Administered 2021-03-02: 500 mg via ORAL
  Filled 2021-03-02: qty 2

## 2021-03-02 NOTE — ED Triage Notes (Signed)
Pt sent from [facility] physicians office for "lung issues", RUQ abdominal pain.  Pt admits to fevers at home, aches and pains in his legs, DOE, cough x one week, some nausea with decreased appetite and weight loss, and current prednisone use for leg aching.    Pt had covid in august.

## 2021-03-02 NOTE — ED Notes (Signed)
Pt c/o fever, inflammation that travels throughout the body- legs feet - stays for a few days and then travels to a different location. Dry cough, and overall body weakness  Denies CP, ABD pain, headache, N/V/D Pt has call bell, warm blanket, TV remote and ED process explained

## 2021-03-02 NOTE — ED Notes (Signed)
Patient to CT scan

## 2021-03-02 NOTE — ED Provider Notes (Signed)
Patient checked out at 330 by Dr. Charm Barges.  Patient here with 1 week of worsening fever, cough, shortness of breath.  Patient has had a multitude of issues since having Covid last year and has been following up with his natural doctor but still does not have a lot of answers.  He was sent from [facility] today as he has been having some ongoing abdominal issues as well.  Patient's CT shows no evidence of PE but does show a right upper lobe pneumonia.  CT of the abdomen without acute findings.  Patient's troponins are flat and he denies any chest pain.  EKG without acute findings.  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  Leukocytosis today of 14,000 but sats are 100% on room air.  Patient is sitting in the bed breathing 19 times a minute and able to speak in full sentences.  Lactic acid is within normal limits.  Troponins have been flat at 54 and 55.  LFTs are improved from prior.  Discussed hospitalization and IV antibiotics with the patient but he prefers to go home.  He was ambulated and never dropped below 98% on room air and was able to speak in full sentences.  Will cover for atypical and strep pneumo species with Augmentin and azithromycin.  Gave patient strict return precautions.   Gwyneth Sprout, MD 03/02/21 [phone]

## 2021-03-02 NOTE — ED Notes (Signed)
2nd blood Cx obtained and sent to lab along with COVID and trop  Pt has call bell and lights turned down for pt comfort

## 2021-03-02 NOTE — ED Notes (Signed)
Patient ambulated in room with pulse ox. Able to carry on conversation while talking. SAT 98-99%, HR 104. MD aware. No SOB noted

## 2021-03-02 NOTE — ED Provider Notes (Signed)
[facility] EMERGENCY DEPARTMENT Provider Note   CSN: [id number] Arrival date & time: 03/02/21  1040     History Chief Complaint  Patient presents with  . Fever    Brandon Buttehomas E [name] is a 70 y.o. male.  He said he has not been right since he contracted Covid last fall.  He has been having migrating muscle pains and fatigue.  He just saw a rheumatology who told him his inflammation number was high and started him on some prednisone.  More recently he has had 1 week of shortness of breath dyspnea on exertion and nonproductive cough.  He [facility] and they started him on a prednisone prep to get a CAT scan.  He has had 2 oral doses of prednisone is due for his next dose at 2 40 p.m. today.  He said they sent him here for more of a work-up.  No chest pain.  No orthopnea.  Intermittent fevers.  The history is provided by the patient.  Shortness of Breath Severity:  Moderate Onset quality:  Gradual Duration:  1 week Timing:  Intermittent Progression:  Unchanged Chronicity:  New Context: activity   Relieved by:  Rest Worsened by:  Activity Ineffective treatments:  None tried Associated symptoms: cough and fever   Associated symptoms: no chest pain, no headaches, no hemoptysis, no rash, no sore throat, no sputum production, no syncope, no vomiting and no wheezing        Past Medical History:  Diagnosis Date  . Atrial fibrillation (HCC)   . BPH (benign prostatic hyperplasia)   . Cervical spine disease   . Chest pain 2007   none since  . Gallbladder disorder   . Hemopneumothorax on left   . Hiatal hernia   . Hypercholesteremia   . Hyperlipidemia   . Meniere disease   . Meniere's disease   . PONV (postoperative nausea and vomiting)    also has trouble waking up    Patient Active Problem List   Diagnosis Date Noted  . Essential hypertension 03/11/2017  . Hypothyroidism 03/10/2017  . Abnormal liver function 03/10/2017  . Allergic reaction 03/10/2017  .  Abnormal LFTs 03/10/2017  . Blunt chest trauma 04/02/2016  . Acute urinary retention 04/02/2016  . Acute blood loss anemia 04/02/2016  . Fracture of left clavicle 03/30/2016  . Multiple fractures of ribs of left side 03/30/2016  . Left pulmonary contusion 03/30/2016  . Hemopneumothorax, left 03/30/2016  . Hiatal hernia   . Gallbladder disorder   . Hypercholesteremia   . Chest pain 08/06/2012  . Elevated BP 08/06/2012    Past Surgical History:  Procedure Laterality Date  . CARDIAC CATHETERIZATION    . COLONOSCOPY    . ORIF CLAVICULAR FRACTURE Left 04/18/2016   Procedure: OPEN REDUCTION INTERNAL FIXATION (ORIF) LEFT CLAVICLE FRACTURE;  Surgeon: Francena HanlyKevin Supple, MD;  Location: [facility] OR;  Service: Orthopedics;  Laterality: Left;  . THYROIDECTOMY    . TONSILLECTOMY         Family History  Problem Relation Age of Onset  . Hypertension Mother   . Breast cancer Mother   . Heart disease Father   . Emphysema Father   . Heart attack Sister   . Heart attack Brother     Social History   Tobacco Use  . Smoking status: Former Smoker    Quit date: 04/18/1979    Years since quitting: 41.9  . Smokeless tobacco: Never Used  Vaping Use  . Vaping Use: Never used  Substance Use Topics  . Alcohol use: Yes    Comment: 3 timesa a week (wine or beer)  . Drug use: No    Home Medications Prior to Admission medications   Medication Sig Start Date End Date Taking? Authorizing Provider  cholecalciferol (VITAMIN D) 1000 units tablet Take 1,000 Units by mouth daily.    [provider]  levothyroxine (SYNTHROID) 175 MCG tablet Take 175 mcg by mouth daily. 09/14/20   [provider]  OVER THE COUNTER MEDICATION Gallbladder supplement CBD oil q hs for sleeping    [provider]  predniSONE (DELTASONE) 50 MG tablet Pt to take 50 mg of prednisone on 03/02/21 at 2:40 AM, 50 mg of prednisone on 03/02/21 at 8:40 AM, and 50 mg of prednisone on 03/02/21 at 2:40 PM. Pt is also to take 50  mg of benadryl on 03/02/21 at 2:40 PM. Please call [phone] with any questions. 03/01/21   Sebastian Ache, MD  Probiotic Product (PROBIOTIC-10 PO) Take 1 capsule by mouth daily.     [provider]  testosterone cypionate (DEPOTESTOSTERONE CYPIONATE) 200 MG/ML injection INJECT 0.6 MLS INTRAMUSCULARLY INTO LATERAL THIGH WEEKLY. 08/10/19   [provider]  triamterene-hydrochlorothiazide (DYAZIDE) 37.5-25 MG capsule Take 1 capsule by mouth daily.    [provider]    Allergies    Ceftin [cefuroxime axetil], Other, Oxycodone, and Iohexol  Review of Systems   Review of Systems  Constitutional: Positive for fatigue and fever.  HENT: Negative for sore throat.   Eyes: Negative for visual disturbance.  Respiratory: Positive for cough and shortness of breath. Negative for hemoptysis, sputum production and wheezing.   Cardiovascular: Negative for chest pain and syncope.  Gastrointestinal: Positive for nausea. Negative for vomiting.  Genitourinary: Negative for dysuria.  Musculoskeletal: Positive for myalgias.  Skin: Negative for rash.  Neurological: Negative for headaches.    Physical Exam Updated Vital Signs BP 138/72   Pulse 93   Temp 97.7 F (36.5 C) (Oral)   Resp 20   Ht 6\' 1"  (1.854 m)   Wt 86.4 kg   SpO2 100%   BMI 25.12 kg/m   Physical Exam Vitals and nursing note reviewed.  Constitutional:      Appearance: Normal appearance. He is well-developed.  HENT:     Head: Normocephalic and atraumatic.  Eyes:     Conjunctiva/sclera: Conjunctivae normal.  Cardiovascular:     Rate and Rhythm: Normal rate and regular rhythm.     Heart sounds: Murmur (he says 15 years at least) heard.    Pulmonary:     Effort: Pulmonary effort is normal. No respiratory distress.     Breath sounds: Normal breath sounds.  Abdominal:     Palpations: Abdomen is soft.     Tenderness: There is no abdominal tenderness. There is no guarding or rebound.  Musculoskeletal:         General: No deformity or signs of injury. Normal range of motion.     Cervical back: Neck supple.     Right lower leg: No edema.     Left lower leg: No edema.  Skin:    General: Skin is warm and dry.     Capillary Refill: Capillary refill takes less than 2 seconds.  Neurological:     General: No focal deficit present.     Mental Status: He is alert.     ED Results / Procedures / Treatments   Labs (all labs ordered are listed, but only abnormal results are displayed)  Labs Reviewed  COMPREHENSIVE METABOLIC PANEL - Abnormal; Notable for the following components:      Result Value   Sodium 133 (*)    Glucose, Bld 186 (*)    Calcium 8.7 (*)    Albumin 3.0 (*)    AST 55 (*)    ALT 85 (*)    Alkaline Phosphatase 243 (*)    All other components within normal limits  CBC WITH DIFFERENTIAL/PLATELET - Abnormal; Notable for the following components:   WBC 14.1 (*)    RBC 3.91 (*)    Hemoglobin 11.7 (*)    HCT 36.2 (*)    Neutro Abs 13.0 (*)    Abs Immature Granulocytes 0.11 (*)    All other components within normal limits  BRAIN NATRIURETIC PEPTIDE - Abnormal; Notable for the following components:   B Natriuretic Peptide 175.4 (*)    All other components within normal limits  SEDIMENTATION RATE - Abnormal; Notable for the following components:   Sed Rate 129 (*)    All other components within normal limits  D-DIMER, QUANTITATIVE - Abnormal; Notable for the following components:   D-Dimer, Quant 0.72 (*)    All other components within normal limits  CK - Abnormal; Notable for the following components:   Total CK 14 (*)    All other components within normal limits  TROPONIN I (HIGH SENSITIVITY) - Abnormal; Notable for the following components:   Troponin I (High Sensitivity) 54 (*)    All other components within normal limits  TROPONIN I (HIGH SENSITIVITY) - Abnormal; Notable for the following components:   Troponin I (High Sensitivity) 55 (*)    All other components within  normal limits  RESP PANEL BY RT-PCR (FLU A&B, COVID) ARPGX2  CULTURE, BLOOD (ROUTINE X 2)  CULTURE, BLOOD (ROUTINE X 2)  LIPASE, BLOOD  LACTIC ACID, PLASMA  PROTIME-INR    EKG EKG Interpretation  Date/Time:  Friday March 02 2021 11:57:48 EDT Ventricular Rate:  87 PR Interval:  168 QRS Duration: 95 QT Interval:  379 QTC Calculation: 456 R Axis:   51 Text Interpretation: Sinus rhythm LAE, consider biatrial enlargement ST elevation, consider inferior injury No significant change since prior 8/21 Confirmed by Meridee Score [id number]) on 03/02/2021 12:03:46 PM   Radiology CT Angio Chest PE W/Cm &/Or Wo Cm  Result Date: 03/02/2021 CLINICAL DATA:  Fever, abdominal pain. EXAM: CT ANGIOGRAPHY CHEST CT ABDOMEN AND PELVIS WITH CONTRAST TECHNIQUE: Multidetector CT imaging of the chest was performed using the standard protocol during bolus administration of intravenous contrast. Multiplanar CT image reconstructions and MIPs were obtained to evaluate the vascular anatomy. Multidetector CT imaging of the abdomen and pelvis was performed using the standard protocol during bolus administration of intravenous contrast. Unfortunately, there is motion artifact during the abdominal scan, limiting these images. CONTRAST:  OMNIPAQUE IOHEXOL 350 MG/ML SOLN COMPARISON:  March 10, 2017. FINDINGS: CTA CHEST FINDINGS Cardiovascular: Satisfactory opacification of the pulmonary arteries to the segmental level. No evidence of pulmonary embolism. Normal heart size. No pericardial effusion. Mediastinum/Nodes: Status post thyroidectomy. The esophagus is unremarkable. No adenopathy is noted. Lungs/Pleura: No pneumothorax or pleural effusion is noted. Right upper lobe airspace opacity is noted concerning for pneumonia. Musculoskeletal: No chest wall abnormality. No acute or significant osseous findings. Review of the MIP images confirms the above findings. CT ABDOMEN and PELVIS FINDINGS Hepatobiliary: No focal liver  abnormality is seen. No gallstones, gallbladder wall thickening, or biliary dilatation. Pancreas: Unremarkable. No pancreatic ductal dilatation or surrounding inflammatory  changes. Spleen: Normal in size without focal abnormality. Adrenals/Urinary Tract: Adrenal glands appear normal. Visualized portions of kidneys are unremarkable, although the lower poles are not well visualized due to patient motion artifact. No definite hydronephrosis or renal obstruction is noted. No definite renal or ureteral calculi are noted. Urinary bladder is unremarkable. Stomach/Bowel: Stomach is within normal limits. Appendix appears normal. No evidence of bowel wall thickening, distention, or inflammatory changes. Vascular/Lymphatic: No significant vascular findings are present. No enlarged abdominal or pelvic lymph nodes. Reproductive: Stable mild prostatic enlargement is noted. Other: Small fat containing right inguinal hernia is noted. No ascites is noted. Musculoskeletal: No acute or significant osseous findings. Review of the MIP images confirms the above findings. IMPRESSION: No definite evidence of pulmonary embolus. Probable right upper lobe pneumonia. Limited evaluation of the kidneys is noted due to patient motion artifact. Lower poles are not well visualized and therefore pathology in these areas cannot be excluded. No definite abnormality is seen involving the visualized portions of the kidneys. Small fat containing right inguinal hernia. Stable mild prostatic enlargement. Electronically Signed   By: Lupita Raider M.D.   On: 03/02/2021 16:49   CT Abdomen Pelvis W Contrast  Result Date: 03/02/2021 CLINICAL DATA:  Fever, abdominal pain. EXAM: CT ANGIOGRAPHY CHEST CT ABDOMEN AND PELVIS WITH CONTRAST TECHNIQUE: Multidetector CT imaging of the chest was performed using the standard protocol during bolus administration of intravenous contrast. Multiplanar CT image reconstructions and MIPs were obtained to evaluate the  vascular anatomy. Multidetector CT imaging of the abdomen and pelvis was performed using the standard protocol during bolus administration of intravenous contrast. Unfortunately, there is motion artifact during the abdominal scan, limiting these images. CONTRAST:  OMNIPAQUE IOHEXOL 350 MG/ML SOLN COMPARISON:  March 10, 2017. FINDINGS: CTA CHEST FINDINGS Cardiovascular: Satisfactory opacification of the pulmonary arteries to the segmental level. No evidence of pulmonary embolism. Normal heart size. No pericardial effusion. Mediastinum/Nodes: Status post thyroidectomy. The esophagus is unremarkable. No adenopathy is noted. Lungs/Pleura: No pneumothorax or pleural effusion is noted. Right upper lobe airspace opacity is noted concerning for pneumonia. Musculoskeletal: No chest wall abnormality. No acute or significant osseous findings. Review of the MIP images confirms the above findings. CT ABDOMEN and PELVIS FINDINGS Hepatobiliary: No focal liver abnormality is seen. No gallstones, gallbladder wall thickening, or biliary dilatation. Pancreas: Unremarkable. No pancreatic ductal dilatation or surrounding inflammatory changes. Spleen: Normal in size without focal abnormality. Adrenals/Urinary Tract: Adrenal glands appear normal. Visualized portions of kidneys are unremarkable, although the lower poles are not well visualized due to patient motion artifact. No definite hydronephrosis or renal obstruction is noted. No definite renal or ureteral calculi are noted. Urinary bladder is unremarkable. Stomach/Bowel: Stomach is within normal limits. Appendix appears normal. No evidence of bowel wall thickening, distention, or inflammatory changes. Vascular/Lymphatic: No significant vascular findings are present. No enlarged abdominal or pelvic lymph nodes. Reproductive: Stable mild prostatic enlargement is noted. Other: Small fat containing right inguinal hernia is noted. No ascites is noted. Musculoskeletal: No acute or  significant osseous findings. Review of the MIP images confirms the above findings. IMPRESSION: No definite evidence of pulmonary embolus. Probable right upper lobe pneumonia. Limited evaluation of the kidneys is noted due to patient motion artifact. Lower poles are not well visualized and therefore pathology in these areas cannot be excluded. No definite abnormality is seen involving the visualized portions of the kidneys. Small fat containing right inguinal hernia. Stable mild prostatic enlargement. Electronically Signed   By: Roque Lias  Jr M.D.   On: 03/02/2021 16:49   DG Chest Port 1 View  Result Date: 03/02/2021 CLINICAL DATA:  Shortness of breath EXAM: PORTABLE CHEST 1 VIEW COMPARISON:  July 03, 2020 FINDINGS: There is diffuse interstitial thickening without frank edema or consolidation. Heart size and pulmonary vascularity are normal. No adenopathy. Evidence of prior trauma with postoperative fixation left clavicle. Evidence of old healed fracture anterior right second rib. IMPRESSION: Diffuse interstitial thickening, likely representing a degree of chronic bronchitis. No edema or consolidation. Heart size normal. Electronically Signed   By: Bretta Bang III M.D.   On: 03/02/2021 12:13    Procedures Procedures   Medications Ordered in ED Medications  predniSONE (DELTASONE) tablet 50 mg (50 mg Oral Given 03/02/21 1445)  diphenhydrAMINE (BENADRYL) capsule 50 mg (50 mg Oral Given 03/02/21 1446)  iohexol (OMNIPAQUE) 350 MG/ML injection 100 mL (100 mLs Intravenous Contrast Given 03/02/21 1524)  amoxicillin-clavulanate (AUGMENTIN) 875-125 MG per tablet 1 tablet (1 tablet Oral Given 03/02/21 1737)  azithromycin (ZITHROMAX) tablet 500 mg (500 mg Oral Given 03/02/21 1737)    ED Course  I have reviewed the triage vital signs and the nursing notes.  Pertinent labs & imaging results that were available during my care of the patient were reviewed by me and considered in my medical decision making (see  chart for details).  Clinical Course as of 03/02/21 1906  Fri Mar 02, 2021  1139 Talk with PA Steffanie Dunn and [facility] and she reviewed her thoughts on seeing him today and we agree that he is probably getting need to CT angio chest and CT abdomen and pelvis. [MB]  1217 Chest x-ray showing probable interstitial lung disease, no acute infiltrates interpreted by me. [MB]  1359 White count is elevated but is on prednisone.  LFTs abnormal but in comparison to priors do not seem significantly changed.  Troponin elevated and will need to be trended.  D-dimer elevated unclear significance.  BNP mildly elevated.  Sed rate markedly elevated. [MB]    Clinical Course User Index [MB] Terrilee Files, MD   MDM Rules/Calculators/A&P                         This patient complains of shortness of breath dyspnea on exertion and more longstanding fatigue and myalgias; this involves an extensive number of treatment Options and is a complaint that carries with it a high risk of complications and Morbidity. The differential includes long COVID syndrome, pneumonia, PE CHF, ACS  I ordered, reviewed and interpreted labs, which included CBC with elevated white count, could be infectious could be reactive due to prednisone, hemoglobin low and current active problem being followed for GI, chemistries fairly normal other than elevated LFTs which have been seen prior, CPK not elevated, lactate normal, troponins elevated but flat, Covid testing negative, sed rate markedly elevated I ordered medication prednisone and Benadryl for contrast allergy premedication I ordered imaging studies which included chest x-ray and CT angio chest and I independently    visualized and interpreted imaging which showed interstitial lung disease.  CT is pending at time of signout. Additional history obtained from patient's GI provider Previous records obtained and reviewed in epic including prior Covid admission  After the  interventions stated above, I reevaluated the patient and found patient to be minimally symptomatic at rest.  His care is signed out to oncoming provider Dr. Anitra Lauth to follow-up on results of CT.  Possible discharge if find treatable cause  of his dyspnea.  Otherwise may need admission.   Final Clinical Impression(s) / ED Diagnoses Final diagnoses:  Community acquired pneumonia of right upper lobe of lung    Rx / DC Orders ED Discharge Orders    None       Terrilee Files, MD 03/02/21 1910

## 2021-03-02 NOTE — ED Notes (Signed)
Ice chips OK per MD Charm Barges Pt given urinal

## 2021-03-03 ENCOUNTER — Emergency Department (HOSPITAL_COMMUNITY): Payer: Medicare Other

## 2021-03-03 ENCOUNTER — Other Ambulatory Visit: Payer: Self-pay

## 2021-03-03 ENCOUNTER — Inpatient Hospital Stay (HOSPITAL_COMMUNITY)
Admission: EM | Admit: 2021-03-03 | Discharge: 2021-03-17 | DRG: 871 | Payer: Medicare Other | Attending: Internal Medicine | Admitting: Internal Medicine

## 2021-03-03 ENCOUNTER — Encounter (HOSPITAL_COMMUNITY): Payer: Self-pay

## 2021-03-03 DIAGNOSIS — A4181 Sepsis due to Enterococcus: Secondary | ICD-10-CM | POA: Diagnosis present

## 2021-03-03 DIAGNOSIS — R5383 Other fatigue: Secondary | ICD-10-CM | POA: Diagnosis not present

## 2021-03-03 DIAGNOSIS — I482 Chronic atrial fibrillation, unspecified: Secondary | ICD-10-CM | POA: Diagnosis not present

## 2021-03-03 DIAGNOSIS — I330 Acute and subacute infective endocarditis: Secondary | ICD-10-CM

## 2021-03-03 DIAGNOSIS — E876 Hypokalemia: Secondary | ICD-10-CM | POA: Diagnosis present

## 2021-03-03 DIAGNOSIS — J156 Pneumonia due to other aerobic Gram-negative bacteria: Secondary | ICD-10-CM | POA: Diagnosis present

## 2021-03-03 DIAGNOSIS — J90 Pleural effusion, not elsewhere classified: Secondary | ICD-10-CM | POA: Diagnosis not present

## 2021-03-03 DIAGNOSIS — A021 Salmonella sepsis: Secondary | ICD-10-CM | POA: Diagnosis not present

## 2021-03-03 DIAGNOSIS — Z803 Family history of malignant neoplasm of breast: Secondary | ICD-10-CM

## 2021-03-03 DIAGNOSIS — R748 Abnormal levels of other serum enzymes: Secondary | ICD-10-CM | POA: Diagnosis not present

## 2021-03-03 DIAGNOSIS — Z881 Allergy status to other antibiotic agents status: Secondary | ICD-10-CM

## 2021-03-03 DIAGNOSIS — J9601 Acute respiratory failure with hypoxia: Secondary | ICD-10-CM | POA: Diagnosis present

## 2021-03-03 DIAGNOSIS — I059 Rheumatic mitral valve disease, unspecified: Secondary | ICD-10-CM | POA: Diagnosis present

## 2021-03-03 DIAGNOSIS — I38 Endocarditis, valve unspecified: Secondary | ICD-10-CM

## 2021-03-03 DIAGNOSIS — Z885 Allergy status to narcotic agent status: Secondary | ICD-10-CM | POA: Diagnosis not present

## 2021-03-03 DIAGNOSIS — I480 Paroxysmal atrial fibrillation: Secondary | ICD-10-CM | POA: Diagnosis not present

## 2021-03-03 DIAGNOSIS — Z87891 Personal history of nicotine dependence: Secondary | ICD-10-CM | POA: Diagnosis not present

## 2021-03-03 DIAGNOSIS — I4891 Unspecified atrial fibrillation: Secondary | ICD-10-CM | POA: Diagnosis present

## 2021-03-03 DIAGNOSIS — N401 Enlarged prostate with lower urinary tract symptoms: Secondary | ICD-10-CM | POA: Diagnosis not present

## 2021-03-03 DIAGNOSIS — M791 Myalgia, unspecified site: Secondary | ICD-10-CM | POA: Diagnosis present

## 2021-03-03 DIAGNOSIS — J189 Pneumonia, unspecified organism: Secondary | ICD-10-CM | POA: Diagnosis present

## 2021-03-03 DIAGNOSIS — I2510 Atherosclerotic heart disease of native coronary artery without angina pectoris: Secondary | ICD-10-CM | POA: Diagnosis not present

## 2021-03-03 DIAGNOSIS — Z79890 Hormone replacement therapy (postmenopausal): Secondary | ICD-10-CM

## 2021-03-03 DIAGNOSIS — I5032 Chronic diastolic (congestive) heart failure: Secondary | ICD-10-CM | POA: Diagnosis present

## 2021-03-03 DIAGNOSIS — Z8616 Personal history of COVID-19: Secondary | ICD-10-CM

## 2021-03-03 DIAGNOSIS — Z8249 Family history of ischemic heart disease and other diseases of the circulatory system: Secondary | ICD-10-CM | POA: Diagnosis not present

## 2021-03-03 DIAGNOSIS — D649 Anemia, unspecified: Secondary | ICD-10-CM | POA: Diagnosis present

## 2021-03-03 DIAGNOSIS — I110 Hypertensive heart disease with heart failure: Secondary | ICD-10-CM | POA: Diagnosis present

## 2021-03-03 DIAGNOSIS — J1282 Pneumonia due to coronavirus disease 2019: Secondary | ICD-10-CM | POA: Diagnosis present

## 2021-03-03 DIAGNOSIS — R000 Tachycardia, unspecified: Secondary | ICD-10-CM | POA: Diagnosis present

## 2021-03-03 DIAGNOSIS — Z79899 Other long term (current) drug therapy: Secondary | ICD-10-CM | POA: Diagnosis not present

## 2021-03-03 DIAGNOSIS — Z9889 Other specified postprocedural states: Secondary | ICD-10-CM

## 2021-03-03 DIAGNOSIS — E890 Postprocedural hypothyroidism: Secondary | ICD-10-CM | POA: Diagnosis present

## 2021-03-03 DIAGNOSIS — I340 Nonrheumatic mitral (valve) insufficiency: Secondary | ICD-10-CM | POA: Diagnosis not present

## 2021-03-03 DIAGNOSIS — A419 Sepsis, unspecified organism: Secondary | ICD-10-CM | POA: Diagnosis present

## 2021-03-03 DIAGNOSIS — N400 Enlarged prostate without lower urinary tract symptoms: Secondary | ICD-10-CM | POA: Diagnosis present

## 2021-03-03 DIAGNOSIS — R6520 Severe sepsis without septic shock: Secondary | ICD-10-CM | POA: Diagnosis not present

## 2021-03-03 DIAGNOSIS — E785 Hyperlipidemia, unspecified: Secondary | ICD-10-CM | POA: Diagnosis present

## 2021-03-03 DIAGNOSIS — B952 Enterococcus as the cause of diseases classified elsewhere: Secondary | ICD-10-CM

## 2021-03-03 DIAGNOSIS — R600 Localized edema: Secondary | ICD-10-CM | POA: Diagnosis not present

## 2021-03-03 DIAGNOSIS — I339 Acute and subacute endocarditis, unspecified: Secondary | ICD-10-CM | POA: Diagnosis not present

## 2021-03-03 DIAGNOSIS — R7881 Bacteremia: Secondary | ICD-10-CM | POA: Diagnosis not present

## 2021-03-03 DIAGNOSIS — U071 COVID-19: Secondary | ICD-10-CM | POA: Diagnosis present

## 2021-03-03 LAB — URINALYSIS, ROUTINE W REFLEX MICROSCOPIC
Bilirubin Urine: NEGATIVE
Glucose, UA: NEGATIVE mg/dL
Hgb urine dipstick: NEGATIVE
Ketones, ur: NEGATIVE mg/dL
Leukocytes,Ua: NEGATIVE
Nitrite: NEGATIVE
Protein, ur: NEGATIVE mg/dL
Specific Gravity, Urine: 1.024 (ref 1.005–1.030)
pH: 7 (ref 5.0–8.0)

## 2021-03-03 LAB — BLOOD CULTURE ID PANEL (REFLEXED) - BCID2

## 2021-03-03 LAB — CBC WITH DIFFERENTIAL/PLATELET
Abs Immature Granulocytes: 0.17 10*3/uL — ABNORMAL HIGH (ref 0.00–0.07)
Basophils Absolute: 0 10*3/uL (ref 0.0–0.1)
Basophils Relative: 0 %
Eosinophils Absolute: 0 10*3/uL (ref 0.0–0.5)
Eosinophils Relative: 0 %
HCT: 38.4 % — ABNORMAL LOW (ref 39.0–52.0)
Hemoglobin: 12.4 g/dL — ABNORMAL LOW (ref 13.0–17.0)
Immature Granulocytes: 1 %
Lymphocytes Relative: 6 %
Lymphs Abs: 1.1 10*3/uL (ref 0.7–4.0)
MCH: 30.4 pg (ref 26.0–34.0)
MCHC: 32.3 g/dL (ref 30.0–36.0)
MCV: 94.1 fL (ref 80.0–100.0)
Monocytes Absolute: 0.9 10*3/uL (ref 0.1–1.0)
Monocytes Relative: 5 %
Neutro Abs: 15.4 10*3/uL — ABNORMAL HIGH (ref 1.7–7.7)
Neutrophils Relative %: 88 %
Platelets: 333 10*3/uL (ref 150–400)
RBC: 4.08 MIL/uL — ABNORMAL LOW (ref 4.22–5.81)
RDW: 13.6 % (ref 11.5–15.5)
WBC: 17.6 10*3/uL — ABNORMAL HIGH (ref 4.0–10.5)
nRBC: 0 % (ref 0.0–0.2)

## 2021-03-03 LAB — CBC
HCT: 37.8 % — ABNORMAL LOW (ref 39.0–52.0)
Hemoglobin: 11.9 g/dL — ABNORMAL LOW (ref 13.0–17.0)
MCH: 30 pg (ref 26.0–34.0)
MCHC: 31.5 g/dL (ref 30.0–36.0)
MCV: 95.2 fL (ref 80.0–100.0)
Platelets: 340 10*3/uL (ref 150–400)
RBC: 3.97 MIL/uL — ABNORMAL LOW (ref 4.22–5.81)
RDW: 13.8 % (ref 11.5–15.5)
WBC: 16.7 10*3/uL — ABNORMAL HIGH (ref 4.0–10.5)
nRBC: 0 % (ref 0.0–0.2)

## 2021-03-03 LAB — COMPREHENSIVE METABOLIC PANEL
ALT: 80 U/L — ABNORMAL HIGH (ref 0–44)
AST: 39 U/L (ref 15–41)
Albumin: 3.3 g/dL — ABNORMAL LOW (ref 3.5–5.0)
Alkaline Phosphatase: 223 U/L — ABNORMAL HIGH (ref 38–126)
Anion gap: 10 (ref 5–15)
BUN: 28 mg/dL — ABNORMAL HIGH (ref 8–23)
CO2: 24 mmol/L (ref 22–32)
Calcium: 8.9 mg/dL (ref 8.9–10.3)
Chloride: 102 mmol/L (ref 98–111)
Creatinine, Ser: 1.08 mg/dL (ref 0.61–1.24)
GFR, Estimated: >60 mL/min (ref >60)
Glucose, Bld: 99 mg/dL (ref 70–99)
Potassium: 4 mmol/L (ref 3.5–5.1)
Sodium: 136 mmol/L (ref 135–145)
Total Bilirubin: 0.8 mg/dL (ref 0.3–1.2)
Total Protein: 7.6 g/dL (ref 6.5–8.1)

## 2021-03-03 LAB — C-REACTIVE PROTEIN: CRP: 9.2 mg/dL — ABNORMAL HIGH (ref <1.0)

## 2021-03-03 LAB — CREATININE, SERUM
Creatinine, Ser: 1.07 mg/dL (ref 0.61–1.24)
GFR, Estimated: >60 mL/min (ref >60)

## 2021-03-03 LAB — LACTIC ACID, PLASMA: Lactic Acid, Venous: 1.1 mmol/L (ref 0.5–1.9)

## 2021-03-03 LAB — RESP PANEL BY RT-PCR (FLU A&B, COVID) ARPGX2
Influenza A by PCR: NEGATIVE
Influenza B by PCR: NEGATIVE
SARS Coronavirus 2 by RT PCR: POSITIVE — AB

## 2021-03-03 LAB — PROTIME-INR
INR: 1.1 (ref 0.8–1.2)
Prothrombin Time: 13.6 seconds (ref 11.4–15.2)

## 2021-03-03 LAB — APTT: aPTT: 31 seconds (ref 24–36)

## 2021-03-03 LAB — FERRITIN: Ferritin: 672 ng/mL — ABNORMAL HIGH (ref 24–336)

## 2021-03-03 MED ORDER — VITAMIN D 25 MCG (1000 UNIT) PO TABS
1000.0000 [IU] | ORAL_TABLET | Freq: Every day | ORAL | Status: DC
  Administered 2021-03-04 – 2021-03-15 (×12): 1000 [IU] via ORAL
  Filled 2021-03-03 (×12): qty 1

## 2021-03-03 MED ORDER — SODIUM CHLORIDE 0.9 % IV SOLN
2.0000 g | INTRAVENOUS | Status: DC
  Administered 2021-03-03 – 2021-03-16 (×78): 2 g via INTRAVENOUS
  Filled 2021-03-03 (×2): qty 2000
  Filled 2021-03-03 (×2): qty 2
  Filled 2021-03-03: qty 2000
  Filled 2021-03-03 (×2): qty 2
  Filled 2021-03-03: qty 2000
  Filled 2021-03-03 (×2): qty 2
  Filled 2021-03-03 (×3): qty 2000
  Filled 2021-03-03: qty 2
  Filled 2021-03-03 (×2): qty 2000
  Filled 2021-03-03: qty 2
  Filled 2021-03-03: qty 2000
  Filled 2021-03-03 (×4): qty 2
  Filled 2021-03-03 (×4): qty 2000
  Filled 2021-03-03 (×2): qty 2
  Filled 2021-03-03 (×4): qty 2000
  Filled 2021-03-03 (×2): qty 2
  Filled 2021-03-03: qty 2000
  Filled 2021-03-03: qty 2
  Filled 2021-03-03 (×4): qty 2000
  Filled 2021-03-03 (×2): qty 2
  Filled 2021-03-03: qty 2000
  Filled 2021-03-03: qty 2
  Filled 2021-03-03: qty 2000
  Filled 2021-03-03: qty 2
  Filled 2021-03-03 (×2): qty 2000
  Filled 2021-03-03 (×5): qty 2
  Filled 2021-03-03 (×2): qty 2000
  Filled 2021-03-03: qty 2
  Filled 2021-03-03 (×5): qty 2000
  Filled 2021-03-03: qty 2
  Filled 2021-03-03 (×3): qty 2000
  Filled 2021-03-03 (×3): qty 2
  Filled 2021-03-03: qty 2000
  Filled 2021-03-03: qty 2
  Filled 2021-03-03: qty 2000
  Filled 2021-03-03: qty 2
  Filled 2021-03-03 (×4): qty 2000
  Filled 2021-03-03 (×2): qty 2
  Filled 2021-03-03 (×2): qty 2000
  Filled 2021-03-03: qty 2
  Filled 2021-03-03 (×3): qty 2000
  Filled 2021-03-03: qty 2
  Filled 2021-03-03 (×6): qty 2000
  Filled 2021-03-03: qty 2

## 2021-03-03 MED ORDER — SODIUM CHLORIDE 0.9 % IV SOLN: 100.0000 mg | Freq: Every day | INTRAVENOUS | Status: DC

## 2021-03-03 MED ORDER — ZINC SULFATE 220 (50 ZN) MG PO CAPS
220.0000 mg | ORAL_CAPSULE | Freq: Every day | ORAL | Status: DC
  Administered 2021-03-03 – 2021-03-15 (×13): 220 mg via ORAL
  Filled 2021-03-03 (×14): qty 1

## 2021-03-03 MED ORDER — FLUTICASONE PROPIONATE 50 MCG/ACT NA SUSP: 1.0000 | Freq: Every day | NASAL | Status: DC | PRN

## 2021-03-03 MED ORDER — ENOXAPARIN SODIUM 40 MG/0.4ML ~~LOC~~ SOLN
40.0000 mg | SUBCUTANEOUS | Status: DC
  Administered 2021-03-03 – 2021-03-10 (×8): 40 mg via SUBCUTANEOUS
  Filled 2021-03-03 (×8): qty 0.4

## 2021-03-03 MED ORDER — ACETAMINOPHEN 325 MG PO TABS
650.0000 mg | ORAL_TABLET | Freq: Four times a day (QID) | ORAL | Status: DC | PRN
  Administered 2021-03-03 – 2021-03-04 (×2): 650 mg via ORAL
  Filled 2021-03-03 (×2): qty 2

## 2021-03-03 MED ORDER — SODIUM CHLORIDE 0.9 % IV SOLN: INTRAVENOUS | Status: DC

## 2021-03-03 MED ORDER — GUAIFENESIN-DM 100-10 MG/5ML PO SYRP
10.0000 mL | ORAL_SOLUTION | ORAL | Status: DC | PRN
  Administered 2021-03-04 – 2021-03-06 (×3): 10 mL via ORAL
  Filled 2021-03-03 (×3): qty 10

## 2021-03-03 MED ORDER — ASCORBIC ACID 500 MG PO TABS
500.0000 mg | ORAL_TABLET | Freq: Every day | ORAL | Status: DC
  Administered 2021-03-03 – 2021-03-15 (×13): 500 mg via ORAL
  Filled 2021-03-03 (×13): qty 1

## 2021-03-03 MED ORDER — SODIUM CHLORIDE 0.9 % IV SOLN
200.0000 mg | Freq: Once | INTRAVENOUS | Status: DC
  Filled 2021-03-03: qty 40

## 2021-03-03 MED ORDER — HYDRALAZINE HCL 20 MG/ML IJ SOLN: 10.0000 mg | INTRAMUSCULAR | Status: DC | PRN

## 2021-03-03 MED ORDER — ALBUTEROL SULFATE HFA 108 (90 BASE) MCG/ACT IN AERS
2.0000 | INHALATION_SPRAY | Freq: Four times a day (QID) | RESPIRATORY_TRACT | Status: DC
  Administered 2021-03-03 – 2021-03-04 (×4): 2 via RESPIRATORY_TRACT
  Filled 2021-03-03: qty 6.7

## 2021-03-03 MED ORDER — LACTATED RINGERS IV SOLN: INTRAVENOUS | Status: AC

## 2021-03-03 MED ORDER — LACTATED RINGERS IV BOLUS (SEPSIS)
1000.0000 mL | Freq: Once | INTRAVENOUS | Status: AC
  Administered 2021-03-03: 1000 mL via INTRAVENOUS

## 2021-03-03 MED ORDER — ACETAMINOPHEN 650 MG RE SUPP: 650.0000 mg | Freq: Four times a day (QID) | RECTAL | Status: DC | PRN

## 2021-03-03 MED ORDER — LEVOTHYROXINE SODIUM 75 MCG PO TABS
175.0000 ug | ORAL_TABLET | Freq: Every day | ORAL | Status: DC
  Administered 2021-03-04 – 2021-03-16 (×12): 175 ug via ORAL
  Filled 2021-03-03 (×13): qty 1

## 2021-03-03 MED ORDER — SODIUM CHLORIDE 0.9 % IV SOLN
2.0000 g | Freq: Two times a day (BID) | INTRAVENOUS | Status: DC
  Administered 2021-03-03 – 2021-03-16 (×27): 2 g via INTRAVENOUS
  Filled 2021-03-03 (×2): qty 2
  Filled 2021-03-03: qty 20
  Filled 2021-03-03: qty 2
  Filled 2021-03-03 (×2): qty 20
  Filled 2021-03-03: qty 2
  Filled 2021-03-03 (×2): qty 20
  Filled 2021-03-03 (×2): qty 2
  Filled 2021-03-03 (×3): qty 20
  Filled 2021-03-03: qty 2
  Filled 2021-03-03 (×6): qty 20
  Filled 2021-03-03: qty 2
  Filled 2021-03-03 (×8): qty 20

## 2021-03-03 MED ORDER — HYDROCOD POLST-CPM POLST ER 10-8 MG/5ML PO SUER
5.0000 mL | Freq: Two times a day (BID) | ORAL | Status: DC | PRN
  Administered 2021-03-04 (×2): 5 mL via ORAL
  Filled 2021-03-03 (×2): qty 5

## 2021-03-03 NOTE — Consult Note (Addendum)
Date of Admission:  03/03/2021          Reason for Consult:  Enterococcus faecalis bacteremia and likely endocarditis    Referring Provider: Connye Burkitt auto consult and Dr. Rhona Leavens   Assessment:  1. 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 Intermittent corticosteroid use 4. Prior COVID-19 infection 5. COVID 19 + again this admission  Plan:  1. Start ampicillin 2 g every 4 hours 2. We will add ceftriaxone 2 g IV every 12 hours with assumption he has bacterial endocarditis 3. 2D echocardiogram and he will need a transesophageal echocardiogram 4. Discontinue corticosteroids 5. Screen for HIV and viral hepatitides 6. Defer COVID 19 management to his primary team I have not myself been actively treating COVID 19 during this pandemic.   Active Problems:   Sepsis (HCC)   Scheduled Meds: . diphenhydrAMINE  50 mg Oral Once   Continuous Infusions: . ampicillin (OMNIPEN) IV Stopped (03/03/21 1620)  . lactated ringers 150 mL/hr at 03/03/21 1549   PRN Meds:.  HPI: Brandon [name] is a 70 y.o. male past medical history significant for atrial fibrillation BPH problems with urinary tract infections in the last few years who contracted COVID-19 in 2021.  Since then he began to struggle with symptoms of fevers that were initially ascribed to a urinary tract infection and resolved with antibiotics.  He recalls however going back to see his PCP with fevers again this time without dysuria and with a UA that was negative and cultures that were negative.  He was nonetheless given antibiotics and felt much better.  He continued to have trouble with malaise and began to have a migratory pain in his joints.  He was seen by rheumatology who noted quite high inflammatory markers.  No specific rheumatological diagnosis could be pin down but he was given courses of corticosteroids it also improved his symptomatology.  More recently he began to have  overtly drenching sweats and fevers and was also having cough and shortness of breath.  He came to the ER yesterday and was evaluated with CT of the chest abdomen and pelvis.  CT suggested possible right upper lobe pneumonia but otherwise were not much in the way of acute finding blood cultures were taken and he was prescribed Augmentin.  In the interim his blood cultures have come back positive for Enterococcus faecalis.  He has a murmur on exam which he says he has had for a while but he believes is worsening.  I am fairly certain that he has been suffering from subacute endocarditis with Enterococcus faecalis.  Ampicillin will be started in the ER and I am going to add ceftriaxone 2 g every 12 to presumptively treat for endocarditis.  He clearly needs a 2D echocardiogram and then a transesophageal echocardiogram.  We will follow closely.   Review of Systems: Review of Systems  Constitutional: Positive for chills, diaphoresis, fever, malaise/fatigue and weight loss.  HENT: Negative for congestion, hearing loss, sore throat and tinnitus.   Eyes: Negative for blurred vision and double vision.  Respiratory: Positive for shortness of breath. Negative for cough, sputum production and wheezing.   Cardiovascular: Negative for chest pain, palpitations and leg swelling.  Gastrointestinal: Negative for abdominal pain, blood in stool, constipation, diarrhea, heartburn, melena, nausea and vomiting.  Genitourinary: Negative for dysuria, flank pain and hematuria.  Musculoskeletal: Positive for joint pain and myalgias. Negative for back pain and falls.  Skin: Negative for itching and rash.  Neurological: Negative for dizziness, sensory change, focal weakness, loss of consciousness, weakness and headaches.  Endo/Heme/Allergies: Does not bruise/bleed easily.  Psychiatric/Behavioral: Negative for depression, memory loss and suicidal ideas. The patient is not nervous/anxious.     Past Medical History:   Diagnosis Date  . Atrial fibrillation (HCC)   . BPH (benign prostatic hyperplasia)   . Cervical spine disease   . Chest pain 2007   none since  . Gallbladder disorder   . Hemopneumothorax on left   . Hiatal hernia   . Hypercholesteremia   . Hyperlipidemia   . Meniere disease   . Meniere's disease   . PONV (postoperative nausea and vomiting)    also has trouble waking up    Social History   Tobacco Use  . Smoking status: Former Smoker    Quit date: 04/18/1979    Years since quitting: 41.9  . Smokeless tobacco: Never Used  Vaping Use  . Vaping Use: Never used  Substance Use Topics  . Alcohol use: Yes    Comment: 3 timesa a week (wine or beer)  . Drug use: No    Family History  Problem Relation Age of Onset  . Hypertension Mother   . Breast cancer Mother   . Heart disease Father   . Emphysema Father   . Heart attack Sister   . Heart attack Brother    Allergies  Allergen Reactions  . Ceftin [Cefuroxime Axetil] Diarrhea  . Other Other (See Comments)  . Oxycodone Nausea And Vomiting  . Prednisone & Diphenhydramine     Other reaction(s): Unknown  . Iohexol     Patient started sneezing during the injection and continued sneezing after exam. ER doc notified and patient was given benadryl and cortisone for this. 03/10/17.    OBJECTIVE: Blood pressure (!) 142/83, pulse (!) 106, temperature 97.7 F (36.5 C), temperature source Oral, resp. rate (!) 24, SpO2 96 %.  Physical Exam Constitutional:      Appearance: He is well-developed.  HENT:     Head: Normocephalic and atraumatic.  Eyes:     Extraocular Movements: Extraocular movements intact.     Conjunctiva/sclera: Conjunctivae normal.  Cardiovascular:     Rate and Rhythm: Normal rate and regular rhythm.     Heart sounds: Murmur heard.      Comments: Murmur maximal at PMI with radiation to the axilla Pulmonary:     Effort: Pulmonary effort is normal. No respiratory distress.     Breath sounds: No stridor. No  wheezing, rhonchi or rales.  Abdominal:     General: There is no distension.     Palpations: Abdomen is soft. There is no mass.     Tenderness: There is no abdominal tenderness.     Hernia: No hernia is present.  Musculoskeletal:        General: No tenderness. Normal range of motion.     Cervical back: Normal range of motion and neck supple.  Skin:    General: Skin is warm and dry.     Coloration: Skin is not pale.     Findings: No erythema or rash.  Neurological:     General: No focal deficit present.     Mental Status: He is alert and oriented to person, place, and time.  Psychiatric:        Mood and Affect: Mood normal.        Behavior: Behavior normal.        Thought Content: Thought content normal.  Judgment: Judgment normal.     Lab Results Lab Results  Component Value Date   WBC 17.6 (H) 03/03/2021   HGB 12.4 (L) 03/03/2021   HCT 38.4 (L) 03/03/2021   MCV 94.1 03/03/2021   PLT 333 03/03/2021    Lab Results  Component Value Date   CREATININE 1.08 03/03/2021   BUN 28 (H) 03/03/2021   NA 136 03/03/2021   K 4.0 03/03/2021   CL 102 03/03/2021   CO2 24 03/03/2021    Lab Results  Component Value Date   ALT 80 (H) 03/03/2021   AST 39 03/03/2021   ALKPHOS 223 (H) 03/03/2021   BILITOT 0.8 03/03/2021     Microbiology: Recent Results (from the past 240 hour(s))  Culture, blood (routine x 2)     Status: None (Preliminary result)   Collection Time: 03/02/21 11:50 AM   Specimen: BLOOD  Result Value Ref Range Status   Specimen Description   Final    BLOOD LEFT ANTECUBITAL Performed at [facility] [place], [address] [place], [place] [address]    Special Requests   Final    BOTTLES DRAWN AEROBIC AND ANAEROBIC Blood Culture adequate volume Performed at [facility] [place], [address] [place], [place] [address]    Culture  Setup Time   Final    GRAM POSITIVE COCCI IN BOTH AEROBIC AND ANAEROBIC BOTTLES CRITICAL VALUE NOTED.  VALUE IS  CONSISTENT WITH PREVIOUSLY REPORTED AND CALLED VALUE. Performed at [facility], [address] [place], [place] [address]    Culture GRAM POSITIVE COCCI  Final   Report Status PENDING  Incomplete  Culture, blood (routine x 2)     Status: None (Preliminary result)   Collection Time: 03/02/21  1:10 PM   Specimen: Right Antecubital; Blood  Result Value Ref Range Status   Specimen Description   Final    RIGHT ANTECUBITAL BLOOD Performed at [facility] [place], [address] [place], [place] [address]    Special Requests   Final    BOTTLES DRAWN AEROBIC AND ANAEROBIC Blood Culture adequate volume Performed at [facility] [place], [address] [place], [place] [address]    Culture  Setup Time   Final    GRAM POSITIVE COCCI IN CHAINS IN BOTH AEROBIC AND ANAEROBIC BOTTLES CRITICAL RESULT CALLED TO, READ BACK BY AND VERIFIED WITH: Heywood BeneS GOUGE RN @1205  03/03/21 EB Performed at [facility], [address] [place], [place] [address]    Culture GRAM POSITIVE COCCI  Final   Report Status PENDING  Incomplete  Blood Culture ID Panel (Reflexed)     Status: Abnormal   Collection Time: 03/02/21  1:10 PM  Result Value Ref Range Status   Enterococcus faecalis DETECTED (A) NOT DETECTED Final    Comment: CRITICAL RESULT CALLED TO, READ BACK BY AND VERIFIED WITH: S GOUGE RN @1203  03/03/21 EB    Enterococcus Faecium NOT DETECTED NOT DETECTED Final   Listeria monocytogenes NOT DETECTED NOT DETECTED Final   Staphylococcus species NOT DETECTED NOT DETECTED Final   Staphylococcus aureus (BCID) NOT DETECTED NOT DETECTED Final   Staphylococcus epidermidis NOT DETECTED NOT DETECTED Final   Staphylococcus lugdunensis NOT DETECTED NOT DETECTED Final   Streptococcus species NOT DETECTED NOT DETECTED Final   Streptococcus agalactiae NOT DETECTED NOT DETECTED Final   Streptococcus pneumoniae NOT DETECTED NOT DETECTED Final   Streptococcus pyogenes NOT DETECTED NOT DETECTED Final    A.calcoaceticus-baumannii NOT DETECTED  NOT DETECTED Final   Bacteroides fragilis NOT DETECTED NOT DETECTED Final   Enterobacterales NOT DETECTED NOT DETECTED Final   Enterobacter cloacae complex NOT DETECTED NOT DETECTED Final   Escherichia coli NOT DETECTED NOT DETECTED Final   Klebsiella aerogenes NOT DETECTED NOT DETECTED Final   Klebsiella oxytoca NOT DETECTED NOT DETECTED Final   Klebsiella pneumoniae NOT DETECTED NOT DETECTED Final   Proteus species NOT DETECTED NOT DETECTED Final   Salmonella species NOT DETECTED NOT DETECTED Final   Serratia marcescens NOT DETECTED NOT DETECTED Final   Haemophilus influenzae NOT DETECTED NOT DETECTED Final   Neisseria meningitidis NOT DETECTED NOT DETECTED Final   Pseudomonas aeruginosa NOT DETECTED NOT DETECTED Final   Stenotrophomonas maltophilia NOT DETECTED NOT DETECTED Final   Candida albicans NOT DETECTED NOT DETECTED Final   Candida auris NOT DETECTED NOT DETECTED Final   Candida glabrata NOT DETECTED NOT DETECTED Final   Candida krusei NOT DETECTED NOT DETECTED Final   Candida parapsilosis NOT DETECTED NOT DETECTED Final   Candida tropicalis NOT DETECTED NOT DETECTED Final   Cryptococcus neoformans/gattii NOT DETECTED NOT DETECTED Final   Vancomycin resistance NOT DETECTED NOT DETECTED Final    Comment: Performed at [facility], [address], [place], [place] [address]  Resp Panel by RT-PCR (Flu A&B, Covid) Nasopharyngeal Swab     Status: None   Collection Time: 03/02/21  3:41 PM   Specimen: Nasopharyngeal Swab; Nasopharyngeal(NP) swabs in vial transport medium  Result Value Ref Range Status   SARS Coronavirus 2 by RT PCR NEGATIVE NEGATIVE Final    Comment: (NOTE) SARS-CoV-2 target nucleic acids are NOT DETECTED.  The SARS-CoV-2 RNA is generally detectable in upper respiratory specimens during the acute phase of infection. The lowest concentration of SARS-CoV-2 viral copies this assay can detect is 138 copies/mL. A negative  result does not preclude SARS-Cov-2 infection and should not be used as the sole basis for treatment or other patient management decisions. A negative result may occur with  improper specimen collection/handling, submission of specimen other than nasopharyngeal swab, presence of viral mutation(s) within the areas targeted by this assay, and inadequate number of viral copies(<138 copies/mL). A negative result must be combined with clinical observations, patient history, and epidemiological information. The expected result is Negative.  Fact Sheet for Patients:  BloggerCourse.com  Fact Sheet for Healthcare Providers:  SeriousBroker.it  This test is no t yet approved or cleared by the [place] FDA and  has been authorized for detection and/or diagnosis of SARS-CoV-2 by FDA under an Emergency Use Authorization (EUA). This EUA will remain  in effect (meaning this test can be used) for the duration of the COVID-19 declaration under Section 564(b)(1) of the Act, 21 U.S.C.section 360bbb-3(b)(1), unless the authorization is terminated  or revoked sooner.       Influenza A by PCR NEGATIVE NEGATIVE Final   Influenza B by PCR NEGATIVE NEGATIVE Final    Comment: (NOTE) The Xpert Xpress SARS-CoV-2/FLU/RSV plus assay is intended as an aid in the diagnosis of influenza from Nasopharyngeal swab specimens and should not be used as a sole basis for treatment. Nasal washings and aspirates are unacceptable for Xpert Xpress SARS-CoV-2/FLU/RSV testing.  Fact Sheet for Patients: BloggerCourse.com  Fact Sheet for Healthcare Providers: SeriousBroker.it  This test is not yet approved or cleared by the [place] FDA and has been authorized for detection and/or diagnosis of SARS-CoV-2 by FDA under an Emergency Use Authorization (EUA). This EUA will remain in effect (meaning  this test can be used)  for the duration of the COVID-19 declaration under Section 564(b)(1) of the Act, 21 U.S.C. section 360bbb-3(b)(1), unless the authorization is terminated or revoked.  Performed at [facility], [address] [place], [place] [address]   Resp Panel by RT-PCR (Flu A&B, Covid) Nasopharyngeal Swab     Status: Abnormal   Collection Time: 03/03/21  3:08 PM   Specimen: Nasopharyngeal Swab; Nasopharyngeal(NP) swabs in vial transport medium  Result Value Ref Range Status   SARS Coronavirus 2 by RT PCR POSITIVE (A) NEGATIVE Final    Comment: RESULT CALLED TO, READ BACK BY AND VERIFIED WITH: BOWEN,M. RN AT 1628 03/03/21 MULLINS,T (NOTE) SARS-CoV-2 target nucleic acids are DETECTED.  The SARS-CoV-2 RNA is generally detectable in upper respiratory specimens during the acute phase of infection. Positive results are indicative of the presence of the identified virus, but do not rule out bacterial infection or co-infection with other pathogens not detected by the test. Clinical correlation with patient history and other diagnostic information is necessary to determine patient infection status. The expected result is Negative.  Fact Sheet for Patients: BloggerCourse.com  Fact Sheet for Healthcare Providers: SeriousBroker.it  This test is not yet approved or cleared by the [place] FDA and  has been authorized for detection and/or diagnosis of SARS-CoV-2 by FDA under an Emergency Use Authorization (EUA).  This EUA will remain in effect (meaning this test c an be used) for the duration of  the COVID-19 declaration under Section 564(b)(1) of the Act, 21 U.S.C. section 360bbb-3(b)(1), unless the authorization is terminated or revoked sooner.     Influenza A by PCR NEGATIVE NEGATIVE Final   Influenza B by PCR NEGATIVE NEGATIVE Final    Comment: (NOTE) The Xpert Xpress SARS-CoV-2/FLU/RSV plus assay is intended as an aid in the  diagnosis of influenza from Nasopharyngeal swab specimens and should not be used as a sole basis for treatment. Nasal washings and aspirates are unacceptable for Xpert Xpress SARS-CoV-2/FLU/RSV testing.  Fact Sheet for Patients: BloggerCourse.com  Fact Sheet for Healthcare Providers: SeriousBroker.it  This test is not yet approved or cleared by the [place] FDA and has been authorized for detection and/or diagnosis of SARS-CoV-2 by FDA under an Emergency Use Authorization (EUA). This EUA will remain in effect (meaning this test can be used) for the duration of the COVID-19 declaration under Section 564(b)(1) of the Act, 21 U.S.C. section 360bbb-3(b)(1), unless the authorization is terminated or revoked.  Performed at [facility], [address], [place], [place] [address]     Acey Lav, MD [facility] [phone] pager  03/03/2021, 4:56 PM

## 2021-03-03 NOTE — ED Triage Notes (Signed)
Pt presents with c/o positive blood cultures. Pt received a call today after being seen yesterday that his cultures were positive and he needed IV antibiotics. Pt was diagnosed with pneumonia yesterday.

## 2021-03-03 NOTE — ED Provider Notes (Addendum)
[facility] DEPT Provider Note   CSN: [id number] Arrival date & time: 03/03/21  1307     History Chief Complaint  Patient presents with  . Positive Blood Cultures    Brandon [name] is a 70 y.o. male who was seen in the emergency room yesterday for shortness of breath, fatigue, fevers, night sweats, and myalgias.  He is following with rheumatology and has been on a taper of prednisone which he states helps his muscle aches somewhat but does not help with the fevers.  T-max at home was 101 F.  Patient was diagnosed with right upper lobe pneumonia on CTA yesterday and was discharged on Augmentin and azithromycin, however he was called to return to the emergency department today due to positive blood cultures grown from  specimens collected yesterday.  Patient does endorse fevers at home last night, and night sweats that soaked his bed.  He endorses dyspnea on exertion, states severe shortness of breath when he was trying to change his sheets this morning.  At this time he endorses shortness of breath but denies any chest pain, endorses palpitations and myalgias.  Endorses mild generalized abdominal pain that migrates.  I personally reviewed this patient's medical records.  He has history of COVID-19 infection in August 2021, atrial fibrillation, BPH, hyperlipidemia, coronary artery disease, hypothyroidism.  Patient with echocardiogram in 2013 with LVEF of 65%.  Patient endorses extensive history of cardiac murmur.  HPI     Past Medical History:  Diagnosis Date  . Atrial fibrillation ([place])   . BPH (benign prostatic hyperplasia)   . Cervical spine disease   . Chest pain 2007   none since  . Gallbladder disorder   . Hemopneumothorax on left   . Hiatal hernia   . Hypercholesteremia   . Hyperlipidemia   . Meniere disease   . Meniere's disease   . PONV (postoperative nausea and vomiting)    also has trouble waking up    Patient Active Problem List    Diagnosis Date Noted  . Sepsis ([place]) 03/03/2021  . Essential hypertension 03/11/2017  . Hypothyroidism 03/10/2017  . Abnormal liver function 03/10/2017  . Allergic reaction 03/10/2017  . Abnormal LFTs 03/10/2017  . Blunt chest trauma 04/02/2016  . Acute urinary retention 04/02/2016  . Acute blood loss anemia 04/02/2016  . Fracture of left clavicle 03/30/2016  . Multiple fractures of ribs of left side 03/30/2016  . Left pulmonary contusion 03/30/2016  . Hemopneumothorax, left 03/30/2016  . Hiatal hernia   . Gallbladder disorder   . Hypercholesteremia   . Chest pain 08/06/2012  . Elevated BP 08/06/2012    Past Surgical History:  Procedure Laterality Date  . CARDIAC CATHETERIZATION    . COLONOSCOPY    . ORIF CLAVICULAR FRACTURE Left 04/18/2016   Procedure: OPEN REDUCTION INTERNAL FIXATION (ORIF) LEFT CLAVICLE FRACTURE;  Surgeon: Justice Britain, MD;  Location: [facility];  Service: Orthopedics;  Laterality: Left;  . THYROIDECTOMY    . TONSILLECTOMY         Family History  Problem Relation Age of Onset  . Hypertension Mother   . Breast cancer Mother   . Heart disease Father   . Emphysema Father   . Heart attack Sister   . Heart attack Brother     Social History   Tobacco Use  . Smoking status: Former Smoker    Quit date: 04/18/1979    Years since quitting: 41.9  . Smokeless tobacco: Never Used  Vaping Use  .  Vaping Use: Never used  Substance Use Topics  . Alcohol use: Yes    Comment: 3 timesa a week (wine or beer)  . Drug use: No    Home Medications Prior to Admission medications   Medication Sig Start Date End Date Taking? Authorizing Provider  amoxicillin-clavulanate (AUGMENTIN) 875-125 MG tablet Take 1 tablet by mouth every 12 (twelve) hours. Start tomorrow at 03/03/21 Patient taking differently: Take 1 tablet by mouth every 12 (twelve) hours. Start date: at 03/03/21 03/02/21  Yes Blanchie Dessert, MD  azithromycin (ZITHROMAX Z-PAK) 250 MG tablet Take 1 tablet (250 mg  total) by mouth daily. Start tomorrow 03/03/21 Patient taking differently: Take 250 mg by mouth daily. Start Date: 03/03/21 03/02/21  Yes Blanchie Dessert, MD  cholecalciferol (VITAMIN D) 1000 units tablet Take 1,000 Units by mouth daily.   Yes [provider]  diphenhydrAMINE (BENADRYL) 50 MG capsule Take 50 mg by mouth every 6 (six) hours as needed for allergies or itching.   Yes [provider]  fluticasone (FLONASE) 50 MCG/ACT nasal spray Place 1 spray into both nostrils daily as needed for allergies. 03/11/12  Yes [provider]  levothyroxine (SYNTHROID) 175 MCG tablet Take 175 mcg by mouth daily. 09/14/20  Yes [provider]  predniSONE (DELTASONE) 10 MG tablet Take 10 mg by mouth daily with breakfast.   Yes [provider]  Probiotic Product (PROBIOTIC-10 PO) Take 1 capsule by mouth daily.    Yes [provider]  testosterone cypionate (DEPOTESTOSTERONE CYPIONATE) 200 MG/ML injection Inject 200 mg into the muscle once a week. 08/10/19  Yes [provider]  triamterene-hydrochlorothiazide (DYAZIDE) 37.5-25 MG capsule Take 1 capsule by mouth daily.   Yes [provider]  predniSONE (DELTASONE) 50 MG tablet Pt to take 50 mg of prednisone on 03/02/21 at 2:40 AM, 50 mg of prednisone on 03/02/21 at 8:40 AM, and 50 mg of prednisone on 03/02/21 at 2:40 PM. Pt is also to take 50 mg of benadryl on 03/02/21 at 2:40 PM. Please call [phone] with any questions. Patient not taking: Reported on 03/03/2021 03/01/21   Logan Bores, MD    Allergies    Ceftin [cefuroxime axetil], Other, Oxycodone, Prednisone & diphenhydramine, and Iohexol  Review of Systems   Review of Systems  Constitutional: Positive for activity change, appetite change, chills, diaphoresis, fatigue and fever.  HENT: Negative.   Eyes: Negative.   Respiratory: Positive for cough and shortness of breath. Negative for chest tightness and wheezing.   Cardiovascular: Positive for  palpitations. Negative for chest pain and leg swelling.  Gastrointestinal: Positive for abdominal pain. Negative for diarrhea, nausea and vomiting.  Genitourinary: Negative for decreased urine volume, difficulty urinating, dysuria, frequency, genital sores, hematuria, scrotal swelling, testicular pain and urgency.  Musculoskeletal: Positive for arthralgias and myalgias. Negative for neck pain and neck stiffness.  Skin: Positive for pallor. Negative for rash and wound.  Neurological: Positive for light-headedness and headaches. Negative for dizziness, tremors, syncope, weakness and numbness.  Hematological: Negative.     Physical Exam Updated Vital Signs BP (!) 142/83   Pulse (!) 106   Temp 97.7 F (36.5 C) (Oral)   Resp (!) 24   SpO2 96%   Physical Exam Vitals and nursing note reviewed.  Constitutional:      Appearance: Normal appearance. He is normal weight. He is not toxic-appearing or diaphoretic.  HENT:     Head: Normocephalic and atraumatic.     Nose: Nose normal.     Mouth/Throat:  Mouth: Mucous membranes are moist.     Pharynx: Oropharynx is clear. Uvula midline. No oropharyngeal exudate or posterior oropharyngeal erythema.     Tonsils: No tonsillar exudate.  Eyes:     General: Lids are normal. Vision grossly intact.        Right eye: No discharge.        Left eye: No discharge.     Extraocular Movements: Extraocular movements intact.     Conjunctiva/sclera: Conjunctivae normal.     Pupils: Pupils are equal, round, and reactive to light.  Neck:     Trachea: Trachea and phonation normal.     Meningeal: Brudzinski's sign and Kernig's sign absent.  Cardiovascular:     Rate and Rhythm: Regular rhythm. Tachycardia present.     Pulses: Normal pulses.     Heart sounds: Murmur heard.   Systolic murmur is present with a grade of 3/6.   Pulmonary:     Effort: Pulmonary effort is normal. No tachypnea, bradypnea, accessory muscle usage, prolonged expiration, respiratory  distress or retractions.     Breath sounds: No stridor or transmitted upper airway sounds. Examination of the left-upper field reveals decreased breath sounds and rhonchi. Decreased breath sounds and rhonchi present. No wheezing or rales.  Chest:     Chest wall: No mass, lacerations, deformity, swelling, crepitus or edema.  Abdominal:     General: Bowel sounds are normal. There is no distension.     Palpations: Abdomen is soft.     Tenderness: There is no abdominal tenderness. There is no right CVA tenderness, left CVA tenderness, guarding or rebound.  Musculoskeletal:        General: No deformity.     Cervical back: Normal range of motion and neck supple. No edema, rigidity or crepitus. No pain with movement, spinous process tenderness or muscular tenderness.     Right lower leg: No edema.     Left lower leg: No edema.  Lymphadenopathy:     Cervical: No cervical adenopathy.  Skin:    General: Skin is warm and dry.  Neurological:     General: No focal deficit present.     Mental Status: He is alert and oriented to person, place, and time. Mental status is at baseline.     Cranial Nerves: Cranial nerves are intact.     Sensory: Sensation is intact.     Motor: Motor function is intact.     Gait: Gait is intact.  Psychiatric:        Mood and Affect: Mood normal.     ED Results / Procedures / Treatments   Labs (all labs ordered are listed, but only abnormal results are displayed) Labs Reviewed  RESP PANEL BY RT-PCR (FLU A&B, COVID) ARPGX2 - Abnormal; Notable for the following components:      Result Value   SARS Coronavirus 2 by RT PCR POSITIVE (*)    All other components within normal limits  COMPREHENSIVE METABOLIC PANEL - Abnormal; Notable for the following components:   BUN 28 (*)    Albumin 3.3 (*)    ALT 80 (*)    Alkaline Phosphatase 223 (*)    All other components within normal limits  CBC WITH DIFFERENTIAL/PLATELET - Abnormal; Notable for the following components:    WBC 17.6 (*)    RBC 4.08 (*)    Hemoglobin 12.4 (*)    HCT 38.4 (*)    Neutro Abs 15.4 (*)    Abs Immature Granulocytes 0.17 (*)  All other components within normal limits  CULTURE, BLOOD (ROUTINE X 2)  CULTURE, BLOOD (ROUTINE X 2)  URINE CULTURE  LACTIC ACID, PLASMA  APTT  PROTIME-INR  URINALYSIS, ROUTINE W REFLEX MICROSCOPIC  LACTIC ACID, PLASMA    EKG None  Radiology CT Angio Chest PE W/Cm &/Or Wo Cm  Result Date: 03/02/2021 CLINICAL DATA:  Fever, abdominal pain. EXAM: CT ANGIOGRAPHY CHEST CT ABDOMEN AND PELVIS WITH CONTRAST TECHNIQUE: Multidetector CT imaging of the chest was performed using the standard protocol during bolus administration of intravenous contrast. Multiplanar CT image reconstructions and MIPs were obtained to evaluate the vascular anatomy. Multidetector CT imaging of the abdomen and pelvis was performed using the standard protocol during bolus administration of intravenous contrast. Unfortunately, there is motion artifact during the abdominal scan, limiting these images. CONTRAST:  131m OMNIPAQUE IOHEXOL 350 MG/ML SOLN COMPARISON:  March 10, 2017. FINDINGS: CTA CHEST FINDINGS Cardiovascular: Satisfactory opacification of the pulmonary arteries to the segmental level. No evidence of pulmonary embolism. Normal heart size. No pericardial effusion. Mediastinum/Nodes: Status post thyroidectomy. The esophagus is unremarkable. No adenopathy is noted. Lungs/Pleura: No pneumothorax or pleural effusion is noted. Right upper lobe airspace opacity is noted concerning for pneumonia. Musculoskeletal: No chest wall abnormality. No acute or significant osseous findings. Review of the MIP images confirms the above findings. CT ABDOMEN and PELVIS FINDINGS Hepatobiliary: No focal liver abnormality is seen. No gallstones, gallbladder wall thickening, or biliary dilatation. Pancreas: Unremarkable. No pancreatic ductal dilatation or surrounding inflammatory changes. Spleen: Normal in size  without focal abnormality. Adrenals/Urinary Tract: Adrenal glands appear normal. Visualized portions of kidneys are unremarkable, although the lower poles are not well visualized due to patient motion artifact. No definite hydronephrosis or renal obstruction is noted. No definite renal or ureteral calculi are noted. Urinary bladder is unremarkable. Stomach/Bowel: Stomach is within normal limits. Appendix appears normal. No evidence of bowel wall thickening, distention, or inflammatory changes. Vascular/Lymphatic: No significant vascular findings are present. No enlarged abdominal or pelvic lymph nodes. Reproductive: Stable mild prostatic enlargement is noted. Other: Small fat containing right inguinal hernia is noted. No ascites is noted. Musculoskeletal: No acute or significant osseous findings. Review of the MIP images confirms the above findings. IMPRESSION: No definite evidence of pulmonary embolus. Probable right upper lobe pneumonia. Limited evaluation of the kidneys is noted due to patient motion artifact. Lower poles are not well visualized and therefore pathology in these areas cannot be excluded. No definite abnormality is seen involving the visualized portions of the kidneys. Small fat containing right inguinal hernia. Stable mild prostatic enlargement. Electronically Signed   By: JMarijo ConceptionM.D.   On: 03/02/2021 16:49   CT Abdomen Pelvis W Contrast  Result Date: 03/02/2021 CLINICAL DATA:  Fever, abdominal pain. EXAM: CT ANGIOGRAPHY CHEST CT ABDOMEN AND PELVIS WITH CONTRAST TECHNIQUE: Multidetector CT imaging of the chest was performed using the standard protocol during bolus administration of intravenous contrast. Multiplanar CT image reconstructions and MIPs were obtained to evaluate the vascular anatomy. Multidetector CT imaging of the abdomen and pelvis was performed using the standard protocol during bolus administration of intravenous contrast. Unfortunately, there is motion artifact during  the abdominal scan, limiting these images. CONTRAST:  1013mOMNIPAQUE IOHEXOL 350 MG/ML SOLN COMPARISON:  March 10, 2017. FINDINGS: CTA CHEST FINDINGS Cardiovascular: Satisfactory opacification of the pulmonary arteries to the segmental level. No evidence of pulmonary embolism. Normal heart size. No pericardial effusion. Mediastinum/Nodes: Status post thyroidectomy. The esophagus is unremarkable. No adenopathy is noted. Lungs/Pleura: No  pneumothorax or pleural effusion is noted. Right upper lobe airspace opacity is noted concerning for pneumonia. Musculoskeletal: No chest wall abnormality. No acute or significant osseous findings. Review of the MIP images confirms the above findings. CT ABDOMEN and PELVIS FINDINGS Hepatobiliary: No focal liver abnormality is seen. No gallstones, gallbladder wall thickening, or biliary dilatation. Pancreas: Unremarkable. No pancreatic ductal dilatation or surrounding inflammatory changes. Spleen: Normal in size without focal abnormality. Adrenals/Urinary Tract: Adrenal glands appear normal. Visualized portions of kidneys are unremarkable, although the lower poles are not well visualized due to patient motion artifact. No definite hydronephrosis or renal obstruction is noted. No definite renal or ureteral calculi are noted. Urinary bladder is unremarkable. Stomach/Bowel: Stomach is within normal limits. Appendix appears normal. No evidence of bowel wall thickening, distention, or inflammatory changes. Vascular/Lymphatic: No significant vascular findings are present. No enlarged abdominal or pelvic lymph nodes. Reproductive: Stable mild prostatic enlargement is noted. Other: Small fat containing right inguinal hernia is noted. No ascites is noted. Musculoskeletal: No acute or significant osseous findings. Review of the MIP images confirms the above findings. IMPRESSION: No definite evidence of pulmonary embolus. Probable right upper lobe pneumonia. Limited evaluation of the kidneys is  noted due to patient motion artifact. Lower poles are not well visualized and therefore pathology in these areas cannot be excluded. No definite abnormality is seen involving the visualized portions of the kidneys. Small fat containing right inguinal hernia. Stable mild prostatic enlargement. Electronically Signed   By: Marijo Conception M.D.   On: 03/02/2021 16:49   DG Chest Port 1 View  Result Date: 03/03/2021 CLINICAL DATA:  Fever. EXAM: PORTABLE CHEST 1 VIEW COMPARISON:  03/02/2021 FINDINGS: 1535 hours. Patchy airspace disease noted right upper lung and right base. Interstitial markings are diffusely coarsened with chronic features. Bones are diffusely demineralized. Telemetry leads overlie the chest. IMPRESSION: Similar appearance of patchy airspace disease right upper and lower lung compatible with pneumonia. Chronic interstitial changes. Electronically Signed   By: Misty Stanley M.D.   On: 03/03/2021 16:03   DG Chest Port 1 View  Result Date: 03/02/2021 CLINICAL DATA:  Shortness of breath EXAM: PORTABLE CHEST 1 VIEW COMPARISON:  July 03, 2020 FINDINGS: There is diffuse interstitial thickening without frank edema or consolidation. Heart size and pulmonary vascularity are normal. No adenopathy. Evidence of prior trauma with postoperative fixation left clavicle. Evidence of old healed fracture anterior right second rib. IMPRESSION: Diffuse interstitial thickening, likely representing a degree of chronic bronchitis. No edema or consolidation. Heart size normal. Electronically Signed   By: Lowella Grip III M.D.   On: 03/02/2021 12:13    Procedures .Critical Care Performed by: Emeline Darling, PA-C Authorized by: Emeline Darling, PA-C   Critical care provider statement:    Critical care time (minutes):  45   Critical care was necessary to treat or prevent imminent or life-threatening deterioration of the following conditions:  Sepsis   Critical care was time spent personally by me  on the following activities:  Discussions with consultants, evaluation of patient's response to treatment, examination of patient, ordering and performing treatments and interventions, ordering and review of laboratory studies, ordering and review of radiographic studies, pulse oximetry, re-evaluation of patient's condition, obtaining history from patient or surrogate and review of old charts    Medications Ordered in ED Medications  lactated ringers infusion ( Intravenous New Bag/Given 03/03/21 1549)  lactated ringers bolus 1,000 mL (1,000 mLs Intravenous New Bag/Given 03/03/21 1549)  ampicillin (OMNIPEN) 2 g in  sodium chloride 0.9 % 100 mL IVPB (0 g Intravenous Stopped 03/03/21 1620)    ED Course  I have reviewed the triage vital signs and the nursing notes.  Pertinent labs & imaging results that were available during my care of the patient were reviewed by me and considered in my medical decision making (see chart for details).  Clinical Course as of 03/03/21 1643  Sat Mar 03, 2021  1525 Patient was evaluated by Dr. Tommy Medal, infectious disease, simultaneously as this provider. Per his recommendations, will order ampicillin IV 2 g Q4H. He will continue to follow the patient once he is admitted. I appreciate his collaboration in the care of this patient.  [RS]  1640 Consult to hospitalist, Dr. Wyline Copas, who is agreeable to seeing this patient and admitting him to his service. I appreciate his collaboration in the care of this patient.  [RS]    Clinical Course User Index [RS] Adalaide Jaskolski, Sharlene Dory   MDM Rules/Calculators/A&P                         70 year old male return to the emergency department with concern for bacteremia, shortness of breath, fevers, and night sweats.  Patient was tachycardic and mildly hypertensive on intake.  Vital signs otherwise normal.  Cardiac exam revealed significant systolic murmur, tachycardia with regular rhythm.  Pulmonary exam revealed mildly decreased  breath sounds in the right upper lobe, with otherwise unremarkable.  Abdominal exam is benign.  Patient without any lower extremity edema.  Mild calf tenderness palpation without erythema, warmth to the touch, or swelling.  Neuro exam is normal.  Patient evaluated by infectious disease provider simultaneously in the ED with recommendation to proceed with ampicillin 2 g every 4 hours for Enterococcus faecalis on blood cultures (no vanA/B resistance detected).  We will proceed with antibiotic therapy per his recommendation; appreciate his collaboration of care of this patient.    Given evidence of bacteremia on patient's work-up yesterday, he will require admission to the hospital this time for IV antibiotics, echocardiogram.Brandon [name] voiced understanding of disposition recommendation is amenable to plan for admission at this time.  Will await basic laboratory studies prior to making call for admission to the hospital.  Patient appears stable at this time.  CBC with increased leukocytosis to 17.6, CMP with stable transaminitis and elevated alk phos.  Coag studies normal, UA unremarkable, lactic acid negative.  Patient is Covid positive again today.  Chest x-ray with patchy bilateral airspace opacities. Elevated Dimer, negative CTA yesterday. Will not repeat.   Consult to hospitalist as above, patient to be admitted to the hospital at this time.  Vital signs remained stable and he is currently receiving IV antibiotics.  Brandon [name] voiced understanding of his medical evaluation and treatment plan.  Each of his questions answered to his expressed satisfaction.  He is amenable to plan for admission at this time.  Brandon [name] was evaluated in Emergency Department on 03/03/2021 for the symptoms described in the history of present illness. He was evaluated in the context of the global COVID-19 pandemic, which necessitated consideration that the patient might be at risk for infection with the SARS-CoV-2 virus that  causes COVID-19. Institutional protocols and algorithms that pertain to the evaluation of patients at risk for COVID-19 are in a state of rapid change based on information released by regulatory bodies including the CDC and federal and state organizations. These policies and algorithms were followed during the patient's care in  the ED.  This chart was dictated using voice recognition software, Dragon. Despite the best efforts of this provider to proofread and correct errors, errors may still occur which can change documentation meaning.  Final Clinical Impression(s) / ED Diagnoses Final diagnoses:  None    Rx / DC Orders ED Discharge Orders    None       Emeline Darling, PA-C 03/03/21 1643    Ila Landowski, Gypsy Balsam, PA-C 03/03/21 1643    Varney Biles, MD 03/06/21 1557

## 2021-03-03 NOTE — Progress Notes (Addendum)
Pt would like to discuss risk and benefits of remdesivir with physician before first dose. Rescheduled in [facility] for 10 am and night physician notified. Per pt I believe he has had infusions before as last time he had covid he received OP infusions for covid. Never required oxygen and has had his vaccines.   Pt states he hasnt slept in a week. States he cant sleep in hospital. Pt states at home he takes 30 mg of melatonin and sometimes that helps.   Last lactic acid 1.1 Awaiting Echo for bacteremia, pt endorses murmur hx

## 2021-03-03 NOTE — H&P (Addendum)
History and Physical    Brandon [name] WVP:[id number] DOB: [birth year] DOA: 03/03/2021  PCP: Pcp, No  Patient coming from: Home  Chief Complaint: Bacteremia   HPI: Brandon [name] is a 70 y.o. male with medical history significant of prior covid pna, hypothyroid, HLD initially presented to ED on 4/8 with complaints of fevers, sob, nonproductive cough.  During workup, pt was found to have evidence suggesting RUL pna. Pt was recommended admission, however, pt declined and was ultimately discharged from the ED on augmentin and azithromycin. Blood cultures later returned pos for enterococcus and pt was asked to return to ED. Pt reports generalized weakness, increased cough, decreased exercise tolerance. Also reports fevers  ED Course: In the ED, pt was found to be tachycardic with tachypnea. WBC noted to be 17.6. Blood cx from 4/8 confirmed enterococcus. ID was consulted and hospitalist consulted for consideration for admission  Review of Systems:  Review of Systems  Constitutional: Positive for chills and fever.  HENT: Negative for ear discharge, ear pain and tinnitus.   Eyes: Negative for photophobia and pain.  Respiratory: Positive for cough, sputum production and shortness of breath.   Cardiovascular: Negative for palpitations and orthopnea.  Gastrointestinal: Negative for abdominal pain and vomiting.  Genitourinary: Negative for frequency, hematuria and urgency.  Musculoskeletal: Negative for back pain, falls and neck pain.  Neurological: Negative for speech change, focal weakness, seizures and loss of consciousness.  Psychiatric/Behavioral: Negative for hallucinations and memory loss. The patient is not nervous/anxious.     Past Medical History:  Diagnosis Date  . Atrial fibrillation (HCC)   . BPH (benign prostatic hyperplasia)   . Cervical spine disease   . Chest pain 2007   none since  . Gallbladder disorder   . Hemopneumothorax on left   . Hiatal hernia   . Hypercholesteremia    . Hyperlipidemia   . Meniere disease   . Meniere's disease   . PONV (postoperative nausea and vomiting)    also has trouble waking up    Past Surgical History:  Procedure Laterality Date  . CARDIAC CATHETERIZATION    . COLONOSCOPY    . ORIF CLAVICULAR FRACTURE Left 04/18/2016   Procedure: OPEN REDUCTION INTERNAL FIXATION (ORIF) LEFT CLAVICLE FRACTURE;  Surgeon: Francena Hanly, MD;  Location: [facility] OR;  Service: Orthopedics;  Laterality: Left;  . THYROIDECTOMY    . TONSILLECTOMY       reports that he quit smoking about 41 years ago. He has never used smokeless tobacco. He reports current alcohol use. He reports that he does not use drugs.  Allergies  Allergen Reactions  . Ceftin [Cefuroxime Axetil] Diarrhea  . Other Other (See Comments)  . Oxycodone Nausea And Vomiting  . Iohexol     Patient started sneezing during the injection and continued sneezing after exam. ER doc notified and patient was given benadryl and cortisone for this. 03/10/17.    Family History  Problem Relation Age of Onset  . Hypertension Mother   . Breast cancer Mother   . Heart disease Father   . Emphysema Father   . Heart attack Sister   . Heart attack Brother     Prior to Admission medications   Medication Sig Start Date End Date Taking? Authorizing Provider  amoxicillin-clavulanate (AUGMENTIN) 875-125 MG tablet Take 1 tablet by mouth every 12 (twelve) hours. Start tomorrow at 03/03/21 03/02/21   Gwyneth Sprout, MD  azithromycin (ZITHROMAX Z-PAK) 250 MG tablet Take 1 tablet (250 mg total) by mouth daily. Start  tomorrow 03/03/21 03/02/21   Gwyneth Sprout, MD  cholecalciferol (VITAMIN D) 1000 units tablet Take 1,000 Units by mouth daily.    [provider]  levothyroxine (SYNTHROID) 175 MCG tablet Take 175 mcg by mouth daily. 09/14/20   [provider]  OVER THE COUNTER MEDICATION Gallbladder supplement CBD oil q hs for sleeping    [provider]  predniSONE (DELTASONE) 50 MG  tablet Pt to take 50 mg of prednisone on 03/02/21 at 2:40 AM, 50 mg of prednisone on 03/02/21 at 8:40 AM, and 50 mg of prednisone on 03/02/21 at 2:40 PM. Pt is also to take 50 mg of benadryl on 03/02/21 at 2:40 PM. Please call [phone] with any questions. 03/01/21   Sebastian Ache, MD  Probiotic Product (PROBIOTIC-10 PO) Take 1 capsule by mouth daily.     [provider]  testosterone cypionate (DEPOTESTOSTERONE CYPIONATE) 200 MG/ML injection INJECT 0.6 MLS INTRAMUSCULARLY INTO LATERAL THIGH WEEKLY. 08/10/19   [provider]  triamterene-hydrochlorothiazide (DYAZIDE) 37.5-25 MG capsule Take 1 capsule by mouth daily.    [provider]    Physical Exam: Vitals:   03/03/21 1515 03/03/21 1530 03/03/21 1545 03/03/21 1600  BP: (!) 146/91 139/87  (!) 142/83  Pulse: (!) 107 (!) 107 (!) 107 (!) 106  Resp: 20 (!) 23 (!) 28 (!) 24  Temp:      TempSrc:      SpO2: 95% 96% 95% 96%    Constitutional: NAD, calm, comfortable  Vitals:   03/03/21 1515 03/03/21 1530 03/03/21 1545 03/03/21 1600  BP: (!) 146/91 139/87  (!) 142/83  Pulse: (!) 107 (!) 107 (!) 107 (!) 106  Resp: 20 (!) 23 (!) 28 (!) 24  Temp:      TempSrc:      SpO2: 95% 96% 95% 96%   Eyes: PERRL, lids and conjunctivae normal ENMT: Mucous membranes are moist.trachea midline  Neck: normal, supple, no masses, no thyromegaly Respiratory: normal resp effort, actively coughing, no audible wheezing Cardiovascular: Perfused, no notable JVD Abdomen: no tenderness, no masses palpated. No hepatosplenomegaly. Bowel sounds positive.  Musculoskeletal: no clubbing / cyanosis. No joint deformity upper and lower extremities. Good ROM, no contractures. Normal muscle tone.  Skin: no rashes, lesions Neurologic: CN 2-12 grossly intact. Sensation intact Psychiatric: Normal judgment and insight. Alert and oriented x 3. Normal mood.    Labs on Admission: I have personally reviewed following labs and imaging studies  CBC: Recent Labs   Lab 03/02/21 1138 03/03/21 1358  WBC 14.1* 17.6*  NEUTROABS 13.0* 15.4*  HGB 11.7* 12.4*  HCT 36.2* 38.4*  MCV 92.6 94.1  PLT 314 333   Basic Metabolic Panel: Recent Labs  Lab 03/02/21 1138 03/03/21 1358  NA 133* 136  K 3.7 4.0  CL 98 102  CO2 24 24  GLUCOSE 186* 99  BUN 21 28*  CREATININE 1.07 1.08  CALCIUM 8.7* 8.9   GFR: Estimated Creatinine Clearance: 74 mL/min (by C-G formula based on SCr of 1.08 mg/dL). Liver Function Tests: Recent Labs  Lab 03/02/21 1138 03/03/21 1358  AST 55* 39  ALT 85* 80*  ALKPHOS 243* 223*  BILITOT 0.9 0.8  PROT 7.5 7.6  ALBUMIN 3.0* 3.3*   Recent Labs  Lab 03/02/21 1138  LIPASE 27   No results for input(s): AMMONIA in the last 168 hours. Coagulation Profile: Recent Labs  Lab 03/02/21 1138 03/03/21 1515  INR 1.1 1.1   Cardiac Enzymes: Recent Labs  Lab 03/02/21 1138  CKTOTAL 14*  BNP (last 3 results) No results for input(s): PROBNP in the last 8760 hours. HbA1C: No results for input(s): HGBA1C in the last 72 hours. CBG: No results for input(s): GLUCAP in the last 168 hours. Lipid Profile: No results for input(s): CHOL, HDL, LDLCALC, TRIG, CHOLHDL, LDLDIRECT in the last 72 hours. Thyroid Function Tests: No results for input(s): TSH, T4TOTAL, FREET4, T3FREE, THYROIDAB in the last 72 hours. Anemia Panel: No results for input(s): VITAMINB12, FOLATE, FERRITIN, TIBC, IRON, RETICCTPCT in the last 72 hours. Urine analysis:    Component Value Date/Time   COLORURINE YELLOW 03/03/2021 1508   APPEARANCEUR CLEAR 03/03/2021 1508   LABSPEC 1.024 03/03/2021 1508   PHURINE 7.0 03/03/2021 1508   GLUCOSEU NEGATIVE 03/03/2021 1508   HGBUR NEGATIVE 03/03/2021 1508   BILIRUBINUR NEGATIVE 03/03/2021 1508   KETONESUR NEGATIVE 03/03/2021 1508   PROTEINUR NEGATIVE 03/03/2021 1508   UROBILINOGEN 0.2 08/01/2012 0225   NITRITE NEGATIVE 03/03/2021 1508   LEUKOCYTESUR NEGATIVE 03/03/2021 1508   Sepsis Labs:  !!!!!!!!!!!!!!!!!!!!!!!!!!!!!!!!!!!!!!!!!!!! (procalcitonin:4,lacticidven:4) ) Recent Results (from the past 240 hour(s))  Culture, blood (routine x 2)     Status: None (Preliminary result)   Collection Time: 03/02/21 11:50 AM   Specimen: BLOOD  Result Value Ref Range Status   Specimen Description   Final    BLOOD LEFT ANTECUBITAL Performed at [facility], [address] [place], [place] [address]    Special Requests   Final    BOTTLES DRAWN AEROBIC AND ANAEROBIC Blood Culture adequate volume Performed at [facility], [address] [place], [place] [address]    Culture  Setup Time   Final    GRAM POSITIVE COCCI IN BOTH AEROBIC AND ANAEROBIC BOTTLES CRITICAL VALUE NOTED.  VALUE IS CONSISTENT WITH PREVIOUSLY REPORTED AND CALLED VALUE. Performed at [facility], [address], [place], [place] [address]    Culture GRAM POSITIVE COCCI  Final   Report Status PENDING  Incomplete  Culture, blood (routine x 2)     Status: None (Preliminary result)   Collection Time: 03/02/21  1:10 PM   Specimen: Right Antecubital; Blood  Result Value Ref Range Status   Specimen Description   Final    RIGHT ANTECUBITAL BLOOD Performed at [facility], [address] [place], [place] [address]    Special Requests   Final    BOTTLES DRAWN AEROBIC AND ANAEROBIC Blood Culture adequate volume Performed at [facility], [address] [place], [place] [address]    Culture  Setup Time   Final    GRAM POSITIVE COCCI IN CHAINS IN BOTH AEROBIC AND ANAEROBIC BOTTLES CRITICAL RESULT CALLED TO, READ BACK BY AND VERIFIED WITH: S GOUGE RN  03/03/21 EB Performed at [facility], [address], [place], [place] [address]    Culture GRAM POSITIVE COCCI  Final   Report Status PENDING  Incomplete  Blood Culture ID Panel (Reflexed)     Status: Abnormal   Collection Time: 03/02/21  1:10 PM  Result Value Ref Range Status   Enterococcus  faecalis DETECTED (A) NOT DETECTED Final    Comment: CRITICAL RESULT CALLED TO, READ BACK BY AND VERIFIED WITH: S GOUGE RN  03/03/21 EB    Enterococcus Faecium NOT DETECTED NOT DETECTED Final   Listeria monocytogenes NOT DETECTED NOT DETECTED Final   Staphylococcus species NOT DETECTED NOT DETECTED Final   Staphylococcus aureus (BCID) NOT DETECTED NOT DETECTED Final   Staphylococcus epidermidis  NOT DETECTED NOT DETECTED Final   Staphylococcus lugdunensis NOT DETECTED NOT DETECTED Final   Streptococcus species NOT DETECTED NOT DETECTED Final   Streptococcus agalactiae NOT DETECTED NOT DETECTED Final   Streptococcus pneumoniae NOT DETECTED NOT DETECTED Final   Streptococcus pyogenes NOT DETECTED NOT DETECTED Final   A.calcoaceticus-baumannii NOT DETECTED NOT DETECTED Final   Bacteroides fragilis NOT DETECTED NOT DETECTED Final   Enterobacterales NOT DETECTED NOT DETECTED Final   Enterobacter cloacae complex NOT DETECTED NOT DETECTED Final   Escherichia coli NOT DETECTED NOT DETECTED Final   Klebsiella aerogenes NOT DETECTED NOT DETECTED Final   Klebsiella oxytoca NOT DETECTED NOT DETECTED Final   Klebsiella pneumoniae NOT DETECTED NOT DETECTED Final   Proteus species NOT DETECTED NOT DETECTED Final   Salmonella species NOT DETECTED NOT DETECTED Final   Serratia marcescens NOT DETECTED NOT DETECTED Final   Haemophilus influenzae NOT DETECTED NOT DETECTED Final   Neisseria meningitidis NOT DETECTED NOT DETECTED Final   Pseudomonas aeruginosa NOT DETECTED NOT DETECTED Final   Stenotrophomonas maltophilia NOT DETECTED NOT DETECTED Final   Candida albicans NOT DETECTED NOT DETECTED Final   Candida auris NOT DETECTED NOT DETECTED Final   Candida glabrata NOT DETECTED NOT DETECTED Final   Candida krusei NOT DETECTED NOT DETECTED Final   Candida parapsilosis NOT DETECTED NOT DETECTED Final   Candida tropicalis NOT DETECTED NOT DETECTED Final   Cryptococcus neoformans/gattii NOT DETECTED  NOT DETECTED Final   Vancomycin resistance NOT DETECTED NOT DETECTED Final    Comment: Performed at [facility], [address] [place], [place] [address]  Resp Panel by RT-PCR (Flu A&B, Covid) Nasopharyngeal Swab     Status: None   Collection Time: 03/02/21  3:41 PM   Specimen: Nasopharyngeal Swab; Nasopharyngeal(NP) swabs in vial transport medium  Result Value Ref Range Status   SARS Coronavirus 2 by RT PCR NEGATIVE NEGATIVE Final    Comment: (NOTE) SARS-CoV-2 target nucleic acids are NOT DETECTED.  The SARS-CoV-2 RNA is generally detectable in upper respiratory specimens during the acute phase of infection. The lowest concentration of SARS-CoV-2 viral copies this assay can detect is 138 copies/mL. A negative result does not preclude SARS-Cov-2 infection and should not be used as the sole basis for treatment or other patient management decisions. A negative result may occur with  improper specimen collection/handling, submission of specimen other than nasopharyngeal swab, presence of viral mutation(s) within the areas targeted by this assay, and inadequate number of viral copies(<138 copies/mL). A negative result must be combined with clinical observations, patient history, and epidemiological information. The expected result is Negative.  Fact Sheet for Patients:  BloggerCourse.com  Fact Sheet for Healthcare Providers:  SeriousBroker.it  This test is no t yet approved or cleared by the [place] FDA and  has been authorized for detection and/or diagnosis of SARS-CoV-2 by FDA under an Emergency Use Authorization (EUA). This EUA will remain  in effect (meaning this test can be used) for the duration of the COVID-19 declaration under Section 564(b)(1) of the Act, 21 U.S.C.section 360bbb-3(b)(1), unless the authorization is terminated  or revoked sooner.       Influenza A by PCR NEGATIVE NEGATIVE Final   Influenza B  by PCR NEGATIVE NEGATIVE Final    Comment: (NOTE) The Xpert Xpress SARS-CoV-2/FLU/RSV plus assay is intended as an aid in the diagnosis of influenza from Nasopharyngeal swab specimens and should not be used as a sole basis for treatment. Nasal washings and aspirates are unacceptable for  Xpert Xpress SARS-CoV-2/FLU/RSV testing.  Fact Sheet for Patients: BloggerCourse.com  Fact Sheet for Healthcare Providers: SeriousBroker.it  This test is not yet approved or cleared by the [place] FDA and has been authorized for detection and/or diagnosis of SARS-CoV-2 by FDA under an Emergency Use Authorization (EUA). This EUA will remain in effect (meaning this test can be used) for the duration of the COVID-19 declaration under Section 564(b)(1) of the Act, 21 U.S.C. section 360bbb-3(b)(1), unless the authorization is terminated or revoked.  Performed at [facility], [address] [place], [place] [address]      Radiological Exams on Admission: CT Angio Chest PE W/Cm &/Or Wo Cm  Result Date: 03/02/2021 CLINICAL DATA:  Fever, abdominal pain. EXAM: CT ANGIOGRAPHY CHEST CT ABDOMEN AND PELVIS WITH CONTRAST TECHNIQUE: Multidetector CT imaging of the chest was performed using the standard protocol during bolus administration of intravenous contrast. Multiplanar CT image reconstructions and MIPs were obtained to evaluate the vascular anatomy. Multidetector CT imaging of the abdomen and pelvis was performed using the standard protocol during bolus administration of intravenous contrast. Unfortunately, there is motion artifact during the abdominal scan, limiting these images. CONTRAST:  OMNIPAQUE IOHEXOL 350 MG/ML SOLN COMPARISON:  March 10, 2017. FINDINGS: CTA CHEST FINDINGS Cardiovascular: Satisfactory opacification of the pulmonary arteries to the segmental level. No evidence of pulmonary embolism. Normal heart size. No pericardial  effusion. Mediastinum/Nodes: Status post thyroidectomy. The esophagus is unremarkable. No adenopathy is noted. Lungs/Pleura: No pneumothorax or pleural effusion is noted. Right upper lobe airspace opacity is noted concerning for pneumonia. Musculoskeletal: No chest wall abnormality. No acute or significant osseous findings. Review of the MIP images confirms the above findings. CT ABDOMEN and PELVIS FINDINGS Hepatobiliary: No focal liver abnormality is seen. No gallstones, gallbladder wall thickening, or biliary dilatation. Pancreas: Unremarkable. No pancreatic ductal dilatation or surrounding inflammatory changes. Spleen: Normal in size without focal abnormality. Adrenals/Urinary Tract: Adrenal glands appear normal. Visualized portions of kidneys are unremarkable, although the lower poles are not well visualized due to patient motion artifact. No definite hydronephrosis or renal obstruction is noted. No definite renal or ureteral calculi are noted. Urinary bladder is unremarkable. Stomach/Bowel: Stomach is within normal limits. Appendix appears normal. No evidence of bowel wall thickening, distention, or inflammatory changes. Vascular/Lymphatic: No significant vascular findings are present. No enlarged abdominal or pelvic lymph nodes. Reproductive: Stable mild prostatic enlargement is noted. Other: Small fat containing right inguinal hernia is noted. No ascites is noted. Musculoskeletal: No acute or significant osseous findings. Review of the MIP images confirms the above findings. IMPRESSION: No definite evidence of pulmonary embolus. Probable right upper lobe pneumonia. Limited evaluation of the kidneys is noted due to patient motion artifact. Lower poles are not well visualized and therefore pathology in these areas cannot be excluded. No definite abnormality is seen involving the visualized portions of the kidneys. Small fat containing right inguinal hernia. Stable mild prostatic enlargement. Electronically  Signed   By: Lupita Raider M.D.   On: 03/02/2021 16:49   CT Abdomen Pelvis W Contrast  Result Date: 03/02/2021 CLINICAL DATA:  Fever, abdominal pain. EXAM: CT ANGIOGRAPHY CHEST CT ABDOMEN AND PELVIS WITH CONTRAST TECHNIQUE: Multidetector CT imaging of the chest was performed using the standard protocol during bolus administration of intravenous contrast. Multiplanar CT image reconstructions and MIPs were obtained to evaluate the vascular anatomy. Multidetector CT imaging of the abdomen and pelvis was performed using the standard protocol during bolus administration of intravenous contrast. Unfortunately, there  is motion artifact during the abdominal scan, limiting these images. CONTRAST:  OMNIPAQUE IOHEXOL 350 MG/ML SOLN COMPARISON:  March 10, 2017. FINDINGS: CTA CHEST FINDINGS Cardiovascular: Satisfactory opacification of the pulmonary arteries to the segmental level. No evidence of pulmonary embolism. Normal heart size. No pericardial effusion. Mediastinum/Nodes: Status post thyroidectomy. The esophagus is unremarkable. No adenopathy is noted. Lungs/Pleura: No pneumothorax or pleural effusion is noted. Right upper lobe airspace opacity is noted concerning for pneumonia. Musculoskeletal: No chest wall abnormality. No acute or significant osseous findings. Review of the MIP images confirms the above findings. CT ABDOMEN and PELVIS FINDINGS Hepatobiliary: No focal liver abnormality is seen. No gallstones, gallbladder wall thickening, or biliary dilatation. Pancreas: Unremarkable. No pancreatic ductal dilatation or surrounding inflammatory changes. Spleen: Normal in size without focal abnormality. Adrenals/Urinary Tract: Adrenal glands appear normal. Visualized portions of kidneys are unremarkable, although the lower poles are not well visualized due to patient motion artifact. No definite hydronephrosis or renal obstruction is noted. No definite renal or ureteral calculi are noted. Urinary bladder is  unremarkable. Stomach/Bowel: Stomach is within normal limits. Appendix appears normal. No evidence of bowel wall thickening, distention, or inflammatory changes. Vascular/Lymphatic: No significant vascular findings are present. No enlarged abdominal or pelvic lymph nodes. Reproductive: Stable mild prostatic enlargement is noted. Other: Small fat containing right inguinal hernia is noted. No ascites is noted. Musculoskeletal: No acute or significant osseous findings. Review of the MIP images confirms the above findings. IMPRESSION: No definite evidence of pulmonary embolus. Probable right upper lobe pneumonia. Limited evaluation of the kidneys is noted due to patient motion artifact. Lower poles are not well visualized and therefore pathology in these areas cannot be excluded. No definite abnormality is seen involving the visualized portions of the kidneys. Small fat containing right inguinal hernia. Stable mild prostatic enlargement. Electronically Signed   By: Lupita Raider M.D.   On: 03/02/2021 16:49   DG Chest Port 1 View  Result Date: 03/03/2021 CLINICAL DATA:  Fever. EXAM: PORTABLE CHEST 1 VIEW COMPARISON:  03/02/2021 FINDINGS: 1535 hours. Patchy airspace disease noted right upper lung and right base. Interstitial markings are diffusely coarsened with chronic features. Bones are diffusely demineralized. Telemetry leads overlie the chest. IMPRESSION: Similar appearance of patchy airspace disease right upper and lower lung compatible with pneumonia. Chronic interstitial changes. Electronically Signed   By: Kennith Center M.D.   On: 03/03/2021 16:03   DG Chest Port 1 View  Result Date: 03/02/2021 CLINICAL DATA:  Shortness of breath EXAM: PORTABLE CHEST 1 VIEW COMPARISON:  July 03, 2020 FINDINGS: There is diffuse interstitial thickening without frank edema or consolidation. Heart size and pulmonary vascularity are normal. No adenopathy. Evidence of prior trauma with postoperative fixation left clavicle.  Evidence of old healed fracture anterior right second rib. IMPRESSION: Diffuse interstitial thickening, likely representing a degree of chronic bronchitis. No edema or consolidation. Heart size normal. Electronically Signed   By: Bretta Bang III M.D.   On: 03/02/2021 12:13    EKG: Independently reviewed. Sinus tach  Assessment/Plan Active Problems:   Sepsis (HCC)   1. Enterococcus bacteremia and possible PNA with sepsis present on admit 1. Presented with fevers, leukocytosis, tachycardia in the setting on possible PNA on chest imaging as well as enterococcus bacteremia 2. ID consulted per EDP, appreciate recs 3. Currenlty continued on ampicillin, follow up on sensitivities 4. Repeat cbc in AM 2. Hypothyroid 1. Will check TSH 2. Continue home thyroid replacement 3. Prior hx of COVID pna 1. Noted  to have chronic residual fatigue and weakness 2. Pt is noted to be fully vaccinated  4. Normocytic anemia 1. Hgb trended down since 8/21, reviewed labs 2. Hemodynamically stable 3. Repeat CBC in AM  UPDATE: Patient's COVID test later became pos for COVID. On chart review, pt presented to ED in 8/21 for COVID but left because ED wait time. Pt was set up for outpt monoclonal ab infusion which pt did receive. Have ordered serial inflammatory markers, will keep in isolation. Given presenting pulmonary symptoms, will start remdesivir. Pt not hypoxemic on presentation, thus did not start steroids.  DVT prophylaxis: Lovenox subq  Code Status: Full Family Communication: Pt in room, family not at bedside  Disposition Plan:   Consults called: ID, Dr. Daiva [place]vesVan Dam Admission status: Inpatient, as it would likely require greater than 2 midnight stay to fully work up bacteremia and continued IV antibiotics as pt presesnts septic, placing pt at higher risk for mortality   Rickey BarbaraStephen Lagina Reader MD [facility] Pager On Amion  If 7PM-7AM, please contact night-coverage  03/03/2021, 4:27 PM

## 2021-03-03 NOTE — ED Triage Notes (Signed)
Emergency Medicine Provider Triage Evaluation Note  EION TIMBROOK , a 70 y.o. male  was evaluated in triage.  Pt complains of positive blood culture.  Patient was seen yesterday and had blood cultures drawn.  Patient endorses fever and nonproductive cough.  Review of Systems  Positive: Fever, nonproductive cough Negative: Chills, chest pain, breath, nausea, vomiting, abdominal pain  Physical Exam  BP (!) 146/74 (BP Location: Left Arm)   Pulse (!) 111   Temp 97.7 F (36.5 C) (Oral)   Resp 18   SpO2 99%  Gen:   Awake, no distress   HEENT:  Atraumatic  Resp:  Normal effort, able to speak in full complete sentences without difficulty, lungs clear to auscultation Cardiac:  Tachycardic at rate of 111 Abd:   Nondistended, nontender  MSK:   Moves extremities without difficulty  Neuro:  Speech clear   Medical Decision Making  Medically screening exam initiated at 1:26 PM.  Appropriate orders placed.  JEDREK DINOVO was informed that the remainder of the evaluation will be completed by another provider, this initial triage assessment does not replace that evaluation, and the importance of remaining in the ED until their evaluation is complete.  Clinical Impression   The patient appears stable so that the remainder of the work up may be completed by another provider.      Haskel Schroeder, [place] 03/03/21 1331

## 2021-03-04 ENCOUNTER — Inpatient Hospital Stay (HOSPITAL_COMMUNITY): Payer: Medicare Other

## 2021-03-04 DIAGNOSIS — R6520 Severe sepsis without septic shock: Secondary | ICD-10-CM

## 2021-03-04 DIAGNOSIS — I059 Rheumatic mitral valve disease, unspecified: Secondary | ICD-10-CM | POA: Diagnosis present

## 2021-03-04 DIAGNOSIS — A419 Sepsis, unspecified organism: Secondary | ICD-10-CM

## 2021-03-04 DIAGNOSIS — I38 Endocarditis, valve unspecified: Secondary | ICD-10-CM | POA: Diagnosis not present

## 2021-03-04 DIAGNOSIS — R7881 Bacteremia: Secondary | ICD-10-CM | POA: Diagnosis not present

## 2021-03-04 DIAGNOSIS — U071 COVID-19: Secondary | ICD-10-CM | POA: Diagnosis not present

## 2021-03-04 DIAGNOSIS — J9601 Acute respiratory failure with hypoxia: Secondary | ICD-10-CM

## 2021-03-04 DIAGNOSIS — J1282 Pneumonia due to coronavirus disease 2019: Secondary | ICD-10-CM

## 2021-03-04 DIAGNOSIS — B952 Enterococcus as the cause of diseases classified elsewhere: Secondary | ICD-10-CM | POA: Diagnosis not present

## 2021-03-04 LAB — ECHOCARDIOGRAM COMPLETE
Area-P 1/2: 3.21 cm2
Calc EF: 71.4 %
Height: 73 in
MV M vel: 4.2 m/s
MV Peak grad: 70.6 mmHg
MV VTI: 1.48 cm2
Radius: 0.8 cm
S' Lateral: 2.7 cm
Single Plane A2C EF: 76.3 %
Single Plane A4C EF: 63 %
Weight: 3008.84 oz

## 2021-03-04 LAB — COMPREHENSIVE METABOLIC PANEL
ALT: 55 U/L — ABNORMAL HIGH (ref 0–44)
AST: 23 U/L (ref 15–41)
Albumin: 2.6 g/dL — ABNORMAL LOW (ref 3.5–5.0)
Alkaline Phosphatase: 166 U/L — ABNORMAL HIGH (ref 38–126)
Anion gap: 8 (ref 5–15)
BUN: 23 mg/dL (ref 8–23)
CO2: 24 mmol/L (ref 22–32)
Calcium: 8.1 mg/dL — ABNORMAL LOW (ref 8.9–10.3)
Chloride: 103 mmol/L (ref 98–111)
Creatinine, Ser: 1.06 mg/dL (ref 0.61–1.24)
GFR, Estimated: >60 mL/min (ref >60)
Glucose, Bld: 95 mg/dL (ref 70–99)
Potassium: 3.6 mmol/L (ref 3.5–5.1)
Sodium: 135 mmol/L (ref 135–145)
Total Bilirubin: 0.6 mg/dL (ref 0.3–1.2)
Total Protein: 6.1 g/dL — ABNORMAL LOW (ref 6.5–8.1)

## 2021-03-04 LAB — CBC
HCT: 33.3 % — ABNORMAL LOW (ref 39.0–52.0)
Hemoglobin: 10.7 g/dL — ABNORMAL LOW (ref 13.0–17.0)
MCH: 30.4 pg (ref 26.0–34.0)
MCHC: 32.1 g/dL (ref 30.0–36.0)
MCV: 94.6 fL (ref 80.0–100.0)
Platelets: 296 10*3/uL (ref 150–400)
RBC: 3.52 MIL/uL — ABNORMAL LOW (ref 4.22–5.81)
RDW: 14 % (ref 11.5–15.5)
WBC: 12.6 10*3/uL — ABNORMAL HIGH (ref 4.0–10.5)
nRBC: 0 % (ref 0.0–0.2)

## 2021-03-04 LAB — HEPATITIS B SURFACE ANTIGEN: Hepatitis B Surface Ag: NONREACTIVE

## 2021-03-04 LAB — HIV ANTIBODY (ROUTINE TESTING W REFLEX)
HIV Screen 4th Generation wRfx: NONREACTIVE
HIV Screen 4th Generation wRfx: NONREACTIVE

## 2021-03-04 LAB — URINE CULTURE: Culture: NO GROWTH

## 2021-03-04 LAB — FERRITIN: Ferritin: 511 ng/mL — ABNORMAL HIGH (ref 24–336)

## 2021-03-04 LAB — HEPATITIS C ANTIBODY: HCV Ab: NONREACTIVE

## 2021-03-04 LAB — TSH: TSH: 2.55 u[IU]/mL (ref 0.350–4.500)

## 2021-03-04 LAB — HEPATITIS A ANTIBODY, TOTAL: hep A Total Ab: NONREACTIVE

## 2021-03-04 LAB — C-REACTIVE PROTEIN: CRP: 11.1 mg/dL — ABNORMAL HIGH (ref <1.0)

## 2021-03-04 MED ORDER — SODIUM CHLORIDE 0.9 % IV SOLN
100.0000 mg | Freq: Every day | INTRAVENOUS | Status: AC
  Administered 2021-03-05 – 2021-03-08 (×4): 100 mg via INTRAVENOUS
  Filled 2021-03-04 (×2): qty 20
  Filled 2021-03-04 (×2): qty 100

## 2021-03-04 MED ORDER — ORAL CARE MOUTH RINSE
15.0000 mL | Freq: Two times a day (BID) | OROMUCOSAL | Status: DC
  Administered 2021-03-05 – 2021-03-16 (×21): 15 mL via OROMUCOSAL

## 2021-03-04 MED ORDER — SODIUM CHLORIDE 0.9 % IV SOLN
INTRAVENOUS | Status: DC | PRN
  Administered 2021-03-04: 250 mL via INTRAVENOUS

## 2021-03-04 MED ORDER — SODIUM CHLORIDE 0.9 % IV SOLN
INTRAVENOUS | Status: DC | PRN
  Administered 2021-03-04 – 2021-03-14 (×2): 250 mL via INTRAVENOUS

## 2021-03-04 MED ORDER — ALBUTEROL SULFATE HFA 108 (90 BASE) MCG/ACT IN AERS
2.0000 | INHALATION_SPRAY | Freq: Four times a day (QID) | RESPIRATORY_TRACT | Status: DC | PRN
Start: 2021-03-04 — End: 2021-03-17
  Administered 2021-03-05 – 2021-03-13 (×11): 2 via RESPIRATORY_TRACT
  Filled 2021-03-04 (×2): qty 6.7

## 2021-03-04 MED ORDER — SODIUM CHLORIDE 0.9 % IV SOLN
200.0000 mg | Freq: Once | INTRAVENOUS | Status: AC
  Administered 2021-03-04: 200 mg via INTRAVENOUS
  Filled 2021-03-04 (×2): qty 40

## 2021-03-04 MED ORDER — SODIUM CHLORIDE 0.9 % IV BOLUS
1000.0000 mL | Freq: Once | INTRAVENOUS | Status: AC
  Administered 2021-03-04: 1000 mL via INTRAVENOUS

## 2021-03-04 MED ORDER — HYDROXYZINE HCL 25 MG PO TABS
25.0000 mg | ORAL_TABLET | Freq: Once | ORAL | Status: AC
  Administered 2021-03-04: 25 mg via ORAL
  Filled 2021-03-04: qty 1

## 2021-03-04 NOTE — Progress Notes (Signed)
   03/04/21 1631  Assess: MEWS Score  Temp 100.2 F (37.9 C)  BP 137/78  Pulse Rate (!) 123  ECG Heart Rate (!) 126  Resp (!) 29  Level of Consciousness Alert  SpO2 94 %  O2 Device Nasal Cannula  O2 Flow Rate (L/min) 5 L/min  Assess: MEWS Score  MEWS Temp 0  MEWS Systolic 0  MEWS Pulse 2  MEWS RR 2  MEWS LOC 0  MEWS Score 4  MEWS Score Color Red  Assess: if the MEWS score is Yellow or Red  Were vital signs taken at a resting state? Yes  Focused Assessment No change from prior assessment  Early Detection of Sepsis Score *See Row Information* Medium  MEWS guidelines implemented *See Row Information* No, previously red, continue vital signs every 4 hours  Treat  Pain Scale 0-10  Pain Score 0  Take Vital Signs  Increase Vital Sign Frequency  Red: Q 1hr X 4 then Q 4hr X 4, if remains red, continue Q 4hrs  Escalate  MEWS: Escalate Red: discuss with charge nurse/RN and provider, consider discussing with RRT  Notify: Charge Nurse/RN  Name of Charge Nurse/RN Notified Wilkie Aye RN  Date Charge Nurse/RN Notified 03/04/21  Time Charge Nurse/RN Notified 1631  Notify: Provider  Provider Name/Title Dr. Natale Milch  Date Provider Notified 03/04/21  Time Provider Notified 1700  Notification Type Page  Notification Reason Change in status  Provider response Other (Comment) (via secure chart)  Date of Provider Response 03/04/21  Time of Provider Response 1715

## 2021-03-04 NOTE — Progress Notes (Addendum)
PROGRESS NOTE    Brandon [name]  VEZ:[id number] DOB: 25-Jun-1951 DOA: 03/03/2021 PCP: Merryl Hacker, No   Brief Narrative:  Brandon [name] is a 70 y.o. male with medical history significant of prior covid pna, hypothyroid, HLD initially presented to ED on 4/8 with complaints of fevers, sob, nonproductive cough.  During workup, pt was found to have evidence suggesting RUL pna. Pt was recommended admission, however, pt declined and was ultimately discharged from the ED on augmentin and azithromycin. Blood cultures later returned pos for enterococcus and pt was asked to return to ED. Pt reports generalized weakness, increased cough, decreased exercise tolerance. Also reports fevers - in the ED, pt was found to be tachycardic with tachypnea. WBC noted to be 17.6. Blood cx from 4/8 confirmed enterococcus. ID was consulted and hospitalist requested for admission.   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  Patient to be transferred to [facility] for TEE as well as CT surgical evaluation.  Covid positive, notable pneumonia at intake, acute hypoxia  - Unclear if patient has acute Covid pneumonia versus bacterial pneumonia - Procalcitonin elevated but could be secondary to bacteremia and endocarditis.   - Patient is fully vaccinated, history of Covid August 2021 but did not require monoclonal antibody or inpatient treatment.   -Would suspect minimal disease in a vaccinated previously infected individual with omicron variant  -Continue Remdesivir for full 5-day course given symptoms and transient  hypoxia.  Currently holding steroids in the setting of above -however if respiratory symptoms were to worsen or oxygen needs were to escalate would consider placing patient on steroids -He does not qualify for any further treatment including but not limited to baricitinib/etc. in the setting of acute bacterial infection  Elevated AST/ALT/alk phos  -Appears to be somewhat chronic -unclear baseline -Downtrending since admission likely shock liver in the setting of sepsis -2018 labs markedly elevated (AST439/ALT760/AlkP230) -unremarkable work-up at that time; considered for outpatient follow-up for hemochromatosis versus autoimmune work-up but does not appear to be any documentation in our system that this was ever done  Hypothyroid - TSH 2.5 - Continue home thyroid replacement  Normocytic anemia - Likely hemodilutional, continue to follow repeat labs -No current signs or symptoms of active bleeding  DVT prophylaxis: Lovenox Code Status: Full Family Communication: None present  Status is: Inpatient  Dispo: The patient is from: Home              Anticipated d/c is to: Home              Anticipated d/c date is: > 72h               Patient currently not medically stable for discharge given ongoing need for further medical work-up, possible surgical intervention and IV antibiotics.  Consultants:   Infectious disease, cardiology  Will likely need to discuss case with CT surgery once TEE has resulted in the next 24 to 48 hours  Procedures:   TTE 03/04/2021  TEE tentatively planned for 1122  Antimicrobials:  Azithromycin -single dose 03/02/2021 Augmentin single dose 03/02/2021 Ampicillin 03/03/2021 -ongoing Ceftriaxone 2 g 03/03/21 -ongoing  Subjective: No acute issues or  events overnight, patient generally feels improved from admission but not yet back to baseline.  Somewhat somnolent this morning but easily arousable otherwise denies chest pain shortness of breath nausea vomiting  diarrhea constipation headache fevers or chills.  Objective: Vitals:   03/04/21 0535 03/04/21 0544 03/04/21 0608 03/04/21 0630  BP:   137/77   Pulse:   (!) 115   Resp: (!) 30 (!) 24  (!) 21  Temp:   (!) 100.5 F (38.1 C)   TempSrc:   Oral   SpO2: 95% 95% 95%   Weight:      Height:        Intake/Output Summary (Last 24 hours) at 03/04/2021 0754 Last data filed at 03/04/2021 0614 Gross per 24 hour  Intake 3583.31 ml  Output 650 ml  Net 2933.31 ml   Filed Weights   03/03/21 1835  Weight: 85.3 kg    Examination:  General:  Pleasantly resting in bed, No acute distress. HEENT:  Normocephalic atraumatic.  Sclerae nonicteric, noninjected.  Extraocular movements intact bilaterally. Neck:  Without mass or deformity.  Trachea is midline. Lungs:  Clear to auscultate bilaterally without rhonchi, wheeze, or rales. Heart:  Regular rate and rhythm.  Without overt murmur rub or gallop. Abdomen:  Soft, nontender, nondistended.  Without guarding or rebound. Extremities: Without cyanosis, clubbing, edema, or obvious deformity. Vascular:  Dorsalis pedis and posterior tibial pulses palpable bilaterally. Skin:  Warm and dry, no erythema, no ulcerations.  Data Reviewed: I have personally reviewed following labs and imaging studies  CBC: Recent Labs  Lab 03/02/21 1138 03/03/21 1358 03/03/21 1907  WBC 14.1* 17.6* 16.7*  NEUTROABS 13.0* 15.4*  --   HGB 11.7* 12.4* 11.9*  HCT 36.2* 38.4* 37.8*  MCV 92.6 94.1 95.2  PLT 314 333 761   Basic Metabolic Panel: Recent Labs  Lab 03/02/21 1138 03/03/21 1358 03/03/21 1907  NA 133* 136  --   K 3.7 4.0  --   CL 98 102  --   CO2 24 24  --   GLUCOSE 186* 99  --   BUN 21 28*  --   CREATININE 1.07 1.08 1.07  CALCIUM 8.7* 8.9  --    GFR: Estimated Creatinine Clearance: 74.7 mL/min (by C-G formula based on SCr of 1.07 mg/dL). Liver Function Tests: Recent Labs  Lab 03/02/21 1138 03/03/21 1358  AST 55* 39  ALT 85* 80*  ALKPHOS 243* 223*   BILITOT 0.9 0.8  PROT 7.5 7.6  ALBUMIN 3.0* 3.3*   Recent Labs  Lab 03/02/21 1138  LIPASE 27   No results for input(s): AMMONIA in the last 168 hours. Coagulation Profile: Recent Labs  Lab 03/02/21 1138 03/03/21 1515  INR 1.1 1.1   Cardiac Enzymes: Recent Labs  Lab 03/02/21 1138  CKTOTAL 14*   BNP (last 3 results) No results for input(s): PROBNP in the last 8760 hours. HbA1C: No results for input(s): HGBA1C in the last 72 hours. CBG: No results for input(s): GLUCAP in the last 168 hours. Lipid Profile: No results for input(s): CHOL, HDL, LDLCALC, TRIG, CHOLHDL, LDLDIRECT in the last 72 hours. Thyroid Function Tests: No results for input(s): TSH, T4TOTAL, FREET4, T3FREE, THYROIDAB in the last 72 hours. Anemia Panel: Recent Labs    03/03/21 1907  FERRITIN 672*   Sepsis Labs: Recent Labs  Lab 03/02/21 1138 03/03/21 1358  LATICACIDVEN 1.8 1.1    Recent Results (from the past 240 hour(s))  Culture, blood (routine x 2)     Status: None (  Preliminary result)   Collection Time: 03/02/21 11:50 AM   Specimen: BLOOD  Result Value Ref Range Status   Specimen Description   Final    BLOOD LEFT ANTECUBITAL Performed at [facility], [address] [place], [place] [address]    Special Requests   Final    BOTTLES DRAWN AEROBIC AND ANAEROBIC Blood Culture adequate volume Performed at [facility], [address] [place], [place] [address]    Culture  Setup Time   Final    GRAM POSITIVE COCCI IN BOTH AEROBIC AND ANAEROBIC BOTTLES CRITICAL VALUE NOTED.  VALUE IS CONSISTENT WITH PREVIOUSLY REPORTED AND CALLED VALUE. Performed at [facility], [place] [address] [place], [place] [address]    Culture GRAM POSITIVE COCCI  Final   Report Status PENDING  Incomplete  Culture, blood (routine x 2)     Status: None (Preliminary result)   Collection Time: 03/02/21  1:10 PM   Specimen: Right Antecubital; Blood  Result Value Ref Range Status    Specimen Description   Final    RIGHT ANTECUBITAL BLOOD Performed at [facility], [address] [place], [place] [address]    Special Requests   Final    BOTTLES DRAWN AEROBIC AND ANAEROBIC Blood Culture adequate volume Performed at [facility], [address] [place], [place] [address]    Culture  Setup Time   Final    GRAM POSITIVE COCCI IN CHAINS IN BOTH AEROBIC AND ANAEROBIC BOTTLES CRITICAL RESULT CALLED TO, READ BACK BY AND VERIFIED WITH: Josph Macho RN '@1205'  03/03/21 EB Performed at [facility], [place] [address] [place], [place] [address]    Culture GRAM POSITIVE COCCI  Final   Report Status PENDING  Incomplete  Blood Culture ID Panel (Reflexed)     Status: Abnormal   Collection Time: 03/02/21  1:10 PM  Result Value Ref Range Status   Enterococcus faecalis DETECTED (A) NOT DETECTED Final    Comment: CRITICAL RESULT CALLED TO, READ BACK BY AND VERIFIED WITH: S GOUGE RN '@1203'  03/03/21 EB    Enterococcus Faecium NOT DETECTED NOT DETECTED Final   Listeria monocytogenes NOT DETECTED NOT DETECTED Final   Staphylococcus species NOT DETECTED NOT DETECTED Final   Staphylococcus aureus (BCID) NOT DETECTED NOT DETECTED Final   Staphylococcus epidermidis NOT DETECTED NOT DETECTED Final   Staphylococcus lugdunensis NOT DETECTED NOT DETECTED Final   Streptococcus species NOT DETECTED NOT DETECTED Final   Streptococcus agalactiae NOT DETECTED NOT DETECTED Final   Streptococcus pneumoniae NOT DETECTED NOT DETECTED Final   Streptococcus pyogenes NOT DETECTED NOT DETECTED Final   A.calcoaceticus-baumannii NOT DETECTED NOT DETECTED Final   Bacteroides fragilis NOT DETECTED NOT DETECTED Final   Enterobacterales NOT DETECTED NOT DETECTED Final   Enterobacter cloacae complex NOT DETECTED NOT DETECTED Final   Escherichia coli NOT DETECTED NOT DETECTED Final   Klebsiella aerogenes NOT DETECTED NOT DETECTED Final   Klebsiella oxytoca NOT DETECTED NOT DETECTED Final    Klebsiella pneumoniae NOT DETECTED NOT DETECTED Final   Proteus species NOT DETECTED NOT DETECTED Final   Salmonella species NOT DETECTED NOT DETECTED Final   Serratia marcescens NOT DETECTED NOT DETECTED Final   Haemophilus influenzae NOT DETECTED NOT DETECTED Final   Neisseria meningitidis NOT DETECTED NOT DETECTED Final   Pseudomonas aeruginosa NOT DETECTED NOT DETECTED Final   Stenotrophomonas maltophilia NOT DETECTED NOT DETECTED Final   Candida albicans NOT DETECTED NOT DETECTED Final  Candida auris NOT DETECTED NOT DETECTED Final   Candida glabrata NOT DETECTED NOT DETECTED Final   Candida krusei NOT DETECTED NOT DETECTED Final   Candida parapsilosis NOT DETECTED NOT DETECTED Final   Candida tropicalis NOT DETECTED NOT DETECTED Final   Cryptococcus neoformans/gattii NOT DETECTED NOT DETECTED Final   Vancomycin resistance NOT DETECTED NOT DETECTED Final    Comment: Performed at [facility] [address], [place], [place] [address]  Resp Panel by RT-PCR (Flu A&B, Covid) Nasopharyngeal Swab     Status: None   Collection Time: 03/02/21  3:41 PM   Specimen: Nasopharyngeal Swab; Nasopharyngeal(NP) swabs in vial transport medium  Result Value Ref Range Status   SARS Coronavirus 2 by RT PCR NEGATIVE NEGATIVE Final    Comment: (NOTE) SARS-CoV-2 target nucleic acids are NOT DETECTED.  The SARS-CoV-2 RNA is generally detectable in upper respiratory specimens during the acute phase of infection. The lowest concentration of SARS-CoV-2 viral copies this assay can detect is 138 copies/mL. A negative result does not preclude SARS-Cov-2 infection and should not be used as the sole basis for treatment or other patient management decisions. A negative result may occur with  improper specimen collection/handling, submission of specimen other than nasopharyngeal swab, presence of viral mutation(s) within the areas targeted by this assay, and inadequate number of viral copies(<138  copies/mL). A negative result must be combined with clinical observations, patient history, and epidemiological information. The expected result is Negative.  Fact Sheet for Patients:  EntrepreneurPulse.com.au  Fact Sheet for Healthcare Providers:  IncredibleEmployment.be  This test is no t yet approved or cleared by the [place] FDA and  has been authorized for detection and/or diagnosis of SARS-CoV-2 by FDA under an Emergency Use Authorization (EUA). This EUA will remain  in effect (meaning this test can be used) for the duration of the COVID-19 declaration under Section 564(b)(1) of the Act, 21 U.S.C.section 360bbb-3(b)(1), unless the authorization is terminated  or revoked sooner.       Influenza A by PCR NEGATIVE NEGATIVE Final   Influenza B by PCR NEGATIVE NEGATIVE Final    Comment: (NOTE) The Xpert Xpress SARS-CoV-2/FLU/RSV plus assay is intended as an aid in the diagnosis of influenza from Nasopharyngeal swab specimens and should not be used as a sole basis for treatment. Nasal washings and aspirates are unacceptable for Xpert Xpress SARS-CoV-2/FLU/RSV testing.  Fact Sheet for Patients: EntrepreneurPulse.com.au  Fact Sheet for Healthcare Providers: IncredibleEmployment.be  This test is not yet approved or cleared by the [place] FDA and has been authorized for detection and/or diagnosis of SARS-CoV-2 by FDA under an Emergency Use Authorization (EUA). This EUA will remain in effect (meaning this test can be used) for the duration of the COVID-19 declaration under Section 564(b)(1) of the Act, 21 U.S.C. section 360bbb-3(b)(1), unless the authorization is terminated or revoked.  Performed at [facility], [address] [place], [place] [address]   Resp Panel by RT-PCR (Flu A&B, Covid) Nasopharyngeal Swab     Status: Abnormal   Collection Time: 03/03/21  3:08 PM   Specimen:  Nasopharyngeal Swab; Nasopharyngeal(NP) swabs in vial transport medium  Result Value Ref Range Status   SARS Coronavirus 2 by RT PCR POSITIVE (A) NEGATIVE Final    Comment: RESULT CALLED TO, READ BACK BY AND VERIFIED WITH: BOWEN,M. RN AT 1628 03/03/21 MULLINS,T (NOTE) SARS-CoV-2 target nucleic acids are DETECTED.  The SARS-CoV-2 RNA is generally detectable in upper respiratory specimens during the acute phase  of infection. Positive results are indicative of the presence of the identified virus, but do not rule out bacterial infection or co-infection with other pathogens not detected by the test. Clinical correlation with patient history and other diagnostic information is necessary to determine patient infection status. The expected result is Negative.  Fact Sheet for Patients: EntrepreneurPulse.com.au  Fact Sheet for Healthcare Providers: IncredibleEmployment.be  This test is not yet approved or cleared by the [place] FDA and  has been authorized for detection and/or diagnosis of SARS-CoV-2 by FDA under an Emergency Use Authorization (EUA).  This EUA will remain in effect (meaning this test c an be used) for the duration of  the COVID-19 declaration under Section 564(b)(1) of the Act, 21 U.S.C. section 360bbb-3(b)(1), unless the authorization is terminated or revoked sooner.     Influenza A by PCR NEGATIVE NEGATIVE Final   Influenza B by PCR NEGATIVE NEGATIVE Final    Comment: (NOTE) The Xpert Xpress SARS-CoV-2/FLU/RSV plus assay is intended as an aid in the diagnosis of influenza from Nasopharyngeal swab specimens and should not be used as a sole basis for treatment. Nasal washings and aspirates are unacceptable for Xpert Xpress SARS-CoV-2/FLU/RSV testing.  Fact Sheet for Patients: EntrepreneurPulse.com.au  Fact Sheet for Healthcare Providers: IncredibleEmployment.be  This test is not yet  approved or cleared by the [place] FDA and has been authorized for detection and/or diagnosis of SARS-CoV-2 by FDA under an Emergency Use Authorization (EUA). This EUA will remain in effect (meaning this test can be used) for the duration of the COVID-19 declaration under Section 564(b)(1) of the Act, 21 U.S.C. section 360bbb-3(b)(1), unless the authorization is terminated or revoked.  Performed at [facility], [place] [address], [place], [place] [address]          Radiology Studies: CT Angio Chest PE W/Cm &/Or Wo Cm  Result Date: 03/02/2021 CLINICAL DATA:  Fever, abdominal pain. EXAM: CT ANGIOGRAPHY CHEST CT ABDOMEN AND PELVIS WITH CONTRAST TECHNIQUE: Multidetector CT imaging of the chest was performed using the standard protocol during bolus administration of intravenous contrast. Multiplanar CT image reconstructions and MIPs were obtained to evaluate the vascular anatomy. Multidetector CT imaging of the abdomen and pelvis was performed using the standard protocol during bolus administration of intravenous contrast. Unfortunately, there is motion artifact during the abdominal scan, limiting these images. CONTRAST:  151m OMNIPAQUE IOHEXOL 350 MG/ML SOLN COMPARISON:  March 10, 2017. FINDINGS: CTA CHEST FINDINGS Cardiovascular: Satisfactory opacification of the pulmonary arteries to the segmental level. No evidence of pulmonary embolism. Normal heart size. No pericardial effusion. Mediastinum/Nodes: Status post thyroidectomy. The esophagus is unremarkable. No adenopathy is noted. Lungs/Pleura: No pneumothorax or pleural effusion is noted. Right upper lobe airspace opacity is noted concerning for pneumonia. Musculoskeletal: No chest wall abnormality. No acute or significant osseous findings. Review of the MIP images confirms the above findings. CT ABDOMEN and PELVIS FINDINGS Hepatobiliary: No focal liver abnormality is seen. No gallstones, gallbladder wall thickening, or  biliary dilatation. Pancreas: Unremarkable. No pancreatic ductal dilatation or surrounding inflammatory changes. Spleen: Normal in size without focal abnormality. Adrenals/Urinary Tract: Adrenal glands appear normal. Visualized portions of kidneys are unremarkable, although the lower poles are not well visualized due to patient motion artifact. No definite hydronephrosis or renal obstruction is noted. No definite renal or ureteral calculi are noted. Urinary bladder is unremarkable. Stomach/Bowel: Stomach is within normal limits. Appendix appears normal. No evidence of bowel wall thickening, distention, or inflammatory changes. Vascular/Lymphatic: No significant vascular findings are  present. No enlarged abdominal or pelvic lymph nodes. Reproductive: Stable mild prostatic enlargement is noted. Other: Small fat containing right inguinal hernia is noted. No ascites is noted. Musculoskeletal: No acute or significant osseous findings. Review of the MIP images confirms the above findings. IMPRESSION: No definite evidence of pulmonary embolus. Probable right upper lobe pneumonia. Limited evaluation of the kidneys is noted due to patient motion artifact. Lower poles are not well visualized and therefore pathology in these areas cannot be excluded. No definite abnormality is seen involving the visualized portions of the kidneys. Small fat containing right inguinal hernia. Stable mild prostatic enlargement. Electronically Signed   By: Marijo Conception M.D.   On: 03/02/2021 16:49   CT Abdomen Pelvis W Contrast  Result Date: 03/02/2021 CLINICAL DATA:  Fever, abdominal pain. EXAM: CT ANGIOGRAPHY CHEST CT ABDOMEN AND PELVIS WITH CONTRAST TECHNIQUE: Multidetector CT imaging of the chest was performed using the standard protocol during bolus administration of intravenous contrast. Multiplanar CT image reconstructions and MIPs were obtained to evaluate the vascular anatomy. Multidetector CT imaging of the abdomen and pelvis was  performed using the standard protocol during bolus administration of intravenous contrast. Unfortunately, there is motion artifact during the abdominal scan, limiting these images. CONTRAST:  183m OMNIPAQUE IOHEXOL 350 MG/ML SOLN COMPARISON:  March 10, 2017. FINDINGS: CTA CHEST FINDINGS Cardiovascular: Satisfactory opacification of the pulmonary arteries to the segmental level. No evidence of pulmonary embolism. Normal heart size. No pericardial effusion. Mediastinum/Nodes: Status post thyroidectomy. The esophagus is unremarkable. No adenopathy is noted. Lungs/Pleura: No pneumothorax or pleural effusion is noted. Right upper lobe airspace opacity is noted concerning for pneumonia. Musculoskeletal: No chest wall abnormality. No acute or significant osseous findings. Review of the MIP images confirms the above findings. CT ABDOMEN and PELVIS FINDINGS Hepatobiliary: No focal liver abnormality is seen. No gallstones, gallbladder wall thickening, or biliary dilatation. Pancreas: Unremarkable. No pancreatic ductal dilatation or surrounding inflammatory changes. Spleen: Normal in size without focal abnormality. Adrenals/Urinary Tract: Adrenal glands appear normal. Visualized portions of kidneys are unremarkable, although the lower poles are not well visualized due to patient motion artifact. No definite hydronephrosis or renal obstruction is noted. No definite renal or ureteral calculi are noted. Urinary bladder is unremarkable. Stomach/Bowel: Stomach is within normal limits. Appendix appears normal. No evidence of bowel wall thickening, distention, or inflammatory changes. Vascular/Lymphatic: No significant vascular findings are present. No enlarged abdominal or pelvic lymph nodes. Reproductive: Stable mild prostatic enlargement is noted. Other: Small fat containing right inguinal hernia is noted. No ascites is noted. Musculoskeletal: No acute or significant osseous findings. Review of the MIP images confirms the above  findings. IMPRESSION: No definite evidence of pulmonary embolus. Probable right upper lobe pneumonia. Limited evaluation of the kidneys is noted due to patient motion artifact. Lower poles are not well visualized and therefore pathology in these areas cannot be excluded. No definite abnormality is seen involving the visualized portions of the kidneys. Small fat containing right inguinal hernia. Stable mild prostatic enlargement. Electronically Signed   By: JMarijo ConceptionM.D.   On: 03/02/2021 16:49   DG Chest Port 1 View  Result Date: 03/03/2021 CLINICAL DATA:  Fever. EXAM: PORTABLE CHEST 1 VIEW COMPARISON:  03/02/2021 FINDINGS: 1535 hours. Patchy airspace disease noted right upper lung and right base. Interstitial markings are diffusely coarsened with chronic features. Bones are diffusely demineralized. Telemetry leads overlie the chest. IMPRESSION: Similar appearance of patchy airspace disease right upper and lower lung compatible with pneumonia. Chronic interstitial changes.  Electronically Signed   By: Misty Stanley M.D.   On: 03/03/2021 16:03   DG Chest Port 1 View  Result Date: 03/02/2021 CLINICAL DATA:  Shortness of breath EXAM: PORTABLE CHEST 1 VIEW COMPARISON:  July 03, 2020 FINDINGS: There is diffuse interstitial thickening without frank edema or consolidation. Heart size and pulmonary vascularity are normal. No adenopathy. Evidence of prior trauma with postoperative fixation left clavicle. Evidence of old healed fracture anterior right second rib. IMPRESSION: Diffuse interstitial thickening, likely representing a degree of chronic bronchitis. No edema or consolidation. Heart size normal. Electronically Signed   By: Lowella Grip III M.D.   On: 03/02/2021 12:13        Scheduled Meds: . albuterol  2 puff Inhalation Q6H  . vitamin C  500 mg Oral Daily  . cholecalciferol  1,000 Units Oral Daily  . enoxaparin (LOVENOX) injection  40 mg Subcutaneous Q24H  . levothyroxine  175 mcg Oral QAC  breakfast  . zinc sulfate  220 mg Oral Daily   Continuous Infusions: . sodium chloride 75 mL/hr at 03/03/21 1854  . ampicillin (OMNIPEN) IV 2 g (03/04/21 0501)  . cefTRIAXone (ROCEPHIN)  IV 2 g (03/03/21 2001)  . lactated ringers 150 mL/hr at 03/03/21 1549  . remdesivir 200 mg in sodium chloride 0.9% 250 mL IVPB     Followed by  . [START ON 03/05/2021] remdesivir 100 mg in NS 100 mL       LOS: 1 day   Time spent: 3mn  Damen Windsor C Maximo Spratling, DO [facility]  If 7PM-7AM, please contact night-coverage [email or web address]  03/04/2021, 7:54 AM

## 2021-03-04 NOTE — Progress Notes (Signed)
   03/04/21 0803  Assess: MEWS Score  Temp 99.3 F (37.4 C)  BP 122/73  Pulse Rate (!) 106  Resp (!) 24  SpO2 94 %  O2 Device Nasal Cannula  O2 Flow Rate (L/min) 3 L/min  Assess: MEWS Score  MEWS Temp 0  MEWS Systolic 0  MEWS Pulse 1  MEWS RR 1  MEWS LOC 0  MEWS Score 2  MEWS Score Color Yellow  Treat  Pain Scale 0-10  Pain Score 0  Take Vital Signs  Increase Vital Sign Frequency  Yellow: Q 2hr X 2 then Q 4hr X 2, if remains yellow, continue Q 4hrs  Escalate  MEWS: Escalate Yellow: discuss with charge nurse/RN and consider discussing with provider and RRT  Notify: Charge Nurse/RN  Name of Charge Nurse/RN Notified Shearon Stalls, RN  Date Charge Nurse/RN Notified 03/04/21  Time Charge Nurse/RN Notified 0900  Charge nurse notified, Will continue to monitor

## 2021-03-04 NOTE — Progress Notes (Signed)
Subjective: He is coughing more today than he was yesterday.   Antibiotics:  Anti-infectives (From admission, onward)   Start     Dose/Rate Route Frequency Ordered Stop   03/05/21 1000  remdesivir 100 mg in sodium chloride 0.9 % 100 mL IVPB       "Followed by" Linked Group Details   100 mg 200 mL/hr over 30 Minutes Intravenous Daily 03/04/21 0035 03/09/21 0959   03/04/21 1000  remdesivir 100 mg in sodium chloride 0.9 % 100 mL IVPB  Status:  Discontinued       "Followed by" Linked Group Details   100 mg 200 mL/hr over 30 Minutes Intravenous Daily 03/03/21 1806 03/04/21 0035   03/04/21 1000  remdesivir 200 mg in sodium chloride 0.9% 250 mL IVPB       "Followed by" Linked Group Details   200 mg 580 mL/hr over 30 Minutes Intravenous Once 03/04/21 0035 03/04/21 1230   03/03/21 2000  cefTRIAXone (ROCEPHIN) 2 g in sodium chloride 0.9 % 100 mL IVPB        2 g 200 mL/hr over 30 Minutes Intravenous Every 12 hours 03/03/21 1708     03/03/21 2000  remdesivir 200 mg in sodium chloride 0.9% 250 mL IVPB  Status:  Discontinued       "Followed by" Linked Group Details   200 mg 580 mL/hr over 30 Minutes Intravenous Once 03/03/21 1806 03/04/21 0035   03/03/21 1600  ampicillin (OMNIPEN) 2 g in sodium chloride 0.9 % 100 mL IVPB        2 g 300 mL/hr over 20 Minutes Intravenous Every 4 hours 03/03/21 1525        Medications: Scheduled Meds: . albuterol  2 puff Inhalation Q6H  . vitamin C  500 mg Oral Daily  . cholecalciferol  1,000 Units Oral Daily  . enoxaparin (LOVENOX) injection  40 mg Subcutaneous Q24H  . levothyroxine  175 mcg Oral QAC breakfast  . zinc sulfate  220 mg Oral Daily   Continuous Infusions: . sodium chloride 75 mL/hr at 03/04/21 1157  . ampicillin (OMNIPEN) IV 2 g (03/04/21 1323)  . cefTRIAXone (ROCEPHIN)  IV 2 g (03/04/21 0841)  . [START ON 03/05/2021] remdesivir 100 mg in NS 100 mL     PRN Meds:.acetaminophen **OR** acetaminophen, chlorpheniramine-HYDROcodone,  fluticasone, guaiFENesin-dextromethorphan, hydrALAZINE    Objective: Weight change:   Intake/Output Summary (Last 24 hours) at 03/04/2021 1516 Last data filed at 03/04/2021 1201 Gross per 24 hour  Intake 3583.31 ml  Output 850 ml  Net 2733.31 ml   Blood pressure 138/78, pulse (!) 114, temperature 98.9 F (37.2 C), temperature source Oral, resp. rate 20, height  (1.854 m), weight 85.3 kg, SpO2 96 %. Temp:  [98.2 F (36.8 C)-100.5 F (38.1 C)] 98.9 F (37.2 C) (04/10 1258) Pulse Rate:  [106-116] 114 (04/10 1258) Resp:  [20-30] 20 (04/10 1258) BP: (122-148)/(73-87) 138/78 (04/10 1258) SpO2:  [87 %-96 %] 96 % (04/10 1258) Weight:  [85.3 kg] 85.3 kg (04/09 1835)  Physical Exam: Physical Exam Constitutional:      Appearance: He is well-developed.  HENT:     Head: Normocephalic and atraumatic.  Eyes:     Conjunctiva/sclera: Conjunctivae normal.  Cardiovascular:     Rate and Rhythm: Normal rate and regular rhythm.     Heart sounds: Murmur heard.    Pulmonary:     Effort: Pulmonary effort is normal. No respiratory distress.     Breath sounds:  No stridor. No wheezing or rhonchi.  Abdominal:     General: There is no distension.     Palpations: Abdomen is soft. There is no mass.  Musculoskeletal:        General: Normal range of motion.     Cervical back: Normal range of motion and neck supple.  Skin:    General: Skin is warm and dry.     Findings: No erythema or rash.  Neurological:     General: No focal deficit present.     Mental Status: He is alert and oriented to person, place, and time.  Psychiatric:        Mood and Affect: Mood normal.        Behavior: Behavior normal.        Thought Content: Thought content normal.        Judgment: Judgment normal.      CBC:    BMET Recent Labs    03/03/21 1358 03/03/21 1907 03/04/21 0737  NA 136  --  135  K 4.0  --  3.6  CL 102  --  103  CO2 24  --  24  GLUCOSE 99  --  95  BUN 28*  --  23  CREATININE 1.08  1.07 1.06  CALCIUM 8.9  --  8.1*     Liver Panel  Recent Labs    03/03/21 1358 03/04/21 0737  PROT 7.6 6.1*  ALBUMIN 3.3* 2.6*  AST 39 23  ALT 80* 55*  ALKPHOS 223* 166*  BILITOT 0.8 0.6       Sedimentation Rate Recent Labs    03/02/21 1138  ESRSEDRATE 129*   C-Reactive Protein Recent Labs    03/03/21 1907 03/04/21 0737  CRP 9.2* 11.1*    Micro Results: Recent Results (from the past 720 hour(s))  Culture, blood (routine x 2)     Status: Abnormal (Preliminary result)   Collection Time: 03/02/21 11:50 AM   Specimen: BLOOD  Result Value Ref Range Status   Specimen Description   Final    BLOOD LEFT ANTECUBITAL Performed at [facility], [address] [place], [place] [address]    Special Requests   Final    BOTTLES DRAWN AEROBIC AND ANAEROBIC Blood Culture adequate volume Performed at [facility], [address] [place], [place] [address]    Culture  Setup Time   Final    GRAM POSITIVE COCCI IN BOTH AEROBIC AND ANAEROBIC BOTTLES CRITICAL VALUE NOTED.  VALUE IS CONSISTENT WITH PREVIOUSLY REPORTED AND CALLED VALUE. Performed at [facility], [address] [place], [place] [address]    Culture ENTEROCOCCUS FAECALIS (A)  Final   Report Status PENDING  Incomplete  Culture, blood (routine x 2)     Status: Abnormal (Preliminary result)   Collection Time: 03/02/21  1:10 PM   Specimen: Right Antecubital; Blood  Result Value Ref Range Status   Specimen Description   Final    RIGHT ANTECUBITAL BLOOD Performed at [facility], [address] [place], [place] [address]    Special Requests   Final    BOTTLES DRAWN AEROBIC AND ANAEROBIC Blood Culture adequate volume Performed at [facility], [address] [place], [place] [address]    Culture  Setup Time   Final    GRAM POSITIVE COCCI IN CHAINS IN BOTH AEROBIC AND ANAEROBIC BOTTLES CRITICAL RESULT CALLED TO, READ BACK BY AND VERIFIED WITH: Heywood Bene RN  @  1205 03/03/21 EB Performed at [facility], [address], [place], [place] [address]    Culture ENTEROCOCCUS FAECALIS (A)  Final   Report Status PENDING  Incomplete  Blood Culture ID Panel (Reflexed)     Status: Abnormal   Collection Time: 03/02/21  1:10 PM  Result Value Ref Range Status   Enterococcus faecalis DETECTED (A) NOT DETECTED Final    Comment: CRITICAL RESULT CALLED TO, READ BACK BY AND VERIFIED WITH: S GOUGE RN @1203  03/03/21 EB    Enterococcus Faecium NOT DETECTED NOT DETECTED Final   Listeria monocytogenes NOT DETECTED NOT DETECTED Final   Staphylococcus species NOT DETECTED NOT DETECTED Final   Staphylococcus aureus (BCID) NOT DETECTED NOT DETECTED Final   Staphylococcus epidermidis NOT DETECTED NOT DETECTED Final   Staphylococcus lugdunensis NOT DETECTED NOT DETECTED Final   Streptococcus species NOT DETECTED NOT DETECTED Final   Streptococcus agalactiae NOT DETECTED NOT DETECTED Final   Streptococcus pneumoniae NOT DETECTED NOT DETECTED Final   Streptococcus pyogenes NOT DETECTED NOT DETECTED Final   A.calcoaceticus-baumannii NOT DETECTED NOT DETECTED Final   Bacteroides fragilis NOT DETECTED NOT DETECTED Final   Enterobacterales NOT DETECTED NOT DETECTED Final   Enterobacter cloacae complex NOT DETECTED NOT DETECTED Final   Escherichia coli NOT DETECTED NOT DETECTED Final   Klebsiella aerogenes NOT DETECTED NOT DETECTED Final   Klebsiella oxytoca NOT DETECTED NOT DETECTED Final   Klebsiella pneumoniae NOT DETECTED NOT DETECTED Final   Proteus species NOT DETECTED NOT DETECTED Final   Salmonella species NOT DETECTED NOT DETECTED Final   Serratia marcescens NOT DETECTED NOT DETECTED Final   Haemophilus influenzae NOT DETECTED NOT DETECTED Final   Neisseria meningitidis NOT DETECTED NOT DETECTED Final   Pseudomonas aeruginosa NOT DETECTED NOT DETECTED Final   Stenotrophomonas maltophilia NOT DETECTED NOT DETECTED Final   Candida albicans NOT DETECTED NOT  DETECTED Final   Candida auris NOT DETECTED NOT DETECTED Final   Candida glabrata NOT DETECTED NOT DETECTED Final   Candida krusei NOT DETECTED NOT DETECTED Final   Candida parapsilosis NOT DETECTED NOT DETECTED Final   Candida tropicalis NOT DETECTED NOT DETECTED Final   Cryptococcus neoformans/gattii NOT DETECTED NOT DETECTED Final   Vancomycin resistance NOT DETECTED NOT DETECTED Final    Comment: Performed at [facility], [address] [place], [place]  Resp Panel by RT-PCR (Flu A&B, Covid) Nasopharyngeal Swab     Status: None   Collection Time: 03/02/21  3:41 PM   Specimen: Nasopharyngeal Swab; Nasopharyngeal(NP) swabs in vial transport medium  Result Value Ref Range Status   SARS Coronavirus 2 by RT PCR NEGATIVE NEGATIVE Final    Comment: (NOTE) SARS-CoV-2 target nucleic acids are NOT DETECTED.  The SARS-CoV-2 RNA is generally detectable in upper respiratory specimens during the acute phase of infection. The lowest concentration of SARS-CoV-2 viral copies this assay can detect is 138 copies/mL. A negative result does not preclude SARS-Cov-2 infection and should not be used as the sole basis for treatment or other patient management decisions. A negative result may occur with  improper specimen collection/handling, submission of specimen other than nasopharyngeal swab, presence of viral mutation(s) within the areas targeted by this assay, and inadequate number of viral copies(<138 copies/mL). A negative result must be combined with clinical observations, patient history, and epidemiological information. The expected result is Negative.  Fact Sheet for Patients:  05/02/21  Fact Sheet for Healthcare Providers:  BloggerCourse.com  This test is no t yet approved or  cleared by the [place] and  has been authorized for detection and/or diagnosis of SARS-CoV-2 by FDA under an Emergency Use  Authorization (EUA). This EUA will remain  in effect (meaning this test can be used) for the duration of the COVID-19 declaration under Section 564(b)(1) of the Act, 21 U.S.C.section 360bbb-3(b)(1), unless the authorization is terminated  or revoked sooner.       Influenza A by PCR NEGATIVE NEGATIVE Final   Influenza B by PCR NEGATIVE NEGATIVE Final    Comment: (NOTE) The Xpert Xpress SARS-CoV-2/FLU/RSV plus assay is intended as an aid in the diagnosis of influenza from Nasopharyngeal swab specimens and should not be used as a sole basis for treatment. Nasal washings and aspirates are unacceptable for Xpert Xpress SARS-CoV-2/FLU/RSV testing.  Fact Sheet for Patients: BloggerCourse.com  Fact Sheet for Healthcare Providers: SeriousBroker.it  This test is not yet approved or cleared by the [place] FDA and has been authorized for detection and/or diagnosis of SARS-CoV-2 by FDA under an Emergency Use Authorization (EUA). This EUA will remain in effect (meaning this test can be used) for the duration of the COVID-19 declaration under Section 564(b)(1) of the Act, 21 U.S.C. section 360bbb-3(b)(1), unless the authorization is terminated or revoked.  Performed at [facility], [address] [place], [place] [address]   Blood culture (routine x 2)     Status: None (Preliminary result)   Collection Time: 03/03/21  1:58 PM   Specimen: BLOOD  Result Value Ref Range Status   Specimen Description   Final    BLOOD LEFT ANTECUBITAL Performed at [facility], [address], [place], [place] [address]    Special Requests   Final    BOTTLES DRAWN AEROBIC AND ANAEROBIC Blood Culture adequate volume Performed at [facility], [address], [place], [place] [address]    Culture   Final    NO GROWTH < 24 HOURS Performed at [facility], [address] [place], [place]  [address]    Report Status PENDING  Incomplete  Resp Panel by RT-PCR (Flu A&B, Covid) Nasopharyngeal Swab     Status: Abnormal   Collection Time: 03/03/21  3:08 PM   Specimen: Nasopharyngeal Swab; Nasopharyngeal(NP) swabs in vial transport medium  Result Value Ref Range Status   SARS Coronavirus 2 by RT PCR POSITIVE (A) NEGATIVE Final    Comment: RESULT CALLED TO, READ BACK BY AND VERIFIED WITH: BOWEN,M. RN AT 1628 03/03/21 MULLINS,T (NOTE) SARS-CoV-2 target nucleic acids are DETECTED.  The SARS-CoV-2 RNA is generally detectable in upper respiratory specimens during the acute phase of infection. Positive results are indicative of the presence of the identified virus, but do not rule out bacterial infection or co-infection with other pathogens not detected by the test. Clinical correlation with patient history and other diagnostic information is necessary to determine patient infection status. The expected result is Negative.  Fact Sheet for Patients: BloggerCourse.com  Fact Sheet for Healthcare Providers: SeriousBroker.it  This test is not yet approved or cleared by the [place] FDA and  has been authorized for detection and/or diagnosis of SARS-CoV-2 by FDA under an Emergency Use Authorization (EUA).  This EUA will remain in effect (meaning this test c an be used) for the duration of  the COVID-19 declaration under Section 564(b)(1) of the Act, 21 U.S.C. section 360bbb-3(b)(1), unless the authorization is terminated or revoked sooner.     Influenza A by PCR NEGATIVE NEGATIVE Final  Influenza B by PCR NEGATIVE NEGATIVE Final    Comment: (NOTE) The Xpert Xpress SARS-CoV-2/FLU/RSV plus assay is intended as an aid in the diagnosis of influenza from Nasopharyngeal swab specimens and should not be used as a sole basis for treatment. Nasal washings and aspirates are unacceptable for Xpert Xpress  SARS-CoV-2/FLU/RSV testing.  Fact Sheet for Patients: BloggerCourse.com  Fact Sheet for Healthcare Providers: SeriousBroker.it  This test is not yet approved or cleared by the [place] FDA and has been authorized for detection and/or diagnosis of SARS-CoV-2 by FDA under an Emergency Use Authorization (EUA). This EUA will remain in effect (meaning this test can be used) for the duration of the COVID-19 declaration under Section 564(b)(1) of the Act, 21 U.S.C. section 360bbb-3(b)(1), unless the authorization is terminated or revoked.  Performed at [facility], [address], [place], [place] [address]   Urine culture     Status: None   Collection Time: 03/03/21  3:08 PM   Specimen: In/Out Cath Urine  Result Value Ref Range Status   Specimen Description   Final    IN/OUT CATH URINE Performed at [facility], [address], [place], [place] [address]    Special Requests   Final    NONE Performed at [facility], [address], [place], [place] [address]    Culture   Final    NO GROWTH Performed at [facility], [address] [place], [place] [address]    Report Status 03/04/2021 FINAL  Final  Blood culture (routine x 2)     Status: None (Preliminary result)   Collection Time: 03/03/21  3:41 PM   Specimen: BLOOD  Result Value Ref Range Status   Specimen Description   Final    BLOOD RIGHT ANTECUBITAL Performed at [facility], [address], [place], [place] [address]    Special Requests   Final    BOTTLES DRAWN AEROBIC AND ANAEROBIC Blood Culture adequate volume Performed at [facility], [address] [place], [place] [address]    Culture   Final    NO GROWTH < 24 HOURS Performed at [facility], [address], [place], [place] [address]    Report Status PENDING  Incomplete    Studies/Results: CT  Angio Chest PE W/Cm &/Or Wo Cm  Result Date: 03/02/2021 CLINICAL DATA:  Fever, abdominal pain. EXAM: CT ANGIOGRAPHY CHEST CT ABDOMEN AND PELVIS WITH CONTRAST TECHNIQUE: Multidetector CT imaging of the chest was performed using the standard protocol during bolus administration of intravenous contrast. Multiplanar CT image reconstructions and MIPs were obtained to evaluate the vascular anatomy. Multidetector CT imaging of the abdomen and pelvis was performed using the standard protocol during bolus administration of intravenous contrast. Unfortunately, there is motion artifact during the abdominal scan, limiting these images. CONTRAST:  OMNIPAQUE IOHEXOL 350 MG/ML SOLN COMPARISON:  March 10, 2017. FINDINGS: CTA CHEST FINDINGS Cardiovascular: Satisfactory opacification of the pulmonary arteries to the segmental level. No evidence of pulmonary embolism. Normal heart size. No pericardial effusion. Mediastinum/Nodes: Status post thyroidectomy. The esophagus is unremarkable. No adenopathy is noted. Lungs/Pleura: No pneumothorax or pleural effusion is noted. Right upper lobe airspace opacity is noted concerning for pneumonia. Musculoskeletal: No chest wall abnormality. No acute or significant osseous findings. Review of the MIP images confirms the above findings. CT ABDOMEN and PELVIS FINDINGS Hepatobiliary: No focal liver abnormality is seen. No gallstones, gallbladder wall thickening, or biliary dilatation. Pancreas: Unremarkable.  No pancreatic ductal dilatation or surrounding inflammatory changes. Spleen: Normal in size without focal abnormality. Adrenals/Urinary Tract: Adrenal glands appear normal. Visualized portions of kidneys are unremarkable, although the lower poles are not well visualized due to patient motion artifact. No definite hydronephrosis or renal obstruction is noted. No definite renal or ureteral calculi are noted. Urinary bladder is unremarkable. Stomach/Bowel: Stomach is within normal limits.  Appendix appears normal. No evidence of bowel wall thickening, distention, or inflammatory changes. Vascular/Lymphatic: No significant vascular findings are present. No enlarged abdominal or pelvic lymph nodes. Reproductive: Stable mild prostatic enlargement is noted. Other: Small fat containing right inguinal hernia is noted. No ascites is noted. Musculoskeletal: No acute or significant osseous findings. Review of the MIP images confirms the above findings. IMPRESSION: No definite evidence of pulmonary embolus. Probable right upper lobe pneumonia. Limited evaluation of the kidneys is noted due to patient motion artifact. Lower poles are not well visualized and therefore pathology in these areas cannot be excluded. No definite abnormality is seen involving the visualized portions of the kidneys. Small fat containing right inguinal hernia. Stable mild prostatic enlargement. Electronically Signed   By: Lupita Raider M.D.   On: 03/02/2021 16:49   CT Abdomen Pelvis W Contrast  Result Date: 03/02/2021 CLINICAL DATA:  Fever, abdominal pain. EXAM: CT ANGIOGRAPHY CHEST CT ABDOMEN AND PELVIS WITH CONTRAST TECHNIQUE: Multidetector CT imaging of the chest was performed using the standard protocol during bolus administration of intravenous contrast. Multiplanar CT image reconstructions and MIPs were obtained to evaluate the vascular anatomy. Multidetector CT imaging of the abdomen and pelvis was performed using the standard protocol during bolus administration of intravenous contrast. Unfortunately, there is motion artifact during the abdominal scan, limiting these images. CONTRAST:  OMNIPAQUE IOHEXOL 350 MG/ML SOLN COMPARISON:  March 10, 2017. FINDINGS: CTA CHEST FINDINGS Cardiovascular: Satisfactory opacification of the pulmonary arteries to the segmental level. No evidence of pulmonary embolism. Normal heart size. No pericardial effusion. Mediastinum/Nodes: Status post thyroidectomy. The esophagus is unremarkable.  No adenopathy is noted. Lungs/Pleura: No pneumothorax or pleural effusion is noted. Right upper lobe airspace opacity is noted concerning for pneumonia. Musculoskeletal: No chest wall abnormality. No acute or significant osseous findings. Review of the MIP images confirms the above findings. CT ABDOMEN and PELVIS FINDINGS Hepatobiliary: No focal liver abnormality is seen. No gallstones, gallbladder wall thickening, or biliary dilatation. Pancreas: Unremarkable. No pancreatic ductal dilatation or surrounding inflammatory changes. Spleen: Normal in size without focal abnormality. Adrenals/Urinary Tract: Adrenal glands appear normal. Visualized portions of kidneys are unremarkable, although the lower poles are not well visualized due to patient motion artifact. No definite hydronephrosis or renal obstruction is noted. No definite renal or ureteral calculi are noted. Urinary bladder is unremarkable. Stomach/Bowel: Stomach is within normal limits. Appendix appears normal. No evidence of bowel wall thickening, distention, or inflammatory changes. Vascular/Lymphatic: No significant vascular findings are present. No enlarged abdominal or pelvic lymph nodes. Reproductive: Stable mild prostatic enlargement is noted. Other: Small fat containing right inguinal hernia is noted. No ascites is noted. Musculoskeletal: No acute or significant osseous findings. Review of the MIP images confirms the above findings. IMPRESSION: No definite evidence of pulmonary embolus. Probable right upper lobe pneumonia. Limited evaluation of the kidneys is noted due to patient motion artifact. Lower poles are not well visualized and therefore pathology in these areas cannot be excluded. No definite abnormality is seen involving the visualized portions of the kidneys. Small fat containing right inguinal hernia. Stable mild prostatic enlargement. Electronically  Signed   By: Lupita Raider M.D.   On: 03/02/2021 16:49   DG Chest Port 1 View  Result  Date: 03/03/2021 CLINICAL DATA:  Fever. EXAM: PORTABLE CHEST 1 VIEW COMPARISON:  03/02/2021 FINDINGS: 1535 hours. Patchy airspace disease noted right upper lung and right base. Interstitial markings are diffusely coarsened with chronic features. Bones are diffusely demineralized. Telemetry leads overlie the chest. IMPRESSION: Similar appearance of patchy airspace disease right upper and lower lung compatible with pneumonia. Chronic interstitial changes. Electronically Signed   By: Kennith Center M.D.   On: 03/03/2021 16:03   ECHOCARDIOGRAM COMPLETE  Result Date: 03/04/2021    ECHOCARDIOGRAM REPORT   Patient Name:   Brandon [name] Date of Exam: 03/04/2021 Medical Rec #:  [id number]       Height:       73.0 in Accession #:    [id number]      Weight:       188.1 lb Date of Birth:  10-28-1951       BSA:          2.096 m Patient Age:    69 years        BP:           137/77 mmHg Patient Gender: M               HR:           114 bpm. Exam Location:  Inpatient Procedure: 2D Echo, 3D Echo, Cardiac Doppler and Color Doppler Indications:     Bacteremia.  History:         Patient has no prior history of Echocardiogram examinations.                  Abnormal ECG, Arrythmias:Atrial Fibrillation,                  Signs/Symptoms:Bacteremia; Risk Factors:Hypertension and                  Dyslipidemia. Covid infection. Left pneumothorax.  Sonographer:     Sheralyn Boatman RDCS Referring Phys:  [id number] Marten CHIU Diagnosing Phys: Laurance Flatten MD  Sonographer Comments: Technically difficult study. IMPRESSIONS  1. There is a mass present on the anterior mitral valve leaflet concerning for a vegetation that measures 1.4x1.0cm. Suspect moderate mitral regurgitation with two separate jets that are posteriorly and anteriorly directed raising concern for possible leaflet perforation.The mean gradient across the MV is (HR 100) with MVA by continuity of 1.35cm2 suggesting at least mild mitral stenosis in the setting of the mitral valve  vegetation as detailed above. Recommend TEE for further evaluation.  2. Left ventricular ejection fraction, by estimation, is 60 to 65%. The left ventricle has normal function. The left ventricle has no regional wall motion abnormalities. There is moderate asymmetric left ventricular hypertrophy of the basal-septal segment. There is mild LVH of the rest of the LV segments. Left ventricular diastolic parameters are consistent with Grade I diastolic dysfunction (impaired relaxation).  3. Right ventricular systolic function is normal. The right ventricular size is normal. There is severely elevated pulmonary artery systolic pressure. The estimated right ventricular systolic pressure is 71.0 mmHg.  4. The aortic valve is tricuspid. There is mild calcification of the aortic valve. There is moderate thickening of the aortic valve. Aortic valve regurgitation is mild. Mild to moderate aortic valve sclerosis/calcification is present, without any evidence of aortic stenosis. No distinct vegetations visualized, but the valve is thickened.  5.  The inferior vena cava is normal in size with greater than 50% respiratory variability, suggesting right atrial pressure of 3 mmHg. Comparison(s): No prior Echocardiogram. Conclusion(s)/Recommendation(s): Findings concerning for mitral valve vegetation, would recommend a Transesophageal Echocardiogram for clarification. FINDINGS  Left Ventricle: Left ventricular ejection fraction, by estimation, is 60 to 65%. The left ventricle has normal function. The left ventricle has no regional wall motion abnormalities. 3D left ventricular ejection fraction analysis performed but not reported based on interpreter judgement due to suboptimal quality. The left ventricular internal cavity size was normal in size. There is moderate asymmetric left ventricular hypertrophy of the basal-septal segment. Left ventricular diastolic parameters are consistent with Grade I diastolic dysfunction (impaired  relaxation). There is flow acceleration in the LVOT, however, no systolic anterior motion visualized. Right Ventricle: The right ventricular size is normal. No increase in right ventricular wall thickness. Right ventricular systolic function is normal. There is severely elevated pulmonary artery systolic pressure. The tricuspid regurgitant velocity is 3.74 m/s, and with an assumed right atrial pressure of 15 mmHg, the estimated right ventricular systolic pressure is 71.0 mmHg. Left Atrium: Left atrial size was normal in size. Right Atrium: Right atrial size was normal in size. Pericardium: There is no evidence of pericardial effusion. Mitral Valve: There is a mass present on the anterior mitral valve leaflet concerning for a vegetation that measures 1.4x1.0cm. Suspect moderate mitral regurgitation with two separate jets that are posteriorly and anteriorly directed raising concern for possible leaflet perforation.The mean gradient across the MV is with MVA by continuity of 1.35cm2 suggesting at least mild mitral stenosis in the setting of the mitral valve vegetation as detailed above. Recommend TEE for further evaluation. The mitral valve is abnormal. There is moderate thickening of the mitral valve leaflet(s). There is severe calcification of the mitral valve leaflet(s). Mild mitral annular calcification. Moderate mitral valve regurgitation. Mild to moderate mitral valve stenosis. MV peak gradient, 22.1 mmHg. The mean mitral valve gradient is 8.0 mmHg. Tricuspid Valve: The tricuspid valve is normal in structure. Tricuspid valve regurgitation is trivial. Aortic Valve: The aortic valve is tricuspid. There is mild calcification of the aortic valve. There is moderate thickening of the aortic valve. Aortic valve regurgitation is mild. Mild to moderate aortic valve sclerosis/calcification is present, without any evidence of aortic stenosis. Pulmonic Valve: The pulmonic valve was normal in structure. Pulmonic valve  regurgitation is trivial. Aorta: The aortic root and ascending aorta are structurally normal, with no evidence of dilitation. Venous: The inferior vena cava is normal in size with greater than 50% respiratory variability, suggesting right atrial pressure of 3 mmHg. IAS/Shunts: No atrial level shunt detected by color flow Doppler.  LEFT VENTRICLE PLAX 2D LVIDd:         4.10 cm      Diastology LVIDs:         2.70 cm      LV e' medial:    9.79 cm/s LV PW:         1.40 cm      LV E/e' medial:  14.5 LV IVS:        1.70 cm      LV e' lateral:   8.92 cm/s LVOT diam:     2.10 cm      LV E/e' lateral: 15.9 LV SV:         65 LV SV Index:   31 LVOT Area:     3.46 cm  LV Volumes (MOD) LV vol d, MOD A2C:  77.3 ml LV vol d, MOD A4C: 104.0 ml LV vol s, MOD A2C: 18.3 ml LV vol s, MOD A4C: 38.5 ml LV SV MOD A2C:     59.0 ml LV SV MOD A4C:     104.0 ml LV SV MOD BP:      72.3 ml RIGHT VENTRICLE             IVC RV S prime:     10.00 cm/s  IVC diam: 2.00 cm TAPSE (M-mode): 1.3 cm LEFT ATRIUM           Index       RIGHT ATRIUM           Index LA diam:      6.00 cm 2.86 cm/m  RA Area:     14.00 cm LA Vol (A2C): 72.1 ml 34.40 ml/m RA Volume:   26.60 ml  12.69 ml/m LA Vol (A4C): 51.1 ml 24.38 ml/m  AORTIC VALVE             PULMONIC VALVE LVOT Vmax:   109.00 cm/s PR End Diast Vel: 1.86 msec LVOT Vmean:  74.500 cm/s LVOT VTI:    0.188 m  AORTA Ao Root diam: 3.70 cm Ao Asc diam:  3.40 cm MITRAL VALVE                 TRICUSPID VALVE MV Area (PHT): 3.21 cm      TR Peak grad:   56.0 mmHg MV Area VTI:   1.48 cm      TR Vmax:        374.00 cm/s MV Peak grad:  22.1 mmHg MV Mean grad:  8.0 mmHg      SHUNTS MV Vmax:       2.35 m/s      Systemic VTI:  0.19 m MV Vmean:      133.0 cm/s    Systemic Diam: 2.10 cm MV Decel Time: 236 msec MR Peak grad:    70.6 mmHg MR Mean grad:    53.0 mmHg MR Vmax:         420.00 cm/s MR Vmean:        351.0 cm/s MR PISA:         4.02 cm MR PISA Eff ROA: 36 mm MR PISA Radius:  0.80 cm MV E velocity: 141.50 cm/s MV  A velocity: 157.00 cm/s MV E/A ratio:  0.90 Laurance Flatten MD Electronically signed by Laurance Flatten MD Signature Date/Time: 03/04/2021/12:56:47 PM    Final (Updated)       Assessment/Plan:  INTERVAL HISTORY: 2D echocardiogram shows what I suspected which is endocarditis with a mass on the anterior leaflet of the mitral valve 1.5 x 1 cm with moderate mitral rotation and concern for possible leaflet perforation   Active Problems:   Sepsis (HCC)   Bacteremia    Brandon [name] is a 70 y.o. male with prior history of COVID-19 infection smoldering symptoms with hindsight likely due to subacute bacterial endocarditis which she has now been proven to have on 2D echocardiogram with Enterococcus faecalis growing in blood.  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  Specific aspects of clinical management are critical to optimizing the outcome of patients with IE. Therefore, the Pharmacy Residency Program at [facility] has initiated an evidence-based consult aimed at improving  the management of IE at [facility].     Comments  Consult to ID Outpatient follow-up appointment to be made prior to discharge.  Utilization of ID recommended antibiotic therapy   Surgery Evaluation Cardiothoracic surgery consult if the following indications are present:  - Heart failure associated to valve dysfunction - Endocarditis complicated by heart block or aortic abscess - Left-sided endocarditis caused by S. aureus, fungal, or multi-drug resistant organisms - Left-sided endocarditis with severe valve dysfunction - Large vegetation (>1 cm) on aortic or mitral valve - Persistence of infection 5-7 days after initiation of appropriate antibiotic therapy - Recurrent emboli and persistent vegetations  despite appropriate antibiotic therapy - Any history with left-sided embolization with persistent vegetations on TEE  Consult electrophysiologist if presence of implanted cardiac device (pacemaker, ICD)   Consult to cardiology if left ventricular ejection fraction is <40% Evaluation for appropriate heart failure medications upon discharge: - ACE/ARB - Diuretic - Beta blocker  Outpatient follow-up appointment to be made prior to discharge.   Based on the ID MD's evaluation of this patient, the following are recommended to complete a thorough evaluation and care plan to reflect guideline-recommended therapy.   Infectious disease and cardiology consultation (done), CT surgery consultation.  If TEE is needed for CT surgery evaluation it should be expedited regardless of COVID 19 status of the patient  Continue ceftriaxone and ampicillin   #2 COVID 19: Second infection with this virus.  This would explain the subtle findings on CT scan.  He is on remdesivir currently  His larger problem though is his endocarditis  I spent greater than 35 minutes with the patient including greater than 50% of time in face to face counsel of the patient during his endocarditis and his COVID-19 infection in coordination with Dr Shari ProwsPemberton and Dr. Natale MilchLancaster.    [facility] I will see him there otherwise Dr. Earlene PlaterWallace will see him tomorrow if he is at [facility].     LOS: 1 day   Acey LavCornelius Van Dam 03/04/2021, 3:16 PM

## 2021-03-04 NOTE — Progress Notes (Signed)
  Echocardiogram 2D Echocardiogram has been performed.  Janalyn Harder 03/04/2021, 11:21 AM

## 2021-03-04 NOTE — Progress Notes (Signed)
Provider notified [facility]. Gave tylenol and oxygen, vs rechecked and returned to [facility]. Will continue to monitor,please see reassessment.

## 2021-03-05 DIAGNOSIS — A419 Sepsis, unspecified organism: Secondary | ICD-10-CM | POA: Diagnosis not present

## 2021-03-05 DIAGNOSIS — I330 Acute and subacute infective endocarditis: Secondary | ICD-10-CM

## 2021-03-05 DIAGNOSIS — A4181 Sepsis due to Enterococcus: Principal | ICD-10-CM

## 2021-03-05 DIAGNOSIS — I480 Paroxysmal atrial fibrillation: Secondary | ICD-10-CM

## 2021-03-05 DIAGNOSIS — U071 COVID-19: Secondary | ICD-10-CM | POA: Diagnosis not present

## 2021-03-05 DIAGNOSIS — R7881 Bacteremia: Secondary | ICD-10-CM | POA: Diagnosis not present

## 2021-03-05 DIAGNOSIS — I340 Nonrheumatic mitral (valve) insufficiency: Secondary | ICD-10-CM | POA: Diagnosis not present

## 2021-03-05 DIAGNOSIS — J1282 Pneumonia due to coronavirus disease 2019: Secondary | ICD-10-CM | POA: Diagnosis not present

## 2021-03-05 DIAGNOSIS — I059 Rheumatic mitral valve disease, unspecified: Secondary | ICD-10-CM | POA: Diagnosis not present

## 2021-03-05 DIAGNOSIS — Z8616 Personal history of COVID-19: Secondary | ICD-10-CM | POA: Diagnosis not present

## 2021-03-05 LAB — CBC
HCT: 33.7 % — ABNORMAL LOW (ref 39.0–52.0)
Hemoglobin: 11.1 g/dL — ABNORMAL LOW (ref 13.0–17.0)
MCH: 31.2 pg (ref 26.0–34.0)
MCHC: 32.9 g/dL (ref 30.0–36.0)
MCV: 94.7 fL (ref 80.0–100.0)
Platelets: 325 10*3/uL (ref 150–400)
RBC: 3.56 MIL/uL — ABNORMAL LOW (ref 4.22–5.81)
RDW: 13.8 % (ref 11.5–15.5)
WBC: 13.7 10*3/uL — ABNORMAL HIGH (ref 4.0–10.5)
nRBC: 0 % (ref 0.0–0.2)

## 2021-03-05 LAB — COMPREHENSIVE METABOLIC PANEL
ALT: 63 U/L — ABNORMAL HIGH (ref 0–44)
AST: 33 U/L (ref 15–41)
Albumin: 2.4 g/dL — ABNORMAL LOW (ref 3.5–5.0)
Alkaline Phosphatase: 181 U/L — ABNORMAL HIGH (ref 38–126)
Anion gap: 7 (ref 5–15)
BUN: 17 mg/dL (ref 8–23)
CO2: 24 mmol/L (ref 22–32)
Calcium: 8 mg/dL — ABNORMAL LOW (ref 8.9–10.3)
Chloride: 100 mmol/L (ref 98–111)
Creatinine, Ser: 1.18 mg/dL (ref 0.61–1.24)
GFR, Estimated: >60 mL/min (ref >60)
Glucose, Bld: 98 mg/dL (ref 70–99)
Potassium: 3.5 mmol/L (ref 3.5–5.1)
Sodium: 131 mmol/L — ABNORMAL LOW (ref 135–145)
Total Bilirubin: 1 mg/dL (ref 0.3–1.2)
Total Protein: 6.3 g/dL — ABNORMAL LOW (ref 6.5–8.1)

## 2021-03-05 LAB — CULTURE, BLOOD (ROUTINE X 2)
Special Requests: ADEQUATE
Special Requests: ADEQUATE

## 2021-03-05 LAB — FERRITIN: Ferritin: 615 ng/mL — ABNORMAL HIGH (ref 24–336)

## 2021-03-05 LAB — C-REACTIVE PROTEIN: CRP: 20.8 mg/dL — ABNORMAL HIGH (ref <1.0)

## 2021-03-05 LAB — HEPATITIS B SURFACE ANTIBODY, QUANTITATIVE: Hep B S AB Quant (Post): 85.9 m[IU]/mL (ref >9.9)

## 2021-03-05 LAB — MRSA PCR SCREENING: MRSA by PCR: POSITIVE — AB

## 2021-03-05 MED ORDER — ADULT MULTIVITAMIN W/MINERALS CH
1.0000 | ORAL_TABLET | Freq: Every day | ORAL | Status: DC
  Administered 2021-03-06 – 2021-03-15 (×10): 1 via ORAL
  Filled 2021-03-05 (×11): qty 1

## 2021-03-05 MED ORDER — ENSURE ENLIVE PO LIQD
237.0000 mL | Freq: Two times a day (BID) | ORAL | Status: DC
  Administered 2021-03-05 – 2021-03-15 (×16): 237 mL via ORAL

## 2021-03-05 MED ORDER — MUPIROCIN 2 % EX OINT
1.0000 "application " | TOPICAL_OINTMENT | Freq: Two times a day (BID) | CUTANEOUS | Status: AC
  Administered 2021-03-05 – 2021-03-09 (×10): 1 via NASAL
  Filled 2021-03-05 (×2): qty 22

## 2021-03-05 MED ORDER — CHLORHEXIDINE GLUCONATE CLOTH 2 % EX PADS
6.0000 | MEDICATED_PAD | Freq: Every day | CUTANEOUS | Status: AC
  Administered 2021-03-05 – 2021-03-09 (×5): 6 via TOPICAL

## 2021-03-05 MED ORDER — DEXAMETHASONE 4 MG PO TABS: 6.0000 mg | ORAL_TABLET | Freq: Every day | ORAL | Status: DC

## 2021-03-05 MED ORDER — TRAZODONE HCL 50 MG PO TABS
50.0000 mg | ORAL_TABLET | Freq: Every day | ORAL | Status: DC
  Administered 2021-03-05 – 2021-03-15 (×10): 50 mg via ORAL
  Filled 2021-03-05 (×12): qty 1

## 2021-03-05 NOTE — Plan of Care (Signed)

## 2021-03-05 NOTE — Progress Notes (Signed)
Subjective:  He is more out of breath today  Antibiotics:  Anti-infectives (From admission, onward)   Start     Dose/Rate Route Frequency Ordered Stop   03/05/21 1000  remdesivir 100 mg in sodium chloride 0.9 % 100 mL IVPB       "Followed by" Linked Group Details   100 mg 200 mL/hr over 30 Minutes Intravenous Daily 03/04/21 0035 03/09/21 0959   03/04/21 1000  remdesivir 100 mg in sodium chloride 0.9 % 100 mL IVPB  Status:  Discontinued       "Followed by" Linked Group Details   100 mg 200 mL/hr over 30 Minutes Intravenous Daily 03/03/21 1806 03/04/21 0035   03/04/21 1000  remdesivir 200 mg in sodium chloride 0.9% 250 mL IVPB       "Followed by" Linked Group Details   200 mg 580 mL/hr over 30 Minutes Intravenous Once 03/04/21 0035 03/04/21 1230   03/03/21 2000  cefTRIAXone (ROCEPHIN) 2 g in sodium chloride 0.9 % 100 mL IVPB        2 g 200 mL/hr over 30 Minutes Intravenous Every 12 hours 03/03/21 1708     03/03/21 2000  remdesivir 200 mg in sodium chloride 0.9% 250 mL IVPB  Status:  Discontinued       "Followed by" Linked Group Details   200 mg 580 mL/hr over 30 Minutes Intravenous Once 03/03/21 1806 03/04/21 0035   03/03/21 1600  ampicillin (OMNIPEN) 2 g in sodium chloride 0.9 % 100 mL IVPB        2 g 300 mL/hr over 20 Minutes Intravenous Every 4 hours 03/03/21 1525        Medications: Scheduled Meds: . vitamin C  500 mg Oral Daily  . Chlorhexidine Gluconate Cloth  6 each Topical Q0600  . cholecalciferol  1,000 Units Oral Daily  . enoxaparin (LOVENOX) injection  40 mg Subcutaneous Q24H  . feeding supplement  237 mL Oral BID BM  . levothyroxine  175 mcg Oral QAC breakfast  . mouth rinse  15 mL Mouth Rinse BID  . multivitamin with minerals  1 tablet Oral Daily  . mupirocin ointment  1 application Nasal BID  . zinc sulfate  220 mg Oral Daily   Continuous Infusions: . sodium chloride 50 mL/hr at 03/05/21 1127  . sodium chloride 10 mL/hr at 03/05/21 0600  .  sodium chloride Stopped (03/05/21 0323)  . ampicillin (OMNIPEN) IV 2 g (03/05/21 1128)  . cefTRIAXone (ROCEPHIN)  IV 2 g (03/05/21 0829)  . remdesivir 100 mg in NS 100 mL 100 mg (03/05/21 1008)   PRN Meds:.sodium chloride, sodium chloride, acetaminophen **OR** acetaminophen, albuterol, chlorpheniramine-HYDROcodone, fluticasone, guaiFENesin-dextromethorphan, hydrALAZINE    Objective: Weight change:   Intake/Output Summary (Last 24 hours) at 03/05/2021 1450 Last data filed at 03/05/2021 0807 Gross per 24 hour  Intake 3775.59 ml  Output 1375 ml  Net 2400.59 ml   Blood pressure (!) 145/87, pulse (!) 104, temperature 98.7 F (37.1 C), temperature source Oral, resp. rate (!) 22, height  (1.854 m), weight 85.3 kg, SpO2 96 %. Temp:  [98.5 F (36.9 C)-100.2 F (37.9 C)] 98.7 F (37.1 C) (04/11 1127) Pulse Rate:  [104-123] 104 (04/11 1127) Resp:  [19-35] 22 (04/11 1127) BP: (123-162)/(70-87) 145/87 (04/11 1127) SpO2:  [94 %-97 %] 96 % (04/11 1127)  Physical Exam: Physical Exam Constitutional:      Appearance: He is well-developed.  HENT:     Head: Normocephalic and atraumatic.  Eyes:     Conjunctiva/sclera: Conjunctivae normal.  Cardiovascular:     Rate and Rhythm: Normal rate and regular rhythm.     Heart sounds: Murmur heard.    Pulmonary:     Effort: Pulmonary effort is normal. No respiratory distress.     Breath sounds: No stridor. No wheezing or rhonchi.  Abdominal:     General: There is no distension.     Palpations: Abdomen is soft. There is no mass.  Musculoskeletal:        General: Normal range of motion.     Cervical back: Normal range of motion and neck supple.  Skin:    General: Skin is warm and dry.     Findings: No erythema or rash.  Neurological:     General: No focal deficit present.     Mental Status: He is alert and oriented to person, place, and time.  Psychiatric:        Mood and Affect: Mood normal.        Behavior: Behavior normal.         Thought Content: Thought content normal.        Judgment: Judgment normal.      CBC:    BMET Recent Labs    03/04/21 0737 03/05/21 0042  NA 135 131*  K 3.6 3.5  CL 103 100  CO2 24 24  GLUCOSE 95 98  BUN 23 17  CREATININE 1.06 1.18  CALCIUM 8.1* 8.0*     Liver Panel  Recent Labs    03/04/21 0737 03/05/21 0042  PROT 6.1* 6.3*  ALBUMIN 2.6* 2.4*  AST 23 33  ALT 55* 63*  ALKPHOS 166* 181*  BILITOT 0.6 1.0       Sedimentation Rate No results for input(s): ESRSEDRATE in the last 72 hours. C-Reactive Protein Recent Labs    03/04/21 0737 03/05/21 0042  CRP 11.1* 20.8*    Micro Results: Recent Results (from the past 720 hour(s))  Culture, blood (routine x 2)     Status: Abnormal   Collection Time: 03/02/21 11:50 AM   Specimen: BLOOD  Result Value Ref Range Status   Specimen Description   Final    BLOOD LEFT ANTECUBITAL Performed at [facility] [place], [address] [place], [place] [address]    Special Requests   Final    BOTTLES DRAWN AEROBIC AND ANAEROBIC Blood Culture adequate volume Performed at [facility] [place], [address] [place], [place] [address]    Culture  Setup Time   Final    GRAM POSITIVE COCCI IN BOTH AEROBIC AND ANAEROBIC BOTTLES CRITICAL VALUE NOTED.  VALUE IS CONSISTENT WITH PREVIOUSLY REPORTED AND CALLED VALUE.    Culture (A)  Final    ENTEROCOCCUS FAECALIS SUSCEPTIBILITIES PERFORMED ON PREVIOUS CULTURE WITHIN THE LAST 5 DAYS. Performed at [facility], [address] [place], [place] [address]    Report Status 03/05/2021 FINAL  Final  Culture, blood (routine x 2)     Status: Abnormal   Collection Time: 03/02/21  1:10 PM   Specimen: Right Antecubital; Blood  Result Value Ref Range Status   Specimen Description   Final    RIGHT ANTECUBITAL BLOOD Performed at [facility] [place], [address] [place], [place] [address]    Special Requests   Final    BOTTLES DRAWN AEROBIC AND ANAEROBIC Blood  Culture adequate volume Performed at [facility] [place], [address] [place], [place]  [address]    Culture  Setup Time   Final    GRAM POSITIVE COCCI IN CHAINS IN BOTH AEROBIC AND ANAEROBIC BOTTLES CRITICAL RESULT CALLED TO, READ BACK BY AND VERIFIED WITH: Heywood Bene RN @1205  03/03/21 EB Performed at [facility], [address] [place], [place]    Culture ENTEROCOCCUS FAECALIS (A)  Final   Report Status 03/05/2021 FINAL  Final   Organism ID, Bacteria ENTEROCOCCUS FAECALIS  Final      Susceptibility   Enterococcus faecalis - MIC*    AMPICILLIN <=2 SENSITIVE Sensitive     VANCOMYCIN 1 SENSITIVE Sensitive     GENTAMICIN SYNERGY SENSITIVE Sensitive     * ENTEROCOCCUS FAECALIS  Blood Culture ID Panel (Reflexed)     Status: Abnormal   Collection Time: 03/02/21  1:10 PM  Result Value Ref Range Status   Enterococcus faecalis DETECTED (A) NOT DETECTED Final    Comment: CRITICAL RESULT CALLED TO, READ BACK BY AND VERIFIED WITH: S GOUGE RN @1203  03/03/21 EB    Enterococcus Faecium NOT DETECTED NOT DETECTED Final   Listeria monocytogenes NOT DETECTED NOT DETECTED Final   Staphylococcus species NOT DETECTED NOT DETECTED Final   Staphylococcus aureus (BCID) NOT DETECTED NOT DETECTED Final   Staphylococcus epidermidis NOT DETECTED NOT DETECTED Final   Staphylococcus lugdunensis NOT DETECTED NOT DETECTED Final   Streptococcus species NOT DETECTED NOT DETECTED Final   Streptococcus agalactiae NOT DETECTED NOT DETECTED Final   Streptococcus pneumoniae NOT DETECTED NOT DETECTED Final   Streptococcus pyogenes NOT DETECTED NOT DETECTED Final   A.calcoaceticus-baumannii NOT DETECTED NOT DETECTED Final   Bacteroides fragilis NOT DETECTED NOT DETECTED Final   Enterobacterales NOT DETECTED NOT DETECTED Final   Enterobacter cloacae complex NOT DETECTED NOT DETECTED Final   Escherichia coli NOT DETECTED NOT DETECTED Final   Klebsiella aerogenes NOT DETECTED NOT DETECTED Final    Klebsiella oxytoca NOT DETECTED NOT DETECTED Final   Klebsiella pneumoniae NOT DETECTED NOT DETECTED Final   Proteus species NOT DETECTED NOT DETECTED Final   Salmonella species NOT DETECTED NOT DETECTED Final   Serratia marcescens NOT DETECTED NOT DETECTED Final   Haemophilus influenzae NOT DETECTED NOT DETECTED Final   Neisseria meningitidis NOT DETECTED NOT DETECTED Final   Pseudomonas aeruginosa NOT DETECTED NOT DETECTED Final   Stenotrophomonas maltophilia NOT DETECTED NOT DETECTED Final   Candida albicans NOT DETECTED NOT DETECTED Final   Candida auris NOT DETECTED NOT DETECTED Final   Candida glabrata NOT DETECTED NOT DETECTED Final   Candida krusei NOT DETECTED NOT DETECTED Final   Candida parapsilosis NOT DETECTED NOT DETECTED Final   Candida tropicalis NOT DETECTED NOT DETECTED Final   Cryptococcus neoformans/gattii NOT DETECTED NOT DETECTED Final   Vancomycin resistance NOT DETECTED NOT DETECTED Final    Comment: Performed at [facility], [address], [place], [address] [place]  Resp Panel by RT-PCR (Flu A&B, Covid) Nasopharyngeal Swab     Status: None   Collection Time: 03/02/21  3:41 PM   Specimen: Nasopharyngeal Swab; Nasopharyngeal(NP) swabs in vial transport medium  Result Value Ref Range Status   SARS Coronavirus 2 by RT PCR NEGATIVE NEGATIVE Final    Comment: (NOTE) SARS-CoV-2 target nucleic acids are NOT DETECTED.  The SARS-CoV-2 RNA is generally detectable in upper respiratory specimens during the acute phase of infection. The lowest concentration of SARS-CoV-2 viral copies this assay can detect is 138 copies/mL. A negative result does not preclude SARS-Cov-2 infection and should not be  used as the sole basis for treatment or other patient management decisions. A negative result may occur with  improper specimen collection/handling, submission of specimen other than nasopharyngeal swab, presence of viral mutation(s) within the areas targeted by this  assay, and inadequate number of viral copies(<138 copies/mL). A negative result must be combined with clinical observations, patient history, and epidemiological information. The expected result is Negative.  Fact Sheet for Patients:  BloggerCourse.com  Fact Sheet for Healthcare Providers:  SeriousBroker.it  This test is no t yet approved or cleared by the [place] FDA and  has been authorized for detection and/or diagnosis of SARS-CoV-2 by FDA under an Emergency Use Authorization (EUA). This EUA will remain  in effect (meaning this test can be used) for the duration of the COVID-19 declaration under Section 564(b)(1) of the Act, 21 U.S.C.section 360bbb-3(b)(1), unless the authorization is terminated  or revoked sooner.       Influenza A by PCR NEGATIVE NEGATIVE Final   Influenza B by PCR NEGATIVE NEGATIVE Final    Comment: (NOTE) The Xpert Xpress SARS-CoV-2/FLU/RSV plus assay is intended as an aid in the diagnosis of influenza from Nasopharyngeal swab specimens and should not be used as a sole basis for treatment. Nasal washings and aspirates are unacceptable for Xpert Xpress SARS-CoV-2/FLU/RSV testing.  Fact Sheet for Patients: BloggerCourse.com  Fact Sheet for Healthcare Providers: SeriousBroker.it  This test is not yet approved or cleared by the [place] FDA and has been authorized for detection and/or diagnosis of SARS-CoV-2 by FDA under an Emergency Use Authorization (EUA). This EUA will remain in effect (meaning this test can be used) for the duration of the COVID-19 declaration under Section 564(b)(1) of the Act, 21 U.S.C. section 360bbb-3(b)(1), unless the authorization is terminated or revoked.  Performed at [facility], [address] [place], [place] [address]   Blood culture (routine x 2)     Status: None (Preliminary result)    Collection Time: 03/03/21  1:58 PM   Specimen: BLOOD  Result Value Ref Range Status   Specimen Description   Final    BLOOD LEFT ANTECUBITAL Performed at [facility], [address], [place], [place] [address]    Special Requests   Final    BOTTLES DRAWN AEROBIC AND ANAEROBIC Blood Culture adequate volume Performed at [facility], [address], [place], [place] [address]    Culture   Final    NO GROWTH 2 DAYS Performed at [facility], [address], [place], [place] [address]    Report Status PENDING  Incomplete  Resp Panel by RT-PCR (Flu A&B, Covid) Nasopharyngeal Swab     Status: Abnormal   Collection Time: 03/03/21  3:08 PM   Specimen: Nasopharyngeal Swab; Nasopharyngeal(NP) swabs in vial transport medium  Result Value Ref Range Status   SARS Coronavirus 2 by RT PCR POSITIVE (A) NEGATIVE Final    Comment: RESULT CALLED TO, READ BACK BY AND VERIFIED WITH: BOWEN,M. RN AT 1628 03/03/21 MULLINS,T (NOTE) SARS-CoV-2 target nucleic acids are DETECTED.  The SARS-CoV-2 RNA is generally detectable in upper respiratory specimens during the acute phase of infection. Positive results are indicative of the presence of the identified virus, but do not rule out bacterial infection or co-infection with other pathogens not detected by the test. Clinical correlation with patient history and other diagnostic information is necessary to determine patient infection status. The expected result is Negative.  Fact Sheet for Patients: BloggerCourse.com  Fact  Sheet for Healthcare Providers: SeriousBroker.it  This test is not yet approved or cleared by the [place] and  has been authorized for detection and/or diagnosis of SARS-CoV-2 by FDA under an Emergency Use Authorization (EUA).  This EUA will remain in effect (meaning this test c an be used) for the duration of  the COVID-19  declaration under Section 564(b)(1) of the Act, 21 U.S.C. section 360bbb-3(b)(1), unless the authorization is terminated or revoked sooner.     Influenza A by PCR NEGATIVE NEGATIVE Final   Influenza B by PCR NEGATIVE NEGATIVE Final    Comment: (NOTE) The Xpert Xpress SARS-CoV-2/FLU/RSV plus assay is intended as an aid in the diagnosis of influenza from Nasopharyngeal swab specimens and should not be used as a sole basis for treatment. Nasal washings and aspirates are unacceptable for Xpert Xpress SARS-CoV-2/FLU/RSV testing.  Fact Sheet for Patients: BloggerCourse.com  Fact Sheet for Healthcare Providers: SeriousBroker.it  This test is not yet approved or cleared by the [place] FDA and has been authorized for detection and/or diagnosis of SARS-CoV-2 by FDA under an Emergency Use Authorization (EUA). This EUA will remain in effect (meaning this test can be used) for the duration of the COVID-19 declaration under Section 564(b)(1) of the Act, 21 U.S.C. section 360bbb-3(b)(1), unless the authorization is terminated or revoked.  Performed at [facility], [address] [place], [place] [address]   Urine culture     Status: None   Collection Time: 03/03/21  3:08 PM   Specimen: In/Out Cath Urine  Result Value Ref Range Status   Specimen Description   Final    IN/OUT CATH URINE Performed at [facility], [address], [place], [place] [address]    Special Requests   Final    NONE Performed at [facility], [address], [place], [place] [address]    Culture   Final    NO GROWTH Performed at [facility], [address] [place], [place] [address]    Report Status 03/04/2021 FINAL  Final  Blood culture (routine x 2)     Status: None (Preliminary result)   Collection Time: 03/03/21  3:41 PM   Specimen: BLOOD  Result Value Ref Range Status   Specimen  Description   Final    BLOOD RIGHT ANTECUBITAL Performed at [facility], [address], [place], [place] [address]    Special Requests   Final    BOTTLES DRAWN AEROBIC AND ANAEROBIC Blood Culture adequate volume Performed at [facility], [address], [place], [place] [address]    Culture   Final    NO GROWTH 2 DAYS Performed at [facility], [address], [place], [place] [address]    Report Status PENDING  Incomplete  MRSA PCR Screening     Status: Abnormal   Collection Time: 03/04/21 11:53 PM   Specimen: Nasopharyngeal  Result Value Ref Range Status   MRSA by PCR POSITIVE (A) NEGATIVE Final    Comment:        The GeneXpert MRSA Assay (FDA approved for NASAL specimens only), is one component of a comprehensive MRSA colonization surveillance program. It is not intended to diagnose MRSA infection nor to guide or monitor treatment for MRSA infections. RESULT CALLED TO, READ BACK BY AND VERIFIED WITH: [facility] RN AT 0121 03/05/2021 MITCHELL,L Performed at [facility], [address] [place], [place] [address]     Studies/Results:  DG Chest Port 1 View  Result Date: 03/03/2021 CLINICAL DATA:  Fever. EXAM: PORTABLE CHEST 1 VIEW COMPARISON:  03/02/2021 FINDINGS: 1535 hours. Patchy airspace disease noted right upper lung and right base. Interstitial markings are diffusely coarsened with chronic features. Bones are diffusely demineralized. Telemetry leads overlie the chest. IMPRESSION: Similar appearance of patchy airspace disease right upper and lower lung compatible with pneumonia. Chronic interstitial changes. Electronically Signed   By: Kennith Center M.D.   On: 03/03/2021 16:03   ECHOCARDIOGRAM COMPLETE  Result Date: 03/04/2021    ECHOCARDIOGRAM REPORT   Patient Name:   LEILAN BOCHENEK Date of Exam: 03/04/2021 Medical Rec #:  [id number]       Height:       73.0 in Accession #:    [id number]      Weight:       188.1 lb Date of  Birth:  [birth year]       BSA:          2.096 m Patient Age:    69 years        BP:           137/77 mmHg Patient Gender: M               HR:           114 bpm. Exam Location:  Inpatient Procedure: 2D Echo, 3D Echo, Cardiac Doppler and Color Doppler Indications:     Bacteremia.  History:         Patient has no prior history of Echocardiogram examinations.                  Abnormal ECG, Arrythmias:Atrial Fibrillation,                  Signs/Symptoms:Bacteremia; Risk Factors:Hypertension and                  Dyslipidemia. Covid infection. Left pneumothorax.  Sonographer:     Sheralyn Boatman RDCS Referring Phys:  [id number] Scheryl Marten CHIU Diagnosing Phys: Laurance Flatten MD  Sonographer Comments: Technically difficult study. IMPRESSIONS  1. There is a mass present on the anterior mitral valve leaflet concerning for a vegetation that measures 1.4x1.0cm. Suspect moderate mitral regurgitation with two separate jets that are posteriorly and anteriorly directed raising concern for possible leaflet perforation.The mean gradient across the MV is (HR 100) with MVA by continuity of 1.35cm2 suggesting at least mild mitral stenosis in the setting of the mitral valve vegetation as detailed above. Recommend TEE for further evaluation.  2. Left ventricular ejection fraction, by estimation, is 60 to 65%. The left ventricle has normal function. The left ventricle has no regional wall motion abnormalities. There is moderate asymmetric left ventricular hypertrophy of the basal-septal segment. There is mild LVH of the rest of the LV segments. Left ventricular diastolic parameters are consistent with Grade I diastolic dysfunction (impaired relaxation).  3. Right ventricular systolic function is normal. The right ventricular size is normal. There is severely elevated pulmonary artery systolic pressure. The estimated right ventricular systolic pressure is 71.0 mmHg.  4. The aortic valve is tricuspid. There is mild calcification of the aortic  valve. There is moderate thickening of the aortic valve. Aortic valve regurgitation is mild. Mild to moderate aortic valve sclerosis/calcification is present, without any evidence of aortic stenosis. No distinct vegetations visualized, but the valve is thickened.  5. The inferior vena cava is normal in size with greater than 50% respiratory variability, suggesting right  atrial pressure of 3 mmHg. Comparison(s): No prior Echocardiogram. Conclusion(s)/Recommendation(s): Findings concerning for mitral valve vegetation, would recommend a Transesophageal Echocardiogram for clarification. FINDINGS  Left Ventricle: Left ventricular ejection fraction, by estimation, is 60 to 65%. The left ventricle has normal function. The left ventricle has no regional wall motion abnormalities. 3D left ventricular ejection fraction analysis performed but not reported based on interpreter judgement due to suboptimal quality. The left ventricular internal cavity size was normal in size. There is moderate asymmetric left ventricular hypertrophy of the basal-septal segment. Left ventricular diastolic parameters are consistent with Grade I diastolic dysfunction (impaired relaxation). There is flow acceleration in the LVOT, however, no systolic anterior motion visualized. Right Ventricle: The right ventricular size is normal. No increase in right ventricular wall thickness. Right ventricular systolic function is normal. There is severely elevated pulmonary artery systolic pressure. The tricuspid regurgitant velocity is 3.74 m/s, and with an assumed right atrial pressure of 15 mmHg, the estimated right ventricular systolic pressure is 71.0 mmHg. Left Atrium: Left atrial size was normal in size. Right Atrium: Right atrial size was normal in size. Pericardium: There is no evidence of pericardial effusion. Mitral Valve: There is a mass present on the anterior mitral valve leaflet concerning for a vegetation that measures 1.4x1.0cm. Suspect moderate  mitral regurgitation with two separate jets that are posteriorly and anteriorly directed raising concern for possible leaflet perforation.The mean gradient across the MV is with MVA by continuity of 1.35cm2 suggesting at least mild mitral stenosis in the setting of the mitral valve vegetation as detailed above. Recommend TEE for further evaluation. The mitral valve is abnormal. There is moderate thickening of the mitral valve leaflet(s). There is severe calcification of the mitral valve leaflet(s). Mild mitral annular calcification. Moderate mitral valve regurgitation. Mild to moderate mitral valve stenosis. MV peak gradient, 22.1 mmHg. The mean mitral valve gradient is 8.0 mmHg. Tricuspid Valve: The tricuspid valve is normal in structure. Tricuspid valve regurgitation is trivial. Aortic Valve: The aortic valve is tricuspid. There is mild calcification of the aortic valve. There is moderate thickening of the aortic valve. Aortic valve regurgitation is mild. Mild to moderate aortic valve sclerosis/calcification is present, without any evidence of aortic stenosis. Pulmonic Valve: The pulmonic valve was normal in structure. Pulmonic valve regurgitation is trivial. Aorta: The aortic root and ascending aorta are structurally normal, with no evidence of dilitation. Venous: The inferior vena cava is normal in size with greater than 50% respiratory variability, suggesting right atrial pressure of 3 mmHg. IAS/Shunts: No atrial level shunt detected by color flow Doppler.  LEFT VENTRICLE PLAX 2D LVIDd:         4.10 cm      Diastology LVIDs:         2.70 cm      LV e' medial:    9.79 cm/s LV PW:         1.40 cm      LV E/e' medial:  14.5 LV IVS:        1.70 cm      LV e' lateral:   8.92 cm/s LVOT diam:     2.10 cm      LV E/e' lateral: 15.9 LV SV:         65 LV SV Index:   31 LVOT Area:     3.46 cm  LV Volumes (MOD) LV vol d, MOD A2C: 77.3 ml LV vol d, MOD A4C: 104.0 ml LV vol s, MOD A2C: 18.3 ml LV  vol s, MOD A4C: 38.5  ml LV SV MOD A2C:     59.0 ml LV SV MOD A4C:     104.0 ml LV SV MOD BP:      72.3 ml RIGHT VENTRICLE             IVC RV S prime:     10.00 cm/s  IVC diam: 2.00 cm TAPSE (M-mode): 1.3 cm LEFT ATRIUM           Index       RIGHT ATRIUM           Index LA diam:      6.00 cm 2.86 cm/m  RA Area:     14.00 cm LA Vol (A2C): 72.1 ml 34.40 ml/m RA Volume:   26.60 ml  12.69 ml/m LA Vol (A4C): 51.1 ml 24.38 ml/m  AORTIC VALVE             PULMONIC VALVE LVOT Vmax:   109.00 cm/s PR End Diast Vel: 1.86 msec LVOT Vmean:  74.500 cm/s LVOT VTI:    0.188 m  AORTA Ao Root diam: 3.70 cm Ao Asc diam:  3.40 cm MITRAL VALVE                 TRICUSPID VALVE MV Area (PHT): 3.21 cm      TR Peak grad:   56.0 mmHg MV Area VTI:   1.48 cm      TR Vmax:        374.00 cm/s MV Peak grad:  22.1 mmHg MV Mean grad:  8.0 mmHg      SHUNTS MV Vmax:       2.35 m/s      Systemic VTI:  0.19 m MV Vmean:      133.0 cm/s    Systemic Diam: 2.10 cm MV Decel Time: 236 msec MR Peak grad:    70.6 mmHg MR Mean grad:    53.0 mmHg MR Vmax:         420.00 cm/s MR Vmean:        351.0 cm/s MR PISA:         4.02 cm MR PISA Eff ROA: 36 mm MR PISA Radius:  0.80 cm MV E velocity: 141.50 cm/s MV A velocity: 157.00 cm/s MV E/A ratio:  0.90 Laurance Flatten MD Electronically signed by Laurance Flatten MD Signature Date/Time: 03/04/2021/12:56:47 PM    Final (Updated)       Assessment/Plan:  INTERVAL HISTORY:O2 requirements are higher than yesterday   Active Problems:   Sepsis (HCC)   Bacteremia   Endocarditis of mitral valve   Pneumonia due to COVID-19 virus    Brandon [name] is a 70 y.o. male with prior history of COVID-19 infection smoldering symptoms with hindsight likely due to subacute bacterial endocarditis which she has now been proven to have on 2D echocardiogram with Enterococcus faecalis growing in blood.  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  Specific aspects of clinical management are critical to optimizing the outcome of patients with IE. Therefore, the Pharmacy Residency Program at [facility] has initiated an evidence-based consult aimed at improving the management of IE at [facility].     Comments  Consult to ID Outpatient follow-up appointment to be made prior to discharge.  Utilization of ID recommended antibiotic therapy  Surgery Evaluation Cardiothoracic surgery consult if the following indications are present:  - Heart failure associated to valve dysfunction - Endocarditis complicated by heart block or aortic abscess - Left-sided endocarditis caused by S. aureus, fungal, or multi-drug resistant organisms - Left-sided endocarditis with severe valve dysfunction - Large vegetation (>1 cm) on aortic or mitral valve - Persistence of infection 5-7 days after initiation of appropriate antibiotic therapy - Recurrent emboli and persistent vegetations despite appropriate antibiotic therapy - Any history with left-sided embolization with persistent vegetations on TEE  Consult electrophysiologist if presence of implanted cardiac device (pacemaker, ICD)   Consult to cardiology if left ventricular ejection fraction is <40% Evaluation for appropriate heart failure medications upon discharge: - ACE/ARB - Diuretic - Beta blocker  Outpatient follow-up appointment to be made prior to discharge.   Based on the ID MD's evaluation of this patient, the following are recommended to complete a thorough evaluation and care plan to reflect guideline-recommended therapy.   Infectious disease and cardiology consultation (done), CT surgery consultation.  If TEE is needed for CT surgery evaluation it should be expedited regardless of COVID 19 status of the patient  Continue ceftriaxone and ampicillin   #2  COVID 19: Second infection with this virus.  This would explain the subtle findings on CT scan.  He is on remdesivir currently  Given his increased O2 requirements I would support corticosteroids for him  I worry about his being able to get TEE w COVID+ but if he has perforated leaflet and large vegetation that does need to be evaluated promptly.     LOS: 2 days   Acey Lav 03/05/2021, 2:50 PM

## 2021-03-05 NOTE — Progress Notes (Signed)
Initial Nutrition Assessment  DOCUMENTATION CODES:   Not applicable  INTERVENTION:    Ensure Enlive po BID, each supplement provides 350 kcal and 20 grams of protein  MVI daily   NUTRITION DIAGNOSIS:   Increased nutrient needs related to acute illness as evidenced by estimated needs.  GOAL:   Patient will meet greater than or equal to 90% of their needs  MONITOR:   PO intake,Supplement acceptance,Weight trends,Labs,I & O's  REASON FOR ASSESSMENT:   Malnutrition Screening Tool    ASSESSMENT:   Patient with PMH significant for prior COVID PNA and HLD. Presents this admission with endocarditis with questionable underlying PNA.   TEE remarkable for mitral valve vegetation.   Unable to reach patient. Last meal completion charted as 10%. RD to provide supplementation to maximize kcal and protein this admission.  Weight noted to decline from 95 kg on 07/03/2020 to 85.3 kg this admission (10.2% wt loss in 8 months, insignificant for time frame).  Will need to obtain NFPE to assess for malnutrition.   UOP: 1325 ml x 24 hrs   Medications: 500 mg Vitamin C, Vitamin D, 220 mg zinc sulfate  Labs: Na 131 (L)  Diet Order:   Diet Order            Diet regular Room service appropriate? Yes; Fluid consistency: Thin  Diet effective now                 EDUCATION NEEDS:   Education needs have been addressed  Skin:  Skin Assessment: Reviewed RN Assessment  Last BM:  4/10  Height:   Ht Readings from Last 1 Encounters:  03/03/21 6\' 1"  (1.854 m)    Weight:   Wt Readings from Last 1 Encounters:  03/03/21 85.3 kg   BMI:  Body mass index is 24.81 kg/m.  Estimated Nutritional Needs:   Kcal:  2200-2400 kcal  Protein:  115-130 grams  Fluid:  >/= 2 L/day  05/03/21 RD, LDN Clinical Nutrition Pager listed in AMION

## 2021-03-05 NOTE — Consult Note (Signed)
Reason for Consult: Mitral valve endocarditis Referring Physician: Dr. Randol Kern, Dr. Daiva Eves  Brandon [name] is an 70 y.o. male.  HPI: Mr. Brandon [name] is a 70 year old male with a past medical history significant for Covid pneumonia in August 2021, hyperlipidemia, hypothyroidism, paroxysmal atrial fibrillation, and a prior hemopneumothorax on the left.  Brandon [name] presented with complaints of fevers, shortness of breath, and a persistent nonproductive cough.  Chest x-ray showed possible right upper lobe pneumonia.  CT angiogram of the chest showed no evidence of pulmonary embolus.  There was findings consistent with right upper lobe pneumonia.  Admission was recommended but Brandon [name] declined.  Brandon [name] went home on Augmentin and azithromycin.  His blood cultures later returned positive for Enterococcus and Brandon [name] came back for admission.  Brandon [name] was started on intravenous antibiotics.  Brandon [name] was noted to have a heart murmur.  An echocardiogram showed a mitral vegetation with moderate mitral regurgitation.  Brandon [name] currently is being treated with intravenous ampicillin and Rocephin.  Brandon [name] did test positive for Covid on 03/03/2021 after testing negative on 4/8.  Brandon [name] is being treated with remdesivir for that.  Currently still has some shortness of breath.  Brandon [name] is tachycardic at rest.  Oxygen saturations are 96% on 5 L nasal cannula.  Past Medical History:  Diagnosis Date  . Atrial fibrillation (HCC)   . BPH (benign prostatic hyperplasia)   . Cervical spine disease   . Chest pain 2007   none since  . Gallbladder disorder   . Hemopneumothorax on left   . Hiatal hernia   . Hypercholesteremia   . Hyperlipidemia   . Meniere disease   . Meniere's disease   . PONV (postoperative nausea and vomiting)    also has trouble waking up    Past Surgical History:  Procedure Laterality Date  . CARDIAC CATHETERIZATION    . COLONOSCOPY    . ORIF CLAVICULAR FRACTURE Left 04/18/2016   Procedure: OPEN REDUCTION INTERNAL FIXATION (ORIF) LEFT CLAVICLE  FRACTURE;  Surgeon: Francena Hanly, MD;  Location: [facility] OR;  Service: Orthopedics;  Laterality: Left;  . THYROIDECTOMY    . TONSILLECTOMY      Family History  Problem Relation Age of Onset  . Hypertension Mother   . Breast cancer Mother   . Heart disease Father   . Emphysema Father   . Heart attack Sister   . Heart attack Brother     Social History:  reports that Brandon [name] quit smoking about 41 years ago. Brandon [name] has never used smokeless tobacco. Brandon [name] reports current alcohol use. Brandon [name] reports that Brandon [name] does not use drugs.  Allergies:  Allergies  Allergen Reactions  . Ceftin [Cefuroxime Axetil] Diarrhea  . Other Other (See Comments)  . Oxycodone Nausea And Vomiting  . Prednisone & Diphenhydramine     Other reaction(s): Unknown  . Iohexol     Patient started sneezing during the injection and continued sneezing after exam. ER doc notified and patient was given benadryl and cortisone for this. 03/10/17.    Medications:  Scheduled: . vitamin C  500 mg Oral Daily  . Chlorhexidine Gluconate Cloth  6 each Topical Q0600  . cholecalciferol  1,000 Units Oral Daily  . dexamethasone  6 mg Oral Daily  . enoxaparin (LOVENOX) injection  40 mg Subcutaneous Q24H  . feeding supplement  237 mL Oral BID BM  . levothyroxine  175 mcg Oral QAC breakfast  . mouth rinse  15 mL Mouth Rinse BID  . multivitamin with minerals  1 tablet Oral Daily  .  mupirocin ointment  1 application Nasal BID  . zinc sulfate  220 mg Oral Daily    Results for orders placed or performed during the hospital encounter of 03/03/21 (from the past 48 hour(s))  TSH     Status: None   Collection Time: 03/04/21  7:37 AM  Result Value Ref Range   TSH 2.550 0.350 - 4.500 uIU/mL    Comment: Performed by a 3rd Generation assay with a functional sensitivity of <=0.01 uIU/mL. Performed at [facility], [address], [place], [place] [address]   Hepatitis B surface antigen     Status: None   Collection Time: 03/04/21  7:37 AM   Result Value Ref Range   Hepatitis B Surface Ag NON REACTIVE NON REACTIVE    Comment: Performed at [facility], [address], [place], [place] [address]  Hepatitis C antibody     Status: None   Collection Time: 03/04/21  7:37 AM  Result Value Ref Range   HCV Ab NON REACTIVE NON REACTIVE    Comment: (NOTE) Nonreactive HCV antibody screen is consistent with no HCV infections,  unless recent infection is suspected or other evidence exists to indicate HCV infection.  Performed at [facility], [address], [place], [place] [address]   Hepatitis B surface antibody,quantitative     Status: None   Collection Time: 03/04/21  7:37 AM  Result Value Ref Range   Hepatitis B-Post 85.9 Immunity>9.9 mIU/mL    Comment: (NOTE)  Status of Immunity                     Anti-HBs Level  ------------------                     -------------- Inconsistent with Immunity                   0.0 - 9.9 Consistent with Immunity                          >9.9 Performed At: [facility] [address] [place], [place] [id number] Jolene Schimke MD IO:[id number]   Hepatitis A antibody, total     Status: None   Collection Time: 03/04/21  7:37 AM  Result Value Ref Range   hep A Total Ab NON REACTIVE NON REACTIVE    Comment: Performed at [facility], [address] [place], [place] [address]  HIV Antibody (routine testing w rflx)     Status: None   Collection Time: 03/04/21  7:37 AM  Result Value Ref Range   HIV Screen 4th Generation wRfx Non Reactive Non Reactive    Comment: Performed at [facility], [address] [place], [place] [address]  Comprehensive metabolic panel     Status: Abnormal   Collection Time: 03/04/21  7:37 AM  Result Value Ref Range   Sodium 135 135 - 145 mmol/L   Potassium 3.6 3.5 - 5.1 mmol/L   Chloride 103 98 - 111 mmol/L   CO2 24 22 - 32 mmol/L   Glucose, Bld 95 70 - 99 mg/dL    Comment: Glucose reference range applies only to samples taken  after fasting for at least 8 hours.   BUN 23 8 - 23 mg/dL   Creatinine, Ser 3.66 0.61 - 1.24 mg/dL   Calcium 8.1 (L) 8.9 - 10.3 mg/dL   Total Protein 6.1 (L) 6.5 -  8.1 g/dL   Albumin 2.6 (L) 3.5 - 5.0 g/dL   AST 23 15 - 41 U/L   ALT 55 (H) 0 - 44 U/L   Alkaline Phosphatase 166 (H) 38 - 126 U/L   Total Bilirubin 0.6 0.3 - 1.2 mg/dL   GFR, Estimated >16 >10 mL/min    Comment: (NOTE) Calculated using the CKD-EPI Creatinine Equation (2021)    Anion gap 8 5 - 15    Comment: Performed at [facility], [address] [place], [place] [address]  CBC     Status: Abnormal   Collection Time: 03/04/21  7:37 AM  Result Value Ref Range   WBC 12.6 (H) 4.0 - 10.5 K/uL   RBC 3.52 (L) 4.22 - 5.81 MIL/uL   Hemoglobin 10.7 (L) 13.0 - 17.0 g/dL   HCT 40.9 (L) 81.1 - 91.4 %   MCV 94.6 80.0 - 100.0 fL   MCH 30.4 26.0 - 34.0 pg   MCHC 32.1 30.0 - 36.0 g/dL   RDW 78.2 95.6 - 21.3 %   Platelets 296 150 - 400 K/uL   nRBC 0.0 0.0 - 0.2 %    Comment: Performed at [facility], [address] [place], [place] [address]  C-reactive protein     Status: Abnormal   Collection Time: 03/04/21  7:37 AM  Result Value Ref Range   CRP 11.1 (H) <1.0 mg/dL    Comment: Performed at [facility], [address], [place], [place] [address]  Ferritin     Status: Abnormal   Collection Time: 03/04/21  7:37 AM  Result Value Ref Range   Ferritin 511 (H) 24 - 336 ng/mL    Comment: Performed at [facility], [address], [place], [place] [address]  MRSA PCR Screening     Status: Abnormal   Collection Time: 03/04/21 11:53 PM   Specimen: Nasopharyngeal  Result Value Ref Range   MRSA by PCR POSITIVE (A) NEGATIVE    Comment:        The GeneXpert MRSA Assay (FDA approved for NASAL specimens only), is one component of a comprehensive MRSA colonization surveillance program. It is not intended to diagnose MRSA infection nor to guide or monitor  treatment for MRSA infections. RESULT CALLED TO, READ BACK BY AND VERIFIED WITH: [facility] RN AT 0121 03/05/2021 MITCHELL,L Performed at [facility], [address] [place], [place] [address]   C-reactive protein     Status: Abnormal   Collection Time: 03/05/21 12:42 AM  Result Value Ref Range   CRP 20.8 (H) <1.0 mg/dL    Comment: Performed at [facility], [address], [place], [place] [address]  Ferritin     Status: Abnormal   Collection Time: 03/05/21 12:42 AM  Result Value Ref Range   Ferritin 615 (H) 24 - 336 ng/mL    Comment: Performed at [facility], [address] [place], [place] [address]  CBC     Status: Abnormal   Collection Time: 03/05/21 12:42 AM  Result Value Ref Range   WBC 13.7 (H) 4.0 - 10.5 K/uL   RBC 3.56 (L) 4.22 - 5.81 MIL/uL   Hemoglobin 11.1 (L) 13.0 - 17.0 g/dL   HCT 64.4 (L) 03.4 - 74.2 %   MCV 94.7 80.0 - 100.0 fL   MCH 31.2 26.0 - 34.0 pg   MCHC 32.9 30.0 - 36.0 g/dL   RDW 59.5 63.8 - 75.6 %  Platelets 325 150 - 400 K/uL   nRBC 0.0 0.0 - 0.2 %    Comment: Performed at [facility], [address] [place], [place] [address]  Comprehensive metabolic panel     Status: Abnormal   Collection Time: 03/05/21 12:42 AM  Result Value Ref Range   Sodium 131 (L) 135 - 145 mmol/L   Potassium 3.5 3.5 - 5.1 mmol/L   Chloride 100 98 - 111 mmol/L   CO2 24 22 - 32 mmol/L   Glucose, Bld 98 70 - 99 mg/dL    Comment: Glucose reference range applies only to samples taken after fasting for at least 8 hours.   BUN 17 8 - 23 mg/dL   Creatinine, Ser 6.04 0.61 - 1.24 mg/dL   Calcium 8.0 (L) 8.9 - 10.3 mg/dL   Total Protein 6.3 (L) 6.5 - 8.1 g/dL   Albumin 2.4 (L) 3.5 - 5.0 g/dL   AST 33 15 - 41 U/L   ALT 63 (H) 0 - 44 U/L   Alkaline Phosphatase 181 (H) 38 - 126 U/L   Total Bilirubin 1.0 0.3 - 1.2 mg/dL   GFR, Estimated >54 >09 mL/min    Comment: (NOTE) Calculated using the CKD-EPI Creatinine Equation (2021)    Anion gap 7 5 - 15     Comment: Performed at [facility], [address] [place], [place] [address]    ECHOCARDIOGRAM COMPLETE  Result Date: 03/04/2021    ECHOCARDIOGRAM REPORT   Patient Name:   ARRINGTON YOHE Date of Exam: 03/04/2021 Medical Rec #:  [id number]       Height:       73.0 in Accession #:    [id number]      Weight:       188.1 lb Date of Birth:  [birth year]       BSA:          2.096 m Patient Age:    69 years        BP:           137/77 mmHg Patient Gender: M               HR:           114 bpm. Exam Location:  Inpatient Procedure: 2D Echo, 3D Echo, Cardiac Doppler and Color Doppler Indications:     Bacteremia.  History:         Patient has no prior history of Echocardiogram examinations.                  Abnormal ECG, Arrythmias:Atrial Fibrillation,                  Signs/Symptoms:Bacteremia; Risk Factors:Hypertension and                  Dyslipidemia. Covid infection. Left pneumothorax.  Sonographer:     Sheralyn Boatman RDCS Referring Phys:  [id number] Scheryl Marten CHIU Diagnosing Phys: Laurance Flatten MD  Sonographer Comments: Technically difficult study. IMPRESSIONS  1. There is a mass present on the anterior mitral valve leaflet concerning for a vegetation that measures 1.4x1.0cm. Suspect moderate mitral regurgitation with two separate jets that are posteriorly and anteriorly directed raising concern for possible leaflet perforation.The mean gradient across the MV is (HR 100) with MVA by continuity of 1.35cm2 suggesting at least mild mitral stenosis in the setting of the mitral valve vegetation as detailed above. Recommend TEE for further evaluation.  2. Left ventricular ejection  fraction, by estimation, is 60 to 65%. The left ventricle has normal function. The left ventricle has no regional wall motion abnormalities. There is moderate asymmetric left ventricular hypertrophy of the basal-septal segment. There is mild LVH of the rest of the LV segments. Left ventricular diastolic parameters are consistent with Grade I  diastolic dysfunction (impaired relaxation).  3. Right ventricular systolic function is normal. The right ventricular size is normal. There is severely elevated pulmonary artery systolic pressure. The estimated right ventricular systolic pressure is 71.0 mmHg.  4. The aortic valve is tricuspid. There is mild calcification of the aortic valve. There is moderate thickening of the aortic valve. Aortic valve regurgitation is mild. Mild to moderate aortic valve sclerosis/calcification is present, without any evidence of aortic stenosis. No distinct vegetations visualized, but the valve is thickened.  5. The inferior vena cava is normal in size with greater than 50% respiratory variability, suggesting right atrial pressure of 3 mmHg. Comparison(s): No prior Echocardiogram. Conclusion(s)/Recommendation(s): Findings concerning for mitral valve vegetation, would recommend a Transesophageal Echocardiogram for clarification. FINDINGS  Left Ventricle: Left ventricular ejection fraction, by estimation, is 60 to 65%. The left ventricle has normal function. The left ventricle has no regional wall motion abnormalities. 3D left ventricular ejection fraction analysis performed but not reported based on interpreter judgement due to suboptimal quality. The left ventricular internal cavity size was normal in size. There is moderate asymmetric left ventricular hypertrophy of the basal-septal segment. Left ventricular diastolic parameters are consistent with Grade I diastolic dysfunction (impaired relaxation). There is flow acceleration in the LVOT, however, no systolic anterior motion visualized. Right Ventricle: The right ventricular size is normal. No increase in right ventricular wall thickness. Right ventricular systolic function is normal. There is severely elevated pulmonary artery systolic pressure. The tricuspid regurgitant velocity is 3.74 m/s, and with an assumed right atrial pressure of 15 mmHg, the estimated right ventricular  systolic pressure is 71.0 mmHg. Left Atrium: Left atrial size was normal in size. Right Atrium: Right atrial size was normal in size. Pericardium: There is no evidence of pericardial effusion. Mitral Valve: There is a mass present on the anterior mitral valve leaflet concerning for a vegetation that measures 1.4x1.0cm. Suspect moderate mitral regurgitation with two separate jets that are posteriorly and anteriorly directed raising concern for possible leaflet perforation.The mean gradient across the MV is with MVA by continuity of 1.35cm2 suggesting at least mild mitral stenosis in the setting of the mitral valve vegetation as detailed above. Recommend TEE for further evaluation. The mitral valve is abnormal. There is moderate thickening of the mitral valve leaflet(s). There is severe calcification of the mitral valve leaflet(s). Mild mitral annular calcification. Moderate mitral valve regurgitation. Mild to moderate mitral valve stenosis. MV peak gradient, 22.1 mmHg. The mean mitral valve gradient is 8.0 mmHg. Tricuspid Valve: The tricuspid valve is normal in structure. Tricuspid valve regurgitation is trivial. Aortic Valve: The aortic valve is tricuspid. There is mild calcification of the aortic valve. There is moderate thickening of the aortic valve. Aortic valve regurgitation is mild. Mild to moderate aortic valve sclerosis/calcification is present, without any evidence of aortic stenosis. Pulmonic Valve: The pulmonic valve was normal in structure. Pulmonic valve regurgitation is trivial. Aorta: The aortic root and ascending aorta are structurally normal, with no evidence of dilitation. Venous: The inferior vena cava is normal in size with greater than 50% respiratory variability, suggesting right atrial pressure of 3 mmHg. IAS/Shunts: No atrial level shunt detected by color flow Doppler.  LEFT VENTRICLE PLAX 2D LVIDd:         4.10 cm      Diastology LVIDs:         2.70 cm      LV e' medial:    9.79 cm/s  LV PW:         1.40 cm      LV E/e' medial:  14.5 LV IVS:        1.70 cm      LV e' lateral:   8.92 cm/s LVOT diam:     2.10 cm      LV E/e' lateral: 15.9 LV SV:         65 LV SV Index:   31 LVOT Area:     3.46 cm  LV Volumes (MOD) LV vol d, MOD A2C: 77.3 ml LV vol d, MOD A4C: 104.0 ml LV vol s, MOD A2C: 18.3 ml LV vol s, MOD A4C: 38.5 ml LV SV MOD A2C:     59.0 ml LV SV MOD A4C:     104.0 ml LV SV MOD BP:      72.3 ml RIGHT VENTRICLE             IVC RV S prime:     10.00 cm/s  IVC diam: 2.00 cm TAPSE (M-mode): 1.3 cm LEFT ATRIUM           Index       RIGHT ATRIUM           Index LA diam:      6.00 cm 2.86 cm/m  RA Area:     14.00 cm LA Vol (A2C): 72.1 ml 34.40 ml/m RA Volume:   26.60 ml  12.69 ml/m LA Vol (A4C): 51.1 ml 24.38 ml/m  AORTIC VALVE             PULMONIC VALVE LVOT Vmax:   109.00 cm/s PR End Diast Vel: 1.86 msec LVOT Vmean:  74.500 cm/s LVOT VTI:    0.188 m  AORTA Ao Root diam: 3.70 cm Ao Asc diam:  3.40 cm MITRAL VALVE                 TRICUSPID VALVE MV Area (PHT): 3.21 cm      TR Peak grad:   56.0 mmHg MV Area VTI:   1.48 cm      TR Vmax:        374.00 cm/s MV Peak grad:  22.1 mmHg MV Mean grad:  8.0 mmHg      SHUNTS MV Vmax:       2.35 m/s      Systemic VTI:  0.19 m MV Vmean:      133.0 cm/s    Systemic Diam: 2.10 cm MV Decel Time: 236 msec MR Peak grad:    70.6 mmHg MR Mean grad:    53.0 mmHg MR Vmax:         420.00 cm/s MR Vmean:        351.0 cm/s MR PISA:         4.02 cm MR PISA Eff ROA: 36 mm MR PISA Radius:  0.80 cm MV E velocity: 141.50 cm/s MV A velocity: 157.00 cm/s MV E/A ratio:  0.90 Laurance Flatten MD Electronically signed by Laurance Flatten MD Signature Date/Time: 03/04/2021/12:56:47 PM    Final (Updated)     Review of Systems  Constitutional: Positive for activity change and fatigue.  HENT: Negative for trouble swallowing and voice change.   Eyes:  Negative for visual disturbance.  Respiratory: Positive for cough and shortness of breath.   Cardiovascular: Negative for  chest pain.  Musculoskeletal: Positive for arthralgias and myalgias.   Blood pressure (!) 145/87, pulse (!) 104, temperature 98.7 F (37.1 C), temperature source Oral, resp. rate (!) 22, height 6\' 1"  (1.854 m), weight 85.3 kg, SpO2 96 %. Physical Exam Constitutional:      Appearance: Normal appearance. Brandon [name] is ill-appearing.  HENT:     Head: Normocephalic and atraumatic.  Eyes:     General: No scleral icterus. Neck:     Vascular: No carotid bruit.  Cardiovascular:     Rate and Rhythm: Regular rhythm. Tachycardia present.     Pulses: Normal pulses.     Heart sounds: Murmur (3/6 holosystolic) heard.    Pulmonary:     Effort: Respiratory distress (Mild) present.     Breath sounds: Rales present. No wheezing.     Comments: Increased work of breathing Abdominal:     General: There is no distension.     Palpations: Abdomen is soft.  Musculoskeletal:     Cervical back: Neck supple. No rigidity.  Skin:    General: Skin is warm and dry.  Neurological:     General: No focal deficit present.     Mental Status: Brandon [name] is alert and oriented to person, place, and time.     Cranial Nerves: No cranial nerve deficit.     Motor: No weakness.    Assessment/Plan: Rolland Steinert  is a 70 year old male with a past medical history significant for Covid pneumonia in August 2021, hyperlipidemia, hypothyroidism, paroxysmal atrial fibrillation, and a prior hemopneumothorax on the left.  Brandon [name] presented with shortness of breath, fatigue, and general malaise.  Brandon [name] also has a persistent nonproductive cough.  CT angio of the chest showed no evidence of pulmonary embolus but did show a right pneumonia.  Blood cultures grew Enterococcus which is sensitive to ampicillin.  A transesophageal echocardiogram showed a mitral valve vegetation with moderate regurgitation.  In addition to treatment of his mitral valve endocarditis, Brandon [name] also is being treated for possible community-acquired pneumonia and COVID-19 pneumonia.  There  is no urgent indication for surgery at present, particularly in light of his Covid pneumonia.  Brandon [name] does need a transesophageal echocardiogram to better evaluate his mitral valve vegetation severity of regurgitation.  Continue intravenous antibiotics Diuresis (6 L positive since admission) Transesophageal echocardiography   We will follow  11-30-1976 03/05/2021, 7:17 PM

## 2021-03-05 NOTE — Progress Notes (Signed)
   03/05/21 0800  Assess: MEWS Score  Temp 98.5 F (36.9 C)  BP 129/73  Pulse Rate (!) 113  ECG Heart Rate (!) 114  Resp (!) 30  Level of Consciousness Alert  SpO2 94 %  O2 Device Nasal Cannula  O2 Flow Rate (L/min) 5 L/min  Assess: MEWS Score  MEWS Temp 0  MEWS Systolic 0  MEWS Pulse 2  MEWS RR 2  MEWS LOC 0  MEWS Score 4  MEWS Score Color Red  Assess: if the MEWS score is Yellow or Red  Were vital signs taken at a resting state? Yes  Focused Assessment No change from prior assessment  Early Detection of Sepsis Score *See Row Information* Low  MEWS guidelines implemented *See Row Information* No, previously red, continue vital signs every 4 hours (pt has active COVID)  Treat  MEWS Interventions Administered scheduled meds/treatments  Pain Scale 0-10  Pain Score 0  Take Vital Signs  Increase Vital Sign Frequency  Red: Q 1hr X 4 then Q 4hr X 4, if remains red, continue Q 4hrs  Escalate  MEWS: Escalate Yellow: discuss with charge nurse/RN and consider discussing with provider and RRT  Notify: Charge Nurse/RN  Name of Charge Nurse/RN Notified Cresenda, RN  Date Charge Nurse/RN Notified 03/05/21  Time Charge Nurse/RN Notified 0815   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

## 2021-03-05 NOTE — Progress Notes (Signed)
PROGRESS NOTE    [name]  KVQ:[id number] DOB: [birth year] DOA: 03/03/2021 PCP: Merryl Hacker, No   Brief Narrative:   Brandon [name] is a 70 y.o. male with medical history significant of prior covid pna, hypothyroid, HLD initially presented to ED on 4/8 with complaints of fevers, sob, nonproductive cough.  During workup, pt was found to have evidence suggesting RUL pna. Pt was recommended admission, however, pt declined and was ultimately discharged from the ED on augmentin and azithromycin. Blood cultures later returned pos for enterococcus and pt was asked to return to ED. Pt reports generalized weakness, increased cough, decreased exercise tolerance. Also reports fevers - in the ED, pt was found to be tachycardic with tachypnea. WBC noted to be 17.6. Blood cx from 4/8 confirmed enterococcus. ID was consulted and hospitalist requested for admission.  Subjective:  He denies fever, chills, nausea or vomiting, but he does report significant coughing.  Assessment & Plan:   Active Problems:   Sepsis ([place])   Bacteremia   Endocarditis of mitral valve   Pneumonia due to COVID-19 virus  Enterococcus bacteremia concurrent endocarditis with questionable underlying pneumonia (CAP versus Covid) Sepsis POA - Presented with fevers, leukocytosis, tachycardia in the setting of Enterococcus bacteremia and endocarditis - ID following, appreciate insight recommendations currently on ampicillin and ceftriaxone -TTE remarkable for 1 x 1.5 cm vegetation on the mitral valve.  Consulted for TEE, as well consult placed for CT surgery..  Covid positive, notable pneumonia at intake, acute hypoxia  - Unclear if patient has acute Covid pneumonia versus bacterial pneumonia - Procalcitonin elevated but could be secondary to bacteremia and endocarditis.   - Patient is fully vaccinated, will had Covid infection in August 2021 . -Continue Remdesivir for full 5-day course given symptoms and transient hypoxia.  Currently  holding steroids in the setting of above . -Repeat trending up, continue to monitor closely.  Elevated AST/ALT/alk phos  -Appears to be somewhat chronic, mild, trending down-unclear baseline -Downtrending since admission likely shock liver in the setting of sepsis -2018 labs markedly elevated (AST439/ALT760/AlkP230) -unremarkable work-up at that time; considered for outpatient follow-up for hemochromatosis versus autoimmune work-up but does not appear to be any documentation in our system that this was ever done  Hypothyroid - TSH 2.5 - Continue home thyroid replacement  Normocytic anemia - Likely hemodilutional.  DVT prophylaxis: Lovenox Code Status: Full Family Communication: Discussed with daughter at bedside  Status is: Inpatient  Dispo: The patient is from: Home              Anticipated d/c is to: Home              Anticipated d/c date is: > 72h               Patient currently not medically stable for discharge given ongoing need for further medical work-up, possible surgical intervention and IV antibiotics.  Consultants:   Infectious disease, cardiology, CT surgery  Procedures:   TTE 03/04/2021  Antimicrobials:  Azithromycin -single dose 03/02/2021 Augmentin single dose 03/02/2021 Ampicillin 03/03/2021 -ongoing Ceftriaxone 2 g 03/03/21 -ongoing   Objective: Vitals:   03/04/21 2259 03/05/21 0000 03/05/21 0400 03/05/21 0800  BP: 138/84 123/81 140/77 129/73  Pulse: (!) 106 (!) 119 (!) 108 (!) 113  Resp: (!) 26 (!) 25 19 (!) 30  Temp:  99.7 F (37.6 C) 99 F (37.2 C) 98.5 F (36.9 C)  TempSrc:  Oral Oral Oral  SpO2: 96% 97% 95% 94%  Weight:  Height:        Intake/Output Summary (Last 24 hours) at 03/05/2021 1022 Last data filed at 03/05/2021 0807 Gross per 24 hour  Intake 3775.59 ml  Output 1575 ml  Net 2200.59 ml   Filed Weights   03/03/21 1835  Weight: 85.3 kg    Examination:  Awake Alert, Oriented X 3, No new F.N deficits, Normal  affect Symmetrical Chest wall movement, Good air movement bilaterally, CTAB RRR,No Gallops,Rubs , No Parasternal Heave +ve B.Sounds, Abd Soft, No tenderness, No rebound - guarding or rigidity. No Cyanosis, Clubbing or edema, No new Rash or bruise     Data Reviewed: I have personally reviewed following labs and imaging studies  CBC: Recent Labs  Lab 03/02/21 1138 03/03/21 1358 03/03/21 1907 03/04/21 0737 03/05/21 0042  WBC 14.1* 17.6* 16.7* 12.6* 13.7*  NEUTROABS 13.0* 15.4*  --   --   --   HGB 11.7* 12.4* 11.9* 10.7* 11.1*  HCT 36.2* 38.4* 37.8* 33.3* 33.7*  MCV 92.6 94.1 95.2 94.6 94.7  PLT 314 333 340 296 673   Basic Metabolic Panel: Recent Labs  Lab 03/02/21 1138 03/03/21 1358 03/03/21 1907 03/04/21 0737 03/05/21 0042  NA 133* 136  --  135 131*  K 3.7 4.0  --  3.6 3.5  CL 98 102  --  103 100  CO2 24 24  --  24 24  GLUCOSE 186* 99  --  95 98  BUN 21 28*  --  23 17  CREATININE 1.07 1.08 1.07 1.06 1.18  CALCIUM 8.7* 8.9  --  8.1* 8.0*   GFR: Estimated Creatinine Clearance: 67.7 mL/min (by C-G formula based on SCr of 1.18 mg/dL). Liver Function Tests: Recent Labs  Lab 03/02/21 1138 03/03/21 1358 03/04/21 0737 03/05/21 0042  AST 55* 39 23 33  ALT 85* 80* 55* 63*  ALKPHOS 243* 223* 166* 181*  BILITOT 0.9 0.8 0.6 1.0  PROT 7.5 7.6 6.1* 6.3*  ALBUMIN 3.0* 3.3* 2.6* 2.4*   Recent Labs  Lab 03/02/21 1138  LIPASE 27   No results for input(s): AMMONIA in the last 168 hours. Coagulation Profile: Recent Labs  Lab 03/02/21 1138 03/03/21 1515  INR 1.1 1.1   Cardiac Enzymes: Recent Labs  Lab 03/02/21 1138  CKTOTAL 14*   BNP (last 3 results) No results for input(s): PROBNP in the last 8760 hours. HbA1C: No results for input(s): HGBA1C in the last 72 hours. CBG: No results for input(s): GLUCAP in the last 168 hours. Lipid Profile: No results for input(s): CHOL, HDL, LDLCALC, TRIG, CHOLHDL, LDLDIRECT in the last 72 hours. Thyroid Function  Tests: Recent Labs    03/04/21 0737  TSH 2.550   Anemia Panel: Recent Labs    03/04/21 0737 03/05/21 0042  FERRITIN 511* 615*   Sepsis Labs: Recent Labs  Lab 03/02/21 1138 03/03/21 1358  LATICACIDVEN 1.8 1.1    Recent Results (from the past 240 hour(s))  Culture, blood (routine x 2)     Status: Abnormal   Collection Time: 03/02/21 11:50 AM   Specimen: BLOOD  Result Value Ref Range Status   Specimen Description   Final    BLOOD LEFT ANTECUBITAL Performed at [facility], [address] [place], [place] [address]    Special Requests   Final    BOTTLES DRAWN AEROBIC AND ANAEROBIC Blood Culture adequate volume Performed at [facility], [address] [place], [place] [address]    Culture  Setup Time  Final    GRAM POSITIVE COCCI IN BOTH AEROBIC AND ANAEROBIC BOTTLES CRITICAL VALUE NOTED.  VALUE IS CONSISTENT WITH PREVIOUSLY REPORTED AND CALLED VALUE.    Culture (A)  Final    ENTEROCOCCUS FAECALIS SUSCEPTIBILITIES PERFORMED ON PREVIOUS CULTURE WITHIN THE LAST 5 DAYS. Performed at [facility], [place] [address] [place], [place] [address]    Report Status 03/05/2021 FINAL  Final  Culture, blood (routine x 2)     Status: Abnormal   Collection Time: 03/02/21  1:10 PM   Specimen: Right Antecubital; Blood  Result Value Ref Range Status   Specimen Description   Final    RIGHT ANTECUBITAL BLOOD Performed at [facility], [address] [place], [place] [address]    Special Requests   Final    BOTTLES DRAWN AEROBIC AND ANAEROBIC Blood Culture adequate volume Performed at [facility], [address] [place], [place] [address]    Culture  Setup Time   Final    GRAM POSITIVE COCCI IN CHAINS IN BOTH AEROBIC AND ANAEROBIC BOTTLES CRITICAL RESULT CALLED TO, READ BACK BY AND VERIFIED WITH: Josph Macho RN '@1205'  03/03/21 EB Performed at [facility], [place] [address], [place], [place] [address]    Culture ENTEROCOCCUS  FAECALIS (A)  Final   Report Status 03/05/2021 FINAL  Final   Organism ID, Bacteria ENTEROCOCCUS FAECALIS  Final      Susceptibility   Enterococcus faecalis - MIC*    AMPICILLIN <=2 SENSITIVE Sensitive     VANCOMYCIN 1 SENSITIVE Sensitive     GENTAMICIN SYNERGY SENSITIVE Sensitive     * ENTEROCOCCUS FAECALIS  Blood Culture ID Panel (Reflexed)     Status: Abnormal   Collection Time: 03/02/21  1:10 PM  Result Value Ref Range Status   Enterococcus faecalis DETECTED (A) NOT DETECTED Final    Comment: CRITICAL RESULT CALLED TO, READ BACK BY AND VERIFIED WITH: S GOUGE RN '@1203'  03/03/21 EB    Enterococcus Faecium NOT DETECTED NOT DETECTED Final   Listeria monocytogenes NOT DETECTED NOT DETECTED Final   Staphylococcus species NOT DETECTED NOT DETECTED Final   Staphylococcus aureus (BCID) NOT DETECTED NOT DETECTED Final   Staphylococcus epidermidis NOT DETECTED NOT DETECTED Final   Staphylococcus lugdunensis NOT DETECTED NOT DETECTED Final   Streptococcus species NOT DETECTED NOT DETECTED Final   Streptococcus agalactiae NOT DETECTED NOT DETECTED Final   Streptococcus pneumoniae NOT DETECTED NOT DETECTED Final   Streptococcus pyogenes NOT DETECTED NOT DETECTED Final   A.calcoaceticus-baumannii NOT DETECTED NOT DETECTED Final   Bacteroides fragilis NOT DETECTED NOT DETECTED Final   Enterobacterales NOT DETECTED NOT DETECTED Final   Enterobacter cloacae complex NOT DETECTED NOT DETECTED Final   Escherichia coli NOT DETECTED NOT DETECTED Final   Klebsiella aerogenes NOT DETECTED NOT DETECTED Final   Klebsiella oxytoca NOT DETECTED NOT DETECTED Final   Klebsiella pneumoniae NOT DETECTED NOT DETECTED Final   Proteus species NOT DETECTED NOT DETECTED Final   Salmonella species NOT DETECTED NOT DETECTED Final   Serratia marcescens NOT DETECTED NOT DETECTED Final   Haemophilus influenzae NOT DETECTED NOT DETECTED Final   Neisseria meningitidis NOT DETECTED NOT DETECTED Final   Pseudomonas  aeruginosa NOT DETECTED NOT DETECTED Final   Stenotrophomonas maltophilia NOT DETECTED NOT DETECTED Final   Candida albicans NOT DETECTED NOT DETECTED Final   Candida auris NOT DETECTED NOT DETECTED Final   Candida glabrata NOT DETECTED NOT DETECTED Final   Candida krusei NOT DETECTED  NOT DETECTED Final   Candida parapsilosis NOT DETECTED NOT DETECTED Final   Candida tropicalis NOT DETECTED NOT DETECTED Final   Cryptococcus neoformans/gattii NOT DETECTED NOT DETECTED Final   Vancomycin resistance NOT DETECTED NOT DETECTED Final    Comment: Performed at [facility], [place] [address] [place], [place] [address]  Resp Panel by RT-PCR (Flu A&B, Covid) Nasopharyngeal Swab     Status: None   Collection Time: 03/02/21  3:41 PM   Specimen: Nasopharyngeal Swab; Nasopharyngeal(NP) swabs in vial transport medium  Result Value Ref Range Status   SARS Coronavirus 2 by RT PCR NEGATIVE NEGATIVE Final    Comment: (NOTE) SARS-CoV-2 target nucleic acids are NOT DETECTED.  The SARS-CoV-2 RNA is generally detectable in upper respiratory specimens during the acute phase of infection. The lowest concentration of SARS-CoV-2 viral copies this assay can detect is 138 copies/mL. A negative result does not preclude SARS-Cov-2 infection and should not be used as the sole basis for treatment or other patient management decisions. A negative result may occur with  improper specimen collection/handling, submission of specimen other than nasopharyngeal swab, presence of viral mutation(s) within the areas targeted by this assay, and inadequate number of viral copies(<138 copies/mL). A negative result must be combined with clinical observations, patient history, and epidemiological information. The expected result is Negative.  Fact Sheet for Patients:  EntrepreneurPulse.com.au  Fact Sheet for Healthcare Providers:  IncredibleEmployment.be  This test is no t yet approved  or cleared by the [place] FDA and  has been authorized for detection and/or diagnosis of SARS-CoV-2 by FDA under an Emergency Use Authorization (EUA). This EUA will remain  in effect (meaning this test can be used) for the duration of the COVID-19 declaration under Section 564(b)(1) of the Act, 21 U.S.C.section 360bbb-3(b)(1), unless the authorization is terminated  or revoked sooner.       Influenza A by PCR NEGATIVE NEGATIVE Final   Influenza B by PCR NEGATIVE NEGATIVE Final    Comment: (NOTE) The Xpert Xpress SARS-CoV-2/FLU/RSV plus assay is intended as an aid in the diagnosis of influenza from Nasopharyngeal swab specimens and should not be used as a sole basis for treatment. Nasal washings and aspirates are unacceptable for Xpert Xpress SARS-CoV-2/FLU/RSV testing.  Fact Sheet for Patients: EntrepreneurPulse.com.au  Fact Sheet for Healthcare Providers: IncredibleEmployment.be  This test is not yet approved or cleared by the [place] FDA and has been authorized for detection and/or diagnosis of SARS-CoV-2 by FDA under an Emergency Use Authorization (EUA). This EUA will remain in effect (meaning this test can be used) for the duration of the COVID-19 declaration under Section 564(b)(1) of the Act, 21 U.S.C. section 360bbb-3(b)(1), unless the authorization is terminated or revoked.  Performed at [facility], [address] [place], [place] [address]   Blood culture (routine x 2)     Status: None (Preliminary result)   Collection Time: 03/03/21  1:58 PM   Specimen: BLOOD  Result Value Ref Range Status   Specimen Description   Final    BLOOD LEFT ANTECUBITAL Performed at [facility] [address] [place], [place] [address]    Special Requests   Final    BOTTLES DRAWN AEROBIC AND ANAEROBIC Blood Culture adequate volume Performed at [facility] [address],  [place], [place] [address]    Culture   Final    NO GROWTH 2 DAYS Performed at [place],  [place] [address]    Report Status PENDING  Incomplete  Resp Panel by RT-PCR (Flu A&B, Covid) Nasopharyngeal Swab     Status: Abnormal   Collection Time: 03/03/21  3:08 PM   Specimen: Nasopharyngeal Swab; Nasopharyngeal(NP) swabs in vial transport medium  Result Value Ref Range Status   SARS Coronavirus 2 by RT PCR POSITIVE (A) NEGATIVE Final    Comment: RESULT CALLED TO, READ BACK BY AND VERIFIED WITH: BOWEN,M. RN AT 1628 03/03/21 MULLINS,T (NOTE) SARS-CoV-2 target nucleic acids are DETECTED.  The SARS-CoV-2 RNA is generally detectable in upper respiratory specimens during the acute phase of infection. Positive results are indicative of the presence of the identified virus, but do not rule out bacterial infection or co-infection with other pathogens not detected by the test. Clinical correlation with patient history and other diagnostic information is necessary to determine patient infection status. The expected result is Negative.  Fact Sheet for Patients: EntrepreneurPulse.com.au  Fact Sheet for Healthcare Providers: IncredibleEmployment.be  This test is not yet approved or cleared by the [place] FDA and  has been authorized for detection and/or diagnosis of SARS-CoV-2 by FDA under an Emergency Use Authorization (EUA).  This EUA will remain in effect (meaning this test c an be used) for the duration of  the COVID-19 declaration under Section 564(b)(1) of the Act, 21 U.S.C. section 360bbb-3(b)(1), unless the authorization is terminated or revoked sooner.     Influenza A by PCR NEGATIVE NEGATIVE Final   Influenza B by PCR NEGATIVE NEGATIVE Final    Comment: (NOTE) The Xpert Xpress SARS-CoV-2/FLU/RSV plus assay is intended as an aid in the diagnosis of influenza from Nasopharyngeal swab specimens and should not be  used as a sole basis for treatment. Nasal washings and aspirates are unacceptable for Xpert Xpress SARS-CoV-2/FLU/RSV testing.  Fact Sheet for Patients: EntrepreneurPulse.com.au  Fact Sheet for Healthcare Providers: IncredibleEmployment.be  This test is not yet approved or cleared by the [place] FDA and has been authorized for detection and/or diagnosis of SARS-CoV-2 by FDA under an Emergency Use Authorization (EUA). This EUA will remain in effect (meaning this test can be used) for the duration of the COVID-19 declaration under Section 564(b)(1) of the Act, 21 U.S.C. section 360bbb-3(b)(1), unless the authorization is terminated or revoked.  Performed at [facility], [place] [address], [place], [place] [address]   Urine culture     Status: None   Collection Time: 03/03/21  3:08 PM   Specimen: In/Out Cath Urine  Result Value Ref Range Status   Specimen Description   Final    IN/OUT CATH URINE Performed at [facility] [address] [place], [place] [address]    Special Requests   Final    NONE Performed at [facility], [place] [address], [place], [place] [address]    Culture   Final    NO GROWTH Performed at [facility], [place] [address], [place],  [address]    Report Status 03/04/2021 FINAL  Final  Blood culture (routine x 2)     Status: None (Preliminary result)   Collection Time: 03/03/21  3:41 PM   Specimen: BLOOD  Result Value Ref Range Status   Specimen Description   Final    BLOOD RIGHT ANTECUBITAL Performed at [place] [address] [place],  [address]    Special Requests   Final    BOTTLES DRAWN AEROBIC AND ANAEROBIC Blood Culture adequate volume Performed at [facility]  [place] [address] [place], [place] [address]    Culture   Final    NO GROWTH 2 DAYS Performed at [facility]  [address] [place], [place] [address]    Report Status PENDING  Incomplete  MRSA PCR Screening     Status: Abnormal   Collection Time: 03/04/21 11:53 PM   Specimen: Nasopharyngeal  Result Value Ref Range Status   MRSA by PCR POSITIVE (A) NEGATIVE Final    Comment:        The GeneXpert MRSA Assay (FDA approved for NASAL specimens only), is one component of a comprehensive MRSA colonization surveillance program. It is not intended to diagnose MRSA infection nor to guide or monitor treatment for MRSA infections. RESULT CALLED TO, READ BACK BY AND VERIFIED WITH: [facility] RN AT 0121 03/05/2021 MITCHELL,L Performed at [facility], [place] [address] [place], [place] [address]          Radiology Studies: DG Chest Port 1 View  Result Date: 03/03/2021 CLINICAL DATA:  Fever. EXAM: PORTABLE CHEST 1 VIEW COMPARISON:  03/02/2021 FINDINGS: 1535 hours. Patchy airspace disease noted right upper lung and right base. Interstitial markings are diffusely coarsened with chronic features. Bones are diffusely demineralized. Telemetry leads overlie the chest. IMPRESSION: Similar appearance of patchy airspace disease right upper and lower lung compatible with pneumonia. Chronic interstitial changes. Electronically Signed   By: Misty Stanley M.D.   On: 03/03/2021 16:03   ECHOCARDIOGRAM COMPLETE  Result Date: 03/04/2021    ECHOCARDIOGRAM REPORT   Patient Name:   Brandon [name] Date of Exam: 03/04/2021 Medical Rec #:  [id number]       Height:       73.0 in Accession #:    [id number]      Weight:       188.1 lb Date of Birth:  [birth year]       BSA:          2.096 m Patient Age:    77 years        BP:           137/77 mmHg Patient Gender: M               HR:           114 bpm. Exam Location:  Inpatient Procedure: 2D Echo, 3D Echo, Cardiac Doppler and Color Doppler Indications:     Bacteremia.  History:         Patient has no prior history of Echocardiogram examinations.                  Abnormal ECG, Arrythmias:Atrial  Fibrillation,                  Signs/Symptoms:Bacteremia; Risk Factors:Hypertension and                  Dyslipidemia. Covid infection. Left pneumothorax.  Sonographer:     Roseanna Rainbow RDCS Referring Phys:  [id number] CHIU Diagnosing Phys: Gwyndolyn Kaufman MD  Sonographer Comments: Technically difficult study. IMPRESSIONS  1. There is a mass present on the anterior mitral valve leaflet concerning for a vegetation that measures 1.4x1.0cm. Suspect moderate mitral regurgitation with two separate jets that are posteriorly and anteriorly directed raising concern for possible leaflet perforation.The mean gradient across the MV is 34mHg (HR 100) with MVA by continuity of 1.35cm2 suggesting at least mild mitral stenosis in the setting of the mitral valve vegetation as detailed  above. Recommend TEE for further evaluation.  2. Left ventricular ejection fraction, by estimation, is 60 to 65%. The left ventricle has normal function. The left ventricle has no regional wall motion abnormalities. There is moderate asymmetric left ventricular hypertrophy of the basal-septal segment. There is mild LVH of the rest of the LV segments. Left ventricular diastolic parameters are consistent with Grade I diastolic dysfunction (impaired relaxation).  3. Right ventricular systolic function is normal. The right ventricular size is normal. There is severely elevated pulmonary artery systolic pressure. The estimated right ventricular systolic pressure is 10.9 mmHg.  4. The aortic valve is tricuspid. There is mild calcification of the aortic valve. There is moderate thickening of the aortic valve. Aortic valve regurgitation is mild. Mild to moderate aortic valve sclerosis/calcification is present, without any evidence of aortic stenosis. No distinct vegetations visualized, but the valve is thickened.  5. The inferior vena cava is normal in size with greater than 50% respiratory variability, suggesting right atrial pressure of 3 mmHg.  Comparison(s): No prior Echocardiogram. Conclusion(s)/Recommendation(s): Findings concerning for mitral valve vegetation, would recommend a Transesophageal Echocardiogram for clarification. FINDINGS  Left Ventricle: Left ventricular ejection fraction, by estimation, is 60 to 65%. The left ventricle has normal function. The left ventricle has no regional wall motion abnormalities. 3D left ventricular ejection fraction analysis performed but not reported based on interpreter judgement due to suboptimal quality. The left ventricular internal cavity size was normal in size. There is moderate asymmetric left ventricular hypertrophy of the basal-septal segment. Left ventricular diastolic parameters are consistent with Grade I diastolic dysfunction (impaired relaxation). There is flow acceleration in the LVOT, however, no systolic anterior motion visualized. Right Ventricle: The right ventricular size is normal. No increase in right ventricular wall thickness. Right ventricular systolic function is normal. There is severely elevated pulmonary artery systolic pressure. The tricuspid regurgitant velocity is 3.74 m/s, and with an assumed right atrial pressure of 15 mmHg, the estimated right ventricular systolic pressure is 32.3 mmHg. Left Atrium: Left atrial size was normal in size. Right Atrium: Right atrial size was normal in size. Pericardium: There is no evidence of pericardial effusion. Mitral Valve: There is a mass present on the anterior mitral valve leaflet concerning for a vegetation that measures 1.4x1.0cm. Suspect moderate mitral regurgitation with two separate jets that are posteriorly and anteriorly directed raising concern for possible leaflet perforation.The mean gradient across the MV is 24mHg with MVA by continuity of 1.35cm2 suggesting at least mild mitral stenosis in the setting of the mitral valve vegetation as detailed above. Recommend TEE for further evaluation. The mitral valve is abnormal. There is  moderate thickening of the mitral valve leaflet(s). There is severe calcification of the mitral valve leaflet(s). Mild mitral annular calcification. Moderate mitral valve regurgitation. Mild to moderate mitral valve stenosis. MV peak gradient, 22.1 mmHg. The mean mitral valve gradient is 8.0 mmHg. Tricuspid Valve: The tricuspid valve is normal in structure. Tricuspid valve regurgitation is trivial. Aortic Valve: The aortic valve is tricuspid. There is mild calcification of the aortic valve. There is moderate thickening of the aortic valve. Aortic valve regurgitation is mild. Mild to moderate aortic valve sclerosis/calcification is present, without any evidence of aortic stenosis. Pulmonic Valve: The pulmonic valve was normal in structure. Pulmonic valve regurgitation is trivial. Aorta: The aortic root and ascending aorta are structurally normal, with no evidence of dilitation. Venous: The inferior vena cava is normal in size with greater than 50% respiratory variability, suggesting right atrial pressure of 3 mmHg.  IAS/Shunts: No atrial level shunt detected by color flow Doppler.  LEFT VENTRICLE PLAX 2D LVIDd:         4.10 cm      Diastology LVIDs:         2.70 cm      LV e' medial:    9.79 cm/s LV PW:         1.40 cm      LV E/e' medial:  14.5 LV IVS:        1.70 cm      LV e' lateral:   8.92 cm/s LVOT diam:     2.10 cm      LV E/e' lateral: 15.9 LV SV:         65 LV SV Index:   31 LVOT Area:     3.46 cm  LV Volumes (MOD) LV vol d, MOD A2C: 77.3 ml LV vol d, MOD A4C: 104.0 ml LV vol s, MOD A2C: 18.3 ml LV vol s, MOD A4C: 38.5 ml LV SV MOD A2C:     59.0 ml LV SV MOD A4C:     104.0 ml LV SV MOD BP:      72.3 ml RIGHT VENTRICLE             IVC RV S prime:     10.00 cm/s  IVC diam: 2.00 cm TAPSE (M-mode): 1.3 cm LEFT ATRIUM           Index       RIGHT ATRIUM           Index LA diam:      6.00 cm 2.86 cm/m  RA Area:     14.00 cm LA Vol (A2C): 72.1 ml 34.40 ml/m RA Volume:   26.60 ml  12.69 ml/m LA Vol (A4C): 51.1 ml  24.38 ml/m  AORTIC VALVE             PULMONIC VALVE LVOT Vmax:   109.00 cm/s PR End Diast Vel: 1.86 msec LVOT Vmean:  74.500 cm/s LVOT VTI:    0.188 m  AORTA Ao Root diam: 3.70 cm Ao Asc diam:  3.40 cm MITRAL VALVE                 TRICUSPID VALVE MV Area (PHT): 3.21 cm      TR Peak grad:   56.0 mmHg MV Area VTI:   1.48 cm      TR Vmax:        374.00 cm/s MV Peak grad:  22.1 mmHg MV Mean grad:  8.0 mmHg      SHUNTS MV Vmax:       2.35 m/s      Systemic VTI:  0.19 m MV Vmean:      133.0 cm/s    Systemic Diam: 2.10 cm MV Decel Time: 236 msec MR Peak grad:    70.6 mmHg MR Mean grad:    53.0 mmHg MR Vmax:         420.00 cm/s MR Vmean:        351.0 cm/s MR PISA:         4.02 cm MR PISA Eff ROA: 36 mm MR PISA Radius:  0.80 cm MV E velocity: 141.50 cm/s MV A velocity: 157.00 cm/s MV E/A ratio:  0.90 Gwyndolyn Kaufman MD Electronically signed by Gwyndolyn Kaufman MD Signature Date/Time: 03/04/2021/12:56:47 PM    Final (Updated)         Scheduled Meds: . vitamin C  500 mg  Oral Daily  . Chlorhexidine Gluconate Cloth  6 each Topical Q0600  . cholecalciferol  1,000 Units Oral Daily  . enoxaparin (LOVENOX) injection  40 mg Subcutaneous Q24H  . levothyroxine  175 mcg Oral QAC breakfast  . mouth rinse  15 mL Mouth Rinse BID  . mupirocin ointment  1 application Nasal BID  . zinc sulfate  220 mg Oral Daily   Continuous Infusions: . sodium chloride 75 mL/hr at 03/05/21 0600  . sodium chloride 10 mL/hr at 03/05/21 0600  . sodium chloride Stopped (03/05/21 0323)  . ampicillin (OMNIPEN) IV 2 g (03/05/21 0807)  . cefTRIAXone (ROCEPHIN)  IV 2 g (03/05/21 0829)  . remdesivir 100 mg in NS 100 mL 100 mg (03/05/21 1008)     LOS: 2 days    Phillips Climes, MD [facility]  If 7PM-7AM, please contact night-coverage [email or web address]  03/05/2021, 10:22 AM

## 2021-03-06 ENCOUNTER — Inpatient Hospital Stay (HOSPITAL_COMMUNITY): Payer: Medicare Other

## 2021-03-06 DIAGNOSIS — A4181 Sepsis due to Enterococcus: Secondary | ICD-10-CM | POA: Diagnosis not present

## 2021-03-06 DIAGNOSIS — R7881 Bacteremia: Secondary | ICD-10-CM | POA: Diagnosis not present

## 2021-03-06 DIAGNOSIS — I330 Acute and subacute infective endocarditis: Secondary | ICD-10-CM

## 2021-03-06 DIAGNOSIS — U071 COVID-19: Secondary | ICD-10-CM | POA: Diagnosis not present

## 2021-03-06 DIAGNOSIS — J1282 Pneumonia due to coronavirus disease 2019: Secondary | ICD-10-CM | POA: Diagnosis not present

## 2021-03-06 DIAGNOSIS — I059 Rheumatic mitral valve disease, unspecified: Secondary | ICD-10-CM | POA: Diagnosis not present

## 2021-03-06 DIAGNOSIS — A419 Sepsis, unspecified organism: Secondary | ICD-10-CM | POA: Diagnosis not present

## 2021-03-06 LAB — COMPREHENSIVE METABOLIC PANEL
ALT: 47 U/L — ABNORMAL HIGH (ref 0–44)
AST: 25 U/L (ref 15–41)
Albumin: 2.1 g/dL — ABNORMAL LOW (ref 3.5–5.0)
Alkaline Phosphatase: 159 U/L — ABNORMAL HIGH (ref 38–126)
Anion gap: 12 (ref 5–15)
BUN: 15 mg/dL (ref 8–23)
CO2: 21 mmol/L — ABNORMAL LOW (ref 22–32)
Calcium: 8.1 mg/dL — ABNORMAL LOW (ref 8.9–10.3)
Chloride: 99 mmol/L (ref 98–111)
Creatinine, Ser: 0.89 mg/dL (ref 0.61–1.24)
GFR, Estimated: >60 mL/min (ref >60)
Glucose, Bld: 116 mg/dL — ABNORMAL HIGH (ref 70–99)
Potassium: 3.4 mmol/L — ABNORMAL LOW (ref 3.5–5.1)
Sodium: 132 mmol/L — ABNORMAL LOW (ref 135–145)
Total Bilirubin: 1.3 mg/dL — ABNORMAL HIGH (ref 0.3–1.2)
Total Protein: 5.9 g/dL — ABNORMAL LOW (ref 6.5–8.1)

## 2021-03-06 LAB — CBC
HCT: 32.6 % — ABNORMAL LOW (ref 39.0–52.0)
Hemoglobin: 10.6 g/dL — ABNORMAL LOW (ref 13.0–17.0)
MCH: 30.6 pg (ref 26.0–34.0)
MCHC: 32.5 g/dL (ref 30.0–36.0)
MCV: 94.2 fL (ref 80.0–100.0)
Platelets: 287 10*3/uL (ref 150–400)
RBC: 3.46 MIL/uL — ABNORMAL LOW (ref 4.22–5.81)
RDW: 13.7 % (ref 11.5–15.5)
WBC: 16.2 10*3/uL — ABNORMAL HIGH (ref 4.0–10.5)
nRBC: 0 % (ref 0.0–0.2)

## 2021-03-06 LAB — BRAIN NATRIURETIC PEPTIDE: B Natriuretic Peptide: 142.6 pg/mL — ABNORMAL HIGH (ref 0.0–100.0)

## 2021-03-06 LAB — C-REACTIVE PROTEIN: CRP: 22.5 mg/dL — ABNORMAL HIGH (ref <1.0)

## 2021-03-06 LAB — PROCALCITONIN: Procalcitonin: 0.25 ng/mL

## 2021-03-06 MED ORDER — FUROSEMIDE 40 MG PO TABS
40.0000 mg | ORAL_TABLET | Freq: Every day | ORAL | Status: AC
  Administered 2021-03-07 – 2021-03-09 (×3): 40 mg via ORAL
  Filled 2021-03-06 (×3): qty 1

## 2021-03-06 MED ORDER — PANTOPRAZOLE SODIUM 40 MG PO TBEC
40.0000 mg | DELAYED_RELEASE_TABLET | Freq: Every day | ORAL | Status: DC
  Administered 2021-03-06 – 2021-03-15 (×10): 40 mg via ORAL
  Filled 2021-03-06 (×10): qty 1

## 2021-03-06 MED ORDER — FUROSEMIDE 10 MG/ML IJ SOLN
INTRAMUSCULAR | Status: AC
  Filled 2021-03-06: qty 2

## 2021-03-06 MED ORDER — POTASSIUM CHLORIDE CRYS ER 20 MEQ PO TBCR
30.0000 meq | EXTENDED_RELEASE_TABLET | Freq: Four times a day (QID) | ORAL | Status: AC
  Administered 2021-03-06 (×2): 30 meq via ORAL
  Filled 2021-03-06 (×2): qty 1

## 2021-03-06 MED ORDER — IPRATROPIUM-ALBUTEROL 0.5-2.5 (3) MG/3ML IN SOLN: 3.0000 mL | RESPIRATORY_TRACT | Status: DC | PRN

## 2021-03-06 MED ORDER — METHYLPREDNISOLONE SODIUM SUCC 125 MG IJ SOLR
60.0000 mg | Freq: Two times a day (BID) | INTRAMUSCULAR | Status: DC
  Administered 2021-03-06 – 2021-03-14 (×17): 60 mg via INTRAVENOUS
  Filled 2021-03-06 (×19): qty 2

## 2021-03-06 MED ORDER — FUROSEMIDE 20 MG PO TABS: 20.0000 mg | ORAL_TABLET | Freq: Every day | ORAL | Status: DC

## 2021-03-06 MED ORDER — FUROSEMIDE 10 MG/ML IJ SOLN
40.0000 mg | Freq: Once | INTRAMUSCULAR | Status: AC
  Administered 2021-03-06: 40 mg via INTRAVENOUS

## 2021-03-06 MED ORDER — FUROSEMIDE 10 MG/ML IJ SOLN
20.0000 mg | Freq: Once | INTRAMUSCULAR | Status: DC
  Filled 2021-03-06: qty 2

## 2021-03-06 NOTE — Progress Notes (Addendum)
PROGRESS NOTE    Brandon [name]  GFR:[id number] DOB: [birth year] DOA: 03/03/2021 PCP: Kristen Loader, FNP   Brief Narrative:   Brandon [name] is a 70 y.o. male with medical history significant of prior covid pna, hypothyroid, HLD initially presented to ED on 4/8 with complaints of fevers, sob, nonproductive cough.  During workup, pt was found to have evidence suggesting RUL pna. Pt was recommended admission, however, pt declined and was ultimately discharged from the ED on augmentin and azithromycin. Blood cultures later returned pos for enterococcus and pt was asked to return to ED. Pt reports generalized weakness, increased cough, decreased exercise tolerance. Also reports fevers - in the ED, pt was found to be tachycardic with tachypnea. WBC noted to be 17.6. Blood cx from 4/8 confirmed enterococcus. ID was consulted and hospitalist requested for admission.  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  As well he is diagnosed with COVID-19 of pneumonia, he is vaccinated, and had previous infection last August.  Subjective:  She denies any fever, chills, but he reports worsening dyspnea, he still complains of nagging cough.    Assessment & Plan:   Active Problems:   Sepsis ([place])   Bacteremia   Endocarditis of mitral valve   Pneumonia due to COVID-19 virus  Enterococcus bacteremia concurrent endocarditis with questionable underlying pneumonia (CAP versus Covid) Sepsis POA - Presented with fevers, leukocytosis, tachycardia in the setting of Enterococcus bacteremia and endocarditis - ID following, appreciate insight recommendations currently on ampicillin and ceftriaxone -TTE remarkable for 1 x 1.5 cm vegetation on the mitral valve.  Cardiology consulted for TEE, timing per cardiology . -CT surgery consulted as well, further recommendation pending TEE and improvement of  COVID-19.  Discussed with cardiology, plan for TEE on Thursday at 2 PM.  Acute hypoxic respiratory failure due to COVID-19 of pneumonia . - Unclear if patient has acute Covid pneumonia versus bacterial pneumonia - Procalcitonin elevated but could be secondary to bacteremia and endocarditis.  We will continue to trend. - Patient is fully vaccinated, as well had Covid infection in August 2021 . -Continue Remdesivir for full 5-day course . -He is with worsening hypoxia this morning, fluctuating between 4 to 7 L nasal cannula, chest x-ray showing worsening bilateral pneumonia, will increase p.o. Decadron 6 mg to IV Solu-Medrol 60 mg IV twice daily . -Given his endocarditis, and bacteremia, cannot receive baricitinib or Actemra. -As well some evidence of volume overload, will check BNP and give IV Lasix.  Elevated AST/ALT/alk phos  -Appears to be somewhat chronic, mild, trending down-unclear baseline -Downtrending since admission likely shock liver in the setting of sepsis as well due to Covid. -2018 labs markedly elevated (AST439/ALT760/AlkP230) -unremarkable work-up at that time; considered for outpatient follow-up for hemochromatosis versus autoimmune work-up but does not appear to be any documentation in our system that this was ever done  Hypothyroid - TSH 2.5 - Continue home thyroid replacement  Normocytic anemia - Likely hemodilutional.  DVT prophylaxis: Lovenox Code Status: Full Family Communication: None at bedside at bedside  Status is: Inpatient  Dispo: The patient is from: Home              Anticipated d/c is to: Home              Anticipated d/c date is: > 72h  Patient currently not medically stable for discharge given ongoing need for further medical work-up, possible surgical intervention and IV antibiotics.  Consultants:   Infectious disease, cardiology called for TEE, CT surgery  Procedures:   TTE 03/04/2021  Antimicrobials:  Azithromycin -single  dose 03/02/2021 Augmentin single dose 03/02/2021 Ampicillin 03/03/2021 -ongoing Ceftriaxone 2 g 03/03/21 -ongoing   Objective: Vitals:   03/05/21 1127 03/05/21 1958 03/06/21 0000 03/06/21 0400  BP: (!) 145/87  138/82 133/72  Pulse: (!) 104 (!) 114 (!) 118 (!) 109  Resp: (!) 22 (!) 22 (!) 21 (!) 28  Temp: 98.7 F (37.1 C) 98.6 F (37 C) 97.8 F (36.6 C) 98.7 F (37.1 C)  TempSrc: Oral Oral Oral Oral  SpO2: 96% 97%  97%  Weight:      Height:        Intake/Output Summary (Last 24 hours) at 03/06/2021 6387 Last data filed at 03/06/2021 0700 Gross per 24 hour  Intake 2511.56 ml  Output 950 ml  Net 1561.56 ml   Filed Weights   03/03/21 1835  Weight: 85.3 kg    Examination:  Awake Alert, Oriented X 3, No new F.N deficits, Normal affect Symmetrical Chest wall movement, Good air movement bilaterally, bibasilar rales, mildly tachypneic RRR,No Gallops,Rubs ,+ Murmurs, No Parasternal Heave +ve B.Sounds, Abd Soft, No tenderness, No rebound - guarding or rigidity. No Cyanosis, Clubbing ,trace edema, No new Rash or bruise     Data Reviewed: I have personally reviewed following labs and imaging studies  CBC: Recent Labs  Lab 03/02/21 1138 03/03/21 1358 03/03/21 1907 03/04/21 0737 03/05/21 0042 03/06/21 0057  WBC 14.1* 17.6* 16.7* 12.6* 13.7* 16.2*  NEUTROABS 13.0* 15.4*  --   --   --   --   HGB 11.7* 12.4* 11.9* 10.7* 11.1* 10.6*  HCT 36.2* 38.4* 37.8* 33.3* 33.7* 32.6*  MCV 92.6 94.1 95.2 94.6 94.7 94.2  PLT 314 333 340 296 325 564   Basic Metabolic Panel: Recent Labs  Lab 03/02/21 1138 03/03/21 1358 03/03/21 1907 03/04/21 0737 03/05/21 0042 03/06/21 0057  NA 133* 136  --  135 131* 132*  K 3.7 4.0  --  3.6 3.5 3.4*  CL 98 102  --  103 100 99  CO2 24 24  --  24 24 21*  GLUCOSE 186* 99  --  95 98 116*  BUN 21 28*  --  _0 CREATININE 1.07 1.08 1.07 1.06 1.18 0.89  CALCIUM 8.7* 8.9  --  8.1* 8.0* 8.1*   GFR: Estimated Creatinine Clearance: 89.8 mL/min (by C-G  formula based on SCr of 0.89 mg/dL). Liver Function Tests: Recent Labs  Lab 03/02/21 1138 03/03/21 1358 03/04/21 0737 03/05/21 0042 03/06/21 0057  AST 55* 39 23 33 25  ALT 85* 80* 55* 63* 47*  ALKPHOS 243* 223* 166* 181* 159*  BILITOT 0.9 0.8 0.6 1.0 1.3*  PROT 7.5 7.6 6.1* 6.3* 5.9*  ALBUMIN 3.0* 3.3* 2.6* 2.4* 2.1*   Recent Labs  Lab 03/02/21 1138  LIPASE 27   No results for input(s): AMMONIA in the last 168 hours. Coagulation Profile: Recent Labs  Lab 03/02/21 1138 03/03/21 1515  INR 1.1 1.1   Cardiac Enzymes: Recent Labs  Lab 03/02/21 1138  CKTOTAL 14*   BNP (last 3 results) No results for input(s): PROBNP in the last 8760 hours. HbA1C: No results for input(s): HGBA1C in the last 72 hours. CBG: No results for input(s): GLUCAP in the last 168 hours. Lipid Profile: No results  for input(s): CHOL, HDL, LDLCALC, TRIG, CHOLHDL, LDLDIRECT in the last 72 hours. Thyroid Function Tests: Recent Labs    03/04/21 0737  TSH 2.550   Anemia Panel: Recent Labs    03/04/21 0737 03/05/21 0042  FERRITIN 511* 615*   Sepsis Labs: Recent Labs  Lab 03/02/21 1138 03/03/21 1358 03/06/21 0057  PROCALCITON  --   --  0.25  LATICACIDVEN 1.8 1.1  --     Recent Results (from the past 240 hour(s))  Culture, blood (routine x 2)     Status: Abnormal   Collection Time: 03/02/21 11:50 AM   Specimen: BLOOD  Result Value Ref Range Status   Specimen Description   Final    BLOOD LEFT ANTECUBITAL Performed at [facility], [address] [place], [place] [address]    Special Requests   Final    BOTTLES DRAWN AEROBIC AND ANAEROBIC Blood Culture adequate volume Performed at [facility], [address] [place], [place] [address]    Culture  Setup Time   Final    GRAM POSITIVE COCCI IN BOTH AEROBIC AND ANAEROBIC BOTTLES CRITICAL VALUE NOTED.  VALUE IS CONSISTENT WITH PREVIOUSLY REPORTED AND CALLED VALUE.    Culture (A)  Final    ENTEROCOCCUS  FAECALIS SUSCEPTIBILITIES PERFORMED ON PREVIOUS CULTURE WITHIN THE LAST 5 DAYS. Performed at [facility], [place] [address], [place], [place] [address]    Report Status 03/05/2021 FINAL  Final  Culture, blood (routine x 2)     Status: Abnormal   Collection Time: 03/02/21  1:10 PM   Specimen: Right Antecubital; Blood  Result Value Ref Range Status   Specimen Description   Final    RIGHT ANTECUBITAL BLOOD Performed at [facility], [address] [place], [place] [address]    Special Requests   Final    BOTTLES DRAWN AEROBIC AND ANAEROBIC Blood Culture adequate volume Performed at [facility], [address] [place], [place] [address]    Culture  Setup Time   Final    GRAM POSITIVE COCCI IN CHAINS IN BOTH AEROBIC AND ANAEROBIC BOTTLES CRITICAL RESULT CALLED TO, READ BACK BY AND VERIFIED WITH: Josph Macho RN _0  03/03/21 EB Performed at [facility], [place] [address], [place], [place] [address]    Culture ENTEROCOCCUS FAECALIS (A)  Final   Report Status 03/05/2021 FINAL  Final   Organism ID, Bacteria ENTEROCOCCUS FAECALIS  Final      Susceptibility   Enterococcus faecalis - MIC*    AMPICILLIN <=2 SENSITIVE Sensitive     VANCOMYCIN 1 SENSITIVE Sensitive     GENTAMICIN SYNERGY SENSITIVE Sensitive     * ENTEROCOCCUS FAECALIS  Blood Culture ID Panel (Reflexed)     Status: Abnormal   Collection Time: 03/02/21  1:10 PM  Result Value Ref Range Status   Enterococcus faecalis DETECTED (A) NOT DETECTED Final    Comment: CRITICAL RESULT CALLED TO, READ BACK BY AND VERIFIED WITH: S GOUGE RN _1  03/03/21 EB    Enterococcus Faecium NOT DETECTED NOT DETECTED Final   Listeria monocytogenes NOT DETECTED NOT DETECTED Final   Staphylococcus species NOT DETECTED NOT DETECTED Final   Staphylococcus aureus (BCID) NOT DETECTED NOT DETECTED Final   Staphylococcus epidermidis NOT DETECTED NOT DETECTED Final   Staphylococcus lugdunensis NOT DETECTED NOT DETECTED Final    Streptococcus species NOT DETECTED NOT DETECTED Final   Streptococcus agalactiae NOT DETECTED NOT DETECTED Final   Streptococcus  pneumoniae NOT DETECTED NOT DETECTED Final   Streptococcus pyogenes NOT DETECTED NOT DETECTED Final   A.calcoaceticus-baumannii NOT DETECTED NOT DETECTED Final   Bacteroides fragilis NOT DETECTED NOT DETECTED Final   Enterobacterales NOT DETECTED NOT DETECTED Final   Enterobacter cloacae complex NOT DETECTED NOT DETECTED Final   Escherichia coli NOT DETECTED NOT DETECTED Final   Klebsiella aerogenes NOT DETECTED NOT DETECTED Final   Klebsiella oxytoca NOT DETECTED NOT DETECTED Final   Klebsiella pneumoniae NOT DETECTED NOT DETECTED Final   Proteus species NOT DETECTED NOT DETECTED Final   Salmonella species NOT DETECTED NOT DETECTED Final   Serratia marcescens NOT DETECTED NOT DETECTED Final   Haemophilus influenzae NOT DETECTED NOT DETECTED Final   Neisseria meningitidis NOT DETECTED NOT DETECTED Final   Pseudomonas aeruginosa NOT DETECTED NOT DETECTED Final   Stenotrophomonas maltophilia NOT DETECTED NOT DETECTED Final   Candida albicans NOT DETECTED NOT DETECTED Final   Candida auris NOT DETECTED NOT DETECTED Final   Candida glabrata NOT DETECTED NOT DETECTED Final   Candida krusei NOT DETECTED NOT DETECTED Final   Candida parapsilosis NOT DETECTED NOT DETECTED Final   Candida tropicalis NOT DETECTED NOT DETECTED Final   Cryptococcus neoformans/gattii NOT DETECTED NOT DETECTED Final   Vancomycin resistance NOT DETECTED NOT DETECTED Final    Comment: Performed at [facility], [address] [name], [place] [address]  Resp Panel by RT-PCR (Flu A&B, Covid) Nasopharyngeal Swab     Status: None   Collection Time: 03/02/21  3:41 PM   Specimen: Nasopharyngeal Swab; Nasopharyngeal(NP) swabs in vial transport medium  Result Value Ref Range Status   SARS Coronavirus 2 by RT PCR NEGATIVE NEGATIVE Final    Comment: (NOTE) SARS-CoV-2 target nucleic acids  are NOT DETECTED.  The SARS-CoV-2 RNA is generally detectable in upper respiratory specimens during the acute phase of infection. The lowest concentration of SARS-CoV-2 viral copies this assay can detect is 138 copies/mL. A negative result does not preclude SARS-Cov-2 infection and should not be used as the sole basis for treatment or other patient management decisions. A negative result may occur with  improper specimen collection/handling, submission of specimen other than nasopharyngeal swab, presence of viral mutation(s) within the areas targeted by this assay, and inadequate number of viral copies(<138 copies/mL). A negative result must be combined with clinical observations, patient history, and epidemiological information. The expected result is Negative.  Fact Sheet for Patients:  EntrepreneurPulse.com.au  Fact Sheet for Healthcare Providers:  IncredibleEmployment.be  This test is no t yet approved or cleared by the [place] FDA and  has been authorized for detection and/or diagnosis of SARS-CoV-2 by FDA under an Emergency Use Authorization (EUA). This EUA will remain  in effect (meaning this test can be used) for the duration of the COVID-19 declaration under Section 564(b)(1) of the Act, 21 U.S.C.section 360bbb-3(b)(1), unless the authorization is terminated  or revoked sooner.       Influenza A by PCR NEGATIVE NEGATIVE Final   Influenza B by PCR NEGATIVE NEGATIVE Final    Comment: (NOTE) The Xpert Xpress SARS-CoV-2/FLU/RSV plus assay is intended as an aid in the diagnosis of influenza from Nasopharyngeal swab specimens and should not be used as a sole basis for treatment. Nasal washings and aspirates are unacceptable for Xpert Xpress SARS-CoV-2/FLU/RSV testing.  Fact Sheet for Patients: EntrepreneurPulse.com.au  Fact Sheet for Healthcare Providers: IncredibleEmployment.be  This test is  not yet approved or cleared by the [place] and has been authorized  for detection and/or diagnosis of SARS-CoV-2 by FDA under an Emergency Use Authorization (EUA). This EUA will remain in effect (meaning this test can be used) for the duration of the COVID-19 declaration under Section 564(b)(1) of the Act, 21 U.S.C. section 360bbb-3(b)(1), unless the authorization is terminated or revoked.  Performed at [facility], [address] [place], [place] [address]   Blood culture (routine x 2)     Status: None (Preliminary result)   Collection Time: 03/03/21  1:58 PM   Specimen: BLOOD  Result Value Ref Range Status   Specimen Description   Final    BLOOD LEFT ANTECUBITAL Performed at [facility] [address] [place], [place] [address]    Special Requests   Final    BOTTLES DRAWN AEROBIC AND ANAEROBIC Blood Culture adequate volume Performed at [facility] [address], [place], [place] [address]    Culture   Final    NO GROWTH 3 DAYS Performed at [facility], [place] [address], [place], [place] [address]    Report Status PENDING  Incomplete  Resp Panel by RT-PCR (Flu A&B, Covid) Nasopharyngeal Swab     Status: Abnormal   Collection Time: 03/03/21  3:08 PM   Specimen: Nasopharyngeal Swab; Nasopharyngeal(NP) swabs in vial transport medium  Result Value Ref Range Status   SARS Coronavirus 2 by RT PCR POSITIVE (A) NEGATIVE Final    Comment: RESULT CALLED TO, READ BACK BY AND VERIFIED WITH: BOWEN,M. RN AT 1628 03/03/21 MULLINS,T (NOTE) SARS-CoV-2 target nucleic acids are DETECTED.  The SARS-CoV-2 RNA is generally detectable in upper respiratory specimens during the acute phase of infection. Positive results are indicative of the presence of the identified virus, but do not rule out bacterial infection or co-infection with other pathogens not detected by the test. Clinical correlation with patient history and other  diagnostic information is necessary to determine patient infection status. The expected result is Negative.  Fact Sheet for Patients: EntrepreneurPulse.com.au  Fact Sheet for Healthcare Providers: IncredibleEmployment.be  This test is not yet approved or cleared by the [place] FDA and  has been authorized for detection and/or diagnosis of SARS-CoV-2 by FDA under an Emergency Use Authorization (EUA).  This EUA will remain in effect (meaning this test c an be used) for the duration of  the COVID-19 declaration under Section 564(b)(1) of the Act, 21 U.S.C. section 360bbb-3(b)(1), unless the authorization is terminated or revoked sooner.     Influenza A by PCR NEGATIVE NEGATIVE Final   Influenza B by PCR NEGATIVE NEGATIVE Final    Comment: (NOTE) The Xpert Xpress SARS-CoV-2/FLU/RSV plus assay is intended as an aid in the diagnosis of influenza from Nasopharyngeal swab specimens and should not be used as a sole basis for treatment. Nasal washings and aspirates are unacceptable for Xpert Xpress SARS-CoV-2/FLU/RSV testing.  Fact Sheet for Patients: EntrepreneurPulse.com.au  Fact Sheet for Healthcare Providers: IncredibleEmployment.be  This test is not yet approved or cleared by the [place] FDA and has been authorized for detection and/or diagnosis of SARS-CoV-2 by FDA under an Emergency Use Authorization (EUA). This EUA will remain in effect (meaning this test can be used) for the duration of the COVID-19 declaration under Section 564(b)(1) of the Act, 21 U.S.C. section 360bbb-3(b)(1), unless the authorization is terminated or revoked.  Performed at [facility], [place] [address] [place], [place] [address]   Urine culture     Status: None   Collection Time:  03/03/21  3:08 PM   Specimen: In/Out Cath Urine  Result Value Ref Range Status   Specimen Description   Final    IN/OUT  CATH URINE Performed at [facility], [place] [address], [place], [place] [address]    Special Requests   Final    NONE Performed at [facility], [place] [address] [place], [place] [address]    Culture   Final    NO GROWTH Performed at [facility], [place] [address] [place], [place] [address]    Report Status 03/04/2021 FINAL  Final  Blood culture (routine x 2)     Status: None (Preliminary result)   Collection Time: 03/03/21  3:41 PM   Specimen: BLOOD  Result Value Ref Range Status   Specimen Description   Final    BLOOD RIGHT ANTECUBITAL Performed at [facility] [address] [place], [place] [address]    Special Requests   Final    BOTTLES DRAWN AEROBIC AND ANAEROBIC Blood Culture adequate volume Performed at [facility] [facility] [address], [place], [place] [address]    Culture   Final    NO GROWTH 3 DAYS Performed at [facility], [place] [address], [place], [place] [address]    Report Status PENDING  Incomplete  MRSA PCR Screening     Status: Abnormal   Collection Time: 03/04/21 11:53 PM   Specimen: Nasopharyngeal  Result Value Ref Range Status   MRSA by PCR POSITIVE (A) NEGATIVE Final    Comment:        The GeneXpert MRSA Assay (FDA approved for NASAL specimens only), is one component of a comprehensive MRSA colonization surveillance program. It is not intended to diagnose MRSA infection nor to guide or monitor treatment for MRSA infections. RESULT CALLED TO, READ BACK BY AND VERIFIED WITH: [facility] RN AT 0121 03/05/2021 MITCHELL,L Performed at [facility], [place] [address], [place], [place] [address]          Radiology Studies: DG Chest [place] 1 View  Result Date: 03/06/2021 CLINICAL DATA:  70 year old male positive COVID-19 on 03/03/2021. Shortness of breath. EXAM: PORTABLE CHEST 1 VIEW COMPARISON:  Portable chest 03/03/2021. FINDINGS: Portable AP semi upright view at  0830 hours. Progressed and extensive airspace opacity now in the right upper lobe, about both hila. Left lung apex and the lateral costophrenic angles remain relatively spared. Stable lung volumes and mediastinal contours. No pneumothorax. Visualized tracheal air column is within normal limits. Prior thyroidectomy and left clavicle ORIF. IMPRESSION: Progressed bilateral pneumonia, now moderate to severe. No pneumothorax or pleural effusion. Electronically Signed   By: Genevie Ann M.D.   On: 03/06/2021 08:34   ECHOCARDIOGRAM COMPLETE  Result Date: 03/04/2021    ECHOCARDIOGRAM REPORT   Patient Name:   PONCIANO SHEALY Date of Exam: 03/04/2021 Medical Rec #:  [id number]       Height:       73.0 in Accession #:    [id number]      Weight:       188.1 lb Date of Birth:  1951/10/23       BSA:          2.096 m Patient Age:    42 years        BP:           137/77 mmHg Patient Gender: M               HR:  114 bpm. Exam Location:  Inpatient Procedure: 2D Echo, 3D Echo, Cardiac Doppler and Color Doppler Indications:     Bacteremia.  History:         Patient has no prior history of Echocardiogram examinations.                  Abnormal ECG, Arrythmias:Atrial Fibrillation,                  Signs/Symptoms:Bacteremia; Risk Factors:Hypertension and                  Dyslipidemia. Covid infection. Left pneumothorax.  Sonographer:     Roseanna Rainbow RDCS Referring Phys:  [id number] CHIU Diagnosing Phys: Gwyndolyn Kaufman MD  Sonographer Comments: Technically difficult study. IMPRESSIONS  1. There is a mass present on the anterior mitral valve leaflet concerning for a vegetation that measures 1.4x1.0cm. Suspect moderate mitral regurgitation with two separate jets that are posteriorly and anteriorly directed raising concern for possible leaflet perforation.The mean gradient across the MV is 15mHg (HR 100) with MVA by continuity of 1.35cm2 suggesting at least mild mitral stenosis in the setting of the mitral valve vegetation as  detailed above. Recommend TEE for further evaluation.  2. Left ventricular ejection fraction, by estimation, is 60 to 65%. The left ventricle has normal function. The left ventricle has no regional wall motion abnormalities. There is moderate asymmetric left ventricular hypertrophy of the basal-septal segment. There is mild LVH of the rest of the LV segments. Left ventricular diastolic parameters are consistent with Grade I diastolic dysfunction (impaired relaxation).  3. Right ventricular systolic function is normal. The right ventricular size is normal. There is severely elevated pulmonary artery systolic pressure. The estimated right ventricular systolic pressure is 732.9mmHg.  4. The aortic valve is tricuspid. There is mild calcification of the aortic valve. There is moderate thickening of the aortic valve. Aortic valve regurgitation is mild. Mild to moderate aortic valve sclerosis/calcification is present, without any evidence of aortic stenosis. No distinct vegetations visualized, but the valve is thickened.  5. The inferior vena cava is normal in size with greater than 50% respiratory variability, suggesting right atrial pressure of 3 mmHg. Comparison(s): No prior Echocardiogram. Conclusion(s)/Recommendation(s): Findings concerning for mitral valve vegetation, would recommend a Transesophageal Echocardiogram for clarification. FINDINGS  Left Ventricle: Left ventricular ejection fraction, by estimation, is 60 to 65%. The left ventricle has normal function. The left ventricle has no regional wall motion abnormalities. 3D left ventricular ejection fraction analysis performed but not reported based on interpreter judgement due to suboptimal quality. The left ventricular internal cavity size was normal in size. There is moderate asymmetric left ventricular hypertrophy of the basal-septal segment. Left ventricular diastolic parameters are consistent with Grade I diastolic dysfunction (impaired relaxation). There is  flow acceleration in the LVOT, however, no systolic anterior motion visualized. Right Ventricle: The right ventricular size is normal. No increase in right ventricular wall thickness. Right ventricular systolic function is normal. There is severely elevated pulmonary artery systolic pressure. The tricuspid regurgitant velocity is 3.74 m/s, and with an assumed right atrial pressure of 15 mmHg, the estimated right ventricular systolic pressure is 792.4mmHg. Left Atrium: Left atrial size was normal in size. Right Atrium: Right atrial size was normal in size. Pericardium: There is no evidence of pericardial effusion. Mitral Valve: There is a mass present on the anterior mitral valve leaflet concerning for a vegetation that measures 1.4x1.0cm. Suspect moderate mitral regurgitation with two separate jets that  are posteriorly and anteriorly directed raising concern for possible leaflet perforation.The mean gradient across the MV is 58mHg with MVA by continuity of 1.35cm2 suggesting at least mild mitral stenosis in the setting of the mitral valve vegetation as detailed above. Recommend TEE for further evaluation. The mitral valve is abnormal. There is moderate thickening of the mitral valve leaflet(s). There is severe calcification of the mitral valve leaflet(s). Mild mitral annular calcification. Moderate mitral valve regurgitation. Mild to moderate mitral valve stenosis. MV peak gradient, 22.1 mmHg. The mean mitral valve gradient is 8.0 mmHg. Tricuspid Valve: The tricuspid valve is normal in structure. Tricuspid valve regurgitation is trivial. Aortic Valve: The aortic valve is tricuspid. There is mild calcification of the aortic valve. There is moderate thickening of the aortic valve. Aortic valve regurgitation is mild. Mild to moderate aortic valve sclerosis/calcification is present, without any evidence of aortic stenosis. Pulmonic Valve: The pulmonic valve was normal in structure. Pulmonic valve regurgitation is  trivial. Aorta: The aortic root and ascending aorta are structurally normal, with no evidence of dilitation. Venous: The inferior vena cava is normal in size with greater than 50% respiratory variability, suggesting right atrial pressure of 3 mmHg. IAS/Shunts: No atrial level shunt detected by color flow Doppler.  LEFT VENTRICLE PLAX 2D LVIDd:         4.10 cm      Diastology LVIDs:         2.70 cm      LV e' medial:    9.79 cm/s LV PW:         1.40 cm      LV E/e' medial:  14.5 LV IVS:        1.70 cm      LV e' lateral:   8.92 cm/s LVOT diam:     2.10 cm      LV E/e' lateral: 15.9 LV SV:         65 LV SV Index:   31 LVOT Area:     3.46 cm  LV Volumes (MOD) LV vol d, MOD A2C: 77.3 ml LV vol d, MOD A4C: 104.0 ml LV vol s, MOD A2C: 18.3 ml LV vol s, MOD A4C: 38.5 ml LV SV MOD A2C:     59.0 ml LV SV MOD A4C:     104.0 ml LV SV MOD BP:      72.3 ml RIGHT VENTRICLE             IVC RV S prime:     10.00 cm/s  IVC diam: 2.00 cm TAPSE (M-mode): 1.3 cm LEFT ATRIUM           Index       RIGHT ATRIUM           Index LA diam:      6.00 cm 2.86 cm/m  RA Area:     14.00 cm LA Vol (A2C): 72.1 ml 34.40 ml/m RA Volume:   26.60 ml  12.69 ml/m LA Vol (A4C): 51.1 ml 24.38 ml/m  AORTIC VALVE             PULMONIC VALVE LVOT Vmax:   109.00 cm/s PR End Diast Vel: 1.86 msec LVOT Vmean:  74.500 cm/s LVOT VTI:    0.188 m  AORTA Ao Root diam: 3.70 cm Ao Asc diam:  3.40 cm MITRAL VALVE                 TRICUSPID VALVE MV Area (PHT): 3.21 cm  TR Peak grad:   56.0 mmHg MV Area VTI:   1.48 cm      TR Vmax:        374.00 cm/s MV Peak grad:  22.1 mmHg MV Mean grad:  8.0 mmHg      SHUNTS MV Vmax:       2.35 m/s      Systemic VTI:  0.19 m MV Vmean:      133.0 cm/s    Systemic Diam: 2.10 cm MV Decel Time: 236 msec MR Peak grad:    70.6 mmHg MR Mean grad:    53.0 mmHg MR Vmax:         420.00 cm/s MR Vmean:        351.0 cm/s MR PISA:         4.02 cm MR PISA Eff ROA: 36 mm MR PISA Radius:  0.80 cm MV E velocity: 141.50 cm/s MV A velocity:  157.00 cm/s MV E/A ratio:  0.90 Gwyndolyn Kaufman MD Electronically signed by Gwyndolyn Kaufman MD Signature Date/Time: 03/04/2021/12:56:47 PM    Final (Updated)         Scheduled Meds: . vitamin C  500 mg Oral Daily  . Chlorhexidine Gluconate Cloth  6 each Topical Q0600  . cholecalciferol  1,000 Units Oral Daily  . dexamethasone  6 mg Oral Daily  . enoxaparin (LOVENOX) injection  40 mg Subcutaneous Q24H  . feeding supplement  237 mL Oral BID BM  . furosemide      . [START ON 03/07/2021] furosemide  20 mg Oral Daily  . levothyroxine  175 mcg Oral QAC breakfast  . mouth rinse  15 mL Mouth Rinse BID  . multivitamin with minerals  1 tablet Oral Daily  . mupirocin ointment  1 application Nasal BID  . potassium chloride  30 mEq Oral Q6H  . traZODone  50 mg Oral QHS  . zinc sulfate  220 mg Oral Daily   Continuous Infusions: . sodium chloride 10 mL/hr at 03/06/21 0000  . sodium chloride Stopped (03/05/21 0323)  . ampicillin (OMNIPEN) IV 2 g (03/06/21 0821)  . cefTRIAXone (ROCEPHIN)  IV 2 g (03/05/21 2117)  . remdesivir 100 mg in NS 100 mL 100 mg (03/05/21 1008)     LOS: 3 days    Phillips Climes, MD [facility]  If 7PM-7AM, please contact night-coverage [email or web address]  03/06/2021, 9:22 AM

## 2021-03-06 NOTE — Care Management Important Message (Signed)
Important Message  Patient Details  Name: Brandon [name] MRN: [id number] Date of Birth: 1951/07/24   Medicare Important Message Given:  Yes     Leone Haven, RN 03/06/2021, 2:07 PM

## 2021-03-06 NOTE — Progress Notes (Signed)
Subjective:  Less short of breath but having difficulty getting full breaths now with a tender area on one of his fingers which he says been there for 3 days  Antibiotics:  Anti-infectives (From admission, onward)   Start     Dose/Rate Route Frequency Ordered Stop   03/05/21 1000  remdesivir 100 mg in sodium chloride 0.9 % 100 mL IVPB       "Followed by" Linked Group Details   100 mg 200 mL/hr over 30 Minutes Intravenous Daily 03/04/21 0035 03/09/21 0959   03/04/21 1000  remdesivir 100 mg in sodium chloride 0.9 % 100 mL IVPB  Status:  Discontinued       "Followed by" Linked Group Details   100 mg 200 mL/hr over 30 Minutes Intravenous Daily 03/03/21 1806 03/04/21 0035   03/04/21 1000  remdesivir 200 mg in sodium chloride 0.9% 250 mL IVPB       "Followed by" Linked Group Details   200 mg 580 mL/hr over 30 Minutes Intravenous Once 03/04/21 0035 03/04/21 1230   03/03/21 2000  cefTRIAXone (ROCEPHIN) 2 g in sodium chloride 0.9 % 100 mL IVPB        2 g 200 mL/hr over 30 Minutes Intravenous Every 12 hours 03/03/21 1708     03/03/21 2000  remdesivir 200 mg in sodium chloride 0.9% 250 mL IVPB  Status:  Discontinued       "Followed by" Linked Group Details   200 mg 580 mL/hr over 30 Minutes Intravenous Once 03/03/21 1806 03/04/21 0035   03/03/21 1600  ampicillin (OMNIPEN) 2 g in sodium chloride 0.9 % 100 mL IVPB        2 g 300 mL/hr over 20 Minutes Intravenous Every 4 hours 03/03/21 1525        Medications: Scheduled Meds: . vitamin C  500 mg Oral Daily  . Chlorhexidine Gluconate Cloth  6 each Topical Q0600  . cholecalciferol  1,000 Units Oral Daily  . enoxaparin (LOVENOX) injection  40 mg Subcutaneous Q24H  . feeding supplement  237 mL Oral BID BM  . furosemide      . [START ON 03/07/2021] furosemide  40 mg Oral Daily  . levothyroxine  175 mcg Oral QAC breakfast  . mouth rinse  15 mL Mouth Rinse BID  . methylPREDNISolone (SOLU-MEDROL) injection  60 mg Intravenous Q12H   . multivitamin with minerals  1 tablet Oral Daily  . mupirocin ointment  1 application Nasal BID  . pantoprazole  40 mg Oral Daily  . potassium chloride  30 mEq Oral Q6H  . traZODone  50 mg Oral QHS  . zinc sulfate  220 mg Oral Daily   Continuous Infusions: . sodium chloride 10 mL/hr at 03/06/21 0000  . sodium chloride Stopped (03/05/21 0323)  . ampicillin (OMNIPEN) IV 2 g (03/06/21 0821)  . cefTRIAXone (ROCEPHIN)  IV 2 g (03/06/21 1009)  . remdesivir 100 mg in NS 100 mL 100 mg (03/05/21 1008)   PRN Meds:.sodium chloride, sodium chloride, acetaminophen **OR** acetaminophen, albuterol, chlorpheniramine-HYDROcodone, fluticasone, guaiFENesin-dextromethorphan, hydrALAZINE, ipratropium-albuterol    Objective: Weight change:   Intake/Output Summary (Last 24 hours) at 03/06/2021 1103 Last data filed at 03/06/2021 1000 Gross per 24 hour  Intake 2611.56 ml  Output 1550 ml  Net 1061.56 ml   Blood pressure 133/72, pulse (!) 109, temperature 98.7 F (37.1 C), temperature source Oral, resp. rate (!) 28, height 6\' 1"  (1.854 m), weight 85.3 kg, SpO2 97 %. Temp:  [  97.8 F (36.6 C)-98.7 F (37.1 C)] 98.7 F (37.1 C) (04/12 0400) Pulse Rate:  [104-118] 109 (04/12 0400) Resp:  [21-28] 28 (04/12 0400) BP: (133-145)/(72-87) 133/72 (04/12 0400) SpO2:  [96 %-97 %] 97 % (04/12 0400)  Physical Exam: Physical Exam Constitutional:      Appearance: He is well-developed.  HENT:     Head: Normocephalic and atraumatic.  Eyes:     Conjunctiva/sclera: Conjunctivae normal.  Cardiovascular:     Rate and Rhythm: Normal rate and regular rhythm.     Heart sounds: Murmur heard.    Pulmonary:     Effort: Pulmonary effort is normal. No respiratory distress.     Breath sounds: No stridor. No wheezing or rhonchi.  Abdominal:     General: There is no distension.     Palpations: Abdomen is soft. There is no mass.  Musculoskeletal:        General: Normal range of motion.     Cervical back: Normal range  of motion and neck supple.  Skin:    General: Skin is warm and dry.     Findings: No erythema or rash.  Neurological:     General: No focal deficit present.     Mental Status: He is alert and oriented to person, place, and time.  Psychiatric:        Mood and Affect: Mood normal.        Behavior: Behavior normal.        Thought Content: Thought content normal.        Judgment: Judgment normal.      Left hand see pictures for 11/13/2021:        CBC:    BMET Recent Labs    03/05/21 0042 03/06/21 0057  NA 131* 132*  K 3.5 3.4*  CL 100 99  CO2 24 21*  GLUCOSE 98 116*  BUN 17 15  CREATININE 1.18 0.89  CALCIUM 8.0* 8.1*     Liver Panel  Recent Labs    03/05/21 0042 03/06/21 0057  PROT 6.3* 5.9*  ALBUMIN 2.4* 2.1*  AST 33 25  ALT 63* 47*  ALKPHOS 181* 159*  BILITOT 1.0 1.3*       Sedimentation Rate No results for input(s): ESRSEDRATE in the last 72 hours. C-Reactive Protein Recent Labs    03/05/21 0042 03/06/21 0057  CRP 20.8* 22.5*    Micro Results: Recent Results (from the past 720 hour(s))  Culture, blood (routine x 2)     Status: Abnormal   Collection Time: 03/02/21 11:50 AM   Specimen: BLOOD  Result Value Ref Range Status   Specimen Description   Final    BLOOD LEFT ANTECUBITAL Performed at [facility] [place], [address] [place], [place] [address]    Special Requests   Final    BOTTLES DRAWN AEROBIC AND ANAEROBIC Blood Culture adequate volume Performed at [facility] [place], [address] [place], [place] [address]    Culture  Setup Time   Final    GRAM POSITIVE COCCI IN BOTH AEROBIC AND ANAEROBIC BOTTLES CRITICAL VALUE NOTED.  VALUE IS CONSISTENT WITH PREVIOUSLY REPORTED AND CALLED VALUE.    Culture (A)  Final    ENTEROCOCCUS FAECALIS SUSCEPTIBILITIES PERFORMED ON PREVIOUS CULTURE WITHIN THE LAST 5 DAYS. Performed at [facility], [address] [place], [place] [address]    Report Status 03/05/2021  FINAL  Final  Culture, blood (routine x 2)     Status: Abnormal  Collection Time: 03/02/21  1:10 PM   Specimen: Right Antecubital; Blood  Result Value Ref Range Status   Specimen Description   Final    RIGHT ANTECUBITAL BLOOD Performed at [facility], [address] [place], [place] [address]    Special Requests   Final    BOTTLES DRAWN AEROBIC AND ANAEROBIC Blood Culture adequate volume Performed at [facility], [address] [place], [place] [address]    Culture  Setup Time   Final    GRAM POSITIVE COCCI IN CHAINS IN BOTH AEROBIC AND ANAEROBIC BOTTLES CRITICAL RESULT CALLED TO, READ BACK BY AND VERIFIED WITH: S GOUGE RN  03/03/21 EB Performed at [facility], [address] [place], [place] [address]    Culture ENTEROCOCCUS FAECALIS (A)  Final   Report Status 03/05/2021 FINAL  Final   Organism ID, Bacteria ENTEROCOCCUS FAECALIS  Final      Susceptibility   Enterococcus faecalis - MIC*    AMPICILLIN <=2 SENSITIVE Sensitive     VANCOMYCIN 1 SENSITIVE Sensitive     GENTAMICIN SYNERGY SENSITIVE Sensitive     * ENTEROCOCCUS FAECALIS  Blood Culture ID Panel (Reflexed)     Status: Abnormal   Collection Time: 03/02/21  1:10 PM  Result Value Ref Range Status   Enterococcus faecalis DETECTED (A) NOT DETECTED Final    Comment: CRITICAL RESULT CALLED TO, READ BACK BY AND VERIFIED WITH: S GOUGE RN  03/03/21 EB    Enterococcus Faecium NOT DETECTED NOT DETECTED Final   Listeria monocytogenes NOT DETECTED NOT DETECTED Final   Staphylococcus species NOT DETECTED NOT DETECTED Final   Staphylococcus aureus (BCID) NOT DETECTED NOT DETECTED Final   Staphylococcus epidermidis NOT DETECTED NOT DETECTED Final   Staphylococcus lugdunensis NOT DETECTED NOT DETECTED Final   Streptococcus species NOT DETECTED NOT DETECTED Final   Streptococcus agalactiae NOT DETECTED NOT DETECTED Final   Streptococcus pneumoniae NOT DETECTED NOT DETECTED Final    Streptococcus pyogenes NOT DETECTED NOT DETECTED Final   A.calcoaceticus-baumannii NOT DETECTED NOT DETECTED Final   Bacteroides fragilis NOT DETECTED NOT DETECTED Final   Enterobacterales NOT DETECTED NOT DETECTED Final   Enterobacter cloacae complex NOT DETECTED NOT DETECTED Final   Escherichia coli NOT DETECTED NOT DETECTED Final   Klebsiella aerogenes NOT DETECTED NOT DETECTED Final   Klebsiella oxytoca NOT DETECTED NOT DETECTED Final   Klebsiella pneumoniae NOT DETECTED NOT DETECTED Final   Proteus species NOT DETECTED NOT DETECTED Final   Salmonella species NOT DETECTED NOT DETECTED Final   Serratia marcescens NOT DETECTED NOT DETECTED Final   Haemophilus influenzae NOT DETECTED NOT DETECTED Final   Neisseria meningitidis NOT DETECTED NOT DETECTED Final   Pseudomonas aeruginosa NOT DETECTED NOT DETECTED Final   Stenotrophomonas maltophilia NOT DETECTED NOT DETECTED Final   Candida albicans NOT DETECTED NOT DETECTED Final   Candida auris NOT DETECTED NOT DETECTED Final   Candida glabrata NOT DETECTED NOT DETECTED Final   Candida krusei NOT DETECTED NOT DETECTED Final   Candida parapsilosis NOT DETECTED NOT DETECTED Final   Candida tropicalis NOT DETECTED NOT DETECTED Final   Cryptococcus neoformans/gattii NOT DETECTED NOT DETECTED Final   Vancomycin resistance NOT DETECTED NOT DETECTED Final    Comment: Performed at [facility], [address], [place], [place] [address]  Resp Panel by RT-PCR (Flu A&B, Covid) Nasopharyngeal Swab     Status: None   Collection Time: 03/02/21  3:41 PM   Specimen: Nasopharyngeal Swab;  Nasopharyngeal(NP) swabs in vial transport medium  Result Value Ref Range Status   SARS Coronavirus 2 by RT PCR NEGATIVE NEGATIVE Final    Comment: (NOTE) SARS-CoV-2 target nucleic acids are NOT DETECTED.  The SARS-CoV-2 RNA is generally detectable in upper respiratory specimens during the acute phase of infection. The lowest concentration of SARS-CoV-2 viral  copies this assay can detect is 138 copies/mL. A negative result does not preclude SARS-Cov-2 infection and should not be used as the sole basis for treatment or other patient management decisions. A negative result may occur with  improper specimen collection/handling, submission of specimen other than nasopharyngeal swab, presence of viral mutation(s) within the areas targeted by this assay, and inadequate number of viral copies(<138 copies/mL). A negative result must be combined with clinical observations, patient history, and epidemiological information. The expected result is Negative.  Fact Sheet for Patients:  BloggerCourse.com  Fact Sheet for Healthcare Providers:  SeriousBroker.it  This test is no t yet approved or cleared by the [place] FDA and  has been authorized for detection and/or diagnosis of SARS-CoV-2 by FDA under an Emergency Use Authorization (EUA). This EUA will remain  in effect (meaning this test can be used) for the duration of the COVID-19 declaration under Section 564(b)(1) of the Act, 21 U.S.C.section 360bbb-3(b)(1), unless the authorization is terminated  or revoked sooner.       Influenza A by PCR NEGATIVE NEGATIVE Final   Influenza B by PCR NEGATIVE NEGATIVE Final    Comment: (NOTE) The Xpert Xpress SARS-CoV-2/FLU/RSV plus assay is intended as an aid in the diagnosis of influenza from Nasopharyngeal swab specimens and should not be used as a sole basis for treatment. Nasal washings and aspirates are unacceptable for Xpert Xpress SARS-CoV-2/FLU/RSV testing.  Fact Sheet for Patients: BloggerCourse.com  Fact Sheet for Healthcare Providers: SeriousBroker.it  This test is not yet approved or cleared by the [place] FDA and has been authorized for detection and/or diagnosis of SARS-CoV-2 by FDA under an Emergency Use Authorization (EUA). This  EUA will remain in effect (meaning this test can be used) for the duration of the COVID-19 declaration under Section 564(b)(1) of the Act, 21 U.S.C. section 360bbb-3(b)(1), unless the authorization is terminated or revoked.  Performed at [facility], [address] [place], [place] [address]   Blood culture (routine x 2)     Status: None (Preliminary result)   Collection Time: 03/03/21  1:58 PM   Specimen: BLOOD  Result Value Ref Range Status   Specimen Description   Final    BLOOD LEFT ANTECUBITAL Performed at [facility], [address], [place], [place] [address]    Special Requests   Final    BOTTLES DRAWN AEROBIC AND ANAEROBIC Blood Culture adequate volume Performed at [facility], [address], [place], [place] [address]    Culture   Final    NO GROWTH 3 DAYS Performed at [facility], [address] [place], [place] [address]    Report Status PENDING  Incomplete  Resp Panel by RT-PCR (Flu A&B, Covid) Nasopharyngeal Swab     Status: Abnormal   Collection Time: 03/03/21  3:08 PM   Specimen: Nasopharyngeal Swab; Nasopharyngeal(NP) swabs in vial transport medium  Result Value Ref Range Status   SARS Coronavirus 2 by RT PCR POSITIVE (A) NEGATIVE Final    Comment: RESULT CALLED TO, READ BACK BY AND VERIFIED WITH: BOWEN,M. RN AT 1628 03/03/21 MULLINS,T (NOTE) SARS-CoV-2 target  nucleic acids are DETECTED.  The SARS-CoV-2 RNA is generally detectable in upper respiratory specimens during the acute phase of infection. Positive results are indicative of the presence of the identified virus, but do not rule out bacterial infection or co-infection with other pathogens not detected by the test. Clinical correlation with patient history and other diagnostic information is necessary to determine patient infection status. The expected result is Negative.  Fact Sheet for  Patients: BloggerCourse.com  Fact Sheet for Healthcare Providers: SeriousBroker.it  This test is not yet approved or cleared by the [place] FDA and  has been authorized for detection and/or diagnosis of SARS-CoV-2 by FDA under an Emergency Use Authorization (EUA).  This EUA will remain in effect (meaning this test c an be used) for the duration of  the COVID-19 declaration under Section 564(b)(1) of the Act, 21 U.S.C. section 360bbb-3(b)(1), unless the authorization is terminated or revoked sooner.     Influenza A by PCR NEGATIVE NEGATIVE Final   Influenza B by PCR NEGATIVE NEGATIVE Final    Comment: (NOTE) The Xpert Xpress SARS-CoV-2/FLU/RSV plus assay is intended as an aid in the diagnosis of influenza from Nasopharyngeal swab specimens and should not be used as a sole basis for treatment. Nasal washings and aspirates are unacceptable for Xpert Xpress SARS-CoV-2/FLU/RSV testing.  Fact Sheet for Patients: BloggerCourse.com  Fact Sheet for Healthcare Providers: SeriousBroker.it  This test is not yet approved or cleared by the [place] FDA and has been authorized for detection and/or diagnosis of SARS-CoV-2 by FDA under an Emergency Use Authorization (EUA). This EUA will remain in effect (meaning this test can be used) for the duration of the COVID-19 declaration under Section 564(b)(1) of the Act, 21 U.S.C. section 360bbb-3(b)(1), unless the authorization is terminated or revoked.  Performed at [facility], [address], [place], [place] [address]   Urine culture     Status: None   Collection Time: 03/03/21  3:08 PM   Specimen: In/Out Cath Urine  Result Value Ref Range Status   Specimen Description   Final    IN/OUT CATH URINE Performed at [facility], [address], [place], [place] [address]    Special Requests    Final    NONE Performed at [facility], [address] [place], [place] [address]    Culture   Final    NO GROWTH Performed at [facility], [address] [place], [place] [address]    Report Status 03/04/2021 FINAL  Final  Blood culture (routine x 2)     Status: None (Preliminary result)   Collection Time: 03/03/21  3:41 PM   Specimen: BLOOD  Result Value Ref Range Status   Specimen Description   Final    BLOOD RIGHT ANTECUBITAL Performed at [facility], [address], [place], [place] [address]    Special Requests   Final    BOTTLES DRAWN AEROBIC AND ANAEROBIC Blood Culture adequate volume Performed at [facility], [address] [place], [place] [address]    Culture   Final    NO GROWTH 3 DAYS Performed at [facility], [address], [place], [place] [address]    Report Status PENDING  Incomplete  MRSA PCR Screening     Status: Abnormal   Collection Time: 03/04/21 11:53 PM   Specimen: Nasopharyngeal  Result Value Ref Range Status   MRSA by PCR POSITIVE (A) NEGATIVE Final  Comment:        The GeneXpert MRSA Assay (FDA approved for NASAL specimens only), is one component of a comprehensive MRSA colonization surveillance program. It is not intended to diagnose MRSA infection nor to guide or monitor treatment for MRSA infections. RESULT CALLED TO, READ BACK BY AND VERIFIED WITH: [facility] RN AT 0121 03/05/2021 MITCHELL,L Performed at [facility], [address] [place], [place] [address]     Studies/Results: DG Chest Port 1 View  Result Date: 03/06/2021 CLINICAL DATA:  70 year old male positive COVID-19 on 03/03/2021. Shortness of breath. EXAM: PORTABLE CHEST 1 VIEW COMPARISON:  Portable chest 03/03/2021. FINDINGS: Portable AP semi upright view at 0830 hours. Progressed and extensive airspace opacity now in the right upper lobe, about both hila. Left lung apex and the lateral  costophrenic angles remain relatively spared. Stable lung volumes and mediastinal contours. No pneumothorax. Visualized tracheal air column is within normal limits. Prior thyroidectomy and left clavicle ORIF. IMPRESSION: Progressed bilateral pneumonia, now moderate to severe. No pneumothorax or pleural effusion. Electronically Signed   By: Odessa Fleming M.D.   On: 03/06/2021 08:34   ECHOCARDIOGRAM COMPLETE  Result Date: 03/04/2021    ECHOCARDIOGRAM REPORT   Patient Name:   PLES TRUDEL Date of Exam: 03/04/2021 Medical Rec #:  [id number]       Height:       73.0 in Accession #:    [id number]      Weight:       188.1 lb Date of Birth:  03/19/1951       BSA:          2.096 m Patient Age:    69 years        BP:           137/77 mmHg Patient Gender: M               HR:           114 bpm. Exam Location:  Inpatient Procedure: 2D Echo, 3D Echo, Cardiac Doppler and Color Doppler Indications:     Bacteremia.  History:         Patient has no prior history of Echocardiogram examinations.                  Abnormal ECG, Arrythmias:Atrial Fibrillation,                  Signs/Symptoms:Bacteremia; Risk Factors:Hypertension and                  Dyslipidemia. Covid infection. Left pneumothorax.  Sonographer:     Sheralyn Boatman RDCS Referring Phys:  [id number]her[address] Marten CHIU Diagnosing Phys: Laurance Flatten MD  Sonographer Comments: Technically difficult study. IMPRESSIONS  1. There is a mass present on the anterior mitral valve leaflet concerning for a vegetation that measures 1.4x1.0cm. Suspect moderate mitral regurgitation with two separate jets that are posteriorly and anteriorly directed raising concern for possible leaflet perforation.The mean gradient across the MV is (HR 100) with MVA by continuity of 1.35cm2 suggesting at least mild mitral stenosis in the setting of the mitral valve vegetation as detailed above. Recommend TEE for further evaluation.  2. Left ventricular ejection fraction, by estimation, is 60 to 65%. The left  ventricle has normal function. The left ventricle has no regional wall motion abnormalities. There is moderate asymmetric left ventricular hypertrophy of the basal-septal segment. There is mild LVH of the rest of the LV segments. Left ventricular diastolic parameters  are consistent with Grade I diastolic dysfunction (impaired relaxation).  3. Right ventricular systolic function is normal. The right ventricular size is normal. There is severely elevated pulmonary artery systolic pressure. The estimated right ventricular systolic pressure is 71.0 mmHg.  4. The aortic valve is tricuspid. There is mild calcification of the aortic valve. There is moderate thickening of the aortic valve. Aortic valve regurgitation is mild. Mild to moderate aortic valve sclerosis/calcification is present, without any evidence of aortic stenosis. No distinct vegetations visualized, but the valve is thickened.  5. The inferior vena cava is normal in size with greater than 50% respiratory variability, suggesting right atrial pressure of 3 mmHg. Comparison(s): No prior Echocardiogram. Conclusion(s)/Recommendation(s): Findings concerning for mitral valve vegetation, would recommend a Transesophageal Echocardiogram for clarification. FINDINGS  Left Ventricle: Left ventricular ejection fraction, by estimation, is 60 to 65%. The left ventricle has normal function. The left ventricle has no regional wall motion abnormalities. 3D left ventricular ejection fraction analysis performed but not reported based on interpreter judgement due to suboptimal quality. The left ventricular internal cavity size was normal in size. There is moderate asymmetric left ventricular hypertrophy of the basal-septal segment. Left ventricular diastolic parameters are consistent with Grade I diastolic dysfunction (impaired relaxation). There is flow acceleration in the LVOT, however, no systolic anterior motion visualized. Right Ventricle: The right ventricular size is  normal. No increase in right ventricular wall thickness. Right ventricular systolic function is normal. There is severely elevated pulmonary artery systolic pressure. The tricuspid regurgitant velocity is 3.74 m/s, and with an assumed right atrial pressure of 15 mmHg, the estimated right ventricular systolic pressure is 71.0 mmHg. Left Atrium: Left atrial size was normal in size. Right Atrium: Right atrial size was normal in size. Pericardium: There is no evidence of pericardial effusion. Mitral Valve: There is a mass present on the anterior mitral valve leaflet concerning for a vegetation that measures 1.4x1.0cm. Suspect moderate mitral regurgitation with two separate jets that are posteriorly and anteriorly directed raising concern for possible leaflet perforation.The mean gradient across the MV is with MVA by continuity of 1.35cm2 suggesting at least mild mitral stenosis in the setting of the mitral valve vegetation as detailed above. Recommend TEE for further evaluation. The mitral valve is abnormal. There is moderate thickening of the mitral valve leaflet(s). There is severe calcification of the mitral valve leaflet(s). Mild mitral annular calcification. Moderate mitral valve regurgitation. Mild to moderate mitral valve stenosis. MV peak gradient, 22.1 mmHg. The mean mitral valve gradient is 8.0 mmHg. Tricuspid Valve: The tricuspid valve is normal in structure. Tricuspid valve regurgitation is trivial. Aortic Valve: The aortic valve is tricuspid. There is mild calcification of the aortic valve. There is moderate thickening of the aortic valve. Aortic valve regurgitation is mild. Mild to moderate aortic valve sclerosis/calcification is present, without any evidence of aortic stenosis. Pulmonic Valve: The pulmonic valve was normal in structure. Pulmonic valve regurgitation is trivial. Aorta: The aortic root and ascending aorta are structurally normal, with no evidence of dilitation. Venous: The inferior  vena cava is normal in size with greater than 50% respiratory variability, suggesting right atrial pressure of 3 mmHg. IAS/Shunts: No atrial level shunt detected by color flow Doppler.  LEFT VENTRICLE PLAX 2D LVIDd:         4.10 cm      Diastology LVIDs:         2.70 cm      LV e' medial:    9.79 cm/s LV PW:  1.40 cm      LV E/e' medial:  14.5 LV IVS:        1.70 cm      LV e' lateral:   8.92 cm/s LVOT diam:     2.10 cm      LV E/e' lateral: 15.9 LV SV:         65 LV SV Index:   31 LVOT Area:     3.46 cm  LV Volumes (MOD) LV vol d, MOD A2C: 77.3 ml LV vol d, MOD A4C: 104.0 ml LV vol s, MOD A2C: 18.3 ml LV vol s, MOD A4C: 38.5 ml LV SV MOD A2C:     59.0 ml LV SV MOD A4C:     104.0 ml LV SV MOD BP:      72.3 ml RIGHT VENTRICLE             IVC RV S prime:     10.00 cm/s  IVC diam: 2.00 cm TAPSE (M-mode): 1.3 cm LEFT ATRIUM           Index       RIGHT ATRIUM           Index LA diam:      6.00 cm 2.86 cm/m  RA Area:     14.00 cm LA Vol (A2C): 72.1 ml 34.40 ml/m RA Volume:   26.60 ml  12.69 ml/m LA Vol (A4C): 51.1 ml 24.38 ml/m  AORTIC VALVE             PULMONIC VALVE LVOT Vmax:   109.00 cm/s PR End Diast Vel: 1.86 msec LVOT Vmean:  74.500 cm/s LVOT VTI:    0.188 m  AORTA Ao Root diam: 3.70 cm Ao Asc diam:  3.40 cm MITRAL VALVE                 TRICUSPID VALVE MV Area (PHT): 3.21 cm      TR Peak grad:   56.0 mmHg MV Area VTI:   1.48 cm      TR Vmax:        374.00 cm/s MV Peak grad:  22.1 mmHg MV Mean grad:  8.0 mmHg      SHUNTS MV Vmax:       2.35 m/s      Systemic VTI:  0.19 m MV Vmean:      133.0 cm/s    Systemic Diam: 2.10 cm MV Decel Time: 236 msec MR Peak grad:    70.6 mmHg MR Mean grad:    53.0 mmHg MR Vmax:         420.00 cm/s MR Vmean:        351.0 cm/s MR PISA:         4.02 cm MR PISA Eff ROA: 36 mm MR PISA Radius:  0.80 cm MV E velocity: 141.50 cm/s MV A velocity: 157.00 cm/s MV E/A ratio:  0.90 Laurance Flatten MD Electronically signed by Laurance Flatten MD Signature Date/Time:  03/04/2021/12:56:47 PM    Final (Updated)       Assessment/Plan:  INTERVAL HISTORY:O2 requirements are higher than yesterday   Active Problems:   Sepsis (HCC)   Bacteremia   Endocarditis of mitral valve   Pneumonia due to COVID-19 virus    Brandon [name] is a 70 y.o. male with prior history of COVID-19 infection smoldering symptoms with hindsight likely due to subacute bacterial endocarditis which she has now been proven to have on 2D echocardiogram with Enterococcus faecalis growing in  blood.  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  Need to monitor for further stigmata in particular evidence of septic embolization on antibiotic therapy as this would be a reason for more urgent cardiothoracic surgery.  Patient has been seen by Dr. Dorris Fetch with cardiothoracic surgery.  He does not see an urgent indication for surgery at this point in time.  Patient will need a transesophageal echocardiogram  Fact the patient has COVID-19 may mean a significant delay in that being done it is my understanding    Continue ceftriaxone and ampicillin   #2 COVID 19: Second infection with this virus.  He is on remdesivir and steroids.    LOS: 3 days   Acey Lav 03/06/2021, 11:03 AM

## 2021-03-07 DIAGNOSIS — I059 Rheumatic mitral valve disease, unspecified: Secondary | ICD-10-CM | POA: Diagnosis not present

## 2021-03-07 DIAGNOSIS — U071 COVID-19: Secondary | ICD-10-CM | POA: Diagnosis present

## 2021-03-07 DIAGNOSIS — J189 Pneumonia, unspecified organism: Secondary | ICD-10-CM | POA: Diagnosis present

## 2021-03-07 DIAGNOSIS — A419 Sepsis, unspecified organism: Secondary | ICD-10-CM | POA: Diagnosis not present

## 2021-03-07 DIAGNOSIS — I340 Nonrheumatic mitral (valve) insufficiency: Secondary | ICD-10-CM | POA: Diagnosis not present

## 2021-03-07 DIAGNOSIS — I480 Paroxysmal atrial fibrillation: Secondary | ICD-10-CM | POA: Diagnosis not present

## 2021-03-07 DIAGNOSIS — J9601 Acute respiratory failure with hypoxia: Secondary | ICD-10-CM | POA: Diagnosis present

## 2021-03-07 DIAGNOSIS — Z8616 Personal history of COVID-19: Secondary | ICD-10-CM | POA: Diagnosis not present

## 2021-03-07 DIAGNOSIS — R748 Abnormal levels of other serum enzymes: Secondary | ICD-10-CM | POA: Diagnosis present

## 2021-03-07 DIAGNOSIS — R7881 Bacteremia: Secondary | ICD-10-CM | POA: Diagnosis not present

## 2021-03-07 LAB — COMPREHENSIVE METABOLIC PANEL
ALT: 41 U/L (ref 0–44)
AST: 23 U/L (ref 15–41)
Albumin: 2.1 g/dL — ABNORMAL LOW (ref 3.5–5.0)
Alkaline Phosphatase: 138 U/L — ABNORMAL HIGH (ref 38–126)
Anion gap: 8 (ref 5–15)
BUN: 19 mg/dL (ref 8–23)
CO2: 26 mmol/L (ref 22–32)
Calcium: 8.5 mg/dL — ABNORMAL LOW (ref 8.9–10.3)
Chloride: 102 mmol/L (ref 98–111)
Creatinine, Ser: 1.01 mg/dL (ref 0.61–1.24)
GFR, Estimated: >60 mL/min (ref >60)
Glucose, Bld: 149 mg/dL — ABNORMAL HIGH (ref 70–99)
Potassium: 4.3 mmol/L (ref 3.5–5.1)
Sodium: 136 mmol/L (ref 135–145)
Total Bilirubin: 0.9 mg/dL (ref 0.3–1.2)
Total Protein: 6.3 g/dL — ABNORMAL LOW (ref 6.5–8.1)

## 2021-03-07 LAB — CBC
HCT: 32.6 % — ABNORMAL LOW (ref 39.0–52.0)
Hemoglobin: 10.7 g/dL — ABNORMAL LOW (ref 13.0–17.0)
MCH: 31.3 pg (ref 26.0–34.0)
MCHC: 32.8 g/dL (ref 30.0–36.0)
MCV: 95.3 fL (ref 80.0–100.0)
Platelets: 359 10*3/uL (ref 150–400)
RBC: 3.42 MIL/uL — ABNORMAL LOW (ref 4.22–5.81)
RDW: 13.8 % (ref 11.5–15.5)
WBC: 13.4 10*3/uL — ABNORMAL HIGH (ref 4.0–10.5)
nRBC: 0 % (ref 0.0–0.2)

## 2021-03-07 LAB — PROCALCITONIN: Procalcitonin: 0.19 ng/mL

## 2021-03-07 LAB — BRAIN NATRIURETIC PEPTIDE: B Natriuretic Peptide: 162.8 pg/mL — ABNORMAL HIGH (ref 0.0–100.0)

## 2021-03-07 LAB — C-REACTIVE PROTEIN: CRP: 23.5 mg/dL — ABNORMAL HIGH (ref <1.0)

## 2021-03-07 MED ORDER — IPRATROPIUM-ALBUTEROL 20-100 MCG/ACT IN AERS
1.0000 | INHALATION_SPRAY | Freq: Four times a day (QID) | RESPIRATORY_TRACT | Status: DC
  Administered 2021-03-07 – 2021-03-13 (×22): 1 via RESPIRATORY_TRACT
  Filled 2021-03-07: qty 4

## 2021-03-07 NOTE — Progress Notes (Signed)
Procedure(s) (LRB): TRANSESOPHAGEAL ECHOCARDIOGRAM (TEE) (N/A) Subjective: Feels better today.  Still a little short of breath.  Objective: Vital signs in last 24 hours: Temp:  [97.6 F (36.4 C)-98.1 F (36.7 C)] 97.7 F (36.5 C) (04/13 1557) Pulse Rate:  [89-100] 89 (04/13 1557) Cardiac Rhythm: Normal sinus rhythm (04/13 1300) Resp:  [18-22] 19 (04/13 1557) BP: (117-129)/(67-82) 126/77 (04/13 1557) SpO2:  [94 %-96 %] 94 % (04/13 1557)  Hemodynamic parameters for last 24 hours:    Intake/Output from previous day: 04/12 0701 - 04/13 0700 In: 1946.3 [P.O.:1194; IV Piggyback:752.3] Out: 2600 [Urine:2600] Intake/Output this shift: Total I/O In: 200 [P.O.:200] Out: -   General appearance: alert, cooperative and no distress Neurologic: intact Heart: regular rate and rhythm and Holosystolic murmur loudest at apex Lungs: Coarse breath sounds bilaterally, no wheezing  Lab Results: Recent Labs    03/06/21 0057 03/07/21 0251  WBC 16.2* 13.4*  HGB 10.6* 10.7*  HCT 32.6* 32.6*  PLT 287 359   BMET:  Recent Labs    03/06/21 0057 03/07/21 0251  NA 132* 136  K 3.4* 4.3  CL 99 102  CO2 21* 26  GLUCOSE 116* 149*  BUN 15 19  CREATININE 0.89 1.01  CALCIUM 8.1* 8.5*    PT/INR: No results for input(s): LABPROT, INR in the last 72 hours. ABG    Component Value Date/Time   TCO2 27 03/30/2016 1130   CBG (last 3)  No results for input(s): GLUCAP in the last 72 hours.  Assessment/Plan: S/P Procedure(s) (LRB): TRANSESOPHAGEAL ECHOCARDIOGRAM (TEE) (N/A) -  Mitral valve endocarditis-afebrile in sinus rhythm with good blood pressure On ampicillin and ceftriaxone, white blood cell count continues to fluctuate, down today, CRP remains elevated, procalcitonin down slightly. Chest x-ray yesterday showed progression of pneumonia in the right upper lobe.  Also some opacity in the hila bilaterally question infection versus edema, although BNP only 162. For TEE tomorrow Question  of embolus to finger, symptoms improved.  Clinical situation remains difficult given right upper lobe process in the setting of positive COVID-19 test.  LOS: 4 days    Loreli Slot 03/07/2021

## 2021-03-07 NOTE — Progress Notes (Signed)
Subjective:  He has tenderness his fingers going down.  He feels less short of breath  Antibiotics:  Anti-infectives (From admission, onward)   Start     Dose/Rate Route Frequency Ordered Stop   03/05/21 1000  remdesivir 100 mg in sodium chloride 0.9 % 100 mL IVPB       "Followed by" Linked Group Details   100 mg 200 mL/hr over 30 Minutes Intravenous Daily 03/04/21 0035 03/09/21 0959   03/04/21 1000  remdesivir 100 mg in sodium chloride 0.9 % 100 mL IVPB  Status:  Discontinued       "Followed by" Linked Group Details   100 mg 200 mL/hr over 30 Minutes Intravenous Daily 03/03/21 1806 03/04/21 0035   03/04/21 1000  remdesivir 200 mg in sodium chloride 0.9% 250 mL IVPB       "Followed by" Linked Group Details   200 mg 580 mL/hr over 30 Minutes Intravenous Once 03/04/21 0035 03/04/21 1230   03/03/21 2000  cefTRIAXone (ROCEPHIN) 2 g in sodium chloride 0.9 % 100 mL IVPB        2 g 200 mL/hr over 30 Minutes Intravenous Every 12 hours 03/03/21 1708     03/03/21 2000  remdesivir 200 mg in sodium chloride 0.9% 250 mL IVPB  Status:  Discontinued       "Followed by" Linked Group Details   200 mg 580 mL/hr over 30 Minutes Intravenous Once 03/03/21 1806 03/04/21 0035   03/03/21 1600  ampicillin (OMNIPEN) 2 g in sodium chloride 0.9 % 100 mL IVPB        2 g 300 mL/hr over 20 Minutes Intravenous Every 4 hours 03/03/21 1525        Medications: Scheduled Meds: . vitamin C  500 mg Oral Daily  . Chlorhexidine Gluconate Cloth  6 each Topical Q0600  . cholecalciferol  1,000 Units Oral Daily  . enoxaparin (LOVENOX) injection  40 mg Subcutaneous Q24H  . feeding supplement  237 mL Oral BID BM  . furosemide  40 mg Oral Daily  . Ipratropium-Albuterol  1 puff Inhalation Q6H  . levothyroxine  175 mcg Oral QAC breakfast  . mouth rinse  15 mL Mouth Rinse BID  . methylPREDNISolone (SOLU-MEDROL) injection  60 mg Intravenous Q12H  . multivitamin with minerals  1 tablet Oral Daily  . mupirocin  ointment  1 application Nasal BID  . pantoprazole  40 mg Oral Daily  . traZODone  50 mg Oral QHS  . zinc sulfate  220 mg Oral Daily   Continuous Infusions: . sodium chloride 10 mL/hr at 03/06/21 0000  . sodium chloride Stopped (03/05/21 0323)  . ampicillin (OMNIPEN) IV 2 g (03/07/21 1200)  . cefTRIAXone (ROCEPHIN)  IV 2 g (03/07/21 0922)  . remdesivir 100 mg in NS 100 mL 100 mg (03/07/21 1015)   PRN Meds:.sodium chloride, sodium chloride, acetaminophen **OR** acetaminophen, albuterol, chlorpheniramine-HYDROcodone, fluticasone, guaiFENesin-dextromethorphan, hydrALAZINE    Objective: Weight change:   Intake/Output Summary (Last 24 hours) at 03/07/2021 1501 Last data filed at 03/07/2021 0800 Gross per 24 hour  Intake 1809.31 ml  Output 1000 ml  Net 809.31 ml   Blood pressure 124/67, pulse 90, temperature 97.6 F (36.4 C), temperature source Oral, resp. rate (!) 22, height  (1.854 m), weight 85.3 kg, SpO2 95 %. Temp:  [97.6 F (36.4 C)-98.1 F (36.7 C)] 97.6 F (36.4 C) (04/13 1158) Pulse Rate:  [89-100] 90 (04/13 1158) Resp:  [18-22] 22 (04/13 1158) BP: (  117-129)/(67-82) 124/67 (04/13 1158) SpO2:  [95 %-96 %] 95 % (04/13 1158)  Physical Exam: Physical Exam Constitutional:      Appearance: He is well-developed.  HENT:     Head: Normocephalic and atraumatic.  Eyes:     Conjunctiva/sclera: Conjunctivae normal.  Cardiovascular:     Rate and Rhythm: Normal rate and regular rhythm.     Heart sounds: Murmur heard.    Pulmonary:     Effort: Pulmonary effort is normal. No respiratory distress.     Breath sounds: No stridor. No wheezing or rhonchi.  Abdominal:     General: There is no distension.     Palpations: Abdomen is soft. There is no mass.  Musculoskeletal:        General: Normal range of motion.     Cervical back: Normal range of motion and neck supple.  Skin:    General: Skin is warm and dry.     Findings: No erythema or rash.  Neurological:     General:  No focal deficit present.     Mental Status: He is alert and oriented to person, place, and time.  Psychiatric:        Mood and Affect: Mood normal.        Behavior: Behavior normal.        Thought Content: Thought content normal.        Judgment: Judgment normal.      Left hand see pictures for 11/13/2021:      Left hand pictures today March 07, 2021:        Right hand today March 07, 2021: Note he states that the possible splinter there was present for several days at least if not longer.         CBC:    BMET Recent Labs    03/06/21 0057 03/07/21 0251  NA 132* 136  K 3.4* 4.3  CL 99 102  CO2 21* 26  GLUCOSE 116* 149*  BUN 15 19  CREATININE 0.89 1.01  CALCIUM 8.1* 8.5*     Liver Panel  Recent Labs    03/06/21 0057 03/07/21 0251  PROT 5.9* 6.3*  ALBUMIN 2.1* 2.1*  AST 25 23  ALT 47* 41  ALKPHOS 159* 138*  BILITOT 1.3* 0.9       Sedimentation Rate No results for input(s): ESRSEDRATE in the last 72 hours. C-Reactive Protein Recent Labs    03/06/21 0057 03/07/21 0251  CRP 22.5* 23.5*    Micro Results: Recent Results (from the past 720 hour(s))  Culture, blood (routine x 2)     Status: Abnormal   Collection Time: 03/02/21 11:50 AM   Specimen: BLOOD  Result Value Ref Range Status   Specimen Description   Final    BLOOD LEFT ANTECUBITAL Performed at [facility] [place], [address] [place], [place] [address]    Special Requests   Final    BOTTLES DRAWN AEROBIC AND ANAEROBIC Blood Culture adequate volume Performed at [facility] [place], [address] [place], [place] [address]    Culture  Setup Time   Final    GRAM POSITIVE COCCI IN BOTH AEROBIC AND ANAEROBIC BOTTLES CRITICAL VALUE NOTED.  VALUE IS CONSISTENT WITH PREVIOUSLY REPORTED AND CALLED VALUE.    Culture (A)  Final    ENTEROCOCCUS FAECALIS SUSCEPTIBILITIES PERFORMED ON PREVIOUS CULTURE WITHIN THE LAST 5 DAYS. Performed at [facility],  [address] [place], [place] [address]    Report Status  03/05/2021 FINAL  Final  Culture, blood (routine x 2)     Status: Abnormal   Collection Time: 03/02/21  1:10 PM   Specimen: Right Antecubital; Blood  Result Value Ref Range Status   Specimen Description   Final    RIGHT ANTECUBITAL BLOOD Performed at [facility], [address] [place], [place] [address]    Special Requests   Final    BOTTLES DRAWN AEROBIC AND ANAEROBIC Blood Culture adequate volume Performed at [facility], [address] [place], [place] [address]    Culture  Setup Time   Final    GRAM POSITIVE COCCI IN CHAINS IN BOTH AEROBIC AND ANAEROBIC BOTTLES CRITICAL RESULT CALLED TO, READ BACK BY AND VERIFIED WITH: Heywood Bene RN @1205  03/03/21 EB Performed at [facility], [address], [place], [place]    Culture ENTEROCOCCUS FAECALIS (A)  Final   Report Status 03/05/2021 FINAL  Final   Organism ID, Bacteria ENTEROCOCCUS FAECALIS  Final      Susceptibility   Enterococcus faecalis - MIC*    AMPICILLIN <=2 SENSITIVE Sensitive     VANCOMYCIN 1 SENSITIVE Sensitive     GENTAMICIN SYNERGY SENSITIVE Sensitive     * ENTEROCOCCUS FAECALIS  Blood Culture ID Panel (Reflexed)     Status: Abnormal   Collection Time: 03/02/21  1:10 PM  Result Value Ref Range Status   Enterococcus faecalis DETECTED (A) NOT DETECTED Final    Comment: CRITICAL RESULT CALLED TO, READ BACK BY AND VERIFIED WITH: S GOUGE RN @1203  03/03/21 EB    Enterococcus Faecium NOT DETECTED NOT DETECTED Final   Listeria monocytogenes NOT DETECTED NOT DETECTED Final   Staphylococcus species NOT DETECTED NOT DETECTED Final   Staphylococcus aureus (BCID) NOT DETECTED NOT DETECTED Final   Staphylococcus epidermidis NOT DETECTED NOT DETECTED Final   Staphylococcus lugdunensis NOT DETECTED NOT DETECTED Final   Streptococcus species NOT DETECTED NOT DETECTED Final   Streptococcus agalactiae NOT DETECTED NOT DETECTED Final    Streptococcus pneumoniae NOT DETECTED NOT DETECTED Final   Streptococcus pyogenes NOT DETECTED NOT DETECTED Final   A.calcoaceticus-baumannii NOT DETECTED NOT DETECTED Final   Bacteroides fragilis NOT DETECTED NOT DETECTED Final   Enterobacterales NOT DETECTED NOT DETECTED Final   Enterobacter cloacae complex NOT DETECTED NOT DETECTED Final   Escherichia coli NOT DETECTED NOT DETECTED Final   Klebsiella aerogenes NOT DETECTED NOT DETECTED Final   Klebsiella oxytoca NOT DETECTED NOT DETECTED Final   Klebsiella pneumoniae NOT DETECTED NOT DETECTED Final   Proteus species NOT DETECTED NOT DETECTED Final   Salmonella species NOT DETECTED NOT DETECTED Final   Serratia marcescens NOT DETECTED NOT DETECTED Final   Haemophilus influenzae NOT DETECTED NOT DETECTED Final   Neisseria meningitidis NOT DETECTED NOT DETECTED Final   Pseudomonas aeruginosa NOT DETECTED NOT DETECTED Final   Stenotrophomonas maltophilia NOT DETECTED NOT DETECTED Final   Candida albicans NOT DETECTED NOT DETECTED Final   Candida auris NOT DETECTED NOT DETECTED Final   Candida glabrata NOT DETECTED NOT DETECTED Final   Candida krusei NOT DETECTED NOT DETECTED Final   Candida parapsilosis NOT DETECTED NOT DETECTED Final   Candida tropicalis NOT DETECTED NOT DETECTED Final   Cryptococcus neoformans/gattii NOT DETECTED NOT DETECTED Final   Vancomycin resistance NOT DETECTED NOT DETECTED Final    Comment: Performed at [facility], [address], [place], [address] [place]  Resp Panel by RT-PCR (Flu A&B, Covid) Nasopharyngeal Swab  Status: None   Collection Time: 03/02/21  3:41 PM   Specimen: Nasopharyngeal Swab; Nasopharyngeal(NP) swabs in vial transport medium  Result Value Ref Range Status   SARS Coronavirus 2 by RT PCR NEGATIVE NEGATIVE Final    Comment: (NOTE) SARS-CoV-2 target nucleic acids are NOT DETECTED.  The SARS-CoV-2 RNA is generally detectable in upper respiratory specimens during the acute phase  of infection. The lowest concentration of SARS-CoV-2 viral copies this assay can detect is 138 copies/mL. A negative result does not preclude SARS-Cov-2 infection and should not be used as the sole basis for treatment or other patient management decisions. A negative result may occur with  improper specimen collection/handling, submission of specimen other than nasopharyngeal swab, presence of viral mutation(s) within the areas targeted by this assay, and inadequate number of viral copies(<138 copies/mL). A negative result must be combined with clinical observations, patient history, and epidemiological information. The expected result is Negative.  Fact Sheet for Patients:  BloggerCourse.com  Fact Sheet for Healthcare Providers:  SeriousBroker.it  This test is no t yet approved or cleared by the [place] FDA and  has been authorized for detection and/or diagnosis of SARS-CoV-2 by FDA under an Emergency Use Authorization (EUA). This EUA will remain  in effect (meaning this test can be used) for the duration of the COVID-19 declaration under Section 564(b)(1) of the Act, 21 U.S.C.section 360bbb-3(b)(1), unless the authorization is terminated  or revoked sooner.       Influenza A by PCR NEGATIVE NEGATIVE Final   Influenza B by PCR NEGATIVE NEGATIVE Final    Comment: (NOTE) The Xpert Xpress SARS-CoV-2/FLU/RSV plus assay is intended as an aid in the diagnosis of influenza from Nasopharyngeal swab specimens and should not be used as a sole basis for treatment. Nasal washings and aspirates are unacceptable for Xpert Xpress SARS-CoV-2/FLU/RSV testing.  Fact Sheet for Patients: BloggerCourse.com  Fact Sheet for Healthcare Providers: SeriousBroker.it  This test is not yet approved or cleared by the [place] FDA and has been authorized for detection and/or diagnosis of  SARS-CoV-2 by FDA under an Emergency Use Authorization (EUA). This EUA will remain in effect (meaning this test can be used) for the duration of the COVID-19 declaration under Section 564(b)(1) of the Act, 21 U.S.C. section 360bbb-3(b)(1), unless the authorization is terminated or revoked.  Performed at [facility], [address] [place], [place] [address]   Blood culture (routine x 2)     Status: None (Preliminary result)   Collection Time: 03/03/21  1:58 PM   Specimen: BLOOD  Result Value Ref Range Status   Specimen Description   Final    BLOOD LEFT ANTECUBITAL Performed at [facility], [address], [place], [place] [address]    Special Requests   Final    BOTTLES DRAWN AEROBIC AND ANAEROBIC Blood Culture adequate volume Performed at [facility], [address], [place], [place] [address]    Culture   Final    NO GROWTH 4 DAYS Performed at [facility], [address] [place], [place] [address]    Report Status PENDING  Incomplete  Resp Panel by RT-PCR (Flu A&B, Covid) Nasopharyngeal Swab     Status: Abnormal   Collection Time: 03/03/21  3:08 PM   Specimen: Nasopharyngeal Swab; Nasopharyngeal(NP) swabs in vial transport medium  Result Value Ref Range Status   SARS Coronavirus 2 by RT PCR POSITIVE (A) NEGATIVE Final    Comment: RESULT CALLED TO,  READ BACK BY AND VERIFIED WITH: BOWEN,M. RN AT 1628 03/03/21 MULLINS,T (NOTE) SARS-CoV-2 target nucleic acids are DETECTED.  The SARS-CoV-2 RNA is generally detectable in upper respiratory specimens during the acute phase of infection. Positive results are indicative of the presence of the identified virus, but do not rule out bacterial infection or co-infection with other pathogens not detected by the test. Clinical correlation with patient history and other diagnostic information is necessary to determine patient infection status. The expected result is  Negative.  Fact Sheet for Patients: BloggerCourse.com  Fact Sheet for Healthcare Providers: SeriousBroker.it  This test is not yet approved or cleared by the [place] FDA and  has been authorized for detection and/or diagnosis of SARS-CoV-2 by FDA under an Emergency Use Authorization (EUA).  This EUA will remain in effect (meaning this test c an be used) for the duration of  the COVID-19 declaration under Section 564(b)(1) of the Act, 21 U.S.C. section 360bbb-3(b)(1), unless the authorization is terminated or revoked sooner.     Influenza A by PCR NEGATIVE NEGATIVE Final   Influenza B by PCR NEGATIVE NEGATIVE Final    Comment: (NOTE) The Xpert Xpress SARS-CoV-2/FLU/RSV plus assay is intended as an aid in the diagnosis of influenza from Nasopharyngeal swab specimens and should not be used as a sole basis for treatment. Nasal washings and aspirates are unacceptable for Xpert Xpress SARS-CoV-2/FLU/RSV testing.  Fact Sheet for Patients: BloggerCourse.com  Fact Sheet for Healthcare Providers: SeriousBroker.it  This test is not yet approved or cleared by the [place] FDA and has been authorized for detection and/or diagnosis of SARS-CoV-2 by FDA under an Emergency Use Authorization (EUA). This EUA will remain in effect (meaning this test can be used) for the duration of the COVID-19 declaration under Section 564(b)(1) of the Act, 21 U.S.C. section 360bbb-3(b)(1), unless the authorization is terminated or revoked.  Performed at [facility], [address], [place], [place] [address]   Urine culture     Status: None   Collection Time: 03/03/21  3:08 PM   Specimen: In/Out Cath Urine  Result Value Ref Range Status   Specimen Description   Final    IN/OUT CATH URINE Performed at [facility], [address], [place], [place]  [address]    Special Requests   Final    NONE Performed at [facility], [address], [place], [place] [address]    Culture   Final    NO GROWTH Performed at [facility], [address], [place], [place] [address]    Report Status 03/04/2021 FINAL  Final  Blood culture (routine x 2)     Status: None (Preliminary result)   Collection Time: 03/03/21  3:41 PM   Specimen: BLOOD  Result Value Ref Range Status   Specimen Description   Final    BLOOD RIGHT ANTECUBITAL Performed at [facility], [address], [place], [place] [address]    Special Requests   Final    BOTTLES DRAWN AEROBIC AND ANAEROBIC Blood Culture adequate volume Performed at [facility], [address], [place], [place] [address]    Culture   Final    NO GROWTH 4 DAYS Performed at [facility], [address] [place], [place] [address]    Report Status PENDING  Incomplete  MRSA PCR Screening     Status: Abnormal   Collection Time: 03/04/21 11:53 PM   Specimen: Nasopharyngeal  Result Value  Ref Range Status   MRSA by PCR POSITIVE (A) NEGATIVE Final    Comment:        The GeneXpert MRSA Assay (FDA approved for NASAL specimens only), is one component of a comprehensive MRSA colonization surveillance program. It is not intended to diagnose MRSA infection nor to guide or monitor treatment for MRSA infections. RESULT CALLED TO, READ BACK BY AND VERIFIED WITH: [facility] RN AT 0121 03/05/2021 MITCHELL,L Performed at [facility], [address], [place], [place] [address]     Studies/Results: DG Chest Port 1 View  Result Date: 03/06/2021 CLINICAL DATA:  70 year old male positive COVID-19 on 03/03/2021. Shortness of breath. EXAM: PORTABLE CHEST 1 VIEW COMPARISON:  Portable chest 03/03/2021. FINDINGS: Portable AP semi upright view at 0830 hours. Progressed and extensive airspace opacity now in the right upper lobe, about both hila. Left  lung apex and the lateral costophrenic angles remain relatively spared. Stable lung volumes and mediastinal contours. No pneumothorax. Visualized tracheal air column is within normal limits. Prior thyroidectomy and left clavicle ORIF. IMPRESSION: Progressed bilateral pneumonia, now moderate to severe. No pneumothorax or pleural effusion. Electronically Signed   By: Odessa Fleming M.D.   On: 03/06/2021 08:34      Assessment/Plan:  INTERVAL HISTORY: Possible Osler's node lesion is improving  Active Problems:   Sepsis (HCC)   Bacteremia   Endocarditis of mitral valve   COVID-19 virus infection   Osler node    Brandon [name] is a 70 y.o. male with prior history of COVID-19 infection smoldering symptoms with hindsight likely due to subacute bacterial endocarditis which she has now been proven to have on 2D echocardiogram with Enterococcus faecalis growing in blood.  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  He has another area on the right fingernail that he showed me today that I did not notice when I looked at him yesterday but that he says has been present for longer.  Need to monitor for further stigmata in particular evidence of septic embolization on antibiotic therapy as this would be a reason for more urgent cardiothoracic surgery.  Patient has been seen by Dr. Dorris Fetch with cardiothoracic surgery.  He does not see an urgent indication for surgery at this point in time.  Patient will need a transesophageal echocardiogram  To understand may be tomorrow.   Continue ceftriaxone and ampicillin   #2 COVID 19: Second infection with this virus.  He is on remdesivir and steroids.    LOS: 4 days    Acey Lav 03/07/2021, 3:01 PM

## 2021-03-07 NOTE — Progress Notes (Signed)
PROGRESS NOTE    Brandon [name]  NLZ:[id number] DOB: June 11, 1951 DOA: 03/03/2021 PCP: Kristen Loader, FNP     Brief Narrative:  Brandon Bluestein Nelsenis a 70 y.o.WM PMHx prior covid pna, hypothyroid, HLD   Presented to ED on 4/8 with complaints of fevers, sob, nonproductive cough. During workup, pt was found to have evidence suggesting RUL pna. Pt was recommended admission, however, pt declined and was ultimately discharged from the ED on augmentin and azithromycin. Blood cultures later returned pos for enterococcus and pt was asked to return to ED. Pt reports generalized weakness, increased cough, decreased exercise tolerance. Also reports fevers - in the ED, pt was found to be tachycardic with tachypnea. WBC noted to be 17.6. Blood cx from 4/8 confirmed enterococcus. ID was consulted and hospitalist requested for admission.  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  As well he is diagnosed with COVID-19 of pneumonia, he is vaccinated, and had previous infection last August.   Subjective: Afebrile overnight, A/O x4.  Positive S OB.  Positive cough with speaking or deep inspiration.   Assessment & Plan: Covid vaccination; vaccinated 3/3   Active Problems:   Sepsis ([place])   Bacteremia   Endocarditis of mitral valve   COVID-19 virus infection   Osler node   Acute respiratory failure with hypoxia ([place])   Pneumonia due to COVID-19 virus   CAP (community acquired pneumonia)   Elevated liver enzymes  Enterococcus bacteremia -Continue current antibiotics -4/14 will curbside ID length of treatment 2 weeks?   Sepsis POA - Presented with fevers, leukocytosis, tachycardia in the setting of Enterococcus bacteremia and endocarditis - ID following, appreciate insight recommendations currently on ampicillin and ceftriaxone  Acute hypoxic respiratory failure due to COVID-19 of  pneumonia/ CAP pneumonia  COVID-19 Labs  Recent Labs    03/05/21 0042 03/06/21 0057 03/07/21 0251  FERRITIN 615*  --   --   CRP 20.8* 22.5* 23.5*    Lab Results  Component Value Date   SARSCOV2NAA POSITIVE (A) 03/03/2021   [place] NEGATIVE 03/02/2021    -Remdesivir per pharmacy protocol -Solu-Medrol 60 mg BID -Vitamins per COVID protocol -Incentive spirometry -Flutter valve -Combivent QID -Given his endocarditis, and bacteremia, cannot receive baricitinib or Actemra -Continue current antibiotics which will cover both CAP and endocarditis  Endocarditis mitral valve -TTE remarkable for 1 x 1.5 cm vegetation on the mitral valve.  Cardiology consulted for TEE, timing per cardiology  -Continue antibiotics per ID recommendations. -4/13 repeat TEE on 4/14 -CT surgery consulted as well, further recommendation pending TEE and improvement of COVID-19.  Discussed with cardiology, plan for TEE on Thursday at 2 PM. -me an area on his finger that may represent an Osler's node as he has a tender area on his fingertip he also has a possible splinter as seen on picture above. -Need to monitor for further stigmata in particular evidence of septic embolization on antibiotic therapy as this would be a reason for more urgent cardiothoracic surgery. Dr. Roxan Hockey with cardiothoracic surgery.  He does not see an urgent indication for surgery at this point in time. -Decision will be made on treatment options after repeat TEE  Elevated AST/ALT/alk phos  -Appears to be somewhat chronic, mild, trending down-unclear baseline -Downtrending since admission likely shock liver in the setting of sepsis as well due to Covid. -2018 labs markedly elevated (AST439/ALT760/AlkP230)  -unremarkable work-up at that time;  considered for outpatient follow-up for hemochromatosis versus autoimmune work-up but does not appear to be any documentation in our system that this was ever done -4/13 AST/ALT normalized  alkaline phosphatase just slightly above normal  Hypothyroid - TSH 2.5 - Continue home thyroid replacement  Normocytic anemia - Likely hemodilutional.     DVT prophylaxis: Lovenox Code Status: Full Family Communication:  Status is: Inpatient    Dispo: The patient is from: Home              Anticipated d/c is to: Home vs SNF              Anticipated d/c date is:??              Patient currently unstable      Consultants:  Dr. Roxan Hockey with cardiothoracic surgery ID Dr. Tommy Medal [facility]   Procedures/Significant Events:    I have personally reviewed and interpreted all radiology studies and my findings are as above.  VENTILATOR SETTINGS: Nasal cannula 4/13 Flow 2.5 L/min SPO2 90%    Cultures   Antimicrobials: Anti-infectives (From admission, onward)   Start     Ordered Stop   03/05/21 1000  remdesivir 100 mg in sodium chloride 0.9 % 100 mL IVPB       "Followed by" Linked Group Details   03/04/21 0035 03/09/21 0959   03/04/21 1000  remdesivir 100 mg in sodium chloride 0.9 % 100 mL IVPB  Status:  Discontinued       "Followed by" Linked Group Details   03/03/21 1806 03/04/21 0035   03/04/21 1000  remdesivir 200 mg in sodium chloride 0.9% 250 mL IVPB       "Followed by" Linked Group Details   03/04/21 0035 03/04/21 1230   03/03/21 2000  cefTRIAXone (ROCEPHIN) 2 g in sodium chloride 0.9 % 100 mL IVPB        03/03/21 1708     03/03/21 2000  remdesivir 200 mg in sodium chloride 0.9% 250 mL IVPB  Status:  Discontinued       "Followed by" Linked Group Details   03/03/21 1806 03/04/21 0035   03/03/21 1600  ampicillin (OMNIPEN) 2 g in sodium chloride 0.9 % 100 mL IVPB        03/03/21 1525         Devices    LINES / TUBES:      Continuous Infusions: . sodium chloride 10 mL/hr at 03/06/21 0000  . sodium chloride Stopped (03/05/21 0323)  . ampicillin (OMNIPEN) IV 2 g (03/07/21 1610)  . cefTRIAXone (ROCEPHIN)  IV 2 g (03/07/21 0922)  .  remdesivir 100 mg in NS 100 mL 100 mg (03/07/21 1015)     Objective: Vitals:   03/07/21 0358 03/07/21 0818 03/07/21 1158 03/07/21 1557  BP: 124/79 117/82 124/67 126/77  Pulse: 89 99 90 89  Resp: (!) 22 18 (!) 22 19  Temp: 97.7 F (36.5 C) 97.6 F (36.4 C) 97.6 F (36.4 C) 97.7 F (36.5 C)  TempSrc: Oral Oral Oral Oral  SpO2: 95% 95% 95% 94%  Weight:      Height:        Intake/Output Summary (Last 24 hours) at 03/07/2021 2012 Last data filed at 03/07/2021 1700 Gross per 24 hour  Intake 2228.31 ml  Output 1000 ml  Net 1228.31 ml   Filed Weights   03/03/21 1835  Weight: 85.3 kg    Examination:  General: A/O x4, positive acute respiratory distress Eyes: negative scleral hemorrhage,  negative anisocoria, negative icterus ENT: Negative Runny nose, negative gingival bleeding, Neck:  Negative scars, masses, torticollis, lymphadenopathy, JVD Lungs: Clear to auscultation bilaterally, positive cough with speaking or deep inspiration.   Cardiovascular: Regular rate and rhythm without murmur gallop or rub normal S1 and S2 Abdomen: negative abdominal pain, nondistended, positive soft, bowel sounds, no rebound, no ascites, no appreciable mass Extremities: No significant cyanosis, clubbing, or edema bilateral lower extremities Skin: Negative rashes, lesions, ulcers Psychiatric:  Negative depression, positive anxiety, negative fatigue, negative mania  Central nervous system:  Cranial nerves II through XII intact, tongue/uvula midline, all extremities muscle strength 5/5, sensation intact throughout, negative dysarthria, negative expressive aphasia, negative receptive aphasia.  .     Data Reviewed: Care during the described time interval was provided by me .  I have reviewed this patient's available data, including medical history, events of note, physical examination, and all test results as part of my evaluation.  CBC: Recent Labs  Lab 03/02/21 1138 03/03/21 1358 03/03/21 1907  03/04/21 0737 03/05/21 0042 03/06/21 0057 03/07/21 0251  WBC 14.1* 17.6* 16.7* 12.6* 13.7* 16.2* 13.4*  NEUTROABS 13.0* 15.4*  --   --   --   --   --   HGB 11.7* 12.4* 11.9* 10.7* 11.1* 10.6* 10.7*  HCT 36.2* 38.4* 37.8* 33.3* 33.7* 32.6* 32.6*  MCV 92.6 94.1 95.2 94.6 94.7 94.2 95.3  PLT 314 333 340 296 325 287 209   Basic Metabolic Panel: Recent Labs  Lab 03/03/21 1358 03/03/21 1907 03/04/21 0737 03/05/21 0042 03/06/21 0057 03/07/21 0251  NA 136  --  135 131* 132* 136  K 4.0  --  3.6 3.5 3.4* 4.3  CL 102  --  103 100 99 102  CO2 24  --  24 24 21* 26  GLUCOSE 99  --  95 98 116* 149*  BUN 28*  --  '23 17 15 19  ' CREATININE 1.08 1.07 1.06 1.18 0.89 1.01  CALCIUM 8.9  --  8.1* 8.0* 8.1* 8.5*   GFR: Estimated Creatinine Clearance: 79.1 mL/min (by C-G formula based on SCr of 1.01 mg/dL). Liver Function Tests: Recent Labs  Lab 03/03/21 1358 03/04/21 0737 03/05/21 0042 03/06/21 0057 03/07/21 0251  AST 39 23 33 25 23  ALT 80* 55* 63* 47* 41  ALKPHOS 223* 166* 181* 159* 138*  BILITOT 0.8 0.6 1.0 1.3* 0.9  PROT 7.6 6.1* 6.3* 5.9* 6.3*  ALBUMIN 3.3* 2.6* 2.4* 2.1* 2.1*   Recent Labs  Lab 03/02/21 1138  LIPASE 27   No results for input(s): AMMONIA in the last 168 hours. Coagulation Profile: Recent Labs  Lab 03/02/21 1138 03/03/21 1515  INR 1.1 1.1   Cardiac Enzymes: Recent Labs  Lab 03/02/21 1138  CKTOTAL 14*   BNP (last 3 results) No results for input(s): PROBNP in the last 8760 hours. HbA1C: No results for input(s): HGBA1C in the last 72 hours. CBG: No results for input(s): GLUCAP in the last 168 hours. Lipid Profile: No results for input(s): CHOL, HDL, LDLCALC, TRIG, CHOLHDL, LDLDIRECT in the last 72 hours. Thyroid Function Tests: No results for input(s): TSH, T4TOTAL, FREET4, T3FREE, THYROIDAB in the last 72 hours. Anemia Panel: Recent Labs    03/05/21 0042  FERRITIN 615*   Sepsis Labs: Recent Labs  Lab 03/02/21 1138 03/03/21 1358  03/06/21 0057 03/07/21 0251  PROCALCITON  --   --  0.25 0.19  LATICACIDVEN 1.8 1.1  --   --     Recent Results (from the past  240 hour(s))  Culture, blood (routine x 2)     Status: Abnormal   Collection Time: 03/02/21 11:50 AM   Specimen: BLOOD  Result Value Ref Range Status   Specimen Description   Final    BLOOD LEFT ANTECUBITAL Performed at [facility], [address] [place], [place] [address]    Special Requests   Final    BOTTLES DRAWN AEROBIC AND ANAEROBIC Blood Culture adequate volume Performed at [facility], [address] [place], [place] [address]    Culture  Setup Time   Final    GRAM POSITIVE COCCI IN BOTH AEROBIC AND ANAEROBIC BOTTLES CRITICAL VALUE NOTED.  VALUE IS CONSISTENT WITH PREVIOUSLY REPORTED AND CALLED VALUE.    Culture (A)  Final    ENTEROCOCCUS FAECALIS SUSCEPTIBILITIES PERFORMED ON PREVIOUS CULTURE WITHIN THE LAST 5 DAYS. Performed at [facility], [place] [address] [place], [place] [address]    Report Status 03/05/2021 FINAL  Final  Culture, blood (routine x 2)     Status: Abnormal   Collection Time: 03/02/21  1:10 PM   Specimen: Right Antecubital; Blood  Result Value Ref Range Status   Specimen Description   Final    RIGHT ANTECUBITAL BLOOD Performed at [facility], [address] [place], [place] [address]    Special Requests   Final    BOTTLES DRAWN AEROBIC AND ANAEROBIC Blood Culture adequate volume Performed at [facility], [address] [place], [place] [address]    Culture  Setup Time   Final    GRAM POSITIVE COCCI IN CHAINS IN BOTH AEROBIC AND ANAEROBIC BOTTLES CRITICAL RESULT CALLED TO, READ BACK BY AND VERIFIED WITH: Josph Macho RN '@1205'  03/03/21 EB Performed at [facility], [place] [address], [place], [place] [address]    Culture ENTEROCOCCUS FAECALIS (A)  Final   Report Status 03/05/2021 FINAL  Final   Organism ID, Bacteria ENTEROCOCCUS FAECALIS  Final       Susceptibility   Enterococcus faecalis - MIC*    AMPICILLIN <=2 SENSITIVE Sensitive     VANCOMYCIN 1 SENSITIVE Sensitive     GENTAMICIN SYNERGY SENSITIVE Sensitive     * ENTEROCOCCUS FAECALIS  Blood Culture ID Panel (Reflexed)     Status: Abnormal   Collection Time: 03/02/21  1:10 PM  Result Value Ref Range Status   Enterococcus faecalis DETECTED (A) NOT DETECTED Final    Comment: CRITICAL RESULT CALLED TO, READ BACK BY AND VERIFIED WITH: S GOUGE RN '@1203'  03/03/21 EB    Enterococcus Faecium NOT DETECTED NOT DETECTED Final   Listeria monocytogenes NOT DETECTED NOT DETECTED Final   Staphylococcus species NOT DETECTED NOT DETECTED Final   Staphylococcus aureus (BCID) NOT DETECTED NOT DETECTED Final   Staphylococcus epidermidis NOT DETECTED NOT DETECTED Final   Staphylococcus lugdunensis NOT DETECTED NOT DETECTED Final   Streptococcus species NOT DETECTED NOT DETECTED Final   Streptococcus agalactiae NOT DETECTED NOT DETECTED Final   Streptococcus pneumoniae NOT DETECTED NOT DETECTED Final   Streptococcus pyogenes NOT DETECTED NOT DETECTED Final   A.calcoaceticus-baumannii NOT DETECTED NOT DETECTED Final   Bacteroides fragilis NOT DETECTED NOT DETECTED Final   Enterobacterales NOT DETECTED NOT DETECTED Final   Enterobacter cloacae complex NOT DETECTED NOT DETECTED Final   Escherichia coli NOT DETECTED NOT DETECTED Final   Klebsiella aerogenes NOT DETECTED NOT DETECTED Final   Klebsiella oxytoca NOT DETECTED NOT DETECTED Final   Klebsiella pneumoniae NOT  DETECTED NOT DETECTED Final   Proteus species NOT DETECTED NOT DETECTED Final   Salmonella species NOT DETECTED NOT DETECTED Final   Serratia marcescens NOT DETECTED NOT DETECTED Final   Haemophilus influenzae NOT DETECTED NOT DETECTED Final   Neisseria meningitidis NOT DETECTED NOT DETECTED Final   Pseudomonas aeruginosa NOT DETECTED NOT DETECTED Final   Stenotrophomonas maltophilia NOT DETECTED NOT DETECTED Final   Candida albicans  NOT DETECTED NOT DETECTED Final   Candida auris NOT DETECTED NOT DETECTED Final   Candida glabrata NOT DETECTED NOT DETECTED Final   Candida krusei NOT DETECTED NOT DETECTED Final   Candida parapsilosis NOT DETECTED NOT DETECTED Final   Candida tropicalis NOT DETECTED NOT DETECTED Final   Cryptococcus neoformans/gattii NOT DETECTED NOT DETECTED Final   Vancomycin resistance NOT DETECTED NOT DETECTED Final    Comment: Performed at [facility], [place] [address], [place], [place] [address]  Resp Panel by RT-PCR (Flu A&B, Covid) Nasopharyngeal Swab     Status: None   Collection Time: 03/02/21  3:41 PM   Specimen: Nasopharyngeal Swab; Nasopharyngeal(NP) swabs in vial transport medium  Result Value Ref Range Status   SARS Coronavirus 2 by RT PCR NEGATIVE NEGATIVE Final    Comment: (NOTE) SARS-CoV-2 target nucleic acids are NOT DETECTED.  The SARS-CoV-2 RNA is generally detectable in upper respiratory specimens during the acute phase of infection. The lowest concentration of SARS-CoV-2 viral copies this assay can detect is 138 copies/mL. A negative result does not preclude SARS-Cov-2 infection and should not be used as the sole basis for treatment or other patient management decisions. A negative result may occur with  improper specimen collection/handling, submission of specimen other than nasopharyngeal swab, presence of viral mutation(s) within the areas targeted by this assay, and inadequate number of viral copies(<138 copies/mL). A negative result must be combined with clinical observations, patient history, and epidemiological information. The expected result is Negative.  Fact Sheet for Patients:  EntrepreneurPulse.com.au  Fact Sheet for Healthcare Providers:  IncredibleEmployment.be  This test is no t yet approved or cleared by the [place] FDA and  has been authorized for detection and/or diagnosis of SARS-CoV-2 by FDA under an  Emergency Use Authorization (EUA). This EUA will remain  in effect (meaning this test can be used) for the duration of the COVID-19 declaration under Section 564(b)(1) of the Act, 21 U.S.C.section 360bbb-3(b)(1), unless the authorization is terminated  or revoked sooner.       Influenza A by PCR NEGATIVE NEGATIVE Final   Influenza B by PCR NEGATIVE NEGATIVE Final    Comment: (NOTE) The Xpert Xpress SARS-CoV-2/FLU/RSV plus assay is intended as an aid in the diagnosis of influenza from Nasopharyngeal swab specimens and should not be used as a sole basis for treatment. Nasal washings and aspirates are unacceptable for Xpert Xpress SARS-CoV-2/FLU/RSV testing.  Fact Sheet for Patients: EntrepreneurPulse.com.au  Fact Sheet for Healthcare Providers: IncredibleEmployment.be  This test is not yet approved or cleared by the [place] FDA and has been authorized for detection and/or diagnosis of SARS-CoV-2 by FDA under an Emergency Use Authorization (EUA). This EUA will remain in effect (meaning this test can be used) for the duration of the COVID-19 declaration under Section 564(b)(1) of the Act, 21 U.S.C. section 360bbb-3(b)(1), unless the authorization is terminated or revoked.  Performed at [facility], [address] [place], [place] [address]   Blood culture (routine x 2)     Status: None (Preliminary result)  Collection Time: 03/03/21  1:58 PM   Specimen: BLOOD  Result Value Ref Range Status   Specimen Description   Final    BLOOD LEFT ANTECUBITAL Performed at [facility] [address] [place], [place] [address]    Special Requests   Final    BOTTLES DRAWN AEROBIC AND ANAEROBIC Blood Culture adequate volume Performed at [facility] [address] [place], [place] [address]    Culture   Final    NO GROWTH 4 DAYS Performed at [facility], [place] [address], [place],  [place] [address]    Report Status PENDING  Incomplete  Resp Panel by RT-PCR (Flu A&B, Covid) Nasopharyngeal Swab     Status: Abnormal   Collection Time: 03/03/21  3:08 PM   Specimen: Nasopharyngeal Swab; Nasopharyngeal(NP) swabs in vial transport medium  Result Value Ref Range Status   SARS Coronavirus 2 by RT PCR POSITIVE (A) NEGATIVE Final    Comment: RESULT CALLED TO, READ BACK BY AND VERIFIED WITH: BOWEN,M. RN AT 1628 03/03/21 MULLINS,T (NOTE) SARS-CoV-2 target nucleic acids are DETECTED.  The SARS-CoV-2 RNA is generally detectable in upper respiratory specimens during the acute phase of infection. Positive results are indicative of the presence of the identified virus, but do not rule out bacterial infection or co-infection with other pathogens not detected by the test. Clinical correlation with patient history and other diagnostic information is necessary to determine patient infection status. The expected result is Negative.  Fact Sheet for Patients: EntrepreneurPulse.com.au  Fact Sheet for Healthcare Providers: IncredibleEmployment.be  This test is not yet approved or cleared by the [place] FDA and  has been authorized for detection and/or diagnosis of SARS-CoV-2 by FDA under an Emergency Use Authorization (EUA).  This EUA will remain in effect (meaning this test c an be used) for the duration of  the COVID-19 declaration under Section 564(b)(1) of the Act, 21 U.S.C. section 360bbb-3(b)(1), unless the authorization is terminated or revoked sooner.     Influenza A by PCR NEGATIVE NEGATIVE Final   Influenza B by PCR NEGATIVE NEGATIVE Final    Comment: (NOTE) The Xpert Xpress SARS-CoV-2/FLU/RSV plus assay is intended as an aid in the diagnosis of influenza from Nasopharyngeal swab specimens and should not be used as a sole basis for treatment. Nasal washings and aspirates are unacceptable for Xpert Xpress  SARS-CoV-2/FLU/RSV testing.  Fact Sheet for Patients: EntrepreneurPulse.com.au  Fact Sheet for Healthcare Providers: IncredibleEmployment.be  This test is not yet approved or cleared by the [place] FDA and has been authorized for detection and/or diagnosis of SARS-CoV-2 by FDA under an Emergency Use Authorization (EUA). This EUA will remain in effect (meaning this test can be used) for the duration of the COVID-19 declaration under Section 564(b)(1) of the Act, 21 U.S.C. section 360bbb-3(b)(1), unless the authorization is terminated or revoked.  Performed at [facility], [place] [address] [place], [place] [address]   Urine culture     Status: None   Collection Time: 03/03/21  3:08 PM   Specimen: In/Out Cath Urine  Result Value Ref Range Status   Specimen Description   Final    IN/OUT CATH URINE Performed at [facility] [address] [place], [place] [address]    Special Requests   Final    NONE Performed at [facility], [place] [address] [place], [place] [address]    Culture   Final    NO GROWTH Performed at  [facility], [place] [address], [place], [place] [address]    Report Status 03/04/2021 FINAL  Final  Blood culture (routine x 2)     Status: None (Preliminary result)   Collection Time: 03/03/21  3:41 PM   Specimen: BLOOD  Result Value Ref Range Status   Specimen Description   Final    BLOOD RIGHT ANTECUBITAL Performed at [facility] [address], [place], [place] [address]    Special Requests   Final    BOTTLES DRAWN AEROBIC AND ANAEROBIC Blood Culture adequate volume Performed at [facility] [address] [place], [place] [address]    Culture   Final    NO GROWTH 4 DAYS Performed at [facility], [place] [address] [place], [place] [address]    Report Status PENDING  Incomplete  MRSA PCR Screening      Status: Abnormal   Collection Time: 03/04/21 11:53 PM   Specimen: Nasopharyngeal  Result Value Ref Range Status   MRSA by PCR POSITIVE (A) NEGATIVE Final    Comment:        The GeneXpert MRSA Assay (FDA approved for NASAL specimens only), is one component of a comprehensive MRSA colonization surveillance program. It is not intended to diagnose MRSA infection nor to guide or monitor treatment for MRSA infections. RESULT CALLED TO, READ BACK BY AND VERIFIED WITH: [facility] RN AT 0121 03/05/2021 MITCHELL,L Performed at [facility], [place] [address], [place], [place] [address]          Radiology Studies: DG Chest [place] 1 View  Result Date: 03/06/2021 CLINICAL DATA:  70 year old male positive COVID-19 on 03/03/2021. Shortness of breath. EXAM: PORTABLE CHEST 1 VIEW COMPARISON:  Portable chest 03/03/2021. FINDINGS: Portable AP semi upright view at 0830 hours. Progressed and extensive airspace opacity now in the right upper lobe, about both hila. Left lung apex and the lateral costophrenic angles remain relatively spared. Stable lung volumes and mediastinal contours. No pneumothorax. Visualized tracheal air column is within normal limits. Prior thyroidectomy and left clavicle ORIF. IMPRESSION: Progressed bilateral pneumonia, now moderate to severe. No pneumothorax or pleural effusion. Electronically Signed   By: Genevie Ann M.D.   On: 03/06/2021 08:34        Scheduled Meds: . vitamin C  500 mg Oral Daily  . Chlorhexidine Gluconate Cloth  6 each Topical Q0600  . cholecalciferol  1,000 Units Oral Daily  . enoxaparin (LOVENOX) injection  40 mg Subcutaneous Q24H  . feeding supplement  237 mL Oral BID BM  . furosemide  40 mg Oral Daily  . Ipratropium-Albuterol  1 puff Inhalation Q6H  . levothyroxine  175 mcg Oral QAC breakfast  . mouth rinse  15 mL Mouth Rinse BID  . methylPREDNISolone (SOLU-MEDROL) injection  60 mg Intravenous Q12H  . multivitamin with minerals  1 tablet Oral Daily  .  mupirocin ointment  1 application Nasal BID  . pantoprazole  40 mg Oral Daily  . traZODone  50 mg Oral QHS  . zinc sulfate  220 mg Oral Daily   Continuous Infusions: . sodium chloride 10 mL/hr at 03/06/21 0000  . sodium chloride Stopped (03/05/21 0323)  . ampicillin (OMNIPEN) IV 2 g (03/07/21 1610)  . cefTRIAXone (ROCEPHIN)  IV 2 g (03/07/21 0922)  . remdesivir 100 mg in NS 100 mL 100 mg (03/07/21 1015)     LOS: 4 days    Time spent:40 min    Lynisha Osuch, Geraldo Docker, MD  [facility]   If 7PM-7AM, please contact night-coverage 03/07/2021, 8:12 PM

## 2021-03-08 ENCOUNTER — Inpatient Hospital Stay (HOSPITAL_COMMUNITY): Payer: Medicare Other

## 2021-03-08 DIAGNOSIS — U071 COVID-19: Secondary | ICD-10-CM | POA: Diagnosis not present

## 2021-03-08 DIAGNOSIS — R7881 Bacteremia: Secondary | ICD-10-CM | POA: Diagnosis not present

## 2021-03-08 DIAGNOSIS — I059 Rheumatic mitral valve disease, unspecified: Secondary | ICD-10-CM | POA: Diagnosis not present

## 2021-03-08 DIAGNOSIS — A419 Sepsis, unspecified organism: Secondary | ICD-10-CM | POA: Diagnosis not present

## 2021-03-08 LAB — CBC WITH DIFFERENTIAL/PLATELET
Abs Immature Granulocytes: 0.11 10*3/uL — ABNORMAL HIGH (ref 0.00–0.07)
Basophils Absolute: 0 10*3/uL (ref 0.0–0.1)
Basophils Relative: 0 %
Eosinophils Absolute: 0 10*3/uL (ref 0.0–0.5)
Eosinophils Relative: 0 %
HCT: 30 % — ABNORMAL LOW (ref 39.0–52.0)
Hemoglobin: 9.9 g/dL — ABNORMAL LOW (ref 13.0–17.0)
Immature Granulocytes: 1 %
Lymphocytes Relative: 6 %
Lymphs Abs: 0.8 10*3/uL (ref 0.7–4.0)
MCH: 31 pg (ref 26.0–34.0)
MCHC: 33 g/dL (ref 30.0–36.0)
MCV: 94 fL (ref 80.0–100.0)
Monocytes Absolute: 0.4 10*3/uL (ref 0.1–1.0)
Monocytes Relative: 3 %
Neutro Abs: 11.7 10*3/uL — ABNORMAL HIGH (ref 1.7–7.7)
Neutrophils Relative %: 90 %
Platelets: 402 10*3/uL — ABNORMAL HIGH (ref 150–400)
RBC: 3.19 MIL/uL — ABNORMAL LOW (ref 4.22–5.81)
RDW: 13.6 % (ref 11.5–15.5)
WBC: 13 10*3/uL — ABNORMAL HIGH (ref 4.0–10.5)
nRBC: 0 % (ref 0.0–0.2)

## 2021-03-08 LAB — C-REACTIVE PROTEIN: CRP: 11 mg/dL — ABNORMAL HIGH (ref <1.0)

## 2021-03-08 LAB — COMPREHENSIVE METABOLIC PANEL
ALT: 46 U/L — ABNORMAL HIGH (ref 0–44)
AST: 26 U/L (ref 15–41)
Albumin: 2 g/dL — ABNORMAL LOW (ref 3.5–5.0)
Alkaline Phosphatase: 122 U/L (ref 38–126)
Anion gap: 8 (ref 5–15)
BUN: 27 mg/dL — ABNORMAL HIGH (ref 8–23)
CO2: 26 mmol/L (ref 22–32)
Calcium: 8.3 mg/dL — ABNORMAL LOW (ref 8.9–10.3)
Chloride: 104 mmol/L (ref 98–111)
Creatinine, Ser: 0.91 mg/dL (ref 0.61–1.24)
GFR, Estimated: >60 mL/min (ref >60)
Glucose, Bld: 135 mg/dL — ABNORMAL HIGH (ref 70–99)
Potassium: 4 mmol/L (ref 3.5–5.1)
Sodium: 138 mmol/L (ref 135–145)
Total Bilirubin: 0.5 mg/dL (ref 0.3–1.2)
Total Protein: 5.6 g/dL — ABNORMAL LOW (ref 6.5–8.1)

## 2021-03-08 LAB — LACTATE DEHYDROGENASE: LDH: 158 U/L (ref 98–192)

## 2021-03-08 LAB — CULTURE, BLOOD (ROUTINE X 2)
Culture: NO GROWTH
Culture: NO GROWTH
Special Requests: ADEQUATE
Special Requests: ADEQUATE

## 2021-03-08 LAB — PROCALCITONIN: Procalcitonin: 0.16 ng/mL

## 2021-03-08 LAB — FERRITIN: Ferritin: 755 ng/mL — ABNORMAL HIGH (ref 24–336)

## 2021-03-08 LAB — D-DIMER, QUANTITATIVE: D-Dimer, Quant: 0.6 ug/mL-FEU — ABNORMAL HIGH (ref 0.00–0.50)

## 2021-03-08 LAB — MAGNESIUM: Magnesium: 2.1 mg/dL (ref 1.7–2.4)

## 2021-03-08 LAB — PHOSPHORUS: Phosphorus: 3.5 mg/dL (ref 2.5–4.6)

## 2021-03-08 MED ORDER — SODIUM CHLORIDE 0.9 % IV SOLN: INTRAVENOUS | Status: DC

## 2021-03-08 NOTE — Progress Notes (Signed)
PROGRESS NOTE    Brandon [name]  BTD:[id number] DOB: 1950-12-31 DOA: 03/03/2021 PCP: Kristen Loader, FNP     Brief Narrative:  Brandon [name] a 70 y.o.WM PMHx prior covid pna, hypothyroid, HLD   Presented to ED on 4/8 with complaints of fevers, sob, nonproductive cough. During workup, pt was found to have evidence suggesting RUL pna. Pt was recommended admission, however, pt declined and was ultimately discharged from the ED on augmentin and azithromycin. Blood cultures later returned pos for enterococcus and pt was asked to return to ED. Pt reports generalized weakness, increased cough, decreased exercise tolerance. Also reports fevers - in the ED, pt was found to be tachycardic with tachypnea. WBC noted to be 17.6. Blood cx from 4/8 confirmed enterococcus. ID was consulted and hospitalist requested for admission.  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  As well he is diagnosed with COVID-19 of pneumonia, he is vaccinated, and had previous infection last August.   Subjective: Afebrile overnight, A/O x4, positive SOB but feels as if improved.  Cough has improved   Assessment & Plan: Covid vaccination; vaccinated 3/3   Active Problems:   Sepsis ([place])   Bacteremia   Endocarditis of mitral valve   COVID-19 virus infection   Osler node   Acute respiratory failure with hypoxia ([name])   Pneumonia due to COVID-19 virus   CAP (community acquired pneumonia)   Elevated liver enzymes  Enterococcus bacteremia -Continue current antibiotics -4/15 will curbside ID length of treatment 2 weeks?   Sepsis POA - Presented with fevers, leukocytosis, tachycardia in the setting of Enterococcus bacteremia and endocarditis - ID following,; currently on ampicillin and ceftriaxone  Acute hypoxic respiratory failure due to COVID-19 pneumonia/ CAP pneumonia  COVID-19  Labs  Recent Labs    03/06/21 0057 03/07/21 0251 03/08/21 0037  DDIMER  --   --  0.60*  FERRITIN  --   --  755*  LDH  --   --  158  CRP 22.5* 23.5* 11.0*    Lab Results  Component Value Date   SARSCOV2NAA POSITIVE (A) 03/03/2021   [place] NEGATIVE 03/02/2021   -Remdesivir per pharmacy protocol -Solu-Medrol 60 mg BID -Vitamins per COVID protocol -Incentive spirometry -Flutter valve -Combivent QID -Given his endocarditis, and bacteremia, cannot receive baricitinib or Actemra -Continue current antibiotics which will cover both CAP and endocarditis -4/14 patient's respiratory status appears to be improving.  Endocarditis mitral valve -TTE remarkable for 1 x 1.5 cm vegetation on the mitral valve.  Cardiology consulted for TEE, timing per cardiology  -Continue antibiotics per ID recommendations. -4/13 repeat TEE on 4/14 -CT surgery consulted as well, further recommendation pending TEE and improvement of COVID-19.  Discussed with cardiology, plan for TEE on Thursday at 2 PM. -me an area on his finger that may represent an Osler's node as he has a tender area on his fingertip he also has a possible splinter as seen on picture above. -Need to monitor for further stigmata in particular evidence of septic embolization on antibiotic therapy as this would be a reason for more urgent cardiothoracic surgery. Dr. Roxan Hockey with cardiothoracic surgery.  He does not see an urgent indication for surgery at this point in time. -Decision will be made on treatment options after repeat TEE - 4/14 TEE scheduled for 4/15  Elevated AST/ALT/alk phos  -Appears to be somewhat chronic, mild, trending down-unclear baseline -Downtrending  since admission likely shock liver in the setting of sepsis as well due to Covid. -2018 labs markedly elevated (AST439/ALT760/AlkP230)  -unremarkable work-up at that time; considered for outpatient follow-up for hemochromatosis versus autoimmune work-up but does not  appear to be any documentation in our system that this was ever done -4/14 liver enzymes almost normalized.   Hypothyroid - TSH 2.5 -Synthroid 175 mcg daily  Normocytic anemia - Likely hemodilutional. - 4/14 occult blood pending - No signs or symptoms of occult bleed.     DVT prophylaxis: Lovenox Code Status: Full Family Communication:  Status is: Inpatient    Dispo: The patient is from: Home              Anticipated d/c is to: Home vs SNF              Anticipated d/c date is:??              Patient currently unstable      Consultants:  Dr. Roxan Hockey with cardiothoracic surgery ID Dr. Tommy Medal [facility]iology   Procedures/Significant Events:    I have personally reviewed and interpreted all radiology studies and my findings are as above.  VENTILATOR SETTINGS: Nasal cannula 4/14 Flow 2 L/min SPO2 96%    Cultures   Antimicrobials: Anti-infectives (From admission, onward)   Start     Ordered Stop   03/05/21 1000  remdesivir 100 mg in sodium chloride 0.9 % 100 mL IVPB       "Followed by" Linked Group Details   03/04/21 0035 03/09/21 0959   03/04/21 1000  remdesivir 100 mg in sodium chloride 0.9 % 100 mL IVPB  Status:  Discontinued       "Followed by" Linked Group Details   03/03/21 1806 03/04/21 0035   03/04/21 1000  remdesivir 200 mg in sodium chloride 0.9% 250 mL IVPB       "Followed by" Linked Group Details   03/04/21 0035 03/04/21 1230   03/03/21 2000  cefTRIAXone (ROCEPHIN) 2 g in sodium chloride 0.9 % 100 mL IVPB        03/03/21 1708     03/03/21 2000  remdesivir 200 mg in sodium chloride 0.9% 250 mL IVPB  Status:  Discontinued       "Followed by" Linked Group Details   03/03/21 1806 03/04/21 0035   03/03/21 1600  ampicillin (OMNIPEN) 2 g in sodium chloride 0.9 % 100 mL IVPB        03/03/21 1525         Devices    LINES / TUBES:      Continuous Infusions: . sodium chloride 10 mL/hr at 03/06/21 0000  . sodium chloride Stopped  (03/05/21 0323)  . ampicillin (OMNIPEN) IV 2 g (03/08/21 0738)  . cefTRIAXone (ROCEPHIN)  IV 2 g (03/08/21 0824)  . remdesivir 100 mg in NS 100 mL 100 mg (03/07/21 1015)     Objective: Vitals:   03/08/21 0018 03/08/21 0300 03/08/21 0357 03/08/21 0734  BP: 124/76  123/79 121/77  Pulse: 89  96 97  Resp: '18 16 19 ' (!) 21  Temp: 97.7 F (36.5 C)  97.7 F (36.5 C)   TempSrc: Oral  Oral Oral  SpO2: 97%  98% 96%  Weight:      Height:        Intake/Output Summary (Last 24 hours) at 03/08/2021 0838 Last data filed at 03/08/2021 0600 Gross per 24 hour  Intake 1595.23 ml  Output 350 ml  Net 1245.23 ml  Filed Weights   03/03/21 1835  Weight: 85.3 kg    Examination:  General: A/O x4, positive acute respiratory distress Eyes: negative scleral hemorrhage, negative anisocoria, negative icterus ENT: Negative Runny nose, negative gingival bleeding, Neck:  Negative scars, masses, torticollis, lymphadenopathy, JVD Lungs: Clear to auscultation bilaterally, positive cough with speaking or deep inspiration.   Cardiovascular: Regular rate and rhythm without murmur gallop or rub normal S1 and S2 Abdomen: negative abdominal pain, nondistended, positive soft, bowel sounds, no rebound, no ascites, no appreciable mass Extremities: No significant cyanosis, clubbing, or edema bilateral lower extremities Skin: Negative rashes, lesions, ulcers Psychiatric:  Negative depression, positive anxiety, negative fatigue, negative mania  Central nervous system:  Cranial nerves II through XII intact, tongue/uvula midline, all extremities muscle strength 5/5, sensation intact throughout, negative dysarthria, negative expressive aphasia, negative receptive aphasia.  .     Data Reviewed: Care during the described time interval was provided by me .  I have reviewed this patient's available data, including medical history, events of note, physical examination, and all test results as part of my  evaluation.  CBC: Recent Labs  Lab 03/02/21 1138 03/03/21 1358 03/03/21 1907 03/04/21 0737 03/05/21 0042 03/06/21 0057 03/07/21 0251 03/08/21 0037  WBC 14.1* 17.6*   < > 12.6* 13.7* 16.2* 13.4* 13.0*  NEUTROABS 13.0* 15.4*  --   --   --   --   --  11.7*  HGB 11.7* 12.4*   < > 10.7* 11.1* 10.6* 10.7* 9.9*  HCT 36.2* 38.4*   < > 33.3* 33.7* 32.6* 32.6* 30.0*  MCV 92.6 94.1   < > 94.6 94.7 94.2 95.3 94.0  PLT 314 333   < > 296 325 287 359 402*   < > = values in this interval not displayed.   Basic Metabolic Panel: Recent Labs  Lab 03/04/21 0737 03/05/21 0042 03/06/21 0057 03/07/21 0251 03/08/21 0037  NA 135 131* 132* 136 138  K 3.6 3.5 3.4* 4.3 4.0  CL 103 100 99 102 104  CO2 24 24 21* 26 26  GLUCOSE 95 98 116* 149* 135*  BUN '23 17 15 19 ' 27*  CREATININE 1.06 1.18 0.89 1.01 0.91  CALCIUM 8.1* 8.0* 8.1* 8.5* 8.3*  MG  --   --   --   --  2.1  PHOS  --   --   --   --  3.5   GFR: Estimated Creatinine Clearance: 87.8 mL/min (by C-G formula based on SCr of 0.91 mg/dL). Liver Function Tests: Recent Labs  Lab 03/04/21 0737 03/05/21 0042 03/06/21 0057 03/07/21 0251 03/08/21 0037  AST 23 33 '25 23 26  ' ALT 55* 63* 47* 41 46*  ALKPHOS 166* 181* 159* 138* 122  BILITOT 0.6 1.0 1.3* 0.9 0.5  PROT 6.1* 6.3* 5.9* 6.3* 5.6*  ALBUMIN 2.6* 2.4* 2.1* 2.1* 2.0*   Recent Labs  Lab 03/02/21 1138  LIPASE 27   No results for input(s): AMMONIA in the last 168 hours. Coagulation Profile: Recent Labs  Lab 03/02/21 1138 03/03/21 1515  INR 1.1 1.1   Cardiac Enzymes: Recent Labs  Lab 03/02/21 1138  CKTOTAL 14*   BNP (last 3 results) No results for input(s): PROBNP in the last 8760 hours. HbA1C: No results for input(s): HGBA1C in the last 72 hours. CBG: No results for input(s): GLUCAP in the last 168 hours. Lipid Profile: No results for input(s): CHOL, HDL, LDLCALC, TRIG, CHOLHDL, LDLDIRECT in the last 72 hours. Thyroid Function Tests: No results for input(s): TSH,  T4TOTAL, FREET4, T3FREE, THYROIDAB in the last 72 hours. Anemia Panel: Recent Labs    03/08/21 0037  FERRITIN 755*   Sepsis Labs: Recent Labs  Lab 03/02/21 1138 03/03/21 1358 03/06/21 0057 03/07/21 0251 03/08/21 0037  PROCALCITON  --   --  0.25 0.19 0.16  LATICACIDVEN 1.8 1.1  --   --   --     Recent Results (from the past 240 hour(s))  Culture, blood (routine x 2)     Status: Abnormal   Collection Time: 03/02/21 11:50 AM   Specimen: BLOOD  Result Value Ref Range Status   Specimen Description   Final    BLOOD LEFT ANTECUBITAL Performed at [facility], [address] [place], [place] [address]    Special Requests   Final    BOTTLES DRAWN AEROBIC AND ANAEROBIC Blood Culture adequate volume Performed at [facility], [address] [place], [place] [address]    Culture  Setup Time   Final    GRAM POSITIVE COCCI IN BOTH AEROBIC AND ANAEROBIC BOTTLES CRITICAL VALUE NOTED.  VALUE IS CONSISTENT WITH PREVIOUSLY REPORTED AND CALLED VALUE.    Culture (A)  Final    ENTEROCOCCUS FAECALIS SUSCEPTIBILITIES PERFORMED ON PREVIOUS CULTURE WITHIN THE LAST 5 DAYS. Performed at [facility], [place] [address] [place], [place] [address]    Report Status 03/05/2021 FINAL  Final  Culture, blood (routine x 2)     Status: Abnormal   Collection Time: 03/02/21  1:10 PM   Specimen: Right Antecubital; Blood  Result Value Ref Range Status   Specimen Description   Final    RIGHT ANTECUBITAL BLOOD Performed at [facility], [address] [place], [place] [address]    Special Requests   Final    BOTTLES DRAWN AEROBIC AND ANAEROBIC Blood Culture adequate volume Performed at [facility], [address] [place], [place] [address]    Culture  Setup Time   Final    GRAM POSITIVE COCCI IN CHAINS IN BOTH AEROBIC AND ANAEROBIC BOTTLES CRITICAL RESULT CALLED TO, READ BACK BY AND VERIFIED WITH: S GOUGE RN '@1205'  03/03/21 EB Performed at [facility], [place] [address], [place],  [address]    Culture ENTEROCOCCUS FAECALIS (A)  Final   Report Status 03/05/2021 FINAL  Final   Organism ID, Bacteria ENTEROCOCCUS FAECALIS  Final      Susceptibility   Enterococcus faecalis - MIC*    AMPICILLIN <=2 SENSITIVE Sensitive     VANCOMYCIN 1 SENSITIVE Sensitive     GENTAMICIN SYNERGY SENSITIVE Sensitive     * ENTEROCOCCUS FAECALIS  Blood Culture ID Panel (Reflexed)     Status: Abnormal   Collection Time: 03/02/21  1:10 PM  Result Value Ref Range Status   Enterococcus faecalis DETECTED (A) NOT DETECTED Final    Comment: CRITICAL RESULT CALLED TO, READ BACK BY AND VERIFIED WITH: S GOUGE RN '@1203'  03/03/21 EB    Enterococcus Faecium NOT DETECTED NOT DETECTED Final   Listeria monocytogenes NOT DETECTED NOT DETECTED Final   Staphylococcus species NOT DETECTED NOT DETECTED Final   Staphylococcus aureus (BCID) NOT DETECTED NOT DETECTED Final   Staphylococcus epidermidis NOT DETECTED NOT DETECTED Final   Staphylococcus lugdunensis NOT DETECTED NOT DETECTED Final   Streptococcus species NOT DETECTED NOT DETECTED Final   Streptococcus agalactiae NOT DETECTED NOT DETECTED Final   Streptococcus pneumoniae NOT DETECTED NOT DETECTED Final   Streptococcus pyogenes NOT  DETECTED NOT DETECTED Final   A.calcoaceticus-baumannii NOT DETECTED NOT DETECTED Final   Bacteroides fragilis NOT DETECTED NOT DETECTED Final   Enterobacterales NOT DETECTED NOT DETECTED Final   Enterobacter cloacae complex NOT DETECTED NOT DETECTED Final   Escherichia coli NOT DETECTED NOT DETECTED Final   Klebsiella aerogenes NOT DETECTED NOT DETECTED Final   Klebsiella oxytoca NOT DETECTED NOT DETECTED Final   Klebsiella pneumoniae NOT DETECTED NOT DETECTED Final   Proteus species NOT DETECTED NOT DETECTED Final   Salmonella species NOT DETECTED NOT DETECTED Final   Serratia marcescens NOT DETECTED NOT DETECTED Final   Haemophilus influenzae NOT DETECTED NOT DETECTED  Final   Neisseria meningitidis NOT DETECTED NOT DETECTED Final   Pseudomonas aeruginosa NOT DETECTED NOT DETECTED Final   Stenotrophomonas maltophilia NOT DETECTED NOT DETECTED Final   Candida albicans NOT DETECTED NOT DETECTED Final   Candida auris NOT DETECTED NOT DETECTED Final   Candida glabrata NOT DETECTED NOT DETECTED Final   Candida krusei NOT DETECTED NOT DETECTED Final   Candida parapsilosis NOT DETECTED NOT DETECTED Final   Candida tropicalis NOT DETECTED NOT DETECTED Final   Cryptococcus neoformans/gattii NOT DETECTED NOT DETECTED Final   Vancomycin resistance NOT DETECTED NOT DETECTED Final    Comment: Performed at [facility], [address], [place], [place] [address]  Resp Panel by RT-PCR (Flu A&B, Covid) Nasopharyngeal Swab     Status: None   Collection Time: 03/02/21  3:41 PM   Specimen: Nasopharyngeal Swab; Nasopharyngeal(NP) swabs in vial transport medium  Result Value Ref Range Status   SARS Coronavirus 2 by RT PCR NEGATIVE NEGATIVE Final    Comment: (NOTE) SARS-CoV-2 target nucleic acids are NOT DETECTED.  The SARS-CoV-2 RNA is generally detectable in upper respiratory specimens during the acute phase of infection. The lowest concentration of SARS-CoV-2 viral copies this assay can detect is 138 copies/mL. A negative result does not preclude SARS-Cov-2 infection and should not be used as the sole basis for treatment or other patient management decisions. A negative result may occur with  improper specimen collection/handling, submission of specimen other than nasopharyngeal swab, presence of viral mutation(s) within the areas targeted by this assay, and inadequate number of viral copies(<138 copies/mL). A negative result must be combined with clinical observations, patient history, and epidemiological information. The expected result is Negative.  Fact Sheet for Patients:  EntrepreneurPulse.com.au  Fact Sheet for Healthcare Providers:   IncredibleEmployment.be  This test is no t yet approved or cleared by the [place] FDA and  has been authorized for detection and/or diagnosis of SARS-CoV-2 by FDA under an Emergency Use Authorization (EUA). This EUA will remain  in effect (meaning this test can be used) for the duration of the COVID-19 declaration under Section 564(b)(1) of the Act, 21 U.S.C.section 360bbb-3(b)(1), unless the authorization is terminated  or revoked sooner.       Influenza A by PCR NEGATIVE NEGATIVE Final   Influenza B by PCR NEGATIVE NEGATIVE Final    Comment: (NOTE) The Xpert Xpress SARS-CoV-2/FLU/RSV plus assay is intended as an aid in the diagnosis of influenza from Nasopharyngeal swab specimens and should not be used as a sole basis for treatment. Nasal washings and aspirates are unacceptable for Xpert Xpress SARS-CoV-2/FLU/RSV testing.  Fact Sheet for Patients: EntrepreneurPulse.com.au  Fact Sheet for Healthcare Providers: IncredibleEmployment.be  This test is not yet approved or cleared by the [place] FDA and has been authorized for detection and/or diagnosis of SARS-CoV-2 by FDA under an Emergency  Use Authorization (EUA). This EUA will remain in effect (meaning this test can be used) for the duration of the COVID-19 declaration under Section 564(b)(1) of the Act, 21 U.S.C. section 360bbb-3(b)(1), unless the authorization is terminated or revoked.  Performed at [facility], [address] [place], [place] [address]   Blood culture (routine x 2)     Status: None   Collection Time: 03/03/21  1:58 PM   Specimen: BLOOD  Result Value Ref Range Status   Specimen Description   Final    BLOOD LEFT ANTECUBITAL Performed at [facility] [address], [place], [place] [address]    Special Requests   Final    BOTTLES DRAWN AEROBIC AND ANAEROBIC Blood Culture adequate volume Performed at  [facility] [address], [place], [place] [address]    Culture   Final    NO GROWTH 5 DAYS Performed at [facility], [place] [address], [place], [place] [address]    Report Status 03/08/2021 FINAL  Final  Resp Panel by RT-PCR (Flu A&B, Covid) Nasopharyngeal Swab     Status: Abnormal   Collection Time: 03/03/21  3:08 PM   Specimen: Nasopharyngeal Swab; Nasopharyngeal(NP) swabs in vial transport medium  Result Value Ref Range Status   SARS Coronavirus 2 by RT PCR POSITIVE (A) NEGATIVE Final    Comment: RESULT CALLED TO, READ BACK BY AND VERIFIED WITH: BOWEN,M. RN AT 1628 03/03/21 MULLINS,T (NOTE) SARS-CoV-2 target nucleic acids are DETECTED.  The SARS-CoV-2 RNA is generally detectable in upper respiratory specimens during the acute phase of infection. Positive results are indicative of the presence of the identified virus, but do not rule out bacterial infection or co-infection with other pathogens not detected by the test. Clinical correlation with patient history and other diagnostic information is necessary to determine patient infection status. The expected result is Negative.  Fact Sheet for Patients: EntrepreneurPulse.com.au  Fact Sheet for Healthcare Providers: IncredibleEmployment.be  This test is not yet approved or cleared by the [place] FDA and  has been authorized for detection and/or diagnosis of SARS-CoV-2 by FDA under an Emergency Use Authorization (EUA).  This EUA will remain in effect (meaning this test c an be used) for the duration of  the COVID-19 declaration under Section 564(b)(1) of the Act, 21 U.S.C. section 360bbb-3(b)(1), unless the authorization is terminated or revoked sooner.     Influenza A by PCR NEGATIVE NEGATIVE Final   Influenza B by PCR NEGATIVE NEGATIVE Final    Comment: (NOTE) The Xpert Xpress SARS-CoV-2/FLU/RSV plus assay is intended as an aid in the diagnosis of  influenza from Nasopharyngeal swab specimens and should not be used as a sole basis for treatment. Nasal washings and aspirates are unacceptable for Xpert Xpress SARS-CoV-2/FLU/RSV testing.  Fact Sheet for Patients: EntrepreneurPulse.com.au  Fact Sheet for Healthcare Providers: IncredibleEmployment.be  This test is not yet approved or cleared by the [place] FDA and has been authorized for detection and/or diagnosis of SARS-CoV-2 by FDA under an Emergency Use Authorization (EUA). This EUA will remain in effect (meaning this test can be used) for the duration of the COVID-19 declaration under Section 564(b)(1) of the Act, 21 U.S.C. section 360bbb-3(b)(1), unless the authorization is terminated or revoked.  Performed at [facility], [place] [address] [place],  [address]   Urine culture     Status: None   Collection Time: 03/03/21  3:08 PM   Specimen: In/Out Cath Urine  Result Value Ref Range Status   Specimen Description   Final    IN/OUT CATH URINE Performed at [facility] [address], [place], [place] [address]    Special Requests   Final    NONE Performed at [facility], [place] [address] [place], [place] [address]    Culture   Final    NO GROWTH Performed at [facility], [place] [address] [place], [place] [address]    Report Status 03/04/2021 FINAL  Final  Blood culture (routine x 2)     Status: None   Collection Time: 03/03/21  3:41 PM   Specimen: BLOOD  Result Value Ref Range Status   Specimen Description   Final    BLOOD RIGHT ANTECUBITAL Performed at [facility] [address], [place], [place] [address]    Special Requests   Final    BOTTLES DRAWN AEROBIC AND ANAEROBIC Blood Culture adequate volume Performed at [facility] [address], [place], [place] [address]    Culture   Final    NO GROWTH 5  DAYS Performed at [facility], [place] [address] [place], [place] [address]    Report Status 03/08/2021 FINAL  Final  MRSA PCR Screening     Status: Abnormal   Collection Time: 03/04/21 11:53 PM   Specimen: Nasopharyngeal  Result Value Ref Range Status   MRSA by PCR POSITIVE (A) NEGATIVE Final    Comment:        The GeneXpert MRSA Assay (FDA approved for NASAL specimens only), is one component of a comprehensive MRSA colonization surveillance program. It is not intended to diagnose MRSA infection nor to guide or monitor treatment for MRSA infections. RESULT CALLED TO, READ BACK BY AND VERIFIED WITH: [facility] RN AT 0121 03/05/2021 MITCHELL,L Performed at [facility], [place] [address], [place], [place] [address]          Radiology Studies: DG Chest Port 1 View  Result Date: 03/08/2021 CLINICAL DATA:  Shortness of breath.  Recent COVID-19 positive EXAM: PORTABLE CHEST 1 VIEW COMPARISON:  March 06, 2021 chest radiograph; chest radiograph and chest CT March 02, 2021 FINDINGS: There remains airspace opacity throughout the right upper lobe with volume loss. Airspace opacity throughout much of the left upper lobe as well as in the left lower lung region and to a lesser extent in the right base remain. Heart is borderline enlarged with pulmonary vascular normal. No adenopathy. Postoperative change left clavicle. Evidence of previous thyroidectomy. IMPRESSION: Multifocal airspace opacity, most pronounced in the right upper lobe. There is slight increase in right upper lobe consolidation and volume loss compared to most recent study. Ill-defined airspace opacity elsewhere unchanged. Suspect multifocal pneumonia, potentially of atypical organism etiology. Stable cardiac silhouette. Electronically Signed   By: Lowella Grip III M.D.   On: 03/08/2021 07:56        Scheduled Meds: . vitamin C  500 mg Oral Daily  . Chlorhexidine Gluconate Cloth  6 each Topical Q0600  .  cholecalciferol  1,000 Units Oral Daily  . enoxaparin (LOVENOX) injection  40 mg Subcutaneous Q24H  . feeding supplement  237 mL Oral BID BM  . furosemide  40 mg Oral Daily  . Ipratropium-Albuterol  1 puff Inhalation Q6H  . levothyroxine  175 mcg Oral QAC breakfast  . mouth rinse  15 mL Mouth Rinse BID  . methylPREDNISolone (SOLU-MEDROL) injection  60 mg Intravenous Q12H  . multivitamin  with minerals  1 tablet Oral Daily  . mupirocin ointment  1 application Nasal BID  . pantoprazole  40 mg Oral Daily  . traZODone  50 mg Oral QHS  . zinc sulfate  220 mg Oral Daily   Continuous Infusions: . sodium chloride 10 mL/hr at 03/06/21 0000  . sodium chloride Stopped (03/05/21 0323)  . ampicillin (OMNIPEN) IV 2 g (03/08/21 0738)  . cefTRIAXone (ROCEPHIN)  IV 2 g (03/08/21 0824)  . remdesivir 100 mg in NS 100 mL 100 mg (03/07/21 1015)     LOS: 5 days    Time spent:40 min    Airiana Elman, Geraldo Docker, MD [facility]   If 7PM-7AM, please contact night-coverage 03/08/2021, 8:38 AM

## 2021-03-08 NOTE — Plan of Care (Signed)

## 2021-03-08 NOTE — Progress Notes (Signed)
Subjective:  He continues to feel better like to have some water because of his dry throat  Antibiotics:  Anti-infectives (From admission, onward)   Start     Dose/Rate Route Frequency Ordered Stop   03/05/21 1000  remdesivir 100 mg in sodium chloride 0.9 % 100 mL IVPB       "Followed by" Linked Group Details   100 mg 200 mL/hr over 30 Minutes Intravenous Daily 03/04/21 0035 03/08/21 1024   03/04/21 1000  remdesivir 100 mg in sodium chloride 0.9 % 100 mL IVPB  Status:  Discontinued       "Followed by" Linked Group Details   100 mg 200 mL/hr over 30 Minutes Intravenous Daily 03/03/21 1806 03/04/21 0035   03/04/21 1000  remdesivir 200 mg in sodium chloride 0.9% 250 mL IVPB       "Followed by" Linked Group Details   200 mg 580 mL/hr over 30 Minutes Intravenous Once 03/04/21 0035 03/04/21 1230   03/03/21 2000  cefTRIAXone (ROCEPHIN) 2 g in sodium chloride 0.9 % 100 mL IVPB        2 g 200 mL/hr over 30 Minutes Intravenous Every 12 hours 03/03/21 1708     03/03/21 2000  remdesivir 200 mg in sodium chloride 0.9% 250 mL IVPB  Status:  Discontinued       "Followed by" Linked Group Details   200 mg 580 mL/hr over 30 Minutes Intravenous Once 03/03/21 1806 03/04/21 0035   03/03/21 1600  ampicillin (OMNIPEN) 2 g in sodium chloride 0.9 % 100 mL IVPB        2 g 300 mL/hr over 20 Minutes Intravenous Every 4 hours 03/03/21 1525        Medications: Scheduled Meds: . vitamin C  500 mg Oral Daily  . Chlorhexidine Gluconate Cloth  6 each Topical Q0600  . cholecalciferol  1,000 Units Oral Daily  . enoxaparin (LOVENOX) injection  40 mg Subcutaneous Q24H  . feeding supplement  237 mL Oral BID BM  . furosemide  40 mg Oral Daily  . Ipratropium-Albuterol  1 puff Inhalation Q6H  . levothyroxine  175 mcg Oral QAC breakfast  . mouth rinse  15 mL Mouth Rinse BID  . methylPREDNISolone (SOLU-MEDROL) injection  60 mg Intravenous Q12H  . multivitamin with minerals  1 tablet Oral Daily  .  mupirocin ointment  1 application Nasal BID  . pantoprazole  40 mg Oral Daily  . traZODone  50 mg Oral QHS  . zinc sulfate  220 mg Oral Daily   Continuous Infusions: . sodium chloride 10 mL/hr at 03/06/21 0000  . sodium chloride Stopped (03/05/21 0323)  . ampicillin (OMNIPEN) IV 2 g (03/08/21 0738)  . cefTRIAXone (ROCEPHIN)  IV 2 g (03/08/21 0824)   PRN Meds:.sodium chloride, sodium chloride, acetaminophen **OR** acetaminophen, albuterol, chlorpheniramine-HYDROcodone, fluticasone, guaiFENesin-dextromethorphan, hydrALAZINE    Objective: Weight change:   Intake/Output Summary (Last 24 hours) at 03/08/2021 1135 Last data filed at 03/08/2021 0600 Gross per 24 hour  Intake 1359.23 ml  Output 350 ml  Net 1009.23 ml   Blood pressure 121/77, pulse 97, temperature 97.7 F (36.5 C), temperature source Oral, resp. rate (!) 21, height 6\' 1"  (1.854 m), weight 85.3 kg, SpO2 96 %. Temp:  [97.6 F (36.4 C)-97.7 F (36.5 C)] 97.7 F (36.5 C) (04/14 0357) Pulse Rate:  [89-97] 97 (04/14 0734) Resp:  [16-22] 21 (04/14 0734) BP: (121-135)/(67-87) 121/77 (04/14 0734) SpO2:  [94 %-98 %] 96 % (04/14  4854)  Physical Exam: Physical Exam Constitutional:      Appearance: He is well-developed.  HENT:     Head: Normocephalic and atraumatic.  Eyes:     Conjunctiva/sclera: Conjunctivae normal.  Cardiovascular:     Rate and Rhythm: Normal rate and regular rhythm.     Heart sounds: Murmur heard.    Pulmonary:     Effort: Pulmonary effort is normal. No respiratory distress.     Breath sounds: No stridor. No wheezing or rhonchi.  Abdominal:     General: There is no distension.     Palpations: Abdomen is soft. There is no mass.  Musculoskeletal:        General: Normal range of motion.     Cervical back: Normal range of motion and neck supple.  Skin:    General: Skin is warm and dry.     Findings: No erythema or rash.  Neurological:     General: No focal deficit present.     Mental Status: He  is alert and oriented to person, place, and time.  Psychiatric:        Mood and Affect: Mood normal.        Behavior: Behavior normal.        Thought Content: Thought content normal.        Judgment: Judgment normal.     Possible Osler's and splinter hemorrhages are stable today versus yesterday [place]  seems to be getting better Left hand see pictures for 11/13/2021:      Left hand pictures  March 07, 2021:        Right hand March 07, 2021: Note he states that the possible splinter there was present for several days at least if not longer.         CBC:    BMET Recent Labs    03/07/21 0251 03/08/21 0037  NA 136 138  K 4.3 4.0  CL 102 104  CO2 26 26  GLUCOSE 149* 135*  BUN 19 27*  CREATININE 1.01 0.91  CALCIUM 8.5* 8.3*     Liver Panel  Recent Labs    03/07/21 0251 03/08/21 0037  PROT 6.3* 5.6*  ALBUMIN 2.1* 2.0*  AST 23 26  ALT 41 46*  ALKPHOS 138* 122  BILITOT 0.9 0.5       Sedimentation Rate No results for input(s): ESRSEDRATE in the last 72 hours. C-Reactive Protein Recent Labs    03/07/21 0251 03/08/21 0037  CRP 23.5* 11.0*    Micro Results: Recent Results (from the past 720 hour(s))  Culture, blood (routine x 2)     Status: Abnormal   Collection Time: 03/02/21 11:50 AM   Specimen: BLOOD  Result Value Ref Range Status   Specimen Description   Final    BLOOD LEFT ANTECUBITAL Performed at [facility], [address] [place], [place] [address]    Special Requests   Final    BOTTLES DRAWN AEROBIC AND ANAEROBIC Blood Culture adequate volume Performed at [facility], [address] [place], [place] [address]    Culture  Setup Time   Final    GRAM POSITIVE COCCI IN BOTH AEROBIC AND ANAEROBIC BOTTLES CRITICAL VALUE NOTED.  VALUE IS CONSISTENT WITH PREVIOUSLY REPORTED AND CALLED VALUE.    Culture (A)  Final    ENTEROCOCCUS FAECALIS SUSCEPTIBILITIES PERFORMED ON PREVIOUS CULTURE WITHIN THE LAST 5  DAYS. Performed at [facility], [address] [place], [place] [address]  Report Status 03/05/2021 FINAL  Final  Culture, blood (routine x 2)     Status: Abnormal   Collection Time: 03/02/21  1:10 PM   Specimen: Right Antecubital; Blood  Result Value Ref Range Status   Specimen Description   Final    RIGHT ANTECUBITAL BLOOD Performed at [facility], [address] [place], [place] [address]    Special Requests   Final    BOTTLES DRAWN AEROBIC AND ANAEROBIC Blood Culture adequate volume Performed at [facility], [address] [place], [place] [address]    Culture  Setup Time   Final    GRAM POSITIVE COCCI IN CHAINS IN BOTH AEROBIC AND ANAEROBIC BOTTLES CRITICAL RESULT CALLED TO, READ BACK BY AND VERIFIED WITH: S GOUGE RN @1205  03/03/21 EB Performed at [facility], [address] [place], [place] [address]    Culture ENTEROCOCCUS FAECALIS (A)  Final   Report Status 03/05/2021 FINAL  Final   Organism ID, Bacteria ENTEROCOCCUS FAECALIS  Final      Susceptibility   Enterococcus faecalis - MIC*    AMPICILLIN <=2 SENSITIVE Sensitive     VANCOMYCIN 1 SENSITIVE Sensitive     GENTAMICIN SYNERGY SENSITIVE Sensitive     * ENTEROCOCCUS FAECALIS  Blood Culture ID Panel (Reflexed)     Status: Abnormal   Collection Time: 03/02/21  1:10 PM  Result Value Ref Range Status   Enterococcus faecalis DETECTED (A) NOT DETECTED Final    Comment: CRITICAL RESULT CALLED TO, READ BACK BY AND VERIFIED WITH: S GOUGE RN @1203  03/03/21 EB    Enterococcus Faecium NOT DETECTED NOT DETECTED Final   Listeria monocytogenes NOT DETECTED NOT DETECTED Final   Staphylococcus species NOT DETECTED NOT DETECTED Final   Staphylococcus aureus (BCID) NOT DETECTED NOT DETECTED Final   Staphylococcus epidermidis NOT DETECTED NOT DETECTED Final   Staphylococcus lugdunensis NOT DETECTED NOT DETECTED Final   Streptococcus species NOT DETECTED NOT DETECTED Final   Streptococcus  agalactiae NOT DETECTED NOT DETECTED Final   Streptococcus pneumoniae NOT DETECTED NOT DETECTED Final   Streptococcus pyogenes NOT DETECTED NOT DETECTED Final   A.calcoaceticus-baumannii NOT DETECTED NOT DETECTED Final   Bacteroides fragilis NOT DETECTED NOT DETECTED Final   Enterobacterales NOT DETECTED NOT DETECTED Final   Enterobacter cloacae complex NOT DETECTED NOT DETECTED Final   Escherichia coli NOT DETECTED NOT DETECTED Final   Klebsiella aerogenes NOT DETECTED NOT DETECTED Final   Klebsiella oxytoca NOT DETECTED NOT DETECTED Final   Klebsiella pneumoniae NOT DETECTED NOT DETECTED Final   Proteus species NOT DETECTED NOT DETECTED Final   Salmonella species NOT DETECTED NOT DETECTED Final   Serratia marcescens NOT DETECTED NOT DETECTED Final   Haemophilus influenzae NOT DETECTED NOT DETECTED Final   Neisseria meningitidis NOT DETECTED NOT DETECTED Final   Pseudomonas aeruginosa NOT DETECTED NOT DETECTED Final   Stenotrophomonas maltophilia NOT DETECTED NOT DETECTED Final   Candida albicans NOT DETECTED NOT DETECTED Final   Candida auris NOT DETECTED NOT DETECTED Final   Candida glabrata NOT DETECTED NOT DETECTED Final   Candida krusei NOT DETECTED NOT DETECTED Final   Candida parapsilosis NOT DETECTED NOT DETECTED Final   Candida tropicalis NOT DETECTED NOT DETECTED Final   Cryptococcus neoformans/gattii NOT DETECTED NOT DETECTED Final   Vancomycin resistance NOT DETECTED NOT DETECTED Final    Comment: Performed at [facility], [address] [place], [place] [address]  Resp Panel by RT-PCR (Flu A&B, Covid) Nasopharyngeal  Swab     Status: None   Collection Time: 03/02/21  3:41 PM   Specimen: Nasopharyngeal Swab; Nasopharyngeal(NP) swabs in vial transport medium  Result Value Ref Range Status   SARS Coronavirus 2 by RT PCR NEGATIVE NEGATIVE Final    Comment: (NOTE) SARS-CoV-2 target nucleic acids are NOT DETECTED.  The SARS-CoV-2 RNA is generally detectable in upper  respiratory specimens during the acute phase of infection. The lowest concentration of SARS-CoV-2 viral copies this assay can detect is 138 copies/mL. A negative result does not preclude SARS-Cov-2 infection and should not be used as the sole basis for treatment or other patient management decisions. A negative result may occur with  improper specimen collection/handling, submission of specimen other than nasopharyngeal swab, presence of viral mutation(s) within the areas targeted by this assay, and inadequate number of viral copies(<138 copies/mL). A negative result must be combined with clinical observations, patient history, and epidemiological information. The expected result is Negative.  Fact Sheet for Patients:  BloggerCourse.com  Fact Sheet for Healthcare Providers:  SeriousBroker.it  This test is no t yet approved or cleared by the [place] FDA and  has been authorized for detection and/or diagnosis of SARS-CoV-2 by FDA under an Emergency Use Authorization (EUA). This EUA will remain  in effect (meaning this test can be used) for the duration of the COVID-19 declaration under Section 564(b)(1) of the Act, 21 U.S.C.section 360bbb-3(b)(1), unless the authorization is terminated  or revoked sooner.       Influenza A by PCR NEGATIVE NEGATIVE Final   Influenza B by PCR NEGATIVE NEGATIVE Final    Comment: (NOTE) The Xpert Xpress SARS-CoV-2/FLU/RSV plus assay is intended as an aid in the diagnosis of influenza from Nasopharyngeal swab specimens and should not be used as a sole basis for treatment. Nasal washings and aspirates are unacceptable for Xpert Xpress SARS-CoV-2/FLU/RSV testing.  Fact Sheet for Patients: BloggerCourse.com  Fact Sheet for Healthcare Providers: SeriousBroker.it  This test is not yet approved or cleared by the [place] FDA and has been  authorized for detection and/or diagnosis of SARS-CoV-2 by FDA under an Emergency Use Authorization (EUA). This EUA will remain in effect (meaning this test can be used) for the duration of the COVID-19 declaration under Section 564(b)(1) of the Act, 21 U.S.C. section 360bbb-3(b)(1), unless the authorization is terminated or revoked.  Performed at [facility], [address] [place], [place] [address]   Blood culture (routine x 2)     Status: None   Collection Time: 03/03/21  1:58 PM   Specimen: BLOOD  Result Value Ref Range Status   Specimen Description   Final    BLOOD LEFT ANTECUBITAL Performed at [facility], [address] [place], [place] [address]    Special Requests   Final    BOTTLES DRAWN AEROBIC AND ANAEROBIC Blood Culture adequate volume Performed at [facility], [address] [place], [place] [address]    Culture   Final    NO GROWTH 5 DAYS Performed at [facility], [address] [place], [place] [address]    Report Status 03/08/2021 FINAL  Final  Resp Panel by RT-PCR (Flu A&B, Covid) Nasopharyngeal Swab     Status: Abnormal   Collection Time: 03/03/21  3:08 PM   Specimen: Nasopharyngeal Swab; Nasopharyngeal(NP) swabs in vial transport medium  Result Value Ref Range Status   SARS Coronavirus 2 by RT PCR POSITIVE (A) NEGATIVE Final  Comment: RESULT CALLED TO, READ BACK BY AND VERIFIED WITH: BOWEN,M. RN AT 1628 03/03/21 MULLINS,T (NOTE) SARS-CoV-2 target nucleic acids are DETECTED.  The SARS-CoV-2 RNA is generally detectable in upper respiratory specimens during the acute phase of infection. Positive results are indicative of the presence of the identified virus, but do not rule out bacterial infection or co-infection with other pathogens not detected by the test. Clinical correlation with patient history and other diagnostic information is necessary to determine patient infection status. The  expected result is Negative.  Fact Sheet for Patients: BloggerCourse.com  Fact Sheet for Healthcare Providers: SeriousBroker.it  This test is not yet approved or cleared by the [place] FDA and  has been authorized for detection and/or diagnosis of SARS-CoV-2 by FDA under an Emergency Use Authorization (EUA).  This EUA will remain in effect (meaning this test c an be used) for the duration of  the COVID-19 declaration under Section 564(b)(1) of the Act, 21 U.S.C. section 360bbb-3(b)(1), unless the authorization is terminated or revoked sooner.     Influenza A by PCR NEGATIVE NEGATIVE Final   Influenza B by PCR NEGATIVE NEGATIVE Final    Comment: (NOTE) The Xpert Xpress SARS-CoV-2/FLU/RSV plus assay is intended as an aid in the diagnosis of influenza from Nasopharyngeal swab specimens and should not be used as a sole basis for treatment. Nasal washings and aspirates are unacceptable for Xpert Xpress SARS-CoV-2/FLU/RSV testing.  Fact Sheet for Patients: BloggerCourse.com  Fact Sheet for Healthcare Providers: SeriousBroker.it  This test is not yet approved or cleared by the [place] FDA and has been authorized for detection and/or diagnosis of SARS-CoV-2 by FDA under an Emergency Use Authorization (EUA). This EUA will remain in effect (meaning this test can be used) for the duration of the COVID-19 declaration under Section 564(b)(1) of the Act, 21 U.S.C. section 360bbb-3(b)(1), unless the authorization is terminated or revoked.  Performed at [facility], [address] [place], [place] [address]   Urine culture     Status: None   Collection Time: 03/03/21  3:08 PM   Specimen: In/Out Cath Urine  Result Value Ref Range Status   Specimen Description   Final    IN/OUT CATH URINE Performed at [facility], [address],  [place], [place] [address]    Special Requests   Final    NONE Performed at [facility], [address] [place], [place] [address]    Culture   Final    NO GROWTH Performed at [facility], [address] [place], [place] [address]    Report Status 03/04/2021 FINAL  Final  Blood culture (routine x 2)     Status: None   Collection Time: 03/03/21  3:41 PM   Specimen: BLOOD  Result Value Ref Range Status   Specimen Description   Final    BLOOD RIGHT ANTECUBITAL Performed at [facility], [address], [place], [place] [address]    Special Requests   Final    BOTTLES DRAWN AEROBIC AND ANAEROBIC Blood Culture adequate volume Performed at [facility], [address] [place], [place] [address]    Culture   Final    NO GROWTH 5 DAYS Performed at [facility], [address], [place], [place] [address]    Report Status 03/08/2021 FINAL  Final  MRSA PCR Screening     Status: Abnormal   Collection Time: 03/04/21 11:53 PM   Specimen: Nasopharyngeal  Result Value Ref Range Status   MRSA by PCR POSITIVE (A) NEGATIVE Final    Comment:        The GeneXpert MRSA Assay (FDA approved for NASAL specimens only), is one component of a comprehensive MRSA colonization surveillance program. It is not intended to diagnose MRSA infection nor to guide or monitor treatment for MRSA infections. RESULT CALLED TO, READ BACK BY AND VERIFIED WITH: [facility] RN AT 0121 03/05/2021 MITCHELL,L Performed at [facility], [address] [place], [place] [address]     Studies/Results: DG Chest Port 1 View  Result Date: 03/08/2021 CLINICAL DATA:  Shortness of breath.  Recent COVID-19 positive EXAM: PORTABLE CHEST 1 VIEW COMPARISON:  March 06, 2021 chest radiograph; chest radiograph and chest CT March 02, 2021 FINDINGS: There remains airspace opacity throughout the right upper lobe with volume loss. Airspace opacity throughout much of  the left upper lobe as well as in the left lower lung region and to a lesser extent in the right base remain. Heart is borderline enlarged with pulmonary vascular normal. No adenopathy. Postoperative change left clavicle. Evidence of previous thyroidectomy. IMPRESSION: Multifocal airspace opacity, most pronounced in the right upper lobe. There is slight increase in right upper lobe consolidation and volume loss compared to most recent study. Ill-defined airspace opacity elsewhere unchanged. Suspect multifocal pneumonia, potentially of atypical organism etiology. Stable cardiac silhouette. Electronically Signed   By: Bretta Bang III M.D.   On: 03/08/2021 07:56      Assessment/Plan:  INTERVAL HISTORY: Patient found to have progression of his pneumonia radiographically  Active Problems:   Sepsis (HCC)   Bacteremia   Endocarditis of mitral valve   COVID-19 virus infection   Osler node   Acute respiratory failure with hypoxia (HCC)   Pneumonia due to COVID-19 virus   CAP (community acquired pneumonia)   Elevated liver enzymes    Brandon [name] is a 70 y.o. male with prior history of COVID-19 infection smoldering symptoms with hindsight likely due to subacute bacterial endocarditis which she has now been proven to have on 2D echocardiogram with Enterococcus faecalis growing in blood.  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  Patient has been seen by Dr. Dorris Fetch with cardiothoracic surgery.  =  Patient for transesophageal echocardiogram.   Continue ceftriaxone and  ampicillin   #2 COVID 19: Second infection with this virus.  He is on remdesivir and steroids.  #3 Pneumonia: Suspect the pneumonia is largely being driven by COVID-19 no reason to broaden antibiotics at this time.  Does clinically seem to be better with less oxygen requirements and symptomatic improvement    LOS: 5 days   Acey Lav 03/08/2021, 11:35 AM

## 2021-03-08 NOTE — Progress Notes (Signed)
    [facility] has been requested to perform a transesophageal echocardiogram on Mr. Brandon [name] for bacteremia and endocarditis.  After careful review of history and examination, the risks and benefits of transesophageal echocardiogram have been explained including risks of esophageal damage, perforation (1:10,000 risk), bleeding, pharyngeal hematoma as well as other potential complications associated with conscious sedation including aspiration, arrhythmia, respiratory failure and death. Alternatives to treatment were discussed, questions were answered. Patient is willing to proceed.  NPO after midnight. Meds with sips.   Manson Passey, PA-C 03/08/2021 3:48 PM

## 2021-03-08 NOTE — Plan of Care (Signed)
  Problem: Education: Goal: Knowledge of General Education information will improve Description: Including pain rating scale, medication(s)/side effects and non-pharmacologic comfort measures Outcome: Progressing   Problem: Health Behavior/Discharge Planning: Goal: Ability to manage health-related needs will improve Outcome: Progressing   Problem: Clinical Measurements: Goal: Ability to maintain clinical measurements within normal limits will improve Outcome: Progressing Goal: Will remain free from infection Outcome: Progressing Goal: Diagnostic test results will improve Outcome: Progressing Goal: Respiratory complications will improve Outcome: Progressing Goal: Cardiovascular complication will be avoided Outcome: Progressing   Problem: Elimination: Goal: Will not experience complications related to bowel motility Outcome: Progressing Goal: Will not experience complications related to urinary retention Outcome: Progressing   Problem: Coping: Goal: Level of anxiety will decrease Outcome: Progressing   Problem: Pain Managment: Goal: General experience of comfort will improve Outcome: Progressing   Problem: Safety: Goal: Ability to remain free from injury will improve Outcome: Progressing   

## 2021-03-09 ENCOUNTER — Other Ambulatory Visit (HOSPITAL_COMMUNITY): Payer: Medicare Other

## 2021-03-09 DIAGNOSIS — I330 Acute and subacute infective endocarditis: Secondary | ICD-10-CM | POA: Diagnosis not present

## 2021-03-09 DIAGNOSIS — A419 Sepsis, unspecified organism: Secondary | ICD-10-CM | POA: Diagnosis not present

## 2021-03-09 DIAGNOSIS — R5383 Other fatigue: Secondary | ICD-10-CM

## 2021-03-09 DIAGNOSIS — I340 Nonrheumatic mitral (valve) insufficiency: Secondary | ICD-10-CM

## 2021-03-09 DIAGNOSIS — I059 Rheumatic mitral valve disease, unspecified: Secondary | ICD-10-CM | POA: Diagnosis not present

## 2021-03-09 DIAGNOSIS — R748 Abnormal levels of other serum enzymes: Secondary | ICD-10-CM

## 2021-03-09 DIAGNOSIS — R600 Localized edema: Secondary | ICD-10-CM

## 2021-03-09 DIAGNOSIS — B952 Enterococcus as the cause of diseases classified elsewhere: Secondary | ICD-10-CM | POA: Diagnosis not present

## 2021-03-09 DIAGNOSIS — R7881 Bacteremia: Secondary | ICD-10-CM | POA: Diagnosis not present

## 2021-03-09 DIAGNOSIS — I339 Acute and subacute endocarditis, unspecified: Secondary | ICD-10-CM

## 2021-03-09 DIAGNOSIS — U071 COVID-19: Secondary | ICD-10-CM | POA: Diagnosis not present

## 2021-03-09 LAB — CBC WITH DIFFERENTIAL/PLATELET
Abs Immature Granulocytes: 0.13 10*3/uL — ABNORMAL HIGH (ref 0.00–0.07)
Basophils Absolute: 0 10*3/uL (ref 0.0–0.1)
Basophils Relative: 0 %
Eosinophils Absolute: 0 10*3/uL (ref 0.0–0.5)
Eosinophils Relative: 0 %
HCT: 32 % — ABNORMAL LOW (ref 39.0–52.0)
Hemoglobin: 10.1 g/dL — ABNORMAL LOW (ref 13.0–17.0)
Immature Granulocytes: 1 %
Lymphocytes Relative: 7 %
Lymphs Abs: 0.7 10*3/uL (ref 0.7–4.0)
MCH: 30 pg (ref 26.0–34.0)
MCHC: 31.6 g/dL (ref 30.0–36.0)
MCV: 95 fL (ref 80.0–100.0)
Monocytes Absolute: 0.3 10*3/uL (ref 0.1–1.0)
Monocytes Relative: 3 %
Neutro Abs: 9.8 10*3/uL — ABNORMAL HIGH (ref 1.7–7.7)
Neutrophils Relative %: 89 %
Platelets: 450 10*3/uL — ABNORMAL HIGH (ref 150–400)
RBC: 3.37 MIL/uL — ABNORMAL LOW (ref 4.22–5.81)
RDW: 13.7 % (ref 11.5–15.5)
WBC: 11 10*3/uL — ABNORMAL HIGH (ref 4.0–10.5)
nRBC: 0 % (ref 0.0–0.2)

## 2021-03-09 LAB — COMPREHENSIVE METABOLIC PANEL
ALT: 50 U/L — ABNORMAL HIGH (ref 0–44)
AST: 28 U/L (ref 15–41)
Albumin: 2.2 g/dL — ABNORMAL LOW (ref 3.5–5.0)
Alkaline Phosphatase: 110 U/L (ref 38–126)
Anion gap: 11 (ref 5–15)
BUN: 32 mg/dL — ABNORMAL HIGH (ref 8–23)
CO2: 25 mmol/L (ref 22–32)
Calcium: 8.2 mg/dL — ABNORMAL LOW (ref 8.9–10.3)
Chloride: 102 mmol/L (ref 98–111)
Creatinine, Ser: 0.97 mg/dL (ref 0.61–1.24)
GFR, Estimated: >60 mL/min (ref >60)
Glucose, Bld: 142 mg/dL — ABNORMAL HIGH (ref 70–99)
Potassium: 3.7 mmol/L (ref 3.5–5.1)
Sodium: 138 mmol/L (ref 135–145)
Total Bilirubin: 0.5 mg/dL (ref 0.3–1.2)
Total Protein: 5.6 g/dL — ABNORMAL LOW (ref 6.5–8.1)

## 2021-03-09 LAB — LACTATE DEHYDROGENASE: LDH: 157 U/L (ref 98–192)

## 2021-03-09 LAB — PHOSPHORUS: Phosphorus: 3.3 mg/dL (ref 2.5–4.6)

## 2021-03-09 LAB — FERRITIN: Ferritin: 627 ng/mL — ABNORMAL HIGH (ref 24–336)

## 2021-03-09 LAB — D-DIMER, QUANTITATIVE: D-Dimer, Quant: 0.63 ug/mL-FEU — ABNORMAL HIGH (ref 0.00–0.50)

## 2021-03-09 LAB — MAGNESIUM: Magnesium: 2 mg/dL (ref 1.7–2.4)

## 2021-03-09 LAB — C-REACTIVE PROTEIN: CRP: 6.3 mg/dL — ABNORMAL HIGH (ref <1.0)

## 2021-03-09 NOTE — Progress Notes (Signed)
PROGRESS NOTE    Brandon RUEB  [name]:[id number] DOB: 1951/09/18 DOA: 03/03/2021 PCP: Brandon Loader, FNP     Brief Narrative:  Brandon [name] a 70 y.o.WM PMHx prior covid pna, hypothyroid, HLD   Presented to ED on 4/8 with complaints of fevers, sob, nonproductive cough. During workup, pt was found to have evidence suggesting RUL pna. Pt was recommended admission, however, pt declined and was ultimately discharged from the ED on augmentin and azithromycin. Blood cultures later returned pos for enterococcus and pt was asked to return to ED. Pt reports generalized weakness, increased cough, decreased exercise tolerance. Also reports fevers - in the ED, pt was found to be tachycardic with tachypnea. WBC noted to be 17.6. Blood cx from 4/8 confirmed enterococcus. ID was consulted and hospitalist requested for admission.  Patient work-up was significant for mitral valve endocarditis, he transferred to [name] for further management, he was seen by ID, and CT surgery.  As well he is diagnosed with COVID-19 of pneumonia, he is vaccinated, and had previous infection last August.   Subjective: 4/15 afebrile overnight, A/O x4, negative CP, negative SOB.  Concern that he had bilateral pedal edema after standing sink to wash.  Since returning to bed and elevating feet edema has for the most part resolved.  No pain   Assessment & Plan: Covid vaccination; vaccinated 3/3   Principal Problem:   Endocarditis of mitral valve Active Problems:   Sepsis ([place])   Bacteremia   COVID-19 virus infection   Osler node   Acute respiratory failure with hypoxia ([place])   Pneumonia due to COVID-19 virus   CAP (community acquired pneumonia)   Elevated liver enzymes  Enterococcus bacteremia -Continue current antibiotics -4/15 discussed case with Dr. Tommy [name] ID he recommends that we do not start the clock on actual antibiotic treatment until valve has been replaced/repaired.  After that patient will  require 6 weeks of antibiotics.     Sepsis POA - Presented with fevers, leukocytosis, tachycardia in the setting of Enterococcus bacteremia and endocarditis - ID following,; currently on ampicillin and ceftriaxone  Acute hypoxic respiratory failure due to COVID-19 pneumonia/ CAP pneumonia  COVID-19 Labs  Recent Labs    03/07/21 0251 03/08/21 0037 03/09/21 0040  DDIMER  --  0.60* 0.63*  FERRITIN  --  755* 627*  LDH  --  158 157  CRP 23.5* 11.0* 6.3*    Lab Results  Component Value Date   SARSCOV2NAA POSITIVE (A) 03/03/2021   Petal NEGATIVE 03/02/2021   -Remdesivir per pharmacy protocol -Solu-Medrol 60 mg BID -Vitamins per COVID protocol -Incentive spirometry -Flutter valve -Combivent QID -Given his endocarditis, and bacteremia, cannot receive baricitinib or Actemra -Continue current antibiotics which will cover both CAP and endocarditis -4/14 patient's respiratory status appears to be improving.  Endocarditis mitral valve -TTE remarkable for 1 x 1.5 cm vegetation on the mitral valve.  Cardiology consulted for TEE, timing per cardiology  -Continue antibiotics per ID recommendations. -4/13 repeat TEE on 4/14 -CT surgery consulted as well, further recommendation pending TEE and improvement of COVID-19.  Discussed with cardiology, plan for TEE on Thursday at 2 PM. -me an area on his finger that may represent an Osler's node as he has a tender area on his fingertip he also has a possible splinter as seen on picture above. -Need to monitor for further stigmata in particular evidence of septic embolization on antibiotic therapy as this would be a reason for more urgent cardiothoracic surgery. Dr.  Hendrickson with cardiothoracic surgery.  He does not see an urgent indication for surgery at this point in time. -Decision will be made on treatment options after repeat TEE - 4/15 repeat TEE: Spoke at length with Brandon [name] cardiology interventionalists, and PA [facility] [name] cardiothoracic surgery, and after discussing case was determined we should wait until Wednesday to perform TEE.  At which point we will determine replace/repair valve.  Elevated AST/ALT/alk phos  -Appears to be somewhat chronic, mild, trending down-unclear baseline -Downtrending since admission likely shock liver in the setting of sepsis as well due to Covid. -2018 labs markedly elevated (AST439/ALT760/AlkP230)  -unremarkable work-up at that time; considered for outpatient follow-up for hemochromatosis versus autoimmune work-up but does not appear to be any documentation in our system that this was ever done -4/15 liver enzymes normalized  Hypothyroid - TSH 2.5 -Synthroid 175 mcg daily  Normocytic anemia - Likely hemodilutional. - 4/14 occult blood pending - No signs or symptoms of occult bleed. Lab Results  Component Value Date   HGB 10.1 (L) 03/09/2021   HGB 9.9 (L) 03/08/2021   HGB 10.7 (L) 03/07/2021   HGB 10.6 (L) 03/06/2021   HGB 11.1 (L) 03/05/2021  -Stable     DVT prophylaxis: Lovenox Code Status: Full Family Communication:  Status is: Inpatient    Dispo: The patient is from: Home              Anticipated d/c is to: Home vs SNF              Anticipated d/c date is:??              Patient currently unstable      Consultants:  Dr. Roxan Hockey with cardiothoracic surgery ID Dr. Tommy [name] Cardiology Dr. Tommy [name] ID  Procedures/Significant Events:    I have personally reviewed and interpreted all radiology studies and my findings are as above.  VENTILATOR SETTINGS: Room air 4/15 SPO2 97%    Cultures   Antimicrobials: Anti-infectives (From admission, onward)   Start     Ordered Stop   03/05/21 1000  remdesivir 100 mg in sodium chloride 0.9 % 100 mL IVPB       "Followed by" Linked Group Details   03/04/21 0035 03/09/21 0959   03/04/21 1000  remdesivir 100 mg in sodium chloride 0.9 % 100 mL IVPB  Status:  Discontinued       "Followed by"  Linked Group Details   03/03/21 1806 03/04/21 0035   03/04/21 1000  remdesivir 200 mg in sodium chloride 0.9% 250 mL IVPB       "Followed by" Linked Group Details   03/04/21 0035 03/04/21 1230   03/03/21 2000  cefTRIAXone (ROCEPHIN) 2 g in sodium chloride 0.9 % 100 mL IVPB        03/03/21 1708     03/03/21 2000  remdesivir 200 mg in sodium chloride 0.9% 250 mL IVPB  Status:  Discontinued       "Followed by" Linked Group Details   03/03/21 1806 03/04/21 0035   03/03/21 1600  ampicillin (OMNIPEN) 2 g in sodium chloride 0.9 % 100 mL IVPB        03/03/21 1525         Devices    LINES / TUBES:      Continuous Infusions: . sodium chloride 10 mL/hr at 03/06/21 0000  . sodium chloride Stopped (03/05/21 0323)  . sodium chloride 20 mL/hr at 03/08/21 1933  . ampicillin (OMNIPEN) IV 2  g (03/09/21 0810)  . cefTRIAXone (ROCEPHIN)  IV 2 g (03/09/21 0902)     Objective: Vitals:   03/08/21 1949 03/08/21 2339 03/09/21 0420 03/09/21 0958  BP: 117/66 119/73 112/71 138/74  Pulse: 85 90 80 87  Resp: _0 Temp: 98.1 F (36.7 C) 98.1 F (36.7 C) 98 F (36.7 C) 98 F (36.7 C)  TempSrc: Axillary Axillary Axillary Oral  SpO2: 97% 94% 95% 95%  Weight:      Height:        Intake/Output Summary (Last 24 hours) at 03/09/2021 1051 Last data filed at 03/09/2021 0902 Gross per 24 hour  Intake --  Output 450 ml  Net -450 ml   Filed Weights   03/03/21 1835  Weight: 85.3 kg    Examination:  General: A/O x4, negative acute respiratory distress Eyes: negative scleral hemorrhage, negative anisocoria, negative icterus ENT: Negative Runny nose, negative gingival bleeding, Neck:  Negative scars, masses, torticollis, lymphadenopathy, JVD Lungs: Clear to auscultation bilaterally, positive cough with speaking or deep inspiration.   Cardiovascular: Regular rate and rhythm without murmur gallop or rub normal S1 and S2 Abdomen: negative abdominal pain, nondistended, positive soft, bowel  sounds, no rebound, no ascites, no appreciable mass Extremities: No significant cyanosis, clubbing, or edema bilateral lower extremities Skin: Negative rashes, lesions, ulcers Psychiatric:  Negative depression, positive anxiety, negative fatigue, negative mania  Central nervous system:  Cranial nerves II through XII intact, tongue/uvula midline, all extremities muscle strength 5/5, sensation intact throughout, negative dysarthria, negative expressive aphasia, negative receptive aphasia.  .     Data Reviewed: Care during the described time interval was provided by me .  I have reviewed this patient's available data, including medical history, events of note, physical examination, and all test results as part of my evaluation.  CBC: Recent Labs  Lab 03/02/21 1138 03/03/21 1358 03/03/21 1907 03/05/21 0042 03/06/21 0057 03/07/21 0251 03/08/21 0037 03/09/21 0040  WBC 14.1* 17.6*   < > 13.7* 16.2* 13.4* 13.0* 11.0*  NEUTROABS 13.0* 15.4*  --   --   --   --  11.7* 9.8*  HGB 11.7* 12.4*   < > 11.1* 10.6* 10.7* 9.9* 10.1*  HCT 36.2* 38.4*   < > 33.7* 32.6* 32.6* 30.0* 32.0*  MCV 92.6 94.1   < > 94.7 94.2 95.3 94.0 95.0  PLT 314 333   < > 325 287 359 402* 450*   < > = values in this interval not displayed.   Basic Metabolic Panel: Recent Labs  Lab 03/05/21 0042 03/06/21 0057 03/07/21 0251 03/08/21 0037 03/09/21 0040  NA 131* 132* 136 138 138  K 3.5 3.4* 4.3 4.0 3.7  CL 100 99 102 104 102  CO2 24 21* _1 GLUCOSE 98 116* 149* 135* 142*  BUN _2 27* 32*  CREATININE 1.18 0.89 1.01 0.91 0.97  CALCIUM 8.0* 8.1* 8.5* 8.3* 8.2*  MG  --   --   --  2.1 2.0  PHOS  --   --   --  3.5 3.3   GFR: Estimated Creatinine Clearance: 82.4 mL/min (by C-G formula based on SCr of 0.97 mg/dL). Liver Function Tests: Recent Labs  Lab 03/05/21 0042 03/06/21 0057 03/07/21 0251 03/08/21 0037 03/09/21 0040  AST 33 _3 ALT 63* 47* 41 46* 50*  ALKPHOS 181* 159* 138* 122 110   BILITOT 1.0 1.3* 0.9 0.5 0.5  PROT 6.3* 5.9* 6.3* 5.6* 5.6*  ALBUMIN  2.4* 2.1* 2.1* 2.0* 2.2*   Recent Labs  Lab 03/02/21 1138  LIPASE 27   No results for input(s): AMMONIA in the last 168 hours. Coagulation Profile: Recent Labs  Lab 03/02/21 1138 03/03/21 1515  INR 1.1 1.1   Cardiac Enzymes: Recent Labs  Lab 03/02/21 1138  CKTOTAL 14*   BNP (last 3 results) No results for input(s): PROBNP in the last 8760 hours. HbA1C: No results for input(s): HGBA1C in the last 72 hours. CBG: No results for input(s): GLUCAP in the last 168 hours. Lipid Profile: No results for input(s): CHOL, HDL, LDLCALC, TRIG, CHOLHDL, LDLDIRECT in the last 72 hours. Thyroid Function Tests: No results for input(s): TSH, T4TOTAL, FREET4, T3FREE, THYROIDAB in the last 72 hours. Anemia Panel: Recent Labs    03/08/21 0037 03/09/21 0040  FERRITIN 755* 627*   Sepsis Labs: Recent Labs  Lab 03/02/21 1138 03/03/21 1358 03/06/21 0057 03/07/21 0251 03/08/21 0037  PROCALCITON  --   --  0.25 0.19 0.16  LATICACIDVEN 1.8 1.1  --   --   --     Recent Results (from the past 240 hour(s))  Culture, blood (routine x 2)     Status: Abnormal   Collection Time: 03/02/21 11:50 AM   Specimen: BLOOD  Result Value Ref Range Status   Specimen Description   Final    BLOOD LEFT ANTECUBITAL Performed at [facility], [place] [place], [place] [address]    Special Requests   Final    BOTTLES DRAWN AEROBIC AND ANAEROBIC Blood Culture adequate volume Performed at [name], [address] [place], [place] [address]    Culture  Setup Time   Final    GRAM POSITIVE COCCI IN BOTH AEROBIC AND ANAEROBIC BOTTLES CRITICAL VALUE NOTED.  VALUE IS CONSISTENT WITH PREVIOUSLY REPORTED AND CALLED VALUE.    Culture (A)  Final    ENTEROCOCCUS FAECALIS SUSCEPTIBILITIES PERFORMED ON PREVIOUS CULTURE WITHIN THE LAST 5 DAYS. Performed at [name], [place] [address] [place], [place]  [address]    Report Status 03/05/2021 FINAL  Final  Culture, blood (routine x 2)     Status: Abnormal   Collection Time: 03/02/21  1:10 PM   Specimen: Right Antecubital; Blood  Result Value Ref Range Status   Specimen Description   Final    RIGHT ANTECUBITAL BLOOD Performed at [facility], [address] [place], [place] [address]    Special Requests   Final    BOTTLES DRAWN AEROBIC AND ANAEROBIC Blood Culture adequate volume Performed at [facility], [place] [place], [place] [address]    Culture  Setup Time   Final    GRAM POSITIVE COCCI IN CHAINS IN BOTH AEROBIC AND ANAEROBIC BOTTLES CRITICAL RESULT CALLED TO, READ BACK BY AND VERIFIED WITH: Josph Macho RN _0  03/03/21 EB Performed at [name], [place] [address], [place], [place] [address]    Culture ENTEROCOCCUS FAECALIS (A)  Final   Report Status 03/05/2021 FINAL  Final   Organism ID, Bacteria ENTEROCOCCUS FAECALIS  Final      Susceptibility   Enterococcus faecalis - MIC*    AMPICILLIN <=2 SENSITIVE Sensitive     VANCOMYCIN 1 SENSITIVE Sensitive     GENTAMICIN SYNERGY SENSITIVE Sensitive     * ENTEROCOCCUS FAECALIS  Blood Culture ID Panel (Reflexed)     Status: Abnormal   Collection Time: 03/02/21  1:10 PM  Result  Value Ref Range Status   Enterococcus faecalis DETECTED (A) NOT DETECTED Final    Comment: CRITICAL RESULT CALLED TO, READ BACK BY AND VERIFIED WITH: S GOUGE RN _0  03/03/21 EB    Enterococcus Faecium NOT DETECTED NOT DETECTED Final   Listeria monocytogenes NOT DETECTED NOT DETECTED Final   Staphylococcus species NOT DETECTED NOT DETECTED Final   Staphylococcus aureus (BCID) NOT DETECTED NOT DETECTED Final   Staphylococcus epidermidis NOT DETECTED NOT DETECTED Final   Staphylococcus lugdunensis NOT DETECTED NOT DETECTED Final   Streptococcus species NOT DETECTED NOT DETECTED Final   Streptococcus agalactiae NOT DETECTED NOT DETECTED Final   Streptococcus pneumoniae NOT  DETECTED NOT DETECTED Final   Streptococcus pyogenes NOT DETECTED NOT DETECTED Final   A.calcoaceticus-baumannii NOT DETECTED NOT DETECTED Final   Bacteroides fragilis NOT DETECTED NOT DETECTED Final   Enterobacterales NOT DETECTED NOT DETECTED Final   Enterobacter cloacae complex NOT DETECTED NOT DETECTED Final   Escherichia coli NOT DETECTED NOT DETECTED Final   Klebsiella aerogenes NOT DETECTED NOT DETECTED Final   Klebsiella oxytoca NOT DETECTED NOT DETECTED Final   Klebsiella pneumoniae NOT DETECTED NOT DETECTED Final   Proteus species NOT DETECTED NOT DETECTED Final   Salmonella species NOT DETECTED NOT DETECTED Final   Serratia marcescens NOT DETECTED NOT DETECTED Final   Haemophilus influenzae NOT DETECTED NOT DETECTED Final   Neisseria meningitidis NOT DETECTED NOT DETECTED Final   Pseudomonas aeruginosa NOT DETECTED NOT DETECTED Final   Stenotrophomonas maltophilia NOT DETECTED NOT DETECTED Final   Candida albicans NOT DETECTED NOT DETECTED Final   Candida auris NOT DETECTED NOT DETECTED Final   Candida glabrata NOT DETECTED NOT DETECTED Final   Candida krusei NOT DETECTED NOT DETECTED Final   Candida parapsilosis NOT DETECTED NOT DETECTED Final   Candida tropicalis NOT DETECTED NOT DETECTED Final   Cryptococcus neoformans/gattii NOT DETECTED NOT DETECTED Final   Vancomycin resistance NOT DETECTED NOT DETECTED Final    Comment: Performed at [facility], [address] [place], [place] [address]  Resp Panel by RT-PCR (Flu A&B, Covid) Nasopharyngeal Swab     Status: None   Collection Time: 03/02/21  3:41 PM   Specimen: Nasopharyngeal Swab; Nasopharyngeal(NP) swabs in vial transport medium  Result Value Ref Range Status   SARS Coronavirus 2 by RT PCR NEGATIVE NEGATIVE Final    Comment: (NOTE) SARS-CoV-2 target nucleic acids are NOT DETECTED.  The SARS-CoV-2 RNA is generally detectable in upper respiratory specimens during the acute phase of infection. The  lowest concentration of SARS-CoV-2 viral copies this assay can detect is 138 copies/mL. A negative result does not preclude SARS-Cov-2 infection and should not be used as the sole basis for treatment or other patient management decisions. A negative result may occur with  improper specimen collection/handling, submission of specimen other than nasopharyngeal swab, presence of viral mutation(s) within the areas targeted by this assay, and inadequate number of viral copies(<138 copies/mL). A negative result must be combined with clinical observations, patient history, and epidemiological information. The expected result is Negative.  Fact Sheet for Patients:  EntrepreneurPulse.com.au  Fact Sheet for Healthcare Providers:  IncredibleEmployment.be  This test is no t yet approved or cleared by the [place] FDA and  has been authorized for detection and/or diagnosis of SARS-CoV-2 by FDA under an Emergency Use Authorization (EUA). This EUA will remain  in effect (meaning this test can be used) for the duration of the COVID-19 declaration under Section 564(b)(1) of the Act, 21 U.S.C.section  360bbb-3(b)(1), unless the authorization is terminated  or revoked sooner.       Influenza A by PCR NEGATIVE NEGATIVE Final   Influenza B by PCR NEGATIVE NEGATIVE Final    Comment: (NOTE) The Xpert Xpress SARS-CoV-2/FLU/RSV plus assay is intended as an aid in the diagnosis of influenza from Nasopharyngeal swab specimens and should not be used as a sole basis for treatment. Nasal washings and aspirates are unacceptable for Xpert Xpress SARS-CoV-2/FLU/RSV testing.  Fact Sheet for Patients: EntrepreneurPulse.com.au  Fact Sheet for Healthcare Providers: IncredibleEmployment.be  This test is not yet approved or cleared by the [place] FDA and has been authorized for detection and/or diagnosis of SARS-CoV-2 by FDA under  an Emergency Use Authorization (EUA). This EUA will remain in effect (meaning this test can be used) for the duration of the COVID-19 declaration under Section 564(b)(1) of the Act, 21 U.S.C. section 360bbb-3(b)(1), unless the authorization is terminated or revoked.  Performed at [name], [address] [place], [place] [address]   Blood culture (routine x 2)     Status: None   Collection Time: 03/03/21  1:58 PM   Specimen: BLOOD  Result Value Ref Range Status   Specimen Description   Final    BLOOD LEFT ANTECUBITAL Performed at [place] [address] [place], [place] [address]    Special Requests   Final    BOTTLES DRAWN AEROBIC AND ANAEROBIC Blood Culture adequate volume Performed at [place] [address] [place], [place] [address]    Culture   Final    NO GROWTH 5 DAYS Performed at [name], [place] [address] [place], [place] [address]    Report Status 03/08/2021 FINAL  Final  Resp Panel by RT-PCR (Flu A&B, Covid) Nasopharyngeal Swab     Status: Abnormal   Collection Time: 03/03/21  3:08 PM   Specimen: Nasopharyngeal Swab; Nasopharyngeal(NP) swabs in vial transport medium  Result Value Ref Range Status   SARS Coronavirus 2 by RT PCR POSITIVE (A) NEGATIVE Final    Comment: RESULT CALLED TO, READ BACK BY AND VERIFIED WITH: BOWEN,M. RN AT 1628 03/03/21 MULLINS,T (NOTE) SARS-CoV-2 target nucleic acids are DETECTED.  The SARS-CoV-2 RNA is generally detectable in upper respiratory specimens during the acute phase of infection. Positive results are indicative of the presence of the identified virus, but do not rule out bacterial infection or co-infection with other pathogens not detected by the test. Clinical correlation with patient history and other diagnostic information is necessary to determine patient infection status. The expected result is Negative.  Fact Sheet for  Patients: EntrepreneurPulse.com.au  Fact Sheet for Healthcare Providers: IncredibleEmployment.be  This test is not yet approved or cleared by the [place] FDA and  has been authorized for detection and/or diagnosis of SARS-CoV-2 by FDA under an Emergency Use Authorization (EUA).  This EUA will remain in effect (meaning this test c an be used) for the duration of  the COVID-19 declaration under Section 564(b)(1) of the Act, 21 U.S.C. section 360bbb-3(b)(1), unless the authorization is terminated or revoked sooner.     Influenza A by PCR NEGATIVE NEGATIVE Final   Influenza B by PCR NEGATIVE NEGATIVE Final    Comment: (NOTE) The Xpert Xpress SARS-CoV-2/FLU/RSV plus assay is intended as an aid in the diagnosis of influenza from Nasopharyngeal swab specimens and should not be used as a sole basis for treatment. Nasal washings and aspirates are unacceptable for Xpert Xpress SARS-CoV-2/FLU/RSV  testing.  Fact Sheet for Patients: EntrepreneurPulse.com.au  Fact Sheet for Healthcare Providers: IncredibleEmployment.be  This test is not yet approved or cleared by the [place] FDA and has been authorized for detection and/or diagnosis of SARS-CoV-2 by FDA under an Emergency Use Authorization (EUA). This EUA will remain in effect (meaning this test can be used) for the duration of the COVID-19 declaration under Section 564(b)(1) of the Act, 21 U.S.C. section 360bbb-3(b)(1), unless the authorization is terminated or revoked.  Performed at [facility], [place] [address], [place], [place] [address]   Urine culture     Status: None   Collection Time: 03/03/21  3:08 PM   Specimen: In/Out Cath Urine  Result Value Ref Range Status   Specimen Description   Final    IN/OUT CATH URINE Performed at [place] [address], [place], [place] [address]    Special Requests    Final    NONE Performed at [facility], [place] [address], [place], [place] [address]    Culture   Final    NO GROWTH Performed at [name], [place] [address], [place], [place] [address]    Report Status 03/04/2021 FINAL  Final  Blood culture (routine x 2)     Status: None   Collection Time: 03/03/21  3:41 PM   Specimen: BLOOD  Result Value Ref Range Status   Specimen Description   Final    BLOOD RIGHT ANTECUBITAL Performed at [place] [address], [place], [place] [address]    Special Requests   Final    BOTTLES DRAWN AEROBIC AND ANAEROBIC Blood Culture adequate volume Performed at [place] [address] [place], [place] [address]    Culture   Final    NO GROWTH 5 DAYS Performed at [name], [place] [address] [place], [place] [address]    Report Status 03/08/2021 FINAL  Final  MRSA PCR Screening     Status: Abnormal   Collection Time: 03/04/21 11:53 PM   Specimen: Nasopharyngeal  Result Value Ref Range Status   MRSA by PCR POSITIVE (A) NEGATIVE Final    Comment:        The GeneXpert MRSA Assay (FDA approved for NASAL specimens only), is one component of a comprehensive MRSA colonization surveillance program. It is not intended to diagnose MRSA infection nor to guide or monitor treatment for MRSA infections. RESULT CALLED TO, READ BACK BY AND VERIFIED WITH: [facility] RN AT 0121 03/05/2021 MITCHELL,L Performed at [name], [place] [address] [place], [place] [address]          Radiology Studies: DG Chest Port 1 View  Result Date: 03/08/2021 CLINICAL DATA:  Shortness of breath.  Recent COVID-19 positive EXAM: PORTABLE CHEST 1 VIEW COMPARISON:  March 06, 2021 chest radiograph; chest radiograph and chest CT March 02, 2021 FINDINGS: There remains airspace opacity throughout the right upper lobe with volume loss. Airspace opacity throughout much of the left upper lobe as well as in  the left lower lung region and to a lesser extent in the right base remain. Heart is borderline enlarged with pulmonary vascular normal. No adenopathy. Postoperative change left clavicle. Evidence of previous thyroidectomy. IMPRESSION: Multifocal airspace opacity, most pronounced in the right upper lobe. There is slight increase in right upper lobe consolidation and volume loss compared to most recent study. Ill-defined airspace opacity elsewhere unchanged. Suspect multifocal pneumonia, potentially of atypical organism etiology.  Stable cardiac silhouette. Electronically Signed   By: Lowella Grip III M.D.   On: 03/08/2021 07:56        Scheduled Meds: . vitamin C  500 mg Oral Daily  . cholecalciferol  1,000 Units Oral Daily  . enoxaparin (LOVENOX) injection  40 mg Subcutaneous Q24H  . feeding supplement  237 mL Oral BID BM  . Ipratropium-Albuterol  1 puff Inhalation Q6H  . levothyroxine  175 mcg Oral QAC breakfast  . mouth rinse  15 mL Mouth Rinse BID  . methylPREDNISolone (SOLU-MEDROL) injection  60 mg Intravenous Q12H  . multivitamin with minerals  1 tablet Oral Daily  . mupirocin ointment  1 application Nasal BID  . pantoprazole  40 mg Oral Daily  . traZODone  50 mg Oral QHS  . zinc sulfate  220 mg Oral Daily   Continuous Infusions: . sodium chloride 10 mL/hr at 03/06/21 0000  . sodium chloride Stopped (03/05/21 0323)  . sodium chloride 20 mL/hr at 03/08/21 1933  . ampicillin (OMNIPEN) IV 2 g (03/09/21 0810)  . cefTRIAXone (ROCEPHIN)  IV 2 g (03/09/21 0902)     LOS: 6 days    Time spent:40 min    Nadie Fiumara, Geraldo Docker, MD [facility]   If 7PM-7AM, please contact night-coverage 03/09/2021, 10:51 AM

## 2021-03-09 NOTE — Progress Notes (Signed)
Procedure(s) (LRB): TRANSESOPHAGEAL ECHOCARDIOGRAM (TEE) (N/A) Subjective: C/o some swelling in his feet this AM, better after Lasix  Objective: Vital signs in last 24 hours: Temp:  [98 F (36.7 C)-98.1 F (36.7 C)] 98 F (36.7 C) (04/15 0958) Pulse Rate:  [80-90] 87 (04/15 0958) Cardiac Rhythm: Sinus tachycardia (04/15 0800) Resp:  [16-19] 16 (04/15 0958) BP: (112-138)/(66-74) 138/74 (04/15 0958) SpO2:  [94 %-97 %] 95 % (04/15 0958)  Hemodynamic parameters for last 24 hours:    Intake/Output from previous day: No intake/output data recorded. Intake/Output this shift: Total I/O In: 120 [P.O.:120] Out: 1400 [Urine:1400]  General appearance: alert, cooperative and no distress Neurologic: intact Heart: regular rate and rhythm and + murmur Lungs: Coarse BS right upper Extremities: trace edema  Lab Results: Recent Labs    03/08/21 0037 03/09/21 0040  WBC 13.0* 11.0*  HGB 9.9* 10.1*  HCT 30.0* 32.0*  PLT 402* 450*   BMET:  Recent Labs    03/08/21 0037 03/09/21 0040  NA 138 138  K 4.0 3.7  CL 104 102  CO2 26 25  GLUCOSE 135* 142*  BUN 27* 32*  CREATININE 0.91 0.97  CALCIUM 8.3* 8.2*    PT/INR: No results for input(s): LABPROT, INR in the last 72 hours. ABG    Component Value Date/Time   TCO2 27 03/30/2016 1130   CBG (last 3)  No results for input(s): GLUCAP in the last 72 hours.  Assessment/Plan: S/P Procedure(s) (LRB): TRANSESOPHAGEAL ECHOCARDIOGRAM (TEE) (N/A) -Mitral valve endocarditis - Covid 19 Pneumonia He is afebrile, did have some peripheral edema this AM, responded to Lasix TEE deferred until next Wednesday Continue medical therapy, antibiotics pending repeat echo    LOS: 6 days    Loreli Slot 03/09/2021

## 2021-03-09 NOTE — Progress Notes (Signed)
Subjective:  Complaining of fatigue and also lower extremity edema  Antibiotics:  Anti-infectives (From admission, onward)   Start     Dose/Rate Route Frequency Ordered Stop   03/05/21 1000  remdesivir 100 mg in sodium chloride 0.9 % 100 mL IVPB       "Followed by" Linked Group Details   100 mg 200 mL/hr over 30 Minutes Intravenous Daily 03/04/21 0035 03/08/21 1024   03/04/21 1000  remdesivir 100 mg in sodium chloride 0.9 % 100 mL IVPB  Status:  Discontinued       "Followed by" Linked Group Details   100 mg 200 mL/hr over 30 Minutes Intravenous Daily 03/03/21 1806 03/04/21 0035   03/04/21 1000  remdesivir 200 mg in sodium chloride 0.9% 250 mL IVPB       "Followed by" Linked Group Details   200 mg 580 mL/hr over 30 Minutes Intravenous Once 03/04/21 0035 03/04/21 1230   03/03/21 2000  cefTRIAXone (ROCEPHIN) 2 g in sodium chloride 0.9 % 100 mL IVPB        2 g 200 mL/hr over 30 Minutes Intravenous Every 12 hours 03/03/21 1708     03/03/21 2000  remdesivir 200 mg in sodium chloride 0.9% 250 mL IVPB  Status:  Discontinued       "Followed by" Linked Group Details   200 mg 580 mL/hr over 30 Minutes Intravenous Once 03/03/21 1806 03/04/21 0035   03/03/21 1600  ampicillin (OMNIPEN) 2 g in sodium chloride 0.9 % 100 mL IVPB        2 g 300 mL/hr over 20 Minutes Intravenous Every 4 hours 03/03/21 1525        Medications: Scheduled Meds: . vitamin C  500 mg Oral Daily  . cholecalciferol  1,000 Units Oral Daily  . enoxaparin (LOVENOX) injection  40 mg Subcutaneous Q24H  . feeding supplement  237 mL Oral BID BM  . Ipratropium-Albuterol  1 puff Inhalation Q6H  . levothyroxine  175 mcg Oral QAC breakfast  . mouth rinse  15 mL Mouth Rinse BID  . methylPREDNISolone (SOLU-MEDROL) injection  60 mg Intravenous Q12H  . multivitamin with minerals  1 tablet Oral Daily  . mupirocin ointment  1 application Nasal BID  . pantoprazole  40 mg Oral Daily  . traZODone  50 mg Oral QHS  . zinc  sulfate  220 mg Oral Daily   Continuous Infusions: . sodium chloride 10 mL/hr at 03/06/21 0000  . sodium chloride Stopped (03/05/21 0323)  . sodium chloride 20 mL/hr at 03/08/21 1933  . ampicillin (OMNIPEN) IV 0 g (03/09/21 1710)  . cefTRIAXone (ROCEPHIN)  IV 0 g (03/09/21 0940)   PRN Meds:.sodium chloride, sodium chloride, acetaminophen **OR** acetaminophen, albuterol, chlorpheniramine-HYDROcodone, fluticasone, guaiFENesin-dextromethorphan, hydrALAZINE    Objective: Weight change:   Intake/Output Summary (Last 24 hours) at 03/09/2021 1846 Last data filed at 03/09/2021 1842 Gross per 24 hour  Intake 1918.73 ml  Output 1400 ml  Net 518.73 ml   Blood pressure 138/74, pulse 87, temperature 98 F (36.7 C), temperature source Oral, resp. rate 16, height 6\' 1"  (1.854 m), weight 85.3 kg, SpO2 95 %. Temp:  [98 F (36.7 C)-98.1 F (36.7 C)] 98 F (36.7 C) (04/15 0958) Pulse Rate:  [80-90] 87 (04/15 0958) Resp:  [16-19] 16 (04/15 0958) BP: (112-138)/(66-74) 138/74 (04/15 0958) SpO2:  [94 %-97 %] 95 % (04/15 0958)  Physical Exam: Physical Exam Constitutional:      Appearance: He is well-developed.  HENT:     Head: Normocephalic and atraumatic.  Eyes:     Conjunctiva/sclera: Conjunctivae normal.  Cardiovascular:     Rate and Rhythm: Normal rate and regular rhythm.     Heart sounds: Murmur heard.    Pulmonary:     Effort: Pulmonary effort is normal. No respiratory distress.     Breath sounds: No stridor. No wheezing or rhonchi.  Abdominal:     General: There is no distension.     Palpations: Abdomen is soft. There is no mass.  Musculoskeletal:        General: Normal range of motion.     Cervical back: Normal range of motion and neck supple.     Right lower leg: 3+ Edema present.     Left lower leg: 3+ Edema present.  Skin:    General: Skin is warm and dry.     Findings: No erythema or rash.  Neurological:     General: No focal deficit present.     Mental Status: He is  alert and oriented to person, place, and time.  Psychiatric:        Mood and Affect: Mood normal.        Behavior: Behavior normal.        Thought Content: Thought content normal.        Judgment: Judgment normal.     Possible Osler's and splinter hemorrhages are stable today versus yesterday [place]  seems to be getting better Left hand see pictures for 11/13/2021:      Left hand pictures  March 07, 2021:        Right hand March 07, 2021: Note he states that the possible splinter there was present for several days at least if not longer.         CBC:    BMET Recent Labs    03/08/21 0037 03/09/21 0040  NA 138 138  K 4.0 3.7  CL 104 102  CO2 26 25  GLUCOSE 135* 142*  BUN 27* 32*  CREATININE 0.91 0.97  CALCIUM 8.3* 8.2*     Liver Panel  Recent Labs    03/08/21 0037 03/09/21 0040  PROT 5.6* 5.6*  ALBUMIN 2.0* 2.2*  AST 26 28  ALT 46* 50*  ALKPHOS 122 110  BILITOT 0.5 0.5       Sedimentation Rate No results for input(s): ESRSEDRATE in the last 72 hours. C-Reactive Protein Recent Labs    03/08/21 0037 03/09/21 0040  CRP 11.0* 6.3*    Micro Results: Recent Results (from the past 720 hour(s))  Culture, blood (routine x 2)     Status: Abnormal   Collection Time: 03/02/21 11:50 AM   Specimen: BLOOD  Result Value Ref Range Status   Specimen Description   Final    BLOOD LEFT ANTECUBITAL Performed at [facility], [address] [place], [place] [address]    Special Requests   Final    BOTTLES DRAWN AEROBIC AND ANAEROBIC Blood Culture adequate volume Performed at [facility], [address] [place], [place] [address]    Culture  Setup Time   Final    GRAM POSITIVE COCCI IN BOTH AEROBIC AND ANAEROBIC BOTTLES CRITICAL VALUE NOTED.  VALUE IS CONSISTENT WITH PREVIOUSLY REPORTED AND CALLED VALUE.    Culture (A)  Final    ENTEROCOCCUS FAECALIS SUSCEPTIBILITIES PERFORMED ON PREVIOUS CULTURE WITHIN THE LAST 5  DAYS. Performed at [facility], [address], [place], [place]  [address]    Report Status 03/05/2021 FINAL  Final  Culture, blood (routine x 2)     Status: Abnormal   Collection Time: 03/02/21  1:10 PM   Specimen: Right Antecubital; Blood  Result Value Ref Range Status   Specimen Description   Final    RIGHT ANTECUBITAL BLOOD Performed at [facility], [address] [place], [place] [address]    Special Requests   Final    BOTTLES DRAWN AEROBIC AND ANAEROBIC Blood Culture adequate volume Performed at [facility], [address] [place], [place] [address]    Culture  Setup Time   Final    GRAM POSITIVE COCCI IN CHAINS IN BOTH AEROBIC AND ANAEROBIC BOTTLES CRITICAL RESULT CALLED TO, READ BACK BY AND VERIFIED WITH: Heywood Bene RN  03/03/21 EB Performed at [facility], [address], [place], [place] [address]    Culture ENTEROCOCCUS FAECALIS (A)  Final   Report Status 03/05/2021 FINAL  Final   Organism ID, Bacteria ENTEROCOCCUS FAECALIS  Final      Susceptibility   Enterococcus faecalis - MIC*    AMPICILLIN <=2 SENSITIVE Sensitive     VANCOMYCIN 1 SENSITIVE Sensitive     GENTAMICIN SYNERGY SENSITIVE Sensitive     * ENTEROCOCCUS FAECALIS  Blood Culture ID Panel (Reflexed)     Status: Abnormal   Collection Time: 03/02/21  1:10 PM  Result Value Ref Range Status   Enterococcus faecalis DETECTED (A) NOT DETECTED Final    Comment: CRITICAL RESULT CALLED TO, READ BACK BY AND VERIFIED WITH: S GOUGE RN  03/03/21 EB    Enterococcus Faecium NOT DETECTED NOT DETECTED Final   Listeria monocytogenes NOT DETECTED NOT DETECTED Final   Staphylococcus species NOT DETECTED NOT DETECTED Final   Staphylococcus aureus (BCID) NOT DETECTED NOT DETECTED Final   Staphylococcus epidermidis NOT DETECTED NOT DETECTED Final   Staphylococcus lugdunensis NOT DETECTED NOT DETECTED Final   Streptococcus species NOT DETECTED NOT DETECTED Final   Streptococcus  agalactiae NOT DETECTED NOT DETECTED Final   Streptococcus pneumoniae NOT DETECTED NOT DETECTED Final   Streptococcus pyogenes NOT DETECTED NOT DETECTED Final   A.calcoaceticus-baumannii NOT DETECTED NOT DETECTED Final   Bacteroides fragilis NOT DETECTED NOT DETECTED Final   Enterobacterales NOT DETECTED NOT DETECTED Final   Enterobacter cloacae complex NOT DETECTED NOT DETECTED Final   Escherichia coli NOT DETECTED NOT DETECTED Final   Klebsiella aerogenes NOT DETECTED NOT DETECTED Final   Klebsiella oxytoca NOT DETECTED NOT DETECTED Final   Klebsiella pneumoniae NOT DETECTED NOT DETECTED Final   Proteus species NOT DETECTED NOT DETECTED Final   Salmonella species NOT DETECTED NOT DETECTED Final   Serratia marcescens NOT DETECTED NOT DETECTED Final   Haemophilus influenzae NOT DETECTED NOT DETECTED Final   Neisseria meningitidis NOT DETECTED NOT DETECTED Final   Pseudomonas aeruginosa NOT DETECTED NOT DETECTED Final   Stenotrophomonas maltophilia NOT DETECTED NOT DETECTED Final   Candida albicans NOT DETECTED NOT DETECTED Final   Candida auris NOT DETECTED NOT DETECTED Final   Candida glabrata NOT DETECTED NOT DETECTED Final   Candida krusei NOT DETECTED NOT DETECTED Final   Candida parapsilosis NOT DETECTED NOT DETECTED Final   Candida tropicalis NOT DETECTED NOT DETECTED Final   Cryptococcus neoformans/gattii NOT DETECTED NOT DETECTED Final   Vancomycin resistance NOT DETECTED NOT DETECTED Final    Comment: Performed at [facility], [address], [place], [place] [address]  Resp Panel by RT-PCR (  Flu A&B, Covid) Nasopharyngeal Swab     Status: None   Collection Time: 03/02/21  3:41 PM   Specimen: Nasopharyngeal Swab; Nasopharyngeal(NP) swabs in vial transport medium  Result Value Ref Range Status   SARS Coronavirus 2 by RT PCR NEGATIVE NEGATIVE Final    Comment: (NOTE) SARS-CoV-2 target nucleic acids are NOT DETECTED.  The SARS-CoV-2 RNA is generally detectable in upper  respiratory specimens during the acute phase of infection. The lowest concentration of SARS-CoV-2 viral copies this assay can detect is 138 copies/mL. A negative result does not preclude SARS-Cov-2 infection and should not be used as the sole basis for treatment or other patient management decisions. A negative result may occur with  improper specimen collection/handling, submission of specimen other than nasopharyngeal swab, presence of viral mutation(s) within the areas targeted by this assay, and inadequate number of viral copies(<138 copies/mL). A negative result must be combined with clinical observations, patient history, and epidemiological information. The expected result is Negative.  Fact Sheet for Patients:  BloggerCourse.com  Fact Sheet for Healthcare Providers:  SeriousBroker.it  This test is no t yet approved or cleared by the [place] FDA and  has been authorized for detection and/or diagnosis of SARS-CoV-2 by FDA under an Emergency Use Authorization (EUA). This EUA will remain  in effect (meaning this test can be used) for the duration of the COVID-19 declaration under Section 564(b)(1) of the Act, 21 U.S.C.section 360bbb-3(b)(1), unless the authorization is terminated  or revoked sooner.       Influenza A by PCR NEGATIVE NEGATIVE Final   Influenza B by PCR NEGATIVE NEGATIVE Final    Comment: (NOTE) The Xpert Xpress SARS-CoV-2/FLU/RSV plus assay is intended as an aid in the diagnosis of influenza from Nasopharyngeal swab specimens and should not be used as a sole basis for treatment. Nasal washings and aspirates are unacceptable for Xpert Xpress SARS-CoV-2/FLU/RSV testing.  Fact Sheet for Patients: BloggerCourse.com  Fact Sheet for Healthcare Providers: SeriousBroker.it  This test is not yet approved or cleared by the [place] FDA and has been  authorized for detection and/or diagnosis of SARS-CoV-2 by FDA under an Emergency Use Authorization (EUA). This EUA will remain in effect (meaning this test can be used) for the duration of the COVID-19 declaration under Section 564(b)(1) of the Act, 21 U.S.C. section 360bbb-3(b)(1), unless the authorization is terminated or revoked.  Performed at [facility], [address] [place], [place] [address]   Blood culture (routine x 2)     Status: None   Collection Time: 03/03/21  1:58 PM   Specimen: BLOOD  Result Value Ref Range Status   Specimen Description   Final    BLOOD LEFT ANTECUBITAL Performed at [facility], [address], [place], [place] [address]    Special Requests   Final    BOTTLES DRAWN AEROBIC AND ANAEROBIC Blood Culture adequate volume Performed at [facility], [address], [place], [place] [address]    Culture   Final    NO GROWTH 5 DAYS Performed at [facility], [address] [place], [place] [address]    Report Status 03/08/2021 FINAL  Final  Resp Panel by RT-PCR (Flu A&B, Covid) Nasopharyngeal Swab     Status: Abnormal   Collection Time: 03/03/21  3:08 PM   Specimen: Nasopharyngeal Swab; Nasopharyngeal(NP) swabs in vial transport medium  Result Value Ref Range Status   SARS Coronavirus 2 by RT PCR POSITIVE (A) NEGATIVE  Final    Comment: RESULT CALLED TO, READ BACK BY AND VERIFIED WITH: BOWEN,M. RN AT 1628 03/03/21 MULLINS,T (NOTE) SARS-CoV-2 target nucleic acids are DETECTED.  The SARS-CoV-2 RNA is generally detectable in upper respiratory specimens during the acute phase of infection. Positive results are indicative of the presence of the identified virus, but do not rule out bacterial infection or co-infection with other pathogens not detected by the test. Clinical correlation with patient history and other diagnostic information is necessary to determine patient infection status. The  expected result is Negative.  Fact Sheet for Patients: BloggerCourse.com  Fact Sheet for Healthcare Providers: SeriousBroker.it  This test is not yet approved or cleared by the [place] FDA and  has been authorized for detection and/or diagnosis of SARS-CoV-2 by FDA under an Emergency Use Authorization (EUA).  This EUA will remain in effect (meaning this test c an be used) for the duration of  the COVID-19 declaration under Section 564(b)(1) of the Act, 21 U.S.C. section 360bbb-3(b)(1), unless the authorization is terminated or revoked sooner.     Influenza A by PCR NEGATIVE NEGATIVE Final   Influenza B by PCR NEGATIVE NEGATIVE Final    Comment: (NOTE) The Xpert Xpress SARS-CoV-2/FLU/RSV plus assay is intended as an aid in the diagnosis of influenza from Nasopharyngeal swab specimens and should not be used as a sole basis for treatment. Nasal washings and aspirates are unacceptable for Xpert Xpress SARS-CoV-2/FLU/RSV testing.  Fact Sheet for Patients: BloggerCourse.com  Fact Sheet for Healthcare Providers: SeriousBroker.it  This test is not yet approved or cleared by the [place] FDA and has been authorized for detection and/or diagnosis of SARS-CoV-2 by FDA under an Emergency Use Authorization (EUA). This EUA will remain in effect (meaning this test can be used) for the duration of the COVID-19 declaration under Section 564(b)(1) of the Act, 21 U.S.C. section 360bbb-3(b)(1), unless the authorization is terminated or revoked.  Performed at [facility], [address] [place], [place] [address]   Urine culture     Status: None   Collection Time: 03/03/21  3:08 PM   Specimen: In/Out Cath Urine  Result Value Ref Range Status   Specimen Description   Final    IN/OUT CATH URINE Performed at [facility], [address],  [place], [place] [address]    Special Requests   Final    NONE Performed at [facility], [address], [place], [place] [address]    Culture   Final    NO GROWTH Performed at [facility], [address], [place], [place] [address]    Report Status 03/04/2021 FINAL  Final  Blood culture (routine x 2)     Status: None   Collection Time: 03/03/21  3:41 PM   Specimen: BLOOD  Result Value Ref Range Status   Specimen Description   Final    BLOOD RIGHT ANTECUBITAL Performed at [facility], [address] [place], [place] [address]    Special Requests   Final    BOTTLES DRAWN AEROBIC AND ANAEROBIC Blood Culture adequate volume Performed at [facility], [address] [place], [place] [address]    Culture   Final    NO GROWTH 5 DAYS Performed at [facility], [address] [place], [place] [address]    Report Status 03/08/2021 FINAL  Final  MRSA PCR Screening     Status: Abnormal   Collection Time: 03/04/21 11:53 PM  Specimen: Nasopharyngeal  Result Value Ref Range Status   MRSA by PCR POSITIVE (A) NEGATIVE Final    Comment:        The GeneXpert MRSA Assay (FDA approved for NASAL specimens only), is one component of a comprehensive MRSA colonization surveillance program. It is not intended to diagnose MRSA infection nor to guide or monitor treatment for MRSA infections. RESULT CALLED TO, READ BACK BY AND VERIFIED WITH: [facility] RN AT 0121 03/05/2021 MITCHELL,L Performed at [facility], [address], [place], [place] [address]     Studies/Results: DG Chest Port 1 View  Result Date: 03/08/2021 CLINICAL DATA:  Shortness of breath.  Recent COVID-19 positive EXAM: PORTABLE CHEST 1 VIEW COMPARISON:  March 06, 2021 chest radiograph; chest radiograph and chest CT March 02, 2021 FINDINGS: There remains airspace opacity throughout the right upper lobe with volume loss. Airspace opacity throughout much of  the left upper lobe as well as in the left lower lung region and to a lesser extent in the right base remain. Heart is borderline enlarged with pulmonary vascular normal. No adenopathy. Postoperative change left clavicle. Evidence of previous thyroidectomy. IMPRESSION: Multifocal airspace opacity, most pronounced in the right upper lobe. There is slight increase in right upper lobe consolidation and volume loss compared to most recent study. Ill-defined airspace opacity elsewhere unchanged. Suspect multifocal pneumonia, potentially of atypical organism etiology. Stable cardiac silhouette. Electronically Signed   By: Bretta Bang III M.D.   On: 03/08/2021 07:56      Assessment/Plan:  INTERVAL HISTORY: TEE has been postponed till next week  Principal Problem:   Endocarditis of mitral valve Active Problems:   Sepsis (HCC)   Bacteremia   COVID-19 virus infection   Osler node   Acute respiratory failure with hypoxia (HCC)   Pneumonia due to COVID-19 virus   CAP (community acquired pneumonia)   Elevated liver enzymes    Brandon [name] is a 70 y.o. male with prior history of COVID-19 infection smoldering symptoms with hindsight likely due to subacute bacterial endocarditis which she has now been proven to have on 2D echocardiogram with Enterococcus faecalis growing in blood.  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  Patient has been seen by Dr. Dorris Fetch with cardiothoracic surgery.    Patient for transesophageal echocardiogram early next week   Continue ceftriaxone and  ampicillin   #2 COVID 19: Second infection with this virus.  He is on remdesivir and steroids.  #3 Pneumonia: Suspect the pneumonia is largely being driven by COVID-19 no reason to broaden antibiotics at this time.  #4 LE edema: Likely multifactorial  Dr. Elinor Parkinson is billable for questions this weekend and I will be back on Monday.    LOS: 6 days   Acey Lav 03/09/2021, 6:46 PM

## 2021-03-10 DIAGNOSIS — R7881 Bacteremia: Secondary | ICD-10-CM | POA: Diagnosis not present

## 2021-03-10 DIAGNOSIS — A419 Sepsis, unspecified organism: Secondary | ICD-10-CM | POA: Diagnosis not present

## 2021-03-10 DIAGNOSIS — U071 COVID-19: Secondary | ICD-10-CM | POA: Diagnosis not present

## 2021-03-10 DIAGNOSIS — I059 Rheumatic mitral valve disease, unspecified: Secondary | ICD-10-CM | POA: Diagnosis not present

## 2021-03-10 LAB — CBC WITH DIFFERENTIAL/PLATELET
Abs Immature Granulocytes: 0.25 10*3/uL — ABNORMAL HIGH (ref 0.00–0.07)
Basophils Absolute: 0 10*3/uL (ref 0.0–0.1)
Basophils Relative: 0 %
Eosinophils Absolute: 0 10*3/uL (ref 0.0–0.5)
Eosinophils Relative: 0 %
HCT: 33.6 % — ABNORMAL LOW (ref 39.0–52.0)
Hemoglobin: 10.5 g/dL — ABNORMAL LOW (ref 13.0–17.0)
Immature Granulocytes: 2 %
Lymphocytes Relative: 7 %
Lymphs Abs: 0.8 10*3/uL (ref 0.7–4.0)
MCH: 29.6 pg (ref 26.0–34.0)
MCHC: 31.3 g/dL (ref 30.0–36.0)
MCV: 94.6 fL (ref 80.0–100.0)
Monocytes Absolute: 0.3 10*3/uL (ref 0.1–1.0)
Monocytes Relative: 3 %
Neutro Abs: 10.3 10*3/uL — ABNORMAL HIGH (ref 1.7–7.7)
Neutrophils Relative %: 88 %
Platelets: 452 10*3/uL — ABNORMAL HIGH (ref 150–400)
RBC: 3.55 MIL/uL — ABNORMAL LOW (ref 4.22–5.81)
RDW: 13.8 % (ref 11.5–15.5)
WBC: 11.7 10*3/uL — ABNORMAL HIGH (ref 4.0–10.5)
nRBC: 0.2 % (ref 0.0–0.2)

## 2021-03-10 LAB — COMPREHENSIVE METABOLIC PANEL
ALT: 45 U/L — ABNORMAL HIGH (ref 0–44)
AST: 23 U/L (ref 15–41)
Albumin: 2.2 g/dL — ABNORMAL LOW (ref 3.5–5.0)
Alkaline Phosphatase: 97 U/L (ref 38–126)
Anion gap: 9 (ref 5–15)
BUN: 29 mg/dL — ABNORMAL HIGH (ref 8–23)
CO2: 26 mmol/L (ref 22–32)
Calcium: 8.2 mg/dL — ABNORMAL LOW (ref 8.9–10.3)
Chloride: 103 mmol/L (ref 98–111)
Creatinine, Ser: 1.03 mg/dL (ref 0.61–1.24)
GFR, Estimated: >60 mL/min (ref >60)
Glucose, Bld: 156 mg/dL — ABNORMAL HIGH (ref 70–99)
Potassium: 3.9 mmol/L (ref 3.5–5.1)
Sodium: 138 mmol/L (ref 135–145)
Total Bilirubin: 0.6 mg/dL (ref 0.3–1.2)
Total Protein: 5.4 g/dL — ABNORMAL LOW (ref 6.5–8.1)

## 2021-03-10 LAB — LACTATE DEHYDROGENASE: LDH: 163 U/L (ref 98–192)

## 2021-03-10 LAB — FERRITIN: Ferritin: 577 ng/mL — ABNORMAL HIGH (ref 24–336)

## 2021-03-10 LAB — C-REACTIVE PROTEIN: CRP: 2.7 mg/dL — ABNORMAL HIGH (ref <1.0)

## 2021-03-10 LAB — D-DIMER, QUANTITATIVE: D-Dimer, Quant: 0.93 ug/mL-FEU — ABNORMAL HIGH (ref 0.00–0.50)

## 2021-03-10 LAB — OCCULT BLOOD X 1 CARD TO LAB, STOOL: Fecal Occult Bld: NEGATIVE

## 2021-03-10 LAB — MAGNESIUM: Magnesium: 2.1 mg/dL (ref 1.7–2.4)

## 2021-03-10 LAB — PHOSPHORUS: Phosphorus: 4.1 mg/dL (ref 2.5–4.6)

## 2021-03-10 NOTE — Progress Notes (Signed)
PROGRESS NOTE    Brandon [name]  ZOX:[id number] DOB: [birth year] DOA: 03/03/2021 PCP: Kristen Loader, FNP     Brief Narrative:  Brandon [name] a 70 y.o.WM PMHx prior covid pna, hypothyroid, HLD   Presented to ED on 4/8 with complaints of fevers, sob, nonproductive cough. During workup, pt was found to have evidence suggesting RUL pna. Pt was recommended admission, however, pt declined and was ultimately discharged from the ED on augmentin and azithromycin. Blood cultures later returned pos for enterococcus and pt was asked to return to ED. Pt reports generalized weakness, increased cough, decreased exercise tolerance. Also reports fevers - in the ED, pt was found to be tachycardic with tachypnea. WBC noted to be 17.6. Blood cx from 4/8 confirmed enterococcus. ID was consulted and hospitalist requested for admission.  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  As well he is diagnosed with COVID-19 of pneumonia, he is vaccinated, and had previous infection last August.   Subjective: 4/16 afebrile overnight A/O x4, sitting in chair comfortably.  States has had a little bit more difficulty with wheezing overnight and cough.    Assessment & Plan: Covid vaccination; vaccinated 3/3   Principal Problem:   Endocarditis of mitral valve Active Problems:   Sepsis ([place])   Bacteremia   COVID-19 virus infection   Osler node   Acute respiratory failure with hypoxia ([place])   Pneumonia due to COVID-19 virus   CAP (community acquired pneumonia)   Elevated liver enzymes  Enterococcus bacteremia -Continue current antibiotics -4/15 discussed case with Dr. Tommy Medal ID he recommends that we do not start the clock on actual antibiotic treatment until valve has been replaced/repaired.  After that patient will require 6 weeks of antibiotics.     Sepsis POA - Presented with fevers,  leukocytosis, tachycardia in the setting of Enterococcus bacteremia and endocarditis - ID following,; currently on ampicillin and ceftriaxone  Acute hypoxic respiratory failure due to COVID-19 pneumonia/ CAP pneumonia  COVID-19 Labs  Recent Labs    03/08/21 0037 03/09/21 0040 03/10/21 0503  DDIMER 0.60* 0.63* 0.93*  FERRITIN 755* 627* 577*  LDH 158 157 163  CRP 11.0* 6.3* 2.7*    Lab Results  Component Value Date   SARSCOV2NAA POSITIVE (A) 03/03/2021   [place] NEGATIVE 03/02/2021   -Remdesivir per pharmacy protocol -Solu-Medrol 60 mg BID -Vitamins per COVID protocol -Incentive spirometry -Flutter valve -Combivent QID - Albuterol PRN -Given his endocarditis, and bacteremia, cannot receive baricitinib or Actemra -Continue current antibiotics which will cover both CAP and endocarditis -4/14 patient's respiratory status appears to be improving.  Endocarditis mitral valve -TTE remarkable for 1 x 1.5 cm vegetation on the mitral valve.  Cardiology consulted for TEE, timing per cardiology  -Continue antibiotics per ID recommendations. -4/13 repeat TEE on 4/14 -CT surgery consulted as well, further recommendation pending TEE and improvement of COVID-19.  Discussed with cardiology, plan for TEE on Thursday at 2 PM. -me an area on his finger that may represent an Osler's node as he has a tender area on his fingertip he also has a possible splinter as seen on picture above. -Need to monitor for further stigmata in particular evidence of septic embolization on antibiotic therapy as this would be a reason for more urgent cardiothoracic surgery. Dr. Roxan Hockey with cardiothoracic surgery.  He does not see an urgent indication for surgery at this point in time. -  Decision will be made on treatment options after repeat TEE - 4/15 repeat TEE: Spoke at length with [facility] cardiology interventionalists, and [facility] cardiothoracic surgery, and after discussing case was  determined we should wait until Wednesday to perform TEE.  At which point we will determine replace/repair valve.  Elevated AST/ALT/alk phos  -Appears to be somewhat chronic, mild, trending down-unclear baseline -Downtrending since admission likely shock liver in the setting of sepsis as well due to Covid. -2018 labs markedly elevated (AST439/ALT760/AlkP230)  -unremarkable work-up at that time; considered for outpatient follow-up for hemochromatosis versus autoimmune work-up but does not appear to be any documentation in our system that this was ever done -4/15 liver enzymes normalized  Hypothyroid - TSH 2.5 -Synthroid 175 mcg daily  Normocytic anemia - Likely hemodilutional. - 4/14 occult blood pending - No signs or symptoms of occult bleed. Lab Results  Component Value Date   HGB 10.5 (L) 03/10/2021   HGB 10.1 (L) 03/09/2021   HGB 9.9 (L) 03/08/2021   HGB 10.7 (L) 03/07/2021   HGB 10.6 (L) 03/06/2021  -Stable     DVT prophylaxis: Lovenox Code Status: Full Family Communication: 4/16 daughter at bedside for discussion of plan of care all questions answered Status is: Inpatient    Dispo: The patient is from: Home              Anticipated d/c is to: Home vs SNF              Anticipated d/c date is:??              Patient currently unstable      Consultants:  Dr. Roxan Hockey with cardiothoracic surgery ID Dr. Tommy Medal Cardiology Dr. Tommy Medal ID  Procedures/Significant Events:    I have personally reviewed and interpreted all radiology studies and my findings are as above.  VENTILATOR SETTINGS: Nasal cannula 4/16 Flow 2 L/min SPO2: 95%     Cultures   Antimicrobials: Anti-infectives (From admission, onward)   Start     Ordered Stop   03/05/21 1000  remdesivir 100 mg in sodium chloride 0.9 % 100 mL IVPB       "Followed by" Linked Group Details   03/04/21 0035 03/09/21 0959   03/04/21 1000  remdesivir 100 mg in sodium chloride 0.9 % 100 mL IVPB   Status:  Discontinued       "Followed by" Linked Group Details   03/03/21 1806 03/04/21 0035   03/04/21 1000  remdesivir 200 mg in sodium chloride 0.9% 250 mL IVPB       "Followed by" Linked Group Details   03/04/21 0035 03/04/21 1230   03/03/21 2000  cefTRIAXone (ROCEPHIN) 2 g in sodium chloride 0.9 % 100 mL IVPB        03/03/21 1708     03/03/21 2000  remdesivir 200 mg in sodium chloride 0.9% 250 mL IVPB  Status:  Discontinued       "Followed by" Linked Group Details   03/03/21 1806 03/04/21 0035   03/03/21 1600  ampicillin (OMNIPEN) 2 g in sodium chloride 0.9 % 100 mL IVPB        03/03/21 1525         Devices    LINES / TUBES:      Continuous Infusions: . sodium chloride 10 mL/hr at 03/06/21 0000  . sodium chloride Stopped (03/05/21 0323)  . sodium chloride 20 mL/hr at 03/08/21 1933  . ampicillin (OMNIPEN) IV 2 g (03/10/21 0845)  .  cefTRIAXone (ROCEPHIN)  IV 2 g (03/09/21 2056)     Objective: Vitals:   03/09/21 2013 03/10/21 0010 03/10/21 0400 03/10/21 0719  BP: 133/80 131/77 134/77 (!) 143/83  Pulse: 96 86 88 89  Resp: '16 18 19 18  ' Temp: 98.1 F (36.7 C) 98 F (36.7 C) 98.3 F (36.8 C)   TempSrc: Axillary Axillary Axillary   SpO2: 97% 94% 93% 93%  Weight:      Height:        Intake/Output Summary (Last 24 hours) at 03/10/2021 0911 Last data filed at 03/10/2021 0600 Gross per 24 hour  Intake 2888.49 ml  Output 1450 ml  Net 1438.49 ml   Filed Weights   03/03/21 1835  Weight: 85.3 kg    Examination:  General: A/O x4, negative acute respiratory distress Eyes: negative scleral hemorrhage, negative anisocoria, negative icterus ENT: Negative Runny nose, negative gingival bleeding, Neck:  Negative scars, masses, torticollis, lymphadenopathy, JVD Lungs: Clear to auscultation bilaterally, positive cough with speaking or deep inspiration.   Cardiovascular: Regular rate and rhythm without murmur gallop or rub normal S1 and S2 Abdomen: negative abdominal  pain, nondistended, positive soft, bowel sounds, no rebound, no ascites, no appreciable mass Extremities: No significant cyanosis, clubbing, or edema bilateral lower extremities Skin: Negative rashes, lesions, ulcers Psychiatric:  Negative depression, positive anxiety, negative fatigue, negative mania  Central nervous system:  Cranial nerves II through XII intact, tongue/uvula midline, all extremities muscle strength 5/5, sensation intact throughout, negative dysarthria, negative expressive aphasia, negative receptive aphasia.  .     Data Reviewed: Care during the described time interval was provided by me .  I have reviewed this patient's available data, including medical history, events of note, physical examination, and all test results as part of my evaluation.  CBC: Recent Labs  Lab 03/03/21 1358 03/03/21 1907 03/06/21 0057 03/07/21 0251 03/08/21 0037 03/09/21 0040 03/10/21 0503  WBC 17.6*   < > 16.2* 13.4* 13.0* 11.0* 11.7*  NEUTROABS 15.4*  --   --   --  11.7* 9.8* 10.3*  HGB 12.4*   < > 10.6* 10.7* 9.9* 10.1* 10.5*  HCT 38.4*   < > 32.6* 32.6* 30.0* 32.0* 33.6*  MCV 94.1   < > 94.2 95.3 94.0 95.0 94.6  PLT 333   < > 287 359 402* 450* 452*   < > = values in this interval not displayed.   Basic Metabolic Panel: Recent Labs  Lab 03/06/21 0057 03/07/21 0251 03/08/21 0037 03/09/21 0040 03/10/21 0503  NA 132* 136 138 138 138  K 3.4* 4.3 4.0 3.7 3.9  CL 99 102 104 102 103  CO2 21* '26 26 25 26  ' GLUCOSE 116* 149* 135* 142* 156*  BUN 15 19 27* 32* 29*  CREATININE 0.89 1.01 0.91 0.97 1.03  CALCIUM 8.1* 8.5* 8.3* 8.2* 8.2*  MG  --   --  2.1 2.0 2.1  PHOS  --   --  3.5 3.3 4.1   GFR: Estimated Creatinine Clearance: 77.6 mL/min (by C-G formula based on SCr of 1.03 mg/dL). Liver Function Tests: Recent Labs  Lab 03/06/21 0057 03/07/21 0251 03/08/21 0037 03/09/21 0040 03/10/21 0503  AST '25 23 26 28 23  ' ALT 47* 41 46* 50* 45*  ALKPHOS 159* 138* 122 110 97  BILITOT  1.3* 0.9 0.5 0.5 0.6  PROT 5.9* 6.3* 5.6* 5.6* 5.4*  ALBUMIN 2.1* 2.1* 2.0* 2.2* 2.2*   No results for input(s): LIPASE, AMYLASE in the last 168 hours. No results  for input(s): AMMONIA in the last 168 hours. Coagulation Profile: Recent Labs  Lab 03/03/21 1515  INR 1.1   Cardiac Enzymes: No results for input(s): CKTOTAL, CKMB, CKMBINDEX, TROPONINI in the last 168 hours. BNP (last 3 results) No results for input(s): PROBNP in the last 8760 hours. HbA1C: No results for input(s): HGBA1C in the last 72 hours. CBG: No results for input(s): GLUCAP in the last 168 hours. Lipid Profile: No results for input(s): CHOL, HDL, LDLCALC, TRIG, CHOLHDL, LDLDIRECT in the last 72 hours. Thyroid Function Tests: No results for input(s): TSH, T4TOTAL, FREET4, T3FREE, THYROIDAB in the last 72 hours. Anemia Panel: Recent Labs    03/09/21 0040 03/10/21 0503  FERRITIN 627* 577*   Sepsis Labs: Recent Labs  Lab 03/03/21 1358 03/06/21 0057 03/07/21 0251 03/08/21 0037  PROCALCITON  --  0.25 0.19 0.16  LATICACIDVEN 1.1  --   --   --     Recent Results (from the past 240 hour(s))  Culture, blood (routine x 2)     Status: Abnormal   Collection Time: 03/02/21 11:50 AM   Specimen: BLOOD  Result Value Ref Range Status   Specimen Description   Final    BLOOD LEFT ANTECUBITAL Performed at [facility], [address] [place], [place] [address]    Special Requests   Final    BOTTLES DRAWN AEROBIC AND ANAEROBIC Blood Culture adequate volume Performed at [facility], [address] [place], [place] [address]    Culture  Setup Time   Final    GRAM POSITIVE COCCI IN BOTH AEROBIC AND ANAEROBIC BOTTLES CRITICAL VALUE NOTED.  VALUE IS CONSISTENT WITH PREVIOUSLY REPORTED AND CALLED VALUE.    Culture (A)  Final    ENTEROCOCCUS FAECALIS SUSCEPTIBILITIES PERFORMED ON PREVIOUS CULTURE WITHIN THE LAST 5 DAYS. Performed at [facility], [place] [address] [place], [place]  [address]    Report Status 03/05/2021 FINAL  Final  Culture, blood (routine x 2)     Status: Abnormal   Collection Time: 03/02/21  1:10 PM   Specimen: Right Antecubital; Blood  Result Value Ref Range Status   Specimen Description   Final    RIGHT ANTECUBITAL BLOOD Performed at [facility], [place] [place], [place] [address]    Special Requests   Final    BOTTLES DRAWN AEROBIC AND ANAEROBIC Blood Culture adequate volume Performed at [facility], [address] [place], [place] [address]    Culture  Setup Time   Final    GRAM POSITIVE COCCI IN CHAINS IN BOTH AEROBIC AND ANAEROBIC BOTTLES CRITICAL RESULT CALLED TO, READ BACK BY AND VERIFIED WITH: Josph Macho RN '@1205'  03/03/21 EB Performed at [facility], [place] [address], [place], [place] [address]    Culture ENTEROCOCCUS FAECALIS (A)  Final   Report Status 03/05/2021 FINAL  Final   Organism ID, Bacteria ENTEROCOCCUS FAECALIS  Final      Susceptibility   Enterococcus faecalis - MIC*    AMPICILLIN <=2 SENSITIVE Sensitive     VANCOMYCIN 1 SENSITIVE Sensitive     GENTAMICIN SYNERGY SENSITIVE Sensitive     * ENTEROCOCCUS FAECALIS  Blood Culture ID Panel (Reflexed)     Status: Abnormal   Collection Time: 03/02/21  1:10 PM  Result Value Ref Range Status   Enterococcus faecalis DETECTED (A) NOT DETECTED Final    Comment: CRITICAL RESULT CALLED TO, READ BACK BY AND VERIFIED WITH:  S GOUGE RN '@1203'  03/03/21 EB    Enterococcus Faecium NOT DETECTED NOT DETECTED Final   Listeria monocytogenes NOT DETECTED NOT DETECTED Final   Staphylococcus species NOT DETECTED NOT DETECTED Final   Staphylococcus aureus (BCID) NOT DETECTED NOT DETECTED Final   Staphylococcus epidermidis NOT DETECTED NOT DETECTED Final   Staphylococcus lugdunensis NOT DETECTED NOT DETECTED Final   Streptococcus species NOT DETECTED NOT DETECTED Final   Streptococcus agalactiae NOT DETECTED NOT DETECTED Final   Streptococcus pneumoniae NOT  DETECTED NOT DETECTED Final   Streptococcus pyogenes NOT DETECTED NOT DETECTED Final   A.calcoaceticus-baumannii NOT DETECTED NOT DETECTED Final   Bacteroides fragilis NOT DETECTED NOT DETECTED Final   Enterobacterales NOT DETECTED NOT DETECTED Final   Enterobacter cloacae complex NOT DETECTED NOT DETECTED Final   Escherichia coli NOT DETECTED NOT DETECTED Final   Klebsiella aerogenes NOT DETECTED NOT DETECTED Final   Klebsiella oxytoca NOT DETECTED NOT DETECTED Final   Klebsiella pneumoniae NOT DETECTED NOT DETECTED Final   Proteus species NOT DETECTED NOT DETECTED Final   Salmonella species NOT DETECTED NOT DETECTED Final   Serratia marcescens NOT DETECTED NOT DETECTED Final   Haemophilus influenzae NOT DETECTED NOT DETECTED Final   Neisseria meningitidis NOT DETECTED NOT DETECTED Final   Pseudomonas aeruginosa NOT DETECTED NOT DETECTED Final   Stenotrophomonas maltophilia NOT DETECTED NOT DETECTED Final   Candida albicans NOT DETECTED NOT DETECTED Final   Candida auris NOT DETECTED NOT DETECTED Final   Candida glabrata NOT DETECTED NOT DETECTED Final   Candida krusei NOT DETECTED NOT DETECTED Final   Candida parapsilosis NOT DETECTED NOT DETECTED Final   Candida tropicalis NOT DETECTED NOT DETECTED Final   Cryptococcus neoformans/gattii NOT DETECTED NOT DETECTED Final   Vancomycin resistance NOT DETECTED NOT DETECTED Final    Comment: Performed at [facility], [address], [place], [place] [address]  Resp Panel by RT-PCR (Flu A&B, Covid) Nasopharyngeal Swab     Status: None   Collection Time: 03/02/21  3:41 PM   Specimen: Nasopharyngeal Swab; Nasopharyngeal(NP) swabs in vial transport medium  Result Value Ref Range Status   SARS Coronavirus 2 by RT PCR NEGATIVE NEGATIVE Final    Comment: (NOTE) SARS-CoV-2 target nucleic acids are NOT DETECTED.  The SARS-CoV-2 RNA is generally detectable in upper respiratory specimens during the acute phase of infection. The  lowest concentration of SARS-CoV-2 viral copies this assay can detect is 138 copies/mL. A negative result does not preclude SARS-Cov-2 infection and should not be used as the sole basis for treatment or other patient management decisions. A negative result may occur with  improper specimen collection/handling, submission of specimen other than nasopharyngeal swab, presence of viral mutation(s) within the areas targeted by this assay, and inadequate number of viral copies(<138 copies/mL). A negative result must be combined with clinical observations, patient history, and epidemiological information. The expected result is Negative.  Fact Sheet for Patients:  EntrepreneurPulse.com.au  Fact Sheet for Healthcare Providers:  IncredibleEmployment.be  This test is no t yet approved or cleared by the [place] FDA and  has been authorized for detection and/or diagnosis of SARS-CoV-2 by FDA under an Emergency Use Authorization (EUA). This EUA will remain  in effect (meaning this test can be used) for the duration of the COVID-19 declaration under Section 564(b)(1) of the Act, 21 U.S.C.section 360bbb-3(b)(1), unless the authorization is terminated  or revoked sooner.       Influenza A by PCR NEGATIVE NEGATIVE Final   Influenza B  by PCR NEGATIVE NEGATIVE Final    Comment: (NOTE) The Xpert Xpress SARS-CoV-2/FLU/RSV plus assay is intended as an aid in the diagnosis of influenza from Nasopharyngeal swab specimens and should not be used as a sole basis for treatment. Nasal washings and aspirates are unacceptable for Xpert Xpress SARS-CoV-2/FLU/RSV testing.  Fact Sheet for Patients: EntrepreneurPulse.com.au  Fact Sheet for Healthcare Providers: IncredibleEmployment.be  This test is not yet approved or cleared by the [place] FDA and has been authorized for detection and/or diagnosis of SARS-CoV-2 by FDA under  an Emergency Use Authorization (EUA). This EUA will remain in effect (meaning this test can be used) for the duration of the COVID-19 declaration under Section 564(b)(1) of the Act, 21 U.S.C. section 360bbb-3(b)(1), unless the authorization is terminated or revoked.  Performed at [facility], [address] [place], [place] [address]   Blood culture (routine x 2)     Status: None   Collection Time: 03/03/21  1:58 PM   Specimen: BLOOD  Result Value Ref Range Status   Specimen Description   Final    BLOOD LEFT ANTECUBITAL Performed at [place] [address], [place], [place] [address]    Special Requests   Final    BOTTLES DRAWN AEROBIC AND ANAEROBIC Blood Culture adequate volume Performed at [place] [address] [place], [place] [address]    Culture   Final    NO GROWTH 5 DAYS Performed at [facility], [place] [address] [place], [place] [address]    Report Status 03/08/2021 FINAL  Final  Resp Panel by RT-PCR (Flu A&B, Covid) Nasopharyngeal Swab     Status: Abnormal   Collection Time: 03/03/21  3:08 PM   Specimen: Nasopharyngeal Swab; Nasopharyngeal(NP) swabs in vial transport medium  Result Value Ref Range Status   SARS Coronavirus 2 by RT PCR POSITIVE (A) NEGATIVE Final    Comment: RESULT CALLED TO, READ BACK BY AND VERIFIED WITH: BOWEN,M. RN AT 1628 03/03/21 MULLINS,T (NOTE) SARS-CoV-2 target nucleic acids are DETECTED.  The SARS-CoV-2 RNA is generally detectable in upper respiratory specimens during the acute phase of infection. Positive results are indicative of the presence of the identified virus, but do not rule out bacterial infection or co-infection with other pathogens not detected by the test. Clinical correlation with patient history and other diagnostic information is necessary to determine patient infection status. The expected result is Negative.  Fact Sheet for  Patients: EntrepreneurPulse.com.au  Fact Sheet for Healthcare Providers: IncredibleEmployment.be  This test is not yet approved or cleared by the [place] FDA and  has been authorized for detection and/or diagnosis of SARS-CoV-2 by FDA under an Emergency Use Authorization (EUA).  This EUA will remain in effect (meaning this test c an be used) for the duration of  the COVID-19 declaration under Section 564(b)(1) of the Act, 21 U.S.C. section 360bbb-3(b)(1), unless the authorization is terminated or revoked sooner.     Influenza A by PCR NEGATIVE NEGATIVE Final   Influenza B by PCR NEGATIVE NEGATIVE Final    Comment: (NOTE) The Xpert Xpress SARS-CoV-2/FLU/RSV plus assay is intended as an aid in the diagnosis of influenza from Nasopharyngeal swab specimens and should not be used as a sole basis for treatment. Nasal washings and aspirates are unacceptable for Xpert Xpress SARS-CoV-2/FLU/RSV testing.  Fact Sheet for Patients: EntrepreneurPulse.com.au  Fact Sheet for Healthcare Providers: IncredibleEmployment.be  This test is not yet approved or cleared by the [place]  FDA and has been authorized for detection and/or diagnosis of SARS-CoV-2 by FDA under an Emergency Use Authorization (EUA). This EUA will remain in effect (meaning this test can be used) for the duration of the COVID-19 declaration under Section 564(b)(1) of the Act, 21 U.S.C. section 360bbb-3(b)(1), unless the authorization is terminated or revoked.  Performed at [facility], [place] [address], [place], [place] [address]   Urine culture     Status: None   Collection Time: 03/03/21  3:08 PM   Specimen: In/Out Cath Urine  Result Value Ref Range Status   Specimen Description   Final    IN/OUT CATH URINE Performed at [place] [address] [place], [place] [address]    Special Requests    Final    NONE Performed at [facility], [place] [address], [place], [place] [address]    Culture   Final    NO GROWTH Performed at [facility], [place] [address] [place], [place] [address]    Report Status 03/04/2021 FINAL  Final  Blood culture (routine x 2)     Status: None   Collection Time: 03/03/21  3:41 PM   Specimen: BLOOD  Result Value Ref Range Status   Specimen Description   Final    BLOOD RIGHT ANTECUBITAL Performed at [place] [address] [place], [place] [address]    Special Requests   Final    BOTTLES DRAWN AEROBIC AND ANAEROBIC Blood Culture adequate volume Performed at [place] [address], [place], [place] [address]    Culture   Final    NO GROWTH 5 DAYS Performed at [facility], [place] [address] [place],  [address]    Report Status 03/08/2021 FINAL  Final  MRSA PCR Screening     Status: Abnormal   Collection Time: 03/04/21 11:53 PM   Specimen: Nasopharyngeal  Result Value Ref Range Status   MRSA by PCR POSITIVE (A) NEGATIVE Final    Comment:        The GeneXpert MRSA Assay (FDA approved for NASAL specimens only), is one component of a comprehensive MRSA colonization surveillance program. It is not intended to diagnose MRSA infection nor to guide or monitor treatment for MRSA infections. RESULT CALLED TO, READ BACK BY AND VERIFIED WITH: [facility] RN AT 0121 03/05/2021 MITCHELL,L Performed at [facility], [place] [address], [place],  [address]          Radiology Studies: No results found.      Scheduled Meds: . vitamin C  500 mg Oral Daily  . cholecalciferol  1,000 Units Oral Daily  . enoxaparin (LOVENOX) injection  40 mg Subcutaneous Q24H  . feeding supplement  237 mL Oral BID BM  . Ipratropium-Albuterol  1 puff Inhalation Q6H  . levothyroxine  175 mcg Oral QAC breakfast  . mouth rinse  15 mL Mouth Rinse BID  . methylPREDNISolone  (SOLU-MEDROL) injection  60 mg Intravenous Q12H  . multivitamin with minerals  1 tablet Oral Daily  . pantoprazole  40 mg Oral Daily  . traZODone  50 mg Oral QHS  . zinc sulfate  220 mg Oral Daily   Continuous Infusions: . sodium chloride 10 mL/hr at 03/06/21 0000  . sodium chloride Stopped (03/05/21 0323)  . sodium chloride 20 mL/hr at 03/08/21 1933  . ampicillin (OMNIPEN) IV 2 g (03/10/21 0845)  . cefTRIAXone (ROCEPHIN)  IV 2 g (03/09/21 2056)  LOS: 7 days    Time spent:40 min    Swayzie Choate, Geraldo Docker, MD [facility]   If 7PM-7AM, please contact night-coverage 03/10/2021, 9:11 AM

## 2021-03-11 DIAGNOSIS — U071 COVID-19: Secondary | ICD-10-CM | POA: Diagnosis not present

## 2021-03-11 DIAGNOSIS — R7881 Bacteremia: Secondary | ICD-10-CM | POA: Diagnosis not present

## 2021-03-11 DIAGNOSIS — I059 Rheumatic mitral valve disease, unspecified: Secondary | ICD-10-CM | POA: Diagnosis not present

## 2021-03-11 DIAGNOSIS — A419 Sepsis, unspecified organism: Secondary | ICD-10-CM | POA: Diagnosis not present

## 2021-03-11 LAB — MAGNESIUM: Magnesium: 2.1 mg/dL (ref 1.7–2.4)

## 2021-03-11 LAB — COMPREHENSIVE METABOLIC PANEL
ALT: 46 U/L — ABNORMAL HIGH (ref 0–44)
AST: 23 U/L (ref 15–41)
Albumin: 2.5 g/dL — ABNORMAL LOW (ref 3.5–5.0)
Alkaline Phosphatase: 107 U/L (ref 38–126)
Anion gap: 8 (ref 5–15)
BUN: 25 mg/dL — ABNORMAL HIGH (ref 8–23)
CO2: 29 mmol/L (ref 22–32)
Calcium: 8.6 mg/dL — ABNORMAL LOW (ref 8.9–10.3)
Chloride: 99 mmol/L (ref 98–111)
Creatinine, Ser: 0.96 mg/dL (ref 0.61–1.24)
GFR, Estimated: >60 mL/min (ref >60)
Glucose, Bld: 105 mg/dL — ABNORMAL HIGH (ref 70–99)
Potassium: 3.8 mmol/L (ref 3.5–5.1)
Sodium: 136 mmol/L (ref 135–145)
Total Bilirubin: 0.6 mg/dL (ref 0.3–1.2)
Total Protein: 5.8 g/dL — ABNORMAL LOW (ref 6.5–8.1)

## 2021-03-11 LAB — CBC WITH DIFFERENTIAL/PLATELET
Abs Immature Granulocytes: 0.39 10*3/uL — ABNORMAL HIGH (ref 0.00–0.07)
Basophils Absolute: 0 10*3/uL (ref 0.0–0.1)
Basophils Relative: 0 %
Eosinophils Absolute: 0 10*3/uL (ref 0.0–0.5)
Eosinophils Relative: 0 %
HCT: 37.2 % — ABNORMAL LOW (ref 39.0–52.0)
Hemoglobin: 11.8 g/dL — ABNORMAL LOW (ref 13.0–17.0)
Immature Granulocytes: 3 %
Lymphocytes Relative: 16 %
Lymphs Abs: 2.3 10*3/uL (ref 0.7–4.0)
MCH: 30.4 pg (ref 26.0–34.0)
MCHC: 31.7 g/dL (ref 30.0–36.0)
MCV: 95.9 fL (ref 80.0–100.0)
Monocytes Absolute: 1 10*3/uL (ref 0.1–1.0)
Monocytes Relative: 8 %
Neutro Abs: 10.1 10*3/uL — ABNORMAL HIGH (ref 1.7–7.7)
Neutrophils Relative %: 73 %
Platelets: 468 10*3/uL — ABNORMAL HIGH (ref 150–400)
RBC: 3.88 MIL/uL — ABNORMAL LOW (ref 4.22–5.81)
RDW: 13.9 % (ref 11.5–15.5)
WBC: 13.9 10*3/uL — ABNORMAL HIGH (ref 4.0–10.5)
nRBC: 0.3 % — ABNORMAL HIGH (ref 0.0–0.2)

## 2021-03-11 LAB — PHOSPHORUS: Phosphorus: 2.9 mg/dL (ref 2.5–4.6)

## 2021-03-11 LAB — C-REACTIVE PROTEIN: CRP: 1.8 mg/dL — ABNORMAL HIGH (ref <1.0)

## 2021-03-11 LAB — LACTATE DEHYDROGENASE: LDH: 182 U/L (ref 98–192)

## 2021-03-11 LAB — D-DIMER, QUANTITATIVE: D-Dimer, Quant: 2.21 ug/mL-FEU — ABNORMAL HIGH (ref 0.00–0.50)

## 2021-03-11 LAB — FERRITIN: Ferritin: 570 ng/mL — ABNORMAL HIGH (ref 24–336)

## 2021-03-11 MED ORDER — FUROSEMIDE 10 MG/ML IJ SOLN
60.0000 mg | Freq: Once | INTRAMUSCULAR | Status: AC
  Administered 2021-03-11: 60 mg via INTRAVENOUS
  Filled 2021-03-11: qty 6

## 2021-03-11 MED ORDER — DIPHENHYDRAMINE HCL 25 MG PO CAPS
50.0000 mg | ORAL_CAPSULE | Freq: Once | ORAL | Status: AC
  Administered 2021-03-12: 50 mg via ORAL
  Filled 2021-03-11: qty 2

## 2021-03-11 MED ORDER — ENOXAPARIN SODIUM 80 MG/0.8ML ~~LOC~~ SOLN
80.0000 mg | Freq: Two times a day (BID) | SUBCUTANEOUS | Status: DC
  Administered 2021-03-11 – 2021-03-12 (×2): 80 mg via SUBCUTANEOUS
  Filled 2021-03-11 (×2): qty 0.8

## 2021-03-11 MED ORDER — ALBUMIN HUMAN 25 % IV SOLN
25.0000 g | Freq: Once | INTRAVENOUS | Status: AC
  Administered 2021-03-11: 25 g via INTRAVENOUS
  Filled 2021-03-11: qty 100

## 2021-03-11 MED ORDER — DIPHENHYDRAMINE HCL 50 MG/ML IJ SOLN: 50.0000 mg | Freq: Once | INTRAMUSCULAR | Status: DC

## 2021-03-11 MED ORDER — DIPHENHYDRAMINE HCL 25 MG PO CAPS: 50.0000 mg | ORAL_CAPSULE | Freq: Once | ORAL | Status: DC

## 2021-03-11 MED ORDER — PREDNISONE 5 MG PO TABS: 50.0000 mg | ORAL_TABLET | Freq: Once | ORAL | Status: DC

## 2021-03-11 MED ORDER — DIPHENHYDRAMINE HCL 50 MG/ML IJ SOLN: 50.0000 mg | Freq: Once | INTRAMUSCULAR | Status: AC

## 2021-03-11 MED ORDER — PREDNISONE 5 MG PO TABS
50.0000 mg | ORAL_TABLET | Freq: Once | ORAL | Status: AC
  Administered 2021-03-12: 50 mg via ORAL
  Filled 2021-03-11: qty 2

## 2021-03-11 NOTE — Progress Notes (Signed)
ANTICOAGULATION CONSULT NOTE - Initial Consult  Pharmacy Consult for enoxaparin Indication: PE treatment until CT Angio result   Allergies  Allergen Reactions  . Ceftin [Cefuroxime Axetil] Diarrhea  . Other Other (See Comments)  . Oxycodone Nausea And Vomiting  . Prednisone & Diphenhydramine     Other reaction(s): Unknown  . Iohexol     03/02/21 pt received prednisone and benadryl 1hr before CT Abd with contrast, tolerated w/o issue 03/10/17 Patient started sneezing during the injection and continued sneezing after exam. ER doc notified and patient was given benadryl and cortisone    Patient Measurements: Height: 6\' 1"  (185.4 cm) Weight: 85.3 kg (188 lb 0.8 oz) IBW/kg (Calculated) : 79.9    Vital Signs: Temp: 97.6 F (36.4 C) (04/17 1608) Temp Source: Oral (04/17 1608) BP: 145/78 (04/17 1608) Pulse Rate: 100 (04/17 1608)  Labs: Recent Labs    03/09/21 0040 03/10/21 0503 03/11/21 0355  HGB 10.1* 10.5* 11.8*  HCT 32.0* 33.6* 37.2*  PLT 450* 452* 468*  CREATININE 0.97 1.03 0.96    Estimated Creatinine Clearance: 83.2 mL/min (by C-G formula based on SCr of 0.96 mg/dL).   Medical History: Past Medical History:  Diagnosis Date  . Atrial fibrillation (HCC)   . BPH (benign prostatic hyperplasia)   . Cervical spine disease   . Chest pain 2007   none since  . Gallbladder disorder   . Hemopneumothorax on left   . Hiatal hernia   . Hypercholesteremia   . Hyperlipidemia   . Meniere disease   . Meniere's disease   . 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 D-dimer up from 0.9 to 2.2 with episodes of severe work of breathing.  Pharmacy consulted to increase enoxaparin to treatment dose until CT Angio results on 4/18.   Last received enoxaparin 40mg  4/16 at 21:22. Good renal function. H/H, plt stable, no reported bleeding.    Goal of Therapy:  Monitor platelets by anticoagulation protocol: Yes   Plan:  Start enoxaparin 80 mg Q12 hr F/u CT angio result and AC plan Monitor for signs/symptoms of bleeding    , PharmD, BCPS, BCCP Clinical Pharmacist  Please check AMION for all [facility] Pharmacy phone numbers After 10:00 PM, call Main Pharmacy [phone]

## 2021-03-11 NOTE — Progress Notes (Signed)
PROGRESS NOTE    Brandon [name]  VHQ:[id number] DOB: 09-Dec-1950 DOA: 03/03/2021 PCP: Kristen Loader, FNP     Brief Narrative:  Brandon [name] a 70 y.o.WM PMHx prior covid pna, hypothyroid, HLD   Presented to ED on 4/8 with complaints of fevers, sob, nonproductive cough. During workup, pt was found to have evidence suggesting RUL pna. Pt was recommended admission, however, pt declined and was ultimately discharged from the ED on augmentin and azithromycin. Blood cultures later returned pos for enterococcus and pt was asked to return to ED. Pt reports generalized weakness, increased cough, decreased exercise tolerance. Also reports fevers - in the ED, pt was found to be tachycardic with tachypnea. WBC noted to be 17.6. Blood cx from 4/8 confirmed enterococcus. ID was consulted and hospitalist requested for admission.  Patient work-up was significant for mitral valve endocarditis, he transferred to [facility] for further management, he was seen by ID, and CT surgery.  As well he is diagnosed with COVID-19 of pneumonia, he is vaccinated, and had previous infection last August.   Subjective: 4/17 afebrile overnight A/O x4 sitting in bed more comfortable than yesterday.  States still has episodes where it is difficult for him to breathe and the inhalers do not resolve his discomfort.  Today patient also has increased bilateral lower extremity edema to mid calf.   Assessment & Plan: Covid vaccination; vaccinated 3/3   Principal Problem:   Endocarditis of mitral valve Active Problems:   Sepsis ([place])   Bacteremia   COVID-19 virus infection   Osler node   Acute respiratory failure with hypoxia ([employer])   Pneumonia due to COVID-19 virus   CAP (community acquired pneumonia)   Elevated liver enzymes  Enterococcus bacteremia -Continue current antibiotics -4/15 discussed case with Dr. Tommy Medal ID he recommends that we do not start the clock on actual antibiotic treatment until valve  has been replaced/repaired.  After that patient will require 6 weeks of antibiotics.     Sepsis POA - Presented with fevers, leukocytosis, tachycardia in the setting of Enterococcus bacteremia and endocarditis - ID following,; currently on ampicillin and ceftriaxone  Acute hypoxic respiratory failure due to COVID-19 pneumonia/ CAP pneumonia  COVID-19 Labs  Recent Labs    03/09/21 0040 03/10/21 0503 03/11/21 0355  DDIMER 0.63* 0.93* 2.21*  FERRITIN 627* 577* 570*  LDH 157 163 182  CRP 6.3* 2.7* 1.8*    Lab Results  Component Value Date   SARSCOV2NAA POSITIVE (A) 03/03/2021   [place] NEGATIVE 03/02/2021   -Remdesivir per pharmacy protocol -Solu-Medrol 60 mg BID -Vitamins per COVID protocol -Incentive spirometry -Flutter valve -Combivent QID - Albuterol PRN -Given his endocarditis, and bacteremia, cannot receive baricitinib or Actemra -Continue current antibiotics which will cover both CAP and endocarditis -4/14 patient's respiratory status appears to be improving.   Pulmonary embolus? -4/17 given that patient is having episodes of increased WOB/SOB and his D-dimer has jumped significantly today will obtain CTA chest PE protocol  Endocarditis mitral valve -TTE remarkable for 1 x 1.5 cm vegetation on the mitral valve.  Cardiology consulted for TEE, timing per cardiology  -Continue antibiotics per ID recommendations. -4/13 repeat TEE on 4/14 -CT surgery consulted as well, further recommendation pending TEE and improvement of COVID-19.  Discussed with cardiology, plan for TEE on Thursday at 2 PM. -me an area on his finger that may represent an Osler's node as he has a tender area on his fingertip he also has a possible splinter as seen  on picture above. -Need to monitor for further stigmata in particular evidence of septic embolization on antibiotic therapy as this would be a reason for more urgent cardiothoracic surgery. Dr. Roxan Hockey with cardiothoracic surgery.  He  does not see an urgent indication for surgery at this point in time. -Decision will be made on treatment options after repeat TEE - 4/14 repeat TEE: Spoke at length with [facility] cardiology interventionalists, and [facility] cardiothoracic surgery, and after discussing case was determined we should wait until Wednesday to perform TEE.  At which point we will determine replace/repair valve.  Elevated AST/ALT/alk phos  -Appears to be somewhat chronic, mild, trending down-unclear baseline -Downtrending since admission likely shock liver in the setting of sepsis as well due to Covid. -2018 labs markedly elevated (AST439/ALT760/AlkP230)  -unremarkable work-up at that time; considered for outpatient follow-up for hemochromatosis versus autoimmune work-up but does not appear to be any documentation in our system that this was ever done -4/15 liver enzymes normalized  Hypothyroid - TSH 2.5 -Synthroid 175 mcg daily  Normocytic anemia - Likely hemodilutional. - 4/14 occult blood pending - No signs or symptoms of occult bleed. Lab Results  Component Value Date   HGB 11.8 (L) 03/11/2021   HGB 10.5 (L) 03/10/2021   HGB 10.1 (L) 03/09/2021   HGB 9.9 (L) 03/08/2021   HGB 10.7 (L) 03/07/2021  -Stable  Lower extremity edema -4/17 albumin 25 g x 1  + Lasix IV 60 mg x 1 (Administer albumin prior to Lasix) - 4/17 TED hose 12 hours on during the day: Remove at night     DVT prophylaxis: Lovenox---> 4/17 full dose Lovenox until CTA PE protocol results Code Status: Full Family Communication: 4/16 daughter at bedside for discussion of plan of care all questions answered Status is: Inpatient    Dispo: The patient is from: Home              Anticipated d/c is to: Home vs SNF              Anticipated d/c date is:??              Patient currently unstable      Consultants:  Dr. Roxan Hockey with cardiothoracic surgery ID Dr. Tommy Medal Cardiology Dr. Tommy Medal  ID  Procedures/Significant Events:    I have personally reviewed and interpreted all radiology studies and my findings are as above.  VENTILATOR SETTINGS: Nasal cannula 4/16 Flow 2 L/min SPO2: 94%     Cultures   Antimicrobials: Anti-infectives (From admission, onward)   Start     Ordered Stop   03/05/21 1000  remdesivir 100 mg in sodium chloride 0.9 % 100 mL IVPB       "Followed by" Linked Group Details   03/04/21 0035 03/09/21 0959   03/04/21 1000  remdesivir 100 mg in sodium chloride 0.9 % 100 mL IVPB  Status:  Discontinued       "Followed by" Linked Group Details   03/03/21 1806 03/04/21 0035   03/04/21 1000  remdesivir 200 mg in sodium chloride 0.9% 250 mL IVPB       "Followed by" Linked Group Details   03/04/21 0035 03/04/21 1230   03/03/21 2000  cefTRIAXone (ROCEPHIN) 2 g in sodium chloride 0.9 % 100 mL IVPB        03/03/21 1708     03/03/21 2000  remdesivir 200 mg in sodium chloride 0.9% 250 mL IVPB  Status:  Discontinued       "  Followed by" Linked Group Details   03/03/21 1806 03/04/21 0035   03/03/21 1600  ampicillin (OMNIPEN) 2 g in sodium chloride 0.9 % 100 mL IVPB        03/03/21 1525         Devices    LINES / TUBES:      Continuous Infusions: . sodium chloride 10 mL/hr at 03/06/21 0000  . sodium chloride Stopped (03/05/21 0323)  . sodium chloride 20 mL/hr at 03/08/21 1933  . ampicillin (OMNIPEN) IV 2 g (03/11/21 1244)  . cefTRIAXone (ROCEPHIN)  IV 2 g (03/11/21 1105)     Objective: Vitals:   03/11/21 0000 03/11/21 0423 03/11/21 0822 03/11/21 1226  BP: 124/74 132/78 140/72 (!) 142/84  Pulse: 80  100 99  Resp: _0 Temp: 97.8 F (36.6 C) 98 F (36.7 C) 98.6 F (37 C) 98 F (36.7 C)  TempSrc: Axillary Oral Oral Oral  SpO2: 93%  94% 96%  Weight:      Height:        Intake/Output Summary (Last 24 hours) at 03/11/2021 1332 Last data filed at 03/11/2021 1042 Gross per 24 hour  Intake 951 ml  Output 550 ml  Net 401 ml    Filed Weights   03/03/21 1835  Weight: 85.3 kg    Examination:  General: A/O x4, negative acute respiratory distress Eyes: negative scleral hemorrhage, negative anisocoria, negative icterus ENT: Negative Runny nose, negative gingival bleeding, Neck:  Negative scars, masses, torticollis, lymphadenopathy, JVD Lungs: Clear to auscultation bilaterally, positive cough with speaking or deep inspiration.   Cardiovascular: Regular rate and rhythm without murmur gallop or rub normal S1 and S2 Abdomen: negative abdominal pain, nondistended, positive soft, bowel sounds, no rebound, no ascites, no appreciable mass Extremities: bilateral lower extremity edema 2+ mid calf Skin: Negative rashes, lesions, ulcers Psychiatric:  Negative depression, positive anxiety, negative fatigue, negative mania  Central nervous system:  Cranial nerves II through XII intact, tongue/uvula midline, all extremities muscle strength 5/5, sensation intact throughout, negative dysarthria, negative expressive aphasia, negative receptive aphasia.  .     Data Reviewed: Care during the described time interval was provided by me .  I have reviewed this patient's available data, including medical history, events of note, physical examination, and all test results as part of my evaluation.  CBC: Recent Labs  Lab 03/07/21 0251 03/08/21 0037 03/09/21 0040 03/10/21 0503 03/11/21 0355  WBC 13.4* 13.0* 11.0* 11.7* 13.9*  NEUTROABS  --  11.7* 9.8* 10.3* 10.1*  HGB 10.7* 9.9* 10.1* 10.5* 11.8*  HCT 32.6* 30.0* 32.0* 33.6* 37.2*  MCV 95.3 94.0 95.0 94.6 95.9  PLT 359 402* 450* 452* 209*   Basic Metabolic Panel: Recent Labs  Lab 03/07/21 0251 03/08/21 0037 03/09/21 0040 03/10/21 0503 03/11/21 0355  NA 136 138 138 138 136  K 4.3 4.0 3.7 3.9 3.8  CL 102 104 102 103 99  CO2 _1 GLUCOSE 149* 135* 142* 156* 105*  BUN 19 27* 32* 29* 25*  CREATININE 1.01 0.91 0.97 1.03 0.96  CALCIUM 8.5* 8.3* 8.2* 8.2* 8.6*   MG  --  2.1 2.0 2.1 2.1  PHOS  --  3.5 3.3 4.1 2.9   GFR: Estimated Creatinine Clearance: 83.2 mL/min (by C-G formula based on SCr of 0.96 mg/dL). Liver Function Tests: Recent Labs  Lab 03/07/21 0251 03/08/21 0037 03/09/21 0040 03/10/21 0503 03/11/21 0355  AST _2 ALT 41 46* 50*  45* 46*  ALKPHOS 138* 122 110 97 107  BILITOT 0.9 0.5 0.5 0.6 0.6  PROT 6.3* 5.6* 5.6* 5.4* 5.8*  ALBUMIN 2.1* 2.0* 2.2* 2.2* 2.5*   No results for input(s): LIPASE, AMYLASE in the last 168 hours. No results for input(s): AMMONIA in the last 168 hours. Coagulation Profile: No results for input(s): INR, PROTIME in the last 168 hours. Cardiac Enzymes: No results for input(s): CKTOTAL, CKMB, CKMBINDEX, TROPONINI in the last 168 hours. BNP (last 3 results) No results for input(s): PROBNP in the last 8760 hours. HbA1C: No results for input(s): HGBA1C in the last 72 hours. CBG: No results for input(s): GLUCAP in the last 168 hours. Lipid Profile: No results for input(s): CHOL, HDL, LDLCALC, TRIG, CHOLHDL, LDLDIRECT in the last 72 hours. Thyroid Function Tests: No results for input(s): TSH, T4TOTAL, FREET4, T3FREE, THYROIDAB in the last 72 hours. Anemia Panel: Recent Labs    03/10/21 0503 03/11/21 0355  FERRITIN 577* 570*   Sepsis Labs: Recent Labs  Lab 03/06/21 0057 03/07/21 0251 03/08/21 0037  PROCALCITON 0.25 0.19 0.16    Recent Results (from the past 240 hour(s))  Culture, blood (routine x 2)     Status: Abnormal   Collection Time: 03/02/21 11:50 AM   Specimen: BLOOD  Result Value Ref Range Status   Specimen Description   Final    BLOOD LEFT ANTECUBITAL Performed at [facility], [address] [place], [place] [address]    Special Requests   Final    BOTTLES DRAWN AEROBIC AND ANAEROBIC Blood Culture adequate volume Performed at [facility], [address] [place], [place] [address]    Culture  Setup Time   Final    GRAM POSITIVE COCCI IN  BOTH AEROBIC AND ANAEROBIC BOTTLES CRITICAL VALUE NOTED.  VALUE IS CONSISTENT WITH PREVIOUSLY REPORTED AND CALLED VALUE.    Culture (A)  Final    ENTEROCOCCUS FAECALIS SUSCEPTIBILITIES PERFORMED ON PREVIOUS CULTURE WITHIN THE LAST 5 DAYS. Performed at [facility], [place] [address], [place], [place] [address]    Report Status 03/05/2021 FINAL  Final  Culture, blood (routine x 2)     Status: Abnormal   Collection Time: 03/02/21  1:10 PM   Specimen: Right Antecubital; Blood  Result Value Ref Range Status   Specimen Description   Final    RIGHT ANTECUBITAL BLOOD Performed at [facility], [address] [place], [place] [address]    Special Requests   Final    BOTTLES DRAWN AEROBIC AND ANAEROBIC Blood Culture adequate volume Performed at [facility], [address] [place], [place] [address]    Culture  Setup Time   Final    GRAM POSITIVE COCCI IN CHAINS IN BOTH AEROBIC AND ANAEROBIC BOTTLES CRITICAL RESULT CALLED TO, READ BACK BY AND VERIFIED WITH: Josph Macho RN _0  03/03/21 EB Performed at [facility], [place] [address] [place], [place] [address]    Culture ENTEROCOCCUS FAECALIS (A)  Final   Report Status 03/05/2021 FINAL  Final   Organism ID, Bacteria ENTEROCOCCUS FAECALIS  Final      Susceptibility   Enterococcus faecalis - MIC*    AMPICILLIN <=2 SENSITIVE Sensitive     VANCOMYCIN 1 SENSITIVE Sensitive     GENTAMICIN SYNERGY SENSITIVE Sensitive     * ENTEROCOCCUS FAECALIS  Blood Culture ID Panel (Reflexed)     Status: Abnormal   Collection Time: 03/02/21  1:10 PM  Result Value Ref Range Status   Enterococcus faecalis DETECTED (A) NOT DETECTED Final    Comment: CRITICAL RESULT CALLED TO, READ BACK BY AND VERIFIED WITH: S GOUGE RN _0  03/03/21 EB    Enterococcus Faecium NOT DETECTED NOT DETECTED Final   Listeria monocytogenes NOT DETECTED NOT DETECTED Final   Staphylococcus species NOT DETECTED NOT DETECTED Final   Staphylococcus aureus  (BCID) NOT DETECTED NOT DETECTED Final   Staphylococcus epidermidis NOT DETECTED NOT DETECTED Final   Staphylococcus lugdunensis NOT DETECTED NOT DETECTED Final   Streptococcus species NOT DETECTED NOT DETECTED Final   Streptococcus agalactiae NOT DETECTED NOT DETECTED Final   Streptococcus pneumoniae NOT DETECTED NOT DETECTED Final   Streptococcus pyogenes NOT DETECTED NOT DETECTED Final   A.calcoaceticus-baumannii NOT DETECTED NOT DETECTED Final   Bacteroides fragilis NOT DETECTED NOT DETECTED Final   Enterobacterales NOT DETECTED NOT DETECTED Final   Enterobacter cloacae complex NOT DETECTED NOT DETECTED Final   Escherichia coli NOT DETECTED NOT DETECTED Final   Klebsiella aerogenes NOT DETECTED NOT DETECTED Final   Klebsiella oxytoca NOT DETECTED NOT DETECTED Final   Klebsiella pneumoniae NOT DETECTED NOT DETECTED Final   Proteus species NOT DETECTED NOT DETECTED Final   Salmonella species NOT DETECTED NOT DETECTED Final   Serratia marcescens NOT DETECTED NOT DETECTED Final   Haemophilus influenzae NOT DETECTED NOT DETECTED Final   Neisseria meningitidis NOT DETECTED NOT DETECTED Final   Pseudomonas aeruginosa NOT DETECTED NOT DETECTED Final   Stenotrophomonas maltophilia NOT DETECTED NOT DETECTED Final   Candida albicans NOT DETECTED NOT DETECTED Final   Candida auris NOT DETECTED NOT DETECTED Final   Candida glabrata NOT DETECTED NOT DETECTED Final   Candida krusei NOT DETECTED NOT DETECTED Final   Candida parapsilosis NOT DETECTED NOT DETECTED Final   Candida tropicalis NOT DETECTED NOT DETECTED Final   Cryptococcus neoformans/gattii NOT DETECTED NOT DETECTED Final   Vancomycin resistance NOT DETECTED NOT DETECTED Final    Comment: Performed at [facility], [address], [place], [place] [address]  Resp Panel by RT-PCR (Flu A&B, Covid) Nasopharyngeal Swab     Status: None   Collection Time: 03/02/21  3:41 PM   Specimen: Nasopharyngeal Swab; Nasopharyngeal(NP) swabs in  vial transport medium  Result Value Ref Range Status   SARS Coronavirus 2 by RT PCR NEGATIVE NEGATIVE Final    Comment: (NOTE) SARS-CoV-2 target nucleic acids are NOT DETECTED.  The SARS-CoV-2 RNA is generally detectable in upper respiratory specimens during the acute phase of infection. The lowest concentration of SARS-CoV-2 viral copies this assay can detect is 138 copies/mL. A negative result does not preclude SARS-Cov-2 infection and should not be used as the sole basis for treatment or other patient management decisions. A negative result may occur with  improper specimen collection/handling, submission of specimen other than nasopharyngeal swab, presence of viral mutation(s) within the areas targeted by this assay, and inadequate number of viral copies(<138 copies/mL). A negative result must be combined with clinical observations, patient history, and epidemiological information. The expected result is Negative.  Fact Sheet for Patients:  EntrepreneurPulse.com.au  Fact Sheet for Healthcare Providers:  IncredibleEmployment.be  This test is no t yet approved or cleared by the [place] FDA and  has been authorized for detection and/or diagnosis of SARS-CoV-2 by FDA under an Emergency Use Authorization (EUA). This EUA will remain  in effect (meaning this test can be used) for the duration of the COVID-19 declaration under Section 564(b)(1) of the Act, 21  U.S.C.section 360bbb-3(b)(1), unless the authorization is terminated  or revoked sooner.       Influenza A by PCR NEGATIVE NEGATIVE Final   Influenza B by PCR NEGATIVE NEGATIVE Final    Comment: (NOTE) The Xpert Xpress SARS-CoV-2/FLU/RSV plus assay is intended as an aid in the diagnosis of influenza from Nasopharyngeal swab specimens and should not be used as a sole basis for treatment. Nasal washings and aspirates are unacceptable for Xpert Xpress SARS-CoV-2/FLU/RSV testing.  Fact  Sheet for Patients: EntrepreneurPulse.com.au  Fact Sheet for Healthcare Providers: IncredibleEmployment.be  This test is not yet approved or cleared by the [place] FDA and has been authorized for detection and/or diagnosis of SARS-CoV-2 by FDA under an Emergency Use Authorization (EUA). This EUA will remain in effect (meaning this test can be used) for the duration of the COVID-19 declaration under Section 564(b)(1) of the Act, 21 U.S.C. section 360bbb-3(b)(1), unless the authorization is terminated or revoked.  Performed at [facility], [address] [place], [place] [address]   Blood culture (routine x 2)     Status: None   Collection Time: 03/03/21  1:58 PM   Specimen: BLOOD  Result Value Ref Range Status   Specimen Description   Final    BLOOD LEFT ANTECUBITAL Performed at [facility] [address], [place], [place] [address]    Special Requests   Final    BOTTLES DRAWN AEROBIC AND ANAEROBIC Blood Culture adequate volume Performed at [place] [address], [place], [place] [address]    Culture   Final    NO GROWTH 5 DAYS Performed at [facility], [place] [address] [place], [place] [address]    Report Status 03/08/2021 FINAL  Final  Resp Panel by RT-PCR (Flu A&B, Covid) Nasopharyngeal Swab     Status: Abnormal   Collection Time: 03/03/21  3:08 PM   Specimen: Nasopharyngeal Swab; Nasopharyngeal(NP) swabs in vial transport medium  Result Value Ref Range Status   SARS Coronavirus 2 by RT PCR POSITIVE (A) NEGATIVE Final    Comment: RESULT CALLED TO, READ BACK BY AND VERIFIED WITH: BOWEN,M. RN AT 1628 03/03/21 MULLINS,T (NOTE) SARS-CoV-2 target nucleic acids are DETECTED.  The SARS-CoV-2 RNA is generally detectable in upper respiratory specimens during the acute phase of infection. Positive results are indicative of the presence of the identified virus, but do not  rule out bacterial infection or co-infection with other pathogens not detected by the test. Clinical correlation with patient history and other diagnostic information is necessary to determine patient infection status. The expected result is Negative.  Fact Sheet for Patients: EntrepreneurPulse.com.au  Fact Sheet for Healthcare Providers: IncredibleEmployment.be  This test is not yet approved or cleared by the [place] FDA and  has been authorized for detection and/or diagnosis of SARS-CoV-2 by FDA under an Emergency Use Authorization (EUA).  This EUA will remain in effect (meaning this test c an be used) for the duration of  the COVID-19 declaration under Section 564(b)(1) of the Act, 21 U.S.C. section 360bbb-3(b)(1), unless the authorization is terminated or revoked sooner.     Influenza A by PCR NEGATIVE NEGATIVE Final   Influenza B by PCR NEGATIVE NEGATIVE Final    Comment: (NOTE) The Xpert Xpress SARS-CoV-2/FLU/RSV plus assay is intended as an aid in the diagnosis of influenza from Nasopharyngeal swab specimens and should not be used as a sole basis for treatment. Nasal washings and aspirates are unacceptable for Xpert Xpress  SARS-CoV-2/FLU/RSV testing.  Fact Sheet for Patients: EntrepreneurPulse.com.au  Fact Sheet for Healthcare Providers: IncredibleEmployment.be  This test is not yet approved or cleared by the [place] FDA and has been authorized for detection and/or diagnosis of SARS-CoV-2 by FDA under an Emergency Use Authorization (EUA). This EUA will remain in effect (meaning this test can be used) for the duration of the COVID-19 declaration under Section 564(b)(1) of the Act, 21 U.S.C. section 360bbb-3(b)(1), unless the authorization is terminated or revoked.  Performed at [facility], [place] [address] [place], [place] [address]   Urine culture     Status:  None   Collection Time: 03/03/21  3:08 PM   Specimen: In/Out Cath Urine  Result Value Ref Range Status   Specimen Description   Final    IN/OUT CATH URINE Performed at [facility] [address], [place], [place] [address]    Special Requests   Final    NONE Performed at [facility], [place] [address], [place], [place] [address]    Culture   Final    NO GROWTH Performed at [facility], [place] [address] [place], [place] [address]    Report Status 03/04/2021 FINAL  Final  Blood culture (routine x 2)     Status: None   Collection Time: 03/03/21  3:41 PM   Specimen: BLOOD  Result Value Ref Range Status   Specimen Description   Final    BLOOD RIGHT ANTECUBITAL Performed at [place] [address], [place], [place] [address]    Special Requests   Final    BOTTLES DRAWN AEROBIC AND ANAEROBIC Blood Culture adequate volume Performed at [facility] [address], [place], [place] [address]    Culture   Final    NO GROWTH 5 DAYS Performed at [facility], [place] [address] [place] [address], [place], [place] [address]    Report Status 03/08/2021 FINAL  Final  MRSA PCR Screening     Status: Abnormal   Collection Time: 03/04/21 11:53 PM   Specimen: Nasopharyngeal  Result Value Ref Range Status   MRSA by PCR POSITIVE (A) NEGATIVE Final    Comment:        The GeneXpert MRSA Assay (FDA approved for NASAL specimens only), is one component of a comprehensive MRSA colonization surveillance program. It is not intended to diagnose MRSA infection nor to guide or monitor treatment for MRSA infections. RESULT CALLED TO, READ BACK BY AND VERIFIED WITH: [facility] RN AT 0121 03/05/2021 MITCHELL,L Performed at [facility], [place] [address], [place],  [address]          Radiology Studies: No results found.      Scheduled Meds: . vitamin C  500 mg Oral Daily  . cholecalciferol  1,000 Units Oral  Daily  . enoxaparin (LOVENOX) injection  40 mg Subcutaneous Q24H  . feeding supplement  237 mL Oral BID BM  . Ipratropium-Albuterol  1 puff Inhalation Q6H  . levothyroxine  175 mcg Oral QAC breakfast  . mouth rinse  15 mL Mouth Rinse BID  . methylPREDNISolone (SOLU-MEDROL) injection  60 mg Intravenous Q12H  . multivitamin with minerals  1 tablet Oral Daily  . pantoprazole  40 mg Oral Daily  . traZODone  50 mg Oral QHS  . zinc sulfate  220 mg Oral Daily   Continuous Infusions: . sodium chloride 10 mL/hr at 03/06/21 0000  . sodium chloride Stopped (03/05/21 0323)  .  sodium chloride 20 mL/hr at 03/08/21 1933  . ampicillin (OMNIPEN) IV 2 g (03/11/21 1244)  . cefTRIAXone (ROCEPHIN)  IV 2 g (03/11/21 1105)     LOS: 8 days    Time spent:40 min    Briteny Fulghum, Geraldo Docker, MD [facility]   If 7PM-7AM, please contact night-coverage 03/11/2021, 1:32 PM

## 2021-03-12 ENCOUNTER — Inpatient Hospital Stay: Payer: Self-pay

## 2021-03-12 ENCOUNTER — Inpatient Hospital Stay (HOSPITAL_COMMUNITY): Payer: Medicare Other

## 2021-03-12 DIAGNOSIS — J90 Pleural effusion, not elsewhere classified: Secondary | ICD-10-CM

## 2021-03-12 DIAGNOSIS — A419 Sepsis, unspecified organism: Secondary | ICD-10-CM | POA: Diagnosis not present

## 2021-03-12 DIAGNOSIS — R7881 Bacteremia: Secondary | ICD-10-CM | POA: Diagnosis not present

## 2021-03-12 DIAGNOSIS — I059 Rheumatic mitral valve disease, unspecified: Secondary | ICD-10-CM | POA: Diagnosis not present

## 2021-03-12 DIAGNOSIS — U071 COVID-19: Secondary | ICD-10-CM | POA: Diagnosis not present

## 2021-03-12 LAB — PHOSPHORUS: Phosphorus: 5.6 mg/dL — ABNORMAL HIGH (ref 2.5–4.6)

## 2021-03-12 LAB — CBC WITH DIFFERENTIAL/PLATELET
Abs Immature Granulocytes: 0.16 10*3/uL — ABNORMAL HIGH (ref 0.00–0.07)
Basophils Absolute: 0 10*3/uL (ref 0.0–0.1)
Basophils Relative: 0 %
Eosinophils Absolute: 0 10*3/uL (ref 0.0–0.5)
Eosinophils Relative: 0 %
HCT: 35 % — ABNORMAL LOW (ref 39.0–52.0)
Hemoglobin: 11.2 g/dL — ABNORMAL LOW (ref 13.0–17.0)
Immature Granulocytes: 2 %
Lymphocytes Relative: 8 %
Lymphs Abs: 0.8 10*3/uL (ref 0.7–4.0)
MCH: 30.4 pg (ref 26.0–34.0)
MCHC: 32 g/dL (ref 30.0–36.0)
MCV: 94.9 fL (ref 80.0–100.0)
Monocytes Absolute: 0.2 10*3/uL (ref 0.1–1.0)
Monocytes Relative: 2 %
Neutro Abs: 8.8 10*3/uL — ABNORMAL HIGH (ref 1.7–7.7)
Neutrophils Relative %: 88 %
Platelets: 428 10*3/uL — ABNORMAL HIGH (ref 150–400)
RBC: 3.69 MIL/uL — ABNORMAL LOW (ref 4.22–5.81)
RDW: 14.2 % (ref 11.5–15.5)
WBC: 10 10*3/uL (ref 4.0–10.5)
nRBC: 0 % (ref 0.0–0.2)

## 2021-03-12 LAB — COMPREHENSIVE METABOLIC PANEL
ALT: 38 U/L (ref 0–44)
AST: 19 U/L (ref 15–41)
Albumin: 2.8 g/dL — ABNORMAL LOW (ref 3.5–5.0)
Alkaline Phosphatase: 92 U/L (ref 38–126)
Anion gap: 7 (ref 5–15)
BUN: 22 mg/dL (ref 8–23)
CO2: 33 mmol/L — ABNORMAL HIGH (ref 22–32)
Calcium: 8.9 mg/dL (ref 8.9–10.3)
Chloride: 100 mmol/L (ref 98–111)
Creatinine, Ser: 1.14 mg/dL (ref 0.61–1.24)
GFR, Estimated: >60 mL/min (ref >60)
Glucose, Bld: 138 mg/dL — ABNORMAL HIGH (ref 70–99)
Potassium: 4.7 mmol/L (ref 3.5–5.1)
Sodium: 140 mmol/L (ref 135–145)
Total Bilirubin: 1 mg/dL (ref 0.3–1.2)
Total Protein: 6.1 g/dL — ABNORMAL LOW (ref 6.5–8.1)

## 2021-03-12 LAB — C-REACTIVE PROTEIN: CRP: 7.4 mg/dL — ABNORMAL HIGH (ref <1.0)

## 2021-03-12 LAB — FERRITIN: Ferritin: 464 ng/mL — ABNORMAL HIGH (ref 24–336)

## 2021-03-12 LAB — D-DIMER, QUANTITATIVE: D-Dimer, Quant: 1.36 ug/mL-FEU — ABNORMAL HIGH (ref 0.00–0.50)

## 2021-03-12 LAB — LACTATE DEHYDROGENASE: LDH: 157 U/L (ref 98–192)

## 2021-03-12 LAB — MAGNESIUM: Magnesium: 2.1 mg/dL (ref 1.7–2.4)

## 2021-03-12 MED ORDER — ALBUMIN HUMAN 25 % IV SOLN
50.0000 g | Freq: Once | INTRAVENOUS | Status: AC
  Administered 2021-03-12: 50 g via INTRAVENOUS
  Filled 2021-03-12: qty 200

## 2021-03-12 MED ORDER — IOHEXOL 350 MG/ML SOLN
100.0000 mL | Freq: Once | INTRAVENOUS | Status: AC | PRN
  Administered 2021-03-12: 69 mL via INTRAVENOUS

## 2021-03-12 MED ORDER — ENOXAPARIN SODIUM 40 MG/0.4ML ~~LOC~~ SOLN
40.0000 mg | SUBCUTANEOUS | Status: DC
  Administered 2021-03-13 – 2021-03-15 (×3): 40 mg via SUBCUTANEOUS
  Filled 2021-03-12 (×4): qty 0.4

## 2021-03-12 MED ORDER — FUROSEMIDE 40 MG PO TABS
40.0000 mg | ORAL_TABLET | Freq: Every day | ORAL | Status: DC
  Administered 2021-03-12: 40 mg via ORAL
  Filled 2021-03-12: qty 1

## 2021-03-12 MED ORDER — FUROSEMIDE 10 MG/ML IJ SOLN
60.0000 mg | Freq: Two times a day (BID) | INTRAMUSCULAR | Status: AC
  Administered 2021-03-12 – 2021-03-13 (×2): 60 mg via INTRAVENOUS
  Filled 2021-03-12 (×2): qty 6

## 2021-03-12 NOTE — Progress Notes (Signed)
Nutrition Follow-up  DOCUMENTATION CODES:   Not applicable  INTERVENTION:    Ensure Enlive po BID, each supplement provides 350 kcal and 20 grams of protein  MVI daily   NUTRITION DIAGNOSIS:   Increased nutrient needs related to acute illness as evidenced by estimated needs.  Ongoing  GOAL:   Patient will meet greater than or equal to 90% of their needs   Met  MONITOR:   PO intake,Supplement acceptance,Weight trends,Labs,I & O's  REASON FOR ASSESSMENT:   Malnutrition Screening Tool    ASSESSMENT:   Patient with PMH significant for prior COVID PNA and HLD. Presents this admission with endocarditis with questionable underlying PNA.   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  Patient reports increased LE swelling today. He says his appetite has improved and he has been eating most all of his meals for the past 2 days. He has been drinking Ensure supplements BID.   Labs reviewed. Phos 5.6  Medications reviewed and include vitamin D3, vitamin C, Lasix, solumedrol, MVI with minerals, zinc sulfate, protonix, IV ampicillin, IV ceftriaxone.   Diet Order:   Diet Order            Diet Heart Room service appropriate? Yes; Fluid consistency: Thin  Diet effective now                 EDUCATION NEEDS:   Education needs have been addressed  Skin:  Skin Assessment: Reviewed RN Assessment  Last BM:  4/17  Height:   Ht Readings from Last 1 Encounters:  03/03/21 '6\' 1"'  (1.854 m)    Weight:   Wt Readings from Last 1 Encounters:  03/03/21 85.3 kg   BMI:  Body mass index is 24.81 kg/m.  Estimated Nutritional Needs:   Kcal:  2200-2400 kcal  Protein:  115-130 grams  Fluid:  >/= 2 L/day   Lucas Mallow, RD, LDN, CNSC Please refer to Amion for contact information.

## 2021-03-12 NOTE — Progress Notes (Signed)
PROGRESS NOTE    [name]  KCL:[id number] DOB: [birth year] DOA: 03/03/2021 PCP: Kristen Loader, FNP     Brief Narrative:  Brandon Gloster Nelsenis a 70 y.o.WM PMHx prior covid pna, hypothyroid, HLD   Presented to ED on 4/8 with complaints of fevers, sob, nonproductive cough. During workup, pt was found to have evidence suggesting RUL pna. Pt was recommended admission, however, pt declined and was ultimately discharged from the ED on augmentin and azithromycin. Blood cultures later returned pos for enterococcus and pt was asked to return to ED. Pt reports generalized weakness, increased cough, decreased exercise tolerance. Also reports fevers - in the ED, pt was found to be tachycardic with tachypnea. WBC noted to be 17.6. Blood cx from 4/8 confirmed enterococcus. ID was consulted and hospitalist requested for admission.  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  As well he is diagnosed with COVID-19 of pneumonia, he is vaccinated, and had previous infection last August.   Subjective: 4/18 Afebrile overnight, A/O x4, sitting comfortably in bed.  Positive cough (most likely secondary to pleuritic irritation from bilateral pleural effusion), negative nausea.  Negative vomiting.   Assessment & Plan: Covid vaccination; vaccinated 3/3   Principal Problem:   Endocarditis of mitral valve Active Problems:   Sepsis ([place])   Bacteremia   COVID-19 virus infection   Osler node   Acute respiratory failure with hypoxia ([place])   Pneumonia due to COVID-19 virus   CAP (community acquired pneumonia)   Elevated liver enzymes  Enterococcus bacteremia -Continue current antibiotics -4/15 discussed case with Dr. Tommy Medal ID he recommends that we do not start the clock on actual antibiotic treatment until valve has been replaced/repaired.  After that patient will require 6 weeks of  antibiotics.     Sepsis POA - Presented with fevers, leukocytosis, tachycardia in the setting of Enterococcus bacteremia and endocarditis - ID following,; currently on ampicillin and ceftriaxone  Acute hypoxic respiratory failure due to COVID-19 pneumonia/ CAP pneumonia  COVID-19 Labs  Recent Labs    03/10/21 0503 03/11/21 0355 03/12/21 0825  DDIMER 0.93* 2.21* 1.36*  FERRITIN 577* 570* 464*  LDH 163 182 157  CRP 2.7* 1.8* 7.4*    Lab Results  Component Value Date   SARSCOV2NAA POSITIVE (A) 03/03/2021   [place] NEGATIVE 03/02/2021  -Remdesivir per pharmacy protocol -Solu-Medrol 60 mg BID -Vitamins per COVID protocol -Incentive spirometry -Flutter valve -Combivent QID - Albuterol PRN -Given his endocarditis, and bacteremia, cannot receive baricitinib or Actemra -Continue current antibiotics which will cover both CAP and endocarditis -4/14 patient's respiratory status appears to be improving.   Pulmonary embolus? -4/17 given that patient is having episodes of increased WOB/SOB and his D-dimer has jumped significantly today will obtain CTA chest PE protocol -4/18 CTA PE protocol from 4/17 negative for PE  Bilateral pleural effusion - 4/18 large bilateral pleural effusions. - 4/18 Albumin 50 g + Lasix IV 60 mg: Administer albumin prior to Lasix - 4/18 strict in and out - Daily weight - Place Foley - Continuous pulse ox - Telemetry - We will continue to diurese aggressively: Scheduled for thoracentesis by IR when patient goes down on Wednesday 4/20 for his TEE  Endocarditis mitral valve -TTE remarkable for 1 x 1.5 cm vegetation on the mitral valve.  Cardiology consulted for TEE, timing per cardiology  -Continue antibiotics per ID recommendations. -4/13 repeat TEE on 4/14 -  CT surgery consulted as well, further recommendation pending TEE and improvement of COVID-19.  Discussed with cardiology, plan for TEE on Thursday at 2 PM. -me an area on his finger that may  represent an Osler's node as he has a tender area on his fingertip he also has a possible splinter as seen on picture above. -Need to monitor for further stigmata in particular evidence of septic embolization on antibiotic therapy as this would be a reason for more urgent cardiothoracic surgery. Dr. Roxan Hockey with cardiothoracic surgery.  He does not see an urgent indication for surgery at this point in time. -Decision will be made on treatment options after repeat TEE - 4/14 repeat TEE: Spoke at length with [facility] cardiology interventionalists, and PA [facility] cardiothoracic surgery, and after discussing case was determined we should wait until Wednesday to perform TEE.  At which point we will determine replace/repair valve. -4/18 discussed case with Dr. Carlyle Basques, ID given patient's severe lung injury secondary to COVID she recommends that any MV repair/replacement only occur after 6 weeks of IV antibiotics.  She states will speak with cardiothoracic surgery. -4/18 PICC line ordered  Elevated AST/ALT/alk phos  -Appears to be somewhat chronic, mild, trending down-unclear baseline -Downtrending since admission likely shock liver in the setting of sepsis as well due to Covid. -2018 labs markedly elevated (AST439/ALT760/[id number])  -unremarkable work-up at that time; considered for outpatient follow-up for hemochromatosis versus autoimmune work-up but does not appear to be any documentation in our system that this was ever done -4/15 liver enzymes normalized  Hypothyroid - TSH 2.5 -Synthroid 175 mcg daily  Normocytic anemia - Likely hemodilutional. - 4/14 occult blood pending - No signs or symptoms of occult bleed. Lab Results  Component Value Date   HGB 11.2 (L) 03/12/2021   HGB 11.8 (L) 03/11/2021   HGB 10.5 (L) 03/10/2021   HGB 10.1 (L) 03/09/2021   HGB 9.9 (L) 03/08/2021  -Stable  Lower extremity edema -4/17 albumin 25 g x 1  + Lasix IV 60 mg x 1 (Administer  albumin prior to Lasix) - 4/17 TED hose 12 hours on during the day: Remove at night - See pleural effusion     DVT prophylaxis: Lovenox Code Status: Full Family Communication: 4/16 daughter at bedside for discussion of plan of care all questions answered Status is: Inpatient    Dispo: The patient is from: Home              Anticipated d/c is to: Home vs SNF              Anticipated d/c date is:??              Patient currently unstable      Consultants:  Dr. Roxan Hockey with cardiothoracic surgery ID Dr. Tommy Medal Cardiology Dr. Lucianne Lei Dam/Dr. Carlyle Basques ID  Procedures/Significant Events:  4/17 CTA PE protocol:-Extensive bilateral airspace disease, particularly in the mid and upper lung fields most compatible with pneumonia or ARDS. -Large bilateral pleural effusions. -Negative PE -Cardiomegaly.    I have personally reviewed and interpreted all radiology studies and my findings are as above.  VENTILATOR SETTINGS: Room air 4/18 SPO2: 95%     Cultures 4/8 blood LEFT AC positive Enterococcus faecalis 4/8 caps RIGHT AC positive Enterococcus faecalis 4/9 SARS coronavirus positive 4/9 influenza A/B negative 4/9 blood LEFT AC negative final 4/9 RIGHT AC negative final 4/10 MRSA by PCR positive  4/10 acute hepatitis panel negative 4/10 HIV screen negative  Antimicrobials: Anti-infectives (From admission, onward)   Start     Ordered Stop   03/05/21 1000  remdesivir 100 mg in sodium chloride 0.9 % 100 mL IVPB       "Followed by" Linked Group Details   03/04/21 0035 03/09/21 0959   03/04/21 1000  remdesivir 100 mg in sodium chloride 0.9 % 100 mL IVPB  Status:  Discontinued       "Followed by" Linked Group Details   03/03/21 1806 03/04/21 0035   03/04/21 1000  remdesivir 200 mg in sodium chloride 0.9% 250 mL IVPB       "Followed by" Linked Group Details   03/04/21 0035 03/04/21 1230   03/03/21 2000  cefTRIAXone (ROCEPHIN) 2 g in sodium chloride 0.9 % 100  mL IVPB        03/03/21 1708     03/03/21 2000  remdesivir 200 mg in sodium chloride 0.9% 250 mL IVPB  Status:  Discontinued       "Followed by" Linked Group Details   03/03/21 1806 03/04/21 0035   03/03/21 1600  ampicillin (OMNIPEN) 2 g in sodium chloride 0.9 % 100 mL IVPB        03/03/21 1525         Devices    LINES / TUBES:      Continuous Infusions: . sodium chloride 10 mL/hr at 03/06/21 0000  . sodium chloride Stopped (03/05/21 0323)  . sodium chloride 20 mL/hr at 03/08/21 1933  . ampicillin (OMNIPEN) IV 2 g (03/12/21 1205)  . cefTRIAXone (ROCEPHIN)  IV 2 g (03/12/21 1006)     Objective: Vitals:   03/11/21 2350 03/12/21 0337 03/12/21 0808 03/12/21 1149  BP: (!) 143/80 134/76 136/80 129/79  Pulse: 94 95 90 94  Resp: _0 Temp: 97.7 F (36.5 C) 97.7 F (36.5 C) 97.7 F (36.5 C) 98 F (36.7 C)  TempSrc: Oral Oral Oral Oral  SpO2: 90% 90% 93% 95%  Weight:      Height:        Intake/Output Summary (Last 24 hours) at 03/12/2021 1558 Last data filed at 03/12/2021 1305 Gross per 24 hour  Intake 1280 ml  Output 2050 ml  Net -770 ml   Filed Weights   03/03/21 1835  Weight: 85.3 kg    Examination:  General: A/O x4, negative acute respiratory distress Eyes: negative scleral hemorrhage, negative anisocoria, negative icterus ENT: Negative Runny nose, negative gingival bleeding, Neck:  Negative scars, masses, torticollis, lymphadenopathy, JVD Lungs: decreased breath sounds bibasilar , positive cough with speaking or deep inspiration.   Cardiovascular: Regular rate and rhythm without murmur gallop or rub normal S1 and S2 Abdomen: negative abdominal pain, nondistended, positive soft, bowel sounds, no rebound, no ascites, no appreciable mass Extremities: bilateral lower extremity edema 1+ mid calf (TED hose on) Skin: Negative rashes, lesions, ulcers Psychiatric:  Negative depression, positive anxiety, negative fatigue, negative mania  Central nervous  system:  Cranial nerves II through XII intact, tongue/uvula midline, all extremities muscle strength 5/5, sensation intact throughout, negative dysarthria, negative expressive aphasia, negative receptive aphasia.  .     Data Reviewed: Care during the described time interval was provided by me .  I have reviewed this patient's available data, including medical history, events of note, physical examination, and all test results as part of my evaluation.  CBC: Recent Labs  Lab 03/08/21 0037 03/09/21 0040 03/10/21 0503 03/11/21 0355 03/12/21 0825  WBC 13.0* 11.0* 11.7* 13.9* 10.0  NEUTROABS 11.7*  9.8* 10.3* 10.1* 8.8*  HGB 9.9* 10.1* 10.5* 11.8* 11.2*  HCT 30.0* 32.0* 33.6* 37.2* 35.0*  MCV 94.0 95.0 94.6 95.9 94.9  PLT 402* 450* 452* 468* 161*   Basic Metabolic Panel: Recent Labs  Lab 03/08/21 0037 03/09/21 0040 03/10/21 0503 03/11/21 0355 03/12/21 0825  NA 138 138 138 136 140  K 4.0 3.7 3.9 3.8 4.7  CL 104 102 103 99 100  CO2 _0 33*  GLUCOSE 135* 142* 156* 105* 138*  BUN 27* 32* 29* 25* 22  CREATININE 0.91 0.97 1.03 0.96 1.14  CALCIUM 8.3* 8.2* 8.2* 8.6* 8.9  MG 2.1 2.0 2.1 2.1 2.1  PHOS 3.5 3.3 4.1 2.9 5.6*   GFR: Estimated Creatinine Clearance: 70.1 mL/min (by C-G formula based on SCr of 1.14 mg/dL). Liver Function Tests: Recent Labs  Lab 03/08/21 0037 03/09/21 0040 03/10/21 0503 03/11/21 0355 03/12/21 0825  AST _1 ALT 46* 50* 45* 46* 38  ALKPHOS 122 110 97 107 92  BILITOT 0.5 0.5 0.6 0.6 1.0  PROT 5.6* 5.6* 5.4* 5.8* 6.1*  ALBUMIN 2.0* 2.2* 2.2* 2.5* 2.8*   No results for input(s): LIPASE, AMYLASE in the last 168 hours. No results for input(s): AMMONIA in the last 168 hours. Coagulation Profile: No results for input(s): INR, PROTIME in the last 168 hours. Cardiac Enzymes: No results for input(s): CKTOTAL, CKMB, CKMBINDEX, TROPONINI in the last 168 hours. BNP (last 3 results) No results for input(s): PROBNP in the last 8760  hours. HbA1C: No results for input(s): HGBA1C in the last 72 hours. CBG: No results for input(s): GLUCAP in the last 168 hours. Lipid Profile: No results for input(s): CHOL, HDL, LDLCALC, TRIG, CHOLHDL, LDLDIRECT in the last 72 hours. Thyroid Function Tests: No results for input(s): TSH, T4TOTAL, FREET4, T3FREE, THYROIDAB in the last 72 hours. Anemia Panel: Recent Labs    03/11/21 0355 03/12/21 0825  FERRITIN 570* 464*   Sepsis Labs: Recent Labs  Lab 03/06/21 0057 03/07/21 0251 03/08/21 0037  PROCALCITON 0.25 0.19 0.16    Recent Results (from the past 240 hour(s))  Blood culture (routine x 2)     Status: None   Collection Time: 03/03/21  1:58 PM   Specimen: BLOOD  Result Value Ref Range Status   Specimen Description   Final    BLOOD LEFT ANTECUBITAL Performed at [facility], [place] [address] [place], [place] [address]    Special Requests   Final    BOTTLES DRAWN AEROBIC AND ANAEROBIC Blood Culture adequate volume Performed at [facility] [address], [place], [place] [address]    Culture   Final    NO GROWTH 5 DAYS Performed at [facility], [place] [address] [place], [place] [address]    Report Status 03/08/2021 FINAL  Final  Resp Panel by RT-PCR (Flu A&B, Covid) Nasopharyngeal Swab     Status: Abnormal   Collection Time: 03/03/21  3:08 PM   Specimen: Nasopharyngeal Swab; Nasopharyngeal(NP) swabs in vial transport medium  Result Value Ref Range Status   SARS Coronavirus 2 by RT PCR POSITIVE (A) NEGATIVE Final    Comment: RESULT CALLED TO, READ BACK BY AND VERIFIED WITH: BOWEN,M. RN AT 1628 03/03/21 MULLINS,T (NOTE) SARS-CoV-2 target nucleic acids are DETECTED.  The SARS-CoV-2 RNA is generally detectable in upper respiratory specimens during the acute phase of infection. Positive results are indicative of the presence of the identified virus, but do not rule out  bacterial infection or co-infection with other  pathogens not detected by the test. Clinical correlation with patient history and other diagnostic information is necessary to determine patient infection status. The expected result is Negative.  Fact Sheet for Patients: EntrepreneurPulse.com.au  Fact Sheet for Healthcare Providers: IncredibleEmployment.be  This test is not yet approved or cleared by the [place] FDA and  has been authorized for detection and/or diagnosis of SARS-CoV-2 by FDA under an Emergency Use Authorization (EUA).  This EUA will remain in effect (meaning this test c an be used) for the duration of  the COVID-19 declaration under Section 564(b)(1) of the Act, 21 U.S.C. section 360bbb-3(b)(1), unless the authorization is terminated or revoked sooner.     Influenza A by PCR NEGATIVE NEGATIVE Final   Influenza B by PCR NEGATIVE NEGATIVE Final    Comment: (NOTE) The Xpert Xpress SARS-CoV-2/FLU/RSV plus assay is intended as an aid in the diagnosis of influenza from Nasopharyngeal swab specimens and should not be used as a sole basis for treatment. Nasal washings and aspirates are unacceptable for Xpert Xpress SARS-CoV-2/FLU/RSV testing.  Fact Sheet for Patients: EntrepreneurPulse.com.au  Fact Sheet for Healthcare Providers: IncredibleEmployment.be  This test is not yet approved or cleared by the [place] FDA and has been authorized for detection and/or diagnosis of SARS-CoV-2 by FDA under an Emergency Use Authorization (EUA). This EUA will remain in effect (meaning this test can be used) for the duration of the COVID-19 declaration under Section 564(b)(1) of the Act, 21 U.S.C. section 360bbb-3(b)(1), unless the authorization is terminated or revoked.  Performed at [facility], [place] [address], [place], [place] [address]   Urine culture     Status: None   Collection Time: 03/03/21  3:08 PM   Specimen:  In/Out Cath Urine  Result Value Ref Range Status   Specimen Description   Final    IN/OUT CATH URINE Performed at [facility] [address] [place], [place] [address]    Special Requests   Final    NONE Performed at [facility], [place] [address] [place], [place] [address]    Culture   Final    NO GROWTH Performed at [facility], [place] [address], [place], [place] [address]    Report Status 03/04/2021 FINAL  Final  Blood culture (routine x 2)     Status: None   Collection Time: 03/03/21  3:41 PM   Specimen: BLOOD  Result Value Ref Range Status   Specimen Description   Final    BLOOD RIGHT ANTECUBITAL Performed at [facility] [address] [place], [place] [address]    Special Requests   Final    BOTTLES DRAWN AEROBIC AND ANAEROBIC Blood Culture adequate volume Performed at [facility] [address] [place], [place] [address]    Culture   Final    NO GROWTH 5 DAYS Performed at [facility], [place] [address], [place], [place] [address]    Report Status 03/08/2021 FINAL  Final  MRSA PCR Screening     Status: Abnormal   Collection Time: 03/04/21 11:53 PM   Specimen: Nasopharyngeal  Result Value Ref Range Status   MRSA by PCR POSITIVE (A) NEGATIVE Final    Comment:        The GeneXpert MRSA Assay (FDA approved for NASAL specimens only), is one component of a comprehensive MRSA colonization surveillance program. It is not intended to diagnose MRSA infection nor to  guide or monitor treatment for MRSA infections. RESULT CALLED TO, READ BACK BY AND VERIFIED WITH: [facility] RN AT 0121 03/05/2021 MITCHELL,L Performed at [facility], [place] [address], [place],  [address]          Radiology Studies: CT ANGIO CHEST PE W OR WO CONTRAST  Result Date: 03/12/2021 CLINICAL DATA:  COVID pneumonia. Elevated D-dimer. Worsening shortness of breath EXAM: CT ANGIOGRAPHY CHEST  WITH CONTRAST TECHNIQUE: Multidetector CT imaging of the chest was performed using the standard protocol during bolus administration of intravenous contrast. Multiplanar CT image reconstructions and MIPs were obtained to evaluate the vascular anatomy. CONTRAST:  14m OMNIPAQUE IOHEXOL 350 MG/ML SOLN COMPARISON:  03/02/2021 FINDINGS: Cardiovascular: No filling defects in the pulmonary arteries to suggest pulmonary emboli. Heart is mildly enlarged. Aorta normal caliber. Scattered calcifications in the aorta. Mediastinum/Nodes: Borderline size mediastinal lymph nodes diffusely, likely reactive. Trachea and esophagus are unremarkable. Thyroid unremarkable. Lungs/Pleura: Extensive bilateral airspace disease, worsening significantly since prior study particularly in the mid and upper lung zones. Large bilateral pleural effusions. Upper Abdomen: Imaging into the upper abdomen demonstrates no acute findings. Musculoskeletal: Chest wall soft tissues are unremarkable. No acute bony abnormality. Review of the MIP images confirms the above findings. IMPRESSION: Extensive bilateral airspace disease, particularly in the mid and upper lung fields most compatible with pneumonia or ARDS. Large bilateral pleural effusions. Cardiomegaly. No evidence of pulmonary embolus. Electronically Signed   By: KRolm BaptiseM.D.   On: 03/12/2021 03:00        Scheduled Meds: . vitamin C  500 mg Oral Daily  . cholecalciferol  1,000 Units Oral Daily  . [START ON 03/13/2021] enoxaparin (LOVENOX) injection  40 mg Subcutaneous Q24H  . feeding supplement  237 mL Oral BID BM  . furosemide  40 mg Oral Daily  . Ipratropium-Albuterol  1 puff Inhalation Q6H  . levothyroxine  175 mcg Oral QAC breakfast  . mouth rinse  15 mL Mouth Rinse BID  . methylPREDNISolone (SOLU-MEDROL) injection  60 mg Intravenous Q12H  . multivitamin with minerals  1 tablet Oral Daily  . pantoprazole  40 mg Oral Daily  . traZODone  50 mg Oral QHS  . zinc sulfate  220  mg Oral Daily   Continuous Infusions: . sodium chloride 10 mL/hr at 03/06/21 0000  . sodium chloride Stopped (03/05/21 0323)  . sodium chloride 20 mL/hr at 03/08/21 1933  . ampicillin (OMNIPEN) IV 2 g (03/12/21 1205)  . cefTRIAXone (ROCEPHIN)  IV 2 g (03/12/21 1006)     LOS: 9 days    Time spent:40 min    WOODS, CGeraldo Docker MD [facility]   If 7PM-7AM, please contact night-coverage 03/12/2021, 3:58 PM

## 2021-03-12 NOTE — Progress Notes (Signed)
Procedure(s) (LRB): TRANSESOPHAGEAL ECHOCARDIOGRAM (TEE) (N/A) Subjective: Breathing better today vs last night CT overnight  Objective: Vital signs in last 24 hours: Temp:  [97.6 F (36.4 C)-98 F (36.7 C)] 98 F (36.7 C) (04/18 1149) Pulse Rate:  [90-100] 94 (04/18 1149) Cardiac Rhythm: Normal sinus rhythm (04/18 0726) Resp:  [16-20] 20 (04/18 1149) BP: (129-147)/(76-87) 129/79 (04/18 1149) SpO2:  [90 %-95 %] 95 % (04/18 1149)  Hemodynamic parameters for last 24 hours:    Intake/Output from previous day: 04/17 0701 - 04/18 0700 In: 1280 [P.O.:480; IV Piggyback:800] Out: 2050 [Urine:2050] Intake/Output this shift: Total I/O In: 240 [P.O.:240] Out: -   General appearance: alert, cooperative and no distress Neurologic: intact Heart: + systolic murmur Lungs: diminished breath sounds bibasilar Extremities: edema 1+ edema  Lab Results: Recent Labs    03/11/21 0355 03/12/21 0825  WBC 13.9* 10.0  HGB 11.8* 11.2*  HCT 37.2* 35.0*  PLT 468* 428*   BMET:  Recent Labs    03/11/21 0355 03/12/21 0825  NA 136 140  K 3.8 4.7  CL 99 100  CO2 29 33*  GLUCOSE 105* 138*  BUN 25* 22  CREATININE 0.96 1.14  CALCIUM 8.6* 8.9    PT/INR: No results for input(s): LABPROT, INR in the last 72 hours. ABG    Component Value Date/Time   TCO2 27 03/30/2016 1130   CBG (last 3)  No results for input(s): GLUCAP in the last 72 hours.  Assessment/Plan: S/P Procedure(s) (LRB): TRANSESOPHAGEAL ECHOCARDIOGRAM (TEE) (N/A) -Enterococcal endocarditis- on ampicillin, ceftriaxone- plan for 6 week course  COVID- 19 pneumonia- treated with remdesivir.  primarily RUL, CT overnight showed progression in RUL and an increase in LUL opacities as well.  Also had bilateral effusions- may be CHF or inflammatory. Respiratory status improved after diuresis. Will order standing PO Lasix dose, adjust as needed.  Any surgical intervention would have to wait for clearance of pulmonary  process.  TEE sometime this week to assess MV more clearly   LOS: 9 days    Loreli Slot 03/12/2021

## 2021-03-12 NOTE — Progress Notes (Signed)
CT neg for PE. Ok to reduce Lovenox to prophylaxis dose per Dr. Joseph Art.  Ulyses Southward, PharmD, BCIDP, AAHIVP, CPP Infectious Disease Pharmacist 03/12/2021 11:19 AM

## 2021-03-12 NOTE — Progress Notes (Signed)
[facility]    Date of Admission:  03/03/2021   Total days of antibiotics 11/day 9 amp/ctx           ID: Brandon [name] is a 70 y.o. 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 Afebrile. Remains on 2 L [place]  Underwent chest CT- ruled out PE. But bilateral airspace disease c/w ARDS  ROS: no diarrhea, no rash, no chest pain. 12 point ros is otherwise negative except what is mentioned above  Medications:  . vitamin C  500 mg Oral Daily  . cholecalciferol  1,000 Units Oral Daily  . [START ON 03/13/2021] enoxaparin (LOVENOX) injection  40 mg Subcutaneous Q24H  . feeding supplement  237 mL Oral BID BM  . Ipratropium-Albuterol  1 puff Inhalation Q6H  . levothyroxine  175 mcg Oral QAC breakfast  . mouth rinse  15 mL Mouth Rinse BID  . methylPREDNISolone (SOLU-MEDROL) injection  60 mg Intravenous Q12H  . multivitamin with minerals  1 tablet Oral Daily  . pantoprazole  40 mg Oral Daily  . traZODone  50 mg Oral QHS  . zinc sulfate  220 mg Oral Daily    Objective: Vital signs in last 24 hours: Temp:  [97.6 F (36.4 C)-98 F (36.7 C)] 98 F (36.7 C) (04/18 1149) Pulse Rate:  [90-100] 94 (04/18 1149) Resp:  [16-20] 20 (04/18 1149) BP: (129-147)/(76-87) 129/79 (04/18 1149) SpO2:  [90 %-96 %] 95 % (04/18 1149) Physical Exam  Constitutional: He is oriented to person, place, and time. He appears well-developed and well-nourished. No distress wearing 2L [place] HENT:  Mouth/Throat: Oropharynx is clear and moist. No oropharyngeal exudate.  Cardiovascular: Normal rate,  regular rhythm and normal heart sounds. Exam reveals no gallop and no friction rub.  No murmur heard.  Pulmonary/Chest: Effort normal and breath sounds normal. No respiratory distress. He has no wheezes.  Abdominal: Soft. Bowel sounds are normal. He exhibits no distension. There is no tenderness.   Neurological: He is alert and oriented to person, place, and time.  Skin: Skin is warm and dry. No rash noted. No erythema.  Psychiatric: He has a normal mood and affect. His behavior is normal.     Lab Results Recent Labs    03/11/21 0355 03/12/21 0825  WBC 13.9* 10.0  HGB 11.8* 11.2*  HCT 37.2* 35.0*  NA 136 140  K 3.8 4.7  CL 99 100  CO2 29 33*  BUN 25* 22  CREATININE 0.96 1.14   Liver Panel Recent Labs    03/11/21 0355 03/12/21 0825  PROT 5.8* 6.1*  ALBUMIN 2.5* 2.8*  AST 23 19  ALT 46* 38  ALKPHOS 107 92  BILITOT 0.6 1.0   C-Reactive Protein Recent Labs    03/11/21 0355 03/12/21 0825  CRP 1.8* 7.4*    Microbiology: MRSA colonized 4/9 blood cx NGTD 4/8 blood cx e.faecalis -- Enterococcus faecalis      MIC    AMPICILLIN <=2 SENSITIVE  Sensitive    GENTAMICIN SYNERGY SENSITIVE  Sensitive    VANCOMYCIN 1 SENSITIVE  Sensitive  Studies/Results: CT ANGIO CHEST PE W OR WO CONTRAST  Result Date: 03/12/2021 CLINICAL DATA:  COVID pneumonia. Elevated D-dimer. Worsening shortness of breath EXAM: CT ANGIOGRAPHY CHEST WITH CONTRAST TECHNIQUE: Multidetector CT imaging of the chest was performed using the standard protocol during bolus administration of intravenous contrast. Multiplanar CT image reconstructions and MIPs were obtained to evaluate the vascular anatomy. CONTRAST:  28mL OMNIPAQUE IOHEXOL 350 MG/ML SOLN COMPARISON:  03/02/2021 FINDINGS: Cardiovascular: No filling defects in the pulmonary arteries to suggest pulmonary emboli. Heart is mildly enlarged. Aorta normal caliber. Scattered calcifications in the aorta. Mediastinum/Nodes: Borderline size mediastinal  lymph nodes diffusely, likely reactive. Trachea and esophagus are unremarkable. Thyroid unremarkable. Lungs/Pleura: Extensive bilateral airspace disease, worsening significantly since prior study particularly in the mid and upper lung zones. Large bilateral pleural effusions. Upper Abdomen: Imaging into the upper abdomen demonstrates no acute findings. Musculoskeletal: Chest wall soft tissues are unremarkable. No acute bony abnormality. Review of the MIP images confirms the above findings. IMPRESSION: Extensive bilateral airspace disease, particularly in the mid and upper lung fields most compatible with pneumonia or ARDS. Large bilateral pleural effusions. Cardiomegaly. No evidence of pulmonary embolus. Electronically Signed   By: Charlett Nose M.D.   On: 03/12/2021 03:00    4/10 TTE: 1. There is a mass present on the anterior mitral valve leaflet  concerning for a vegetation that measures 1.4x1.0cm. Suspect moderate  mitral regurgitation with two separate jets that are posteriorly and  anteriorly directed raising concern for possible  leaflet perforation.The mean gradient across the MV is (HR 100) with  MVA by continuity of 1.35cm2 suggesting at least mild mitral stenosis in  the setting of the mitral valve vegetation as detailed above. Recommend  TEE for further evaluation.  2. Left ventricular ejection fraction, by estimation, is 60 to 65%. The  left ventricle has normal function. The left ventricle has no regional  wall motion abnormalities. There is moderate asymmetric left ventricular  hypertrophy of the basal-septal  segment. There is mild LVH of the rest of the LV segments. Left  ventricular diastolic parameters are consistent with Grade I diastolic  dysfunction (impaired relaxation).  3. Right ventricular systolic function is normal. The right ventricular  size is normal. There is severely elevated pulmonary artery systolic  pressure. The estimated right ventricular systolic  pressure is 71.0 mmHg.  4. The aortic valve is tricuspid. There is mild calcification of the  aortic valve. There is moderate thickening of the aortic valve. Aortic  valve regurgitation is mild. Mild to moderate aortic valve  sclerosis/calcification is present, without any  evidence of aortic stenosis. No distinct vegetations visualized, but the  valve is thickened.  5. The inferior vena cava is normal in size with greater than 50%  respiratory variability, suggesting right atrial pressure of 3 mmHg.   Comparison(s): No prior Echocardiogram.   Conclusion(s)/Recommendation(s): Findings concerning for mitral valve  vegetation, would recommend a Transesophageal Echocardiogram for  clarification. Assessment/Plan: covid pneumonia = from 4/10-4/14 received remdesivir and continues to be on methylprednisolone. Can be off of airborne/contact isolation on 4/20 since not severe covid and patient is improving.  Lower extremity swelling = continues on lasix to help with fluid status  Enterococcal native valve endocarditis = continue on amp/ceftriaxone. Has upcoming TEE on Tuesday/wednesday to confirm TTE findings to see if perforated MV. In terms of timing of surgery, this is complicated by recent co-infection, covid pneumonia. Would recommend to defer surgery through end of IV therapy( 6 wk) if feasible.  [facility] Cell: [phone] Pager: [phone]  03/12/2021, 12:24 PM

## 2021-03-13 ENCOUNTER — Inpatient Hospital Stay (HOSPITAL_COMMUNITY): Payer: Medicare Other

## 2021-03-13 DIAGNOSIS — A419 Sepsis, unspecified organism: Secondary | ICD-10-CM | POA: Diagnosis not present

## 2021-03-13 DIAGNOSIS — U071 COVID-19: Secondary | ICD-10-CM | POA: Diagnosis not present

## 2021-03-13 DIAGNOSIS — I059 Rheumatic mitral valve disease, unspecified: Secondary | ICD-10-CM | POA: Diagnosis not present

## 2021-03-13 DIAGNOSIS — R7881 Bacteremia: Secondary | ICD-10-CM | POA: Diagnosis not present

## 2021-03-13 HISTORY — PX: IR THORACENTESIS ASP PLEURAL SPACE W/IMG GUIDE: IMG5380

## 2021-03-13 LAB — AMYLASE, PLEURAL OR PERITONEAL FLUID: Amylase, Fluid: 15 U/L

## 2021-03-13 LAB — BASIC METABOLIC PANEL
Anion gap: 10 (ref 5–15)
BUN: 25 mg/dL — ABNORMAL HIGH (ref 8–23)
CO2: 32 mmol/L (ref 22–32)
Calcium: 9.1 mg/dL (ref 8.9–10.3)
Chloride: 95 mmol/L — ABNORMAL LOW (ref 98–111)
Creatinine, Ser: 1.01 mg/dL (ref 0.61–1.24)
GFR, Estimated: >60 mL/min (ref >60)
Glucose, Bld: 126 mg/dL — ABNORMAL HIGH (ref 70–99)
Potassium: 3.3 mmol/L — ABNORMAL LOW (ref 3.5–5.1)
Sodium: 137 mmol/L (ref 135–145)

## 2021-03-13 LAB — MAGNESIUM: Magnesium: 2 mg/dL (ref 1.7–2.4)

## 2021-03-13 LAB — PHOSPHORUS: Phosphorus: 3 mg/dL (ref 2.5–4.6)

## 2021-03-13 LAB — CBC WITH DIFFERENTIAL/PLATELET
Abs Immature Granulocytes: 0.32 10*3/uL — ABNORMAL HIGH (ref 0.00–0.07)
Basophils Absolute: 0 10*3/uL (ref 0.0–0.1)
Basophils Relative: 0 %
Eosinophils Absolute: 0 10*3/uL (ref 0.0–0.5)
Eosinophils Relative: 0 %
HCT: 36.2 % — ABNORMAL LOW (ref 39.0–52.0)
Hemoglobin: 11.8 g/dL — ABNORMAL LOW (ref 13.0–17.0)
Immature Granulocytes: 2 %
Lymphocytes Relative: 4 %
Lymphs Abs: 0.8 10*3/uL (ref 0.7–4.0)
MCH: 30.6 pg (ref 26.0–34.0)
MCHC: 32.6 g/dL (ref 30.0–36.0)
MCV: 94 fL (ref 80.0–100.0)
Monocytes Absolute: 0.7 10*3/uL (ref 0.1–1.0)
Monocytes Relative: 3 %
Neutro Abs: 18.2 10*3/uL — ABNORMAL HIGH (ref 1.7–7.7)
Neutrophils Relative %: 91 %
Platelets: 469 10*3/uL — ABNORMAL HIGH (ref 150–400)
RBC: 3.85 MIL/uL — ABNORMAL LOW (ref 4.22–5.81)
RDW: 14.7 % (ref 11.5–15.5)
WBC: 19.9 10*3/uL — ABNORMAL HIGH (ref 4.0–10.5)
nRBC: 0 % (ref 0.0–0.2)

## 2021-03-13 LAB — ALBUMIN, PLEURAL OR PERITONEAL FLUID: Albumin, Fluid: <1 g/dL

## 2021-03-13 LAB — PROTEIN, PLEURAL OR PERITONEAL FLUID: Total protein, fluid: <3 g/dL

## 2021-03-13 LAB — GRAM STAIN

## 2021-03-13 LAB — LACTATE DEHYDROGENASE, PLEURAL OR PERITONEAL FLUID: LD, Fluid: 57 U/L — ABNORMAL HIGH (ref 3–23)

## 2021-03-13 LAB — BODY FLUID CELL COUNT WITH DIFFERENTIAL
Eos, Fluid: 0 %
Lymphs, Fluid: 63 %
Monocyte-Macrophage-Serous Fluid: 33 % — ABNORMAL LOW (ref 50–90)
Neutrophil Count, Fluid: 4 % (ref 0–25)
Total Nucleated Cell Count, Fluid: 268 cu mm (ref 0–1000)

## 2021-03-13 LAB — PROTEIN, TOTAL: Total Protein: 6.2 g/dL — ABNORMAL LOW (ref 6.5–8.1)

## 2021-03-13 LAB — LACTATE DEHYDROGENASE: LDH: 188 U/L (ref 98–192)

## 2021-03-13 LAB — D-DIMER, QUANTITATIVE: D-Dimer, Quant: 1.63 ug/mL-FEU — ABNORMAL HIGH (ref 0.00–0.50)

## 2021-03-13 LAB — FERRITIN: Ferritin: 451 ng/mL — ABNORMAL HIGH (ref 24–336)

## 2021-03-13 LAB — ALBUMIN: Albumin: 3.2 g/dL — ABNORMAL LOW (ref 3.5–5.0)

## 2021-03-13 LAB — GLUCOSE, PLEURAL OR PERITONEAL FLUID: Glucose, Fluid: 126 mg/dL

## 2021-03-13 LAB — BRAIN NATRIURETIC PEPTIDE: B Natriuretic Peptide: 331 pg/mL — ABNORMAL HIGH (ref 0.0–100.0)

## 2021-03-13 LAB — C-REACTIVE PROTEIN: CRP: 4.4 mg/dL — ABNORMAL HIGH (ref <1.0)

## 2021-03-13 MED ORDER — CHLORHEXIDINE GLUCONATE CLOTH 2 % EX PADS
6.0000 | MEDICATED_PAD | Freq: Every day | CUTANEOUS | Status: DC
  Administered 2021-03-13 – 2021-03-16 (×4): 6 via TOPICAL

## 2021-03-13 MED ORDER — LIDOCAINE HCL (PF) 1 % IJ SOLN
INTRAMUSCULAR | Status: DC | PRN
  Administered 2021-03-13: 30 mL

## 2021-03-13 MED ORDER — LIDOCAINE HCL 1 % IJ SOLN
INTRAMUSCULAR | Status: AC
  Filled 2021-03-13: qty 20

## 2021-03-13 MED ORDER — SODIUM CHLORIDE 0.9% FLUSH: 10.0000 mL | INTRAVENOUS | Status: DC | PRN

## 2021-03-13 NOTE — Procedures (Signed)
PROCEDURE SUMMARY:  Successful US guided right thoracentesis. Yielded 420 mL of clear yellow fluid. Pt tolerated procedure well. No immediate complications.  Specimen was sent for labs. CXR ordered.  EBL < 5 mL  Brayton El PA-C 03/13/2021 1:29 PM

## 2021-03-13 NOTE — Progress Notes (Addendum)
PROGRESS NOTE    Brandon [name]  WEX:[id number] DOB: 03/24/1951 DOA: 03/03/2021 PCP: Brandon Loader, FNP     Brief Narrative:  Brandon Harbeson Nelsenis a 70 y.o.WM PMHx prior covid pna, hypothyroid, HLD   Presented to ED on 4/8 with complaints of fevers, sob, nonproductive cough. During workup, pt was found to have evidence suggesting RUL pna. Pt was recommended admission, however, pt declined and was ultimately discharged from the ED on augmentin and azithromycin. Blood cultures later returned pos for enterococcus and pt was asked to return to ED. Pt reports generalized weakness, increased cough, decreased exercise tolerance. Also reports fevers - in the ED, pt was found to be tachycardic with tachypnea. WBC noted to be 17.6. Blood cx from 4/8 confirmed enterococcus. ID was consulted and hospitalist requested for admission.  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  As well he is diagnosed with COVID-19 of pneumonia, he is vaccinated, and had previous infection last August.   Subjective: 4/19 right/O x4, sitting in chair comfortably.  States feels as if he can breathe better s/p RIGHT thoracentesis.  S/p RIGHT PICC line placed.   Assessment & Plan: Covid vaccination; vaccinated 3/3   Principal Problem:   Endocarditis of mitral valve Active Problems:   Sepsis ([place])   Bacteremia   COVID-19 virus infection   Osler node   Acute respiratory failure with hypoxia ([place])   Pneumonia due to COVID-19 virus   CAP (community acquired pneumonia)   Elevated liver enzymes  Enterococcus bacteremia -Continue current antibiotics -4/15 discussed case with Brandon [name] ID he recommends that we do not start the clock on actual antibiotic treatment until valve has been replaced/repaired.  After that patient will require 6 weeks of antibiotics.     Sepsis POA - Presented with fevers,  leukocytosis, tachycardia in the setting of Enterococcus bacteremia and endocarditis - ID following,; currently on ampicillin and ceftriaxone - ID recommends patient to be discharged home on 6 weeks of antibiotics prior to repair/replacement of MV.  Acute hypoxic respiratory failure due to COVID-19 pneumonia/ CAP pneumonia  COVID-19 Labs  Recent Labs    03/11/21 0355 03/12/21 0825 03/13/21 0155  DDIMER 2.21* 1.36* 1.63*  FERRITIN 570* 464* 451*  LDH 182 157 188  CRP 1.8* 7.4* 4.4*   Lab Results  Component Value Date   SARSCOV2NAA POSITIVE (A) 03/03/2021   [place] NEGATIVE 03/02/2021  -Remdesivir per pharmacy protocol -Solu-Medrol 60 mg BID -Vitamins per COVID protocol -Incentive spirometry -Flutter valve -Combivent QID - Albuterol PRN -Given his endocarditis, and bacteremia, cannot receive baricitinib or Actemra -Continue current antibiotics which will cover both CAP and endocarditis -4/14 patient's respiratory status appears to be improving. - Patient with moderate to severe COVID pneumonia will be considered contagious by CDC guidelines until 30 April 4/20 Ambulatory SPO2 pending  Pulmonary embolus? -4/17 given that patient is having episodes of increased WOB/SOB and his D-dimer has jumped significantly today will obtain CTA chest PE protocol -4/18 CTA PE protocol from 4/17 negative for PE  Bilateral pleural effusion - 4/18 large bilateral pleural effusions. - 4/18 Albumin 50 g + Lasix IV 60 mg: Administer albumin prior to Lasix - 4/18 strict in and out 4.1 L - Daily weight Filed Weights   03/03/21 1835  Weight: 85.3 kg  - Place Foley - Continuous pulse ox - Telemetry - 4/19 s/p RIGHT thoracentesis: 420 ml aspirated.  Sent for culture - We will continue to diurese aggressively: Scheduled for thoracentesis by IR when patient goes down on Wednesday 4/20 for his TEE_0   Endocarditis mitral valve -TTE remarkable for 1 x 1.5 cm vegetation on the mitral valve.   Cardiology consulted for TEE, timing per cardiology  -Continue antibiotics per ID recommendations. -4/13 repeat TEE on 4/14 -CT surgery consulted as well, further recommendation pending TEE and improvement of COVID-19.  Discussed with cardiology, plan for TEE on Thursday at 2 PM. -me an area on his finger that may represent an Osler's node as he has a tender area on his fingertip he also has a possible splinter as seen on picture above. -Need to monitor for further stigmata in particular evidence of septic embolization on antibiotic therapy as this would be a reason for more urgent cardiothoracic surgery. Brandon [name] with cardiothoracic surgery.  He does not see an urgent indication for surgery at this point in time. -Decision will be made on treatment options after repeat TEE - 4/14 repeat TEE: Spoke at length with [facility] cardiology interventionalists, and Brandon [name] cardiothoracic surgery, and after discussing case was determined we should wait until Wednesday to perform TEE.  At which point we will determine replace/repair valve. -4/18 discussed case with Brandon [name], ID given patient's severe lung injury secondary to COVID she recommends that any MV repair/replacement only occur after 6 weeks of IV antibiotics.  She states will speak with cardiothoracic surgery. -4/19 s/p RIGHT single-lumen PICC line placement  Elevated AST/ALT/alk phos  -Appears to be somewhat chronic, mild, trending down-unclear baseline -Downtrending since admission likely shock liver in the setting of sepsis as well due to Covid. -2018 labs markedly elevated (AST439/ALT760/AlkP230)  -unremarkable work-up at that time; considered for outpatient follow-up for hemochromatosis versus autoimmune work-up but does not appear to be any documentation in our system that this was ever done -4/15 liver enzymes normalized  Hypothyroid - TSH 2.5 -Synthroid 175 mcg daily  Normocytic anemia - Likely  hemodilutional. - 4/14 occult blood pending - No signs or symptoms of occult bleed. Lab Results  Component Value Date   HGB 11.8 (L) 03/13/2021   HGB 11.2 (L) 03/12/2021   HGB 11.8 (L) 03/11/2021   HGB 10.5 (L) 03/10/2021   HGB 10.1 (L) 03/09/2021  -Stable  Lower extremity edema -4/17 albumin 25 g x 1  + Lasix IV 60 mg x 1 (Administer albumin prior to Lasix) - 4/17 TED hose 12 hours on during the day: Remove at night - See pleural effusion     DVT prophylaxis: Lovenox Code Status: Full Family Communication: 4/16 daughter at bedside for discussion of plan of care all questions answered Status is: Inpatient    Dispo: The patient is from: Home              Anticipated d/c is to: Home vs SNF              Anticipated d/c date is for/21              Patient currently unstable      Consultants:  Brandon [name] with cardiothoracic surgery ID Brandon [name] Cardiology Dr. Lucianne Lei Dam/Brandon [name] ID  Procedures/Significant Events:  4/17 CTA PE protocol:-Extensive bilateral airspace disease, particularly in the mid and upper lung fields most compatible with pneumonia or ARDS. -Large bilateral pleural effusions. -Negative PE -Cardiomegaly. 4/19 s/p RIGHT thoracentesis: 420 ml aspirated    I have personally reviewed and interpreted all  radiology studies and my findings are as above.  VENTILATOR SETTINGS: Room air 4/19 SPO2: 96%     Cultures 4/8 blood LEFT AC positive Enterococcus faecalis 4/8 caps RIGHT AC positive Enterococcus faecalis 4/9 SARS coronavirus positive 4/9 influenza A/B negative 4/9 blood LEFT AC negative final 4/9 RIGHT AC negative final 4/10 MRSA by PCR positive  4/10 acute hepatitis panel negative 4/10 HIV screen negative    Antimicrobials: Anti-infectives (From admission, onward)   Start     Ordered Stop   03/05/21 1000  remdesivir 100 mg in sodium chloride 0.9 % 100 mL IVPB       "Followed by" Linked Group Details   03/04/21 0035  03/09/21 0959   03/04/21 1000  remdesivir 100 mg in sodium chloride 0.9 % 100 mL IVPB  Status:  Discontinued       "Followed by" Linked Group Details   03/03/21 1806 03/04/21 0035   03/04/21 1000  remdesivir 200 mg in sodium chloride 0.9% 250 mL IVPB       "Followed by" Linked Group Details   03/04/21 0035 03/04/21 1230   03/03/21 2000  cefTRIAXone (ROCEPHIN) 2 g in sodium chloride 0.9 % 100 mL IVPB        03/03/21 1708     03/03/21 2000  remdesivir 200 mg in sodium chloride 0.9% 250 mL IVPB  Status:  Discontinued       "Followed by" Linked Group Details   03/03/21 1806 03/04/21 0035   03/03/21 1600  ampicillin (OMNIPEN) 2 g in sodium chloride 0.9 % 100 mL IVPB        03/03/21 1525         Devices    LINES / TUBES:  RIGHT PICC line 4/19>>>    Continuous Infusions: . sodium chloride 10 mL/hr at 03/06/21 0000  . sodium chloride Stopped (03/05/21 0323)  . sodium chloride 20 mL/hr at 03/08/21 1933  . ampicillin (OMNIPEN) IV 2 g (03/13/21 1220)  . cefTRIAXone (ROCEPHIN)  IV 2 g (03/13/21 1103)     Objective: Vitals:   03/12/21 2352 03/13/21 0419 03/13/21 0741 03/13/21 1229  BP: (!) 173/93 131/75 130/81 129/78  Pulse: 99 89 90 85  Resp: _0 Temp: 98 F (36.7 C) 97.8 F (36.6 C) 97.9 F (36.6 C) 98 F (36.7 C)  TempSrc: Oral Oral Oral Oral  SpO2: 96% 92% 95% 94%  Weight:      Height:        Intake/Output Summary (Last 24 hours) at 03/13/2021 1314 Last data filed at 03/13/2021 1200 Gross per 24 hour  Intake 920 ml  Output 5075 ml  Net -4155 ml   Filed Weights   03/03/21 1835  Weight: 85.3 kg    Examination:  General: A/O x4, negative acute respiratory distress Eyes: negative scleral hemorrhage, negative anisocoria, negative icterus ENT: Negative Runny nose, negative gingival bleeding, Neck:  Negative scars, masses, torticollis, lymphadenopathy, JVD Lungs: clear to auscultation bilateral with mild decreased breath sounds RLL,  positive cough with  speaking or deep inspiration.   Cardiovascular: Regular rate and rhythm without murmur gallop or rub normal S1 and S2 Abdomen: negative abdominal pain, nondistended, positive soft, bowel sounds, no rebound, no ascites, no appreciable mass Extremities: bilateral lower extremity edema 1+ mid calf (TED hose on) Skin: Negative rashes, lesions, ulcers Psychiatric:  Negative depression, positive anxiety, negative fatigue, negative mania  Central nervous system:  Cranial nerves II through XII intact, tongue/uvula midline, all extremities muscle strength 5/5,  sensation intact throughout, negative dysarthria, negative expressive aphasia, negative receptive aphasia.  .     Data Reviewed: Care during the described time interval was provided by me .  I have reviewed this patient's available data, including medical history, events of note, physical examination, and all test results as part of my evaluation.  CBC: Recent Labs  Lab 03/09/21 0040 03/10/21 0503 03/11/21 0355 03/12/21 0825 03/13/21 0155  WBC 11.0* 11.7* 13.9* 10.0 19.9*  NEUTROABS 9.8* 10.3* 10.1* 8.8* 18.2*  HGB 10.1* 10.5* 11.8* 11.2* 11.8*  HCT 32.0* 33.6* 37.2* 35.0* 36.2*  MCV 95.0 94.6 95.9 94.9 94.0  PLT 450* 452* 468* 428* 355*   Basic Metabolic Panel: Recent Labs  Lab 03/09/21 0040 03/10/21 0503 03/11/21 0355 03/12/21 0825 03/13/21 0155  NA 138 138 136 140 137  K 3.7 3.9 3.8 4.7 3.3*  CL 102 103 99 100 95*  CO2 _0 33* 32  GLUCOSE 142* 156* 105* 138* 126*  BUN 32* 29* 25* 22 25*  CREATININE 0.97 1.03 0.96 1.14 1.01  CALCIUM 8.2* 8.2* 8.6* 8.9 9.1  MG 2.0 2.1 2.1 2.1 2.0  PHOS 3.3 4.1 2.9 5.6* 3.0   GFR: Estimated Creatinine Clearance: 79.1 mL/min (by C-G formula based on SCr of 1.01 mg/dL). Liver Function Tests: Recent Labs  Lab 03/08/21 0037 03/09/21 0040 03/10/21 0503 03/11/21 0355 03/12/21 0825  AST _1 ALT 46* 50* 45* 46* 38  ALKPHOS 122 110 97 107 92  BILITOT 0.5 0.5 0.6 0.6  1.0  PROT 5.6* 5.6* 5.4* 5.8* 6.1*  ALBUMIN 2.0* 2.2* 2.2* 2.5* 2.8*   No results for input(s): LIPASE, AMYLASE in the last 168 hours. No results for input(s): AMMONIA in the last 168 hours. Coagulation Profile: No results for input(s): INR, PROTIME in the last 168 hours. Cardiac Enzymes: No results for input(s): CKTOTAL, CKMB, CKMBINDEX, TROPONINI in the last 168 hours. BNP (last 3 results) No results for input(s): PROBNP in the last 8760 hours. HbA1C: No results for input(s): HGBA1C in the last 72 hours. CBG: No results for input(s): GLUCAP in the last 168 hours. Lipid Profile: No results for input(s): CHOL, HDL, LDLCALC, TRIG, CHOLHDL, LDLDIRECT in the last 72 hours. Thyroid Function Tests: No results for input(s): TSH, T4TOTAL, FREET4, T3FREE, THYROIDAB in the last 72 hours. Anemia Panel: Recent Labs    03/12/21 0825 03/13/21 0155  FERRITIN 464* 451*   Sepsis Labs: Recent Labs  Lab 03/07/21 0251 03/08/21 0037  PROCALCITON 0.19 0.16    Recent Results (from the past 240 hour(s))  Blood culture (routine x 2)     Status: None   Collection Time: 03/03/21  1:58 PM   Specimen: BLOOD  Result Value Ref Range Status   Specimen Description   Final    BLOOD LEFT ANTECUBITAL Performed at [facility], [place] [address], [place], [place] [address]    Special Requests   Final    BOTTLES DRAWN AEROBIC AND ANAEROBIC Blood Culture adequate volume Performed at [place] [address], [place], [place] [address]    Culture   Final    NO GROWTH 5 DAYS Performed at [facility], [place] [address] [place], [place] [address]    Report Status 03/08/2021 FINAL  Final  Resp Panel by RT-PCR (Flu A&B, Covid) Nasopharyngeal Swab     Status: Abnormal   Collection Time: 03/03/21  3:08 PM   Specimen: Nasopharyngeal Swab; Nasopharyngeal(NP) swabs in vial transport  medium  Result Value Ref Range Status   SARS Coronavirus 2 by RT PCR POSITIVE (A)  NEGATIVE Final    Comment: RESULT CALLED TO, READ BACK BY AND VERIFIED WITH: BOWEN,M. RN AT 1628 03/03/21 MULLINS,T (NOTE) SARS-CoV-2 target nucleic acids are DETECTED.  The SARS-CoV-2 RNA is generally detectable in upper respiratory specimens during the acute phase of infection. Positive results are indicative of the presence of the identified virus, but do not rule out bacterial infection or co-infection with other pathogens not detected by the test. Clinical correlation with patient history and other diagnostic information is necessary to determine patient infection status. The expected result is Negative.  Fact Sheet for Patients: EntrepreneurPulse.com.au  Fact Sheet for Healthcare Providers: IncredibleEmployment.be  This test is not yet approved or cleared by the [place] FDA and  has been authorized for detection and/or diagnosis of SARS-CoV-2 by FDA under an Emergency Use Authorization (EUA).  This EUA will remain in effect (meaning this test c an be used) for the duration of  the COVID-19 declaration under Section 564(b)(1) of the Act, 21 U.S.C. section 360bbb-3(b)(1), unless the authorization is terminated or revoked sooner.     Influenza A by PCR NEGATIVE NEGATIVE Final   Influenza B by PCR NEGATIVE NEGATIVE Final    Comment: (NOTE) The Xpert Xpress SARS-CoV-2/FLU/RSV plus assay is intended as an aid in the diagnosis of influenza from Nasopharyngeal swab specimens and should not be used as a sole basis for treatment. Nasal washings and aspirates are unacceptable for Xpert Xpress SARS-CoV-2/FLU/RSV testing.  Fact Sheet for Patients: EntrepreneurPulse.com.au  Fact Sheet for Healthcare Providers: IncredibleEmployment.be  This test is not yet approved or cleared by the [place] FDA and has been authorized for detection and/or diagnosis of SARS-CoV-2 by FDA under an Emergency Use  Authorization (EUA). This EUA will remain in effect (meaning this test can be used) for the duration of the COVID-19 declaration under Section 564(b)(1) of the Act, 21 U.S.C. section 360bbb-3(b)(1), unless the authorization is terminated or revoked.  Performed at [facility], [place] [address], [place], [place] [address]   Urine culture     Status: None   Collection Time: 03/03/21  3:08 PM   Specimen: In/Out Cath Urine  Result Value Ref Range Status   Specimen Description   Final    IN/OUT CATH URINE Performed at [place] [address], [place], [place] [address]    Special Requests   Final    NONE Performed at [facility], [place] [address], [place], [place] [address]    Culture   Final    NO GROWTH Performed at [facility], [place] [address], [place], [place] [address]    Report Status 03/04/2021 FINAL  Final  Blood culture (routine x 2)     Status: None   Collection Time: 03/03/21  3:41 PM   Specimen: BLOOD  Result Value Ref Range Status   Specimen Description   Final    BLOOD RIGHT ANTECUBITAL Performed at [place] [address] [place], [place] [address]    Special Requests   Final    BOTTLES DRAWN AEROBIC AND ANAEROBIC Blood Culture adequate volume Performed at [place] [address] [place], [place] [address]    Culture   Final    NO GROWTH 5 DAYS Performed at [facility], [place] [address] [place], [place] [address]    Report Status 03/08/2021 FINAL  Final  MRSA PCR Screening     Status: Abnormal   Collection Time: 03/04/21 11:53 PM   Specimen: Nasopharyngeal  Result Value Ref Range Status   MRSA by PCR POSITIVE (A) NEGATIVE Final    Comment:        The GeneXpert MRSA Assay (FDA approved for NASAL specimens only), is one component of a comprehensive MRSA colonization surveillance program. It is not intended to diagnose MRSA infection  nor to guide or monitor treatment for MRSA infections. RESULT CALLED TO, READ BACK BY AND VERIFIED WITH: [facility] RN AT 0121 03/05/2021 MITCHELL,L Performed at [facility], [place] [address] [place], [place] [address]          Radiology Studies: CT ANGIO CHEST PE W OR WO CONTRAST  Result Date: 03/12/2021 CLINICAL DATA:  COVID pneumonia. Elevated D-dimer. Worsening shortness of breath EXAM: CT ANGIOGRAPHY CHEST WITH CONTRAST TECHNIQUE: Multidetector CT imaging of the chest was performed using the standard protocol during bolus administration of intravenous contrast. Multiplanar CT image reconstructions and MIPs were obtained to evaluate the vascular anatomy. CONTRAST:  83m OMNIPAQUE IOHEXOL 350 MG/ML SOLN COMPARISON:  03/02/2021 FINDINGS: Cardiovascular: No filling defects in the pulmonary arteries to suggest pulmonary emboli. Heart is mildly enlarged. Aorta normal caliber. Scattered calcifications in the aorta. Mediastinum/Nodes: Borderline size mediastinal lymph nodes diffusely, likely reactive. Trachea and esophagus are unremarkable. Thyroid unremarkable. Lungs/Pleura: Extensive bilateral airspace disease, worsening significantly since prior study particularly in the mid and upper lung zones. Large bilateral pleural effusions. Upper Abdomen: Imaging into the upper abdomen demonstrates no acute findings. Musculoskeletal: Chest wall soft tissues are unremarkable. No acute bony abnormality. Review of the MIP images confirms the above findings. IMPRESSION: Extensive bilateral airspace disease, particularly in the mid and upper lung fields most compatible with pneumonia or ARDS. Large bilateral pleural effusions. Cardiomegaly. No evidence of pulmonary embolus. Electronically Signed   By: KRolm BaptiseM.D.   On: 03/12/2021 03:00   [place]KG SITE RITE  Result Date: 03/12/2021 If Site Rite image not attached, placement could not be confirmed due to current cardiac rhythm.       Scheduled  Meds: . vitamin C  500 mg Oral Daily  . Chlorhexidine Gluconate Cloth  6 each Topical Daily  . cholecalciferol  1,000 Units Oral Daily  . enoxaparin (LOVENOX) injection  40 mg Subcutaneous Q24H  . feeding supplement  237 mL Oral BID BM  . levothyroxine  175 mcg Oral QAC breakfast  . lidocaine      . mouth rinse  15 mL Mouth Rinse BID  . methylPREDNISolone (SOLU-MEDROL) injection  60 mg Intravenous Q12H  . multivitamin with minerals  1 tablet Oral Daily  . pantoprazole  40 mg Oral Daily  . traZODone  50 mg Oral QHS  . zinc sulfate  220 mg Oral Daily   Continuous Infusions: . sodium chloride 10 mL/hr at 03/06/21 0000  . sodium chloride Stopped (03/05/21 0323)  . sodium chloride 20 mL/hr at 03/08/21 1933  . ampicillin (OMNIPEN) IV 2 g (03/13/21 1220)  . cefTRIAXone (ROCEPHIN)  IV 2 g (03/13/21 1103)     LOS: 10 days    Time spent:40 min    Alvenia Treese, CGeraldo Docker MD [facility]   If 7PM-7AM, please contact night-coverage 03/13/2021, 1:14 PM

## 2021-03-13 NOTE — Progress Notes (Signed)
Peripherally Inserted Central Catheter Placement  The IV Nurse has discussed with the patient and/or persons authorized to consent for the patient, the purpose of this procedure and the potential benefits and risks involved with this procedure.  The benefits include less needle sticks, lab draws from the catheter, and the patient may be discharged home with the catheter. Risks include, but not limited to, infection, bleeding, blood clot (thrombus formation), and puncture of an artery; nerve damage and irregular heartbeat and possibility to perform a PICC exchange if needed/ordered by physician.  Alternatives to this procedure were also discussed.  Bard Power PICC patient education guide, fact sheet on infection prevention and patient information card has been provided to patient /or left at bedside.    PICC Placement Documentation  PICC Single Lumen 03/13/21 Right Basilic 39 cm 0 cm (Active)  Indication for Insertion or Continuance of Line Prolonged intravenous therapies 03/13/21 0930  Exposed Catheter (cm) 0 cm 03/13/21 0930  Site Assessment Clean;Dry;Intact 03/13/21 0930  Line Status Flushed;Blood return noted;Saline locked 03/13/21 0930  Dressing Type Transparent 03/13/21 0930  Dressing Status Clean;Dry;Intact 03/13/21 0930  Antimicrobial disc in place? Yes 03/13/21 0930  Dressing Change Due 03/20/21 03/13/21 0930       Audrie Gallus 03/13/2021, 10:22 AM

## 2021-03-14 ENCOUNTER — Inpatient Hospital Stay (HOSPITAL_COMMUNITY): Payer: Medicare Other | Admitting: Anesthesiology

## 2021-03-14 ENCOUNTER — Encounter (HOSPITAL_COMMUNITY)
Admission: EM | Disposition: A | Discharge status: Other Acute Inpatient Hospital | Payer: Self-pay | Source: Home / Self Care | Attending: Internal Medicine

## 2021-03-14 ENCOUNTER — Encounter (HOSPITAL_COMMUNITY): Payer: Self-pay | Admitting: Internal Medicine

## 2021-03-14 ENCOUNTER — Inpatient Hospital Stay (HOSPITAL_COMMUNITY): Payer: Medicare Other

## 2021-03-14 DIAGNOSIS — I38 Endocarditis, valve unspecified: Secondary | ICD-10-CM

## 2021-03-14 DIAGNOSIS — I340 Nonrheumatic mitral (valve) insufficiency: Secondary | ICD-10-CM

## 2021-03-14 DIAGNOSIS — J9601 Acute respiratory failure with hypoxia: Secondary | ICD-10-CM | POA: Diagnosis not present

## 2021-03-14 DIAGNOSIS — I4891 Unspecified atrial fibrillation: Secondary | ICD-10-CM | POA: Diagnosis not present

## 2021-03-14 DIAGNOSIS — U071 COVID-19: Secondary | ICD-10-CM | POA: Diagnosis not present

## 2021-03-14 DIAGNOSIS — I059 Rheumatic mitral valve disease, unspecified: Secondary | ICD-10-CM | POA: Diagnosis not present

## 2021-03-14 DIAGNOSIS — R7881 Bacteremia: Secondary | ICD-10-CM | POA: Diagnosis not present

## 2021-03-14 HISTORY — PX: TEE WITHOUT CARDIOVERSION: SHX5443

## 2021-03-14 LAB — CBC WITH DIFFERENTIAL/PLATELET
Abs Immature Granulocytes: 0.12 10*3/uL — ABNORMAL HIGH (ref 0.00–0.07)
Basophils Absolute: 0 10*3/uL (ref 0.0–0.1)
Basophils Relative: 0 %
Eosinophils Absolute: 0 10*3/uL (ref 0.0–0.5)
Eosinophils Relative: 0 %
HCT: 31.9 % — ABNORMAL LOW (ref 39.0–52.0)
Hemoglobin: 10.2 g/dL — ABNORMAL LOW (ref 13.0–17.0)
Immature Granulocytes: 1 %
Lymphocytes Relative: 5 %
Lymphs Abs: 0.6 10*3/uL — ABNORMAL LOW (ref 0.7–4.0)
MCH: 30.5 pg (ref 26.0–34.0)
MCHC: 32 g/dL (ref 30.0–36.0)
MCV: 95.5 fL (ref 80.0–100.0)
Monocytes Absolute: 0.6 10*3/uL (ref 0.1–1.0)
Monocytes Relative: 5 %
Neutro Abs: 10.9 10*3/uL — ABNORMAL HIGH (ref 1.7–7.7)
Neutrophils Relative %: 89 %
Platelets: 387 10*3/uL (ref 150–400)
RBC: 3.34 MIL/uL — ABNORMAL LOW (ref 4.22–5.81)
RDW: 14.9 % (ref 11.5–15.5)
WBC: 12.2 10*3/uL — ABNORMAL HIGH (ref 4.0–10.5)
nRBC: 0 % (ref 0.0–0.2)

## 2021-03-14 LAB — C-REACTIVE PROTEIN: CRP: 1.8 mg/dL — ABNORMAL HIGH (ref <1.0)

## 2021-03-14 LAB — PATHOLOGIST SMEAR REVIEW

## 2021-03-14 LAB — PHOSPHORUS: Phosphorus: 3 mg/dL (ref 2.5–4.6)

## 2021-03-14 LAB — LACTATE DEHYDROGENASE: LDH: 141 U/L (ref 98–192)

## 2021-03-14 LAB — FERRITIN: Ferritin: 349 ng/mL — ABNORMAL HIGH (ref 24–336)

## 2021-03-14 LAB — ECHO TEE: Radius: 1.6 cm

## 2021-03-14 LAB — D-DIMER, QUANTITATIVE: D-Dimer, Quant: 1.7 ug/mL-FEU — ABNORMAL HIGH (ref 0.00–0.50)

## 2021-03-14 LAB — MAGNESIUM: Magnesium: 2.2 mg/dL (ref 1.7–2.4)

## 2021-03-14 SURGERY — ECHOCARDIOGRAM, TRANSESOPHAGEAL
Anesthesia: Monitor Anesthesia Care

## 2021-03-14 MED ORDER — PREDNISONE 20 MG PO TABS
40.0000 mg | ORAL_TABLET | Freq: Every day | ORAL | Status: DC
  Administered 2021-03-15: 40 mg via ORAL
  Filled 2021-03-14: qty 2

## 2021-03-14 MED ORDER — FUROSEMIDE 40 MG PO TABS
40.0000 mg | ORAL_TABLET | Freq: Every day | ORAL | Status: DC
  Administered 2021-03-15: 40 mg via ORAL
  Filled 2021-03-14: qty 1

## 2021-03-14 MED ORDER — PROPOFOL 500 MG/50ML IV EMUL
INTRAVENOUS | Status: DC | PRN
  Administered 2021-03-14: 20 mg via INTRAVENOUS
  Administered 2021-03-14: 100 ug/kg/min via INTRAVENOUS

## 2021-03-14 MED ORDER — POLYETHYLENE GLYCOL 3350 17 G PO PACK
17.0000 g | PACK | Freq: Once | ORAL | Status: AC
  Administered 2021-03-14: 17 g via ORAL

## 2021-03-14 MED ORDER — BUTAMBEN-TETRACAINE-BENZOCAINE 2-2-14 % EX AERO
INHALATION_SPRAY | CUTANEOUS | Status: DC | PRN
  Administered 2021-03-14: 2 via TOPICAL

## 2021-03-14 NOTE — Progress Notes (Signed)
Day of Surgery Procedure(s) (LRB): TRANSESOPHAGEAL ECHOCARDIOGRAM (TEE) (N/A) Subjective: Feels well after TEE Still SOB with exertion but stable on 2L [place]  Objective: Vital signs in last 24 hours: Temp:  [97.5 F (36.4 C)-98.1 F (36.7 C)] 98 F (36.7 C) (04/20 1622) Pulse Rate:  [75-98] 98 (04/20 1622) Cardiac Rhythm: Normal sinus rhythm (04/20 0700) Resp:  [16-20] 18 (04/20 1622) BP: (97-130)/(63-76) 123/63 (04/20 1622) SpO2:  [94 %-98 %] 98 % (04/20 1622) Weight:  [85.3 kg] 85.3 kg (04/20 1258)  Hemodynamic parameters for last 24 hours:    Intake/Output from previous day: 04/19 0701 - 04/20 0700 In: 480 [P.O.:480] Out: 2425 [Urine:2425] Intake/Output this shift: Total I/O In: 100 [I.V.:100] Out: -   General appearance: alert, cooperative and no distress Heart: regular rate and rhythm and + systolic murmur Lungs: rales bibasilar Extremities: Edema in ankles/ feet bilaterally  Lab Results: Recent Labs    03/13/21 0155 03/14/21 0158  WBC 19.9* 12.2*  HGB 11.8* 10.2*  HCT 36.2* 31.9*  PLT 469* 387   BMET:  Recent Labs    03/12/21 0825 03/13/21 0155  NA 140 137  K 4.7 3.3*  CL 100 95*  CO2 33* 32  GLUCOSE 138* 126*  BUN 22 25*  CREATININE 1.14 1.01  CALCIUM 8.9 9.1    PT/INR: No results for input(s): LABPROT, INR in the last 72 hours. ABG    Component Value Date/Time   TCO2 27 03/30/2016 1130   CBG (last 3)  No results for input(s): GLUCAP in the last 72 hours.  Assessment/Plan: S/P Procedure(s) (LRB): TRANSESOPHAGEAL ECHOCARDIOGRAM (TEE) (N/A) -Reviewed TEE films. Has a large vegetation with severe MR. 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 I do not think he would survive MVR under these circumstances. Reviewed CT images with Dr. Cornelius Moras and he agrees  If his respiratory status deteriorates, ECMO may be an option. For now he continues to do amazingly  well under the circumstances   LOS: 11 days    Loreli Slot 03/14/2021

## 2021-03-14 NOTE — Progress Notes (Addendum)
[facility]    Date of Admission:  03/03/2021   Total days of antibiotics 12/amp/ceftriaxone           ID: Brandon [name] is a 70 y.o. male with covid-19 pneumonia (reinfection from aug 2021) , and enterococcal native MV endocarditis Principal Problem:   Endocarditis of mitral valve Active Problems:   Sepsis (HCC)   Bacteremia   COVID-19 virus infection   Osler node   Acute respiratory failure with hypoxia (HCC)   Pneumonia due to COVID-19 virus   CAP (community acquired pneumonia)   Elevated liver enzymes    Subjective: Feels improved since having thoracentesis. He is afebrile. Less short of breath, improvement with orthopnea. +constipation. Still some swelling to lower extremities  ROS: No fever, chills, nightsweats. N/V/D  Medications:  . vitamin C  500 mg Oral Daily  . Chlorhexidine Gluconate Cloth  6 each Topical Daily  . cholecalciferol  1,000 Units Oral Daily  . enoxaparin (LOVENOX) injection  40 mg Subcutaneous Q24H  . feeding supplement  237 mL Oral BID BM  . levothyroxine  175 mcg Oral QAC breakfast  . mouth rinse  15 mL Mouth Rinse BID  . multivitamin with minerals  1 tablet Oral Daily  . pantoprazole  40 mg Oral Daily  . polyethylene glycol  17 g Oral Once  . [START ON 03/15/2021] predniSONE  40 mg Oral Q breakfast  . traZODone  50 mg Oral QHS  . zinc sulfate  220 mg Oral Daily    Objective: Vital signs in last 24 hours: Temp:  [97.5 F (36.4 C)-98.1 F (36.7 C)] 97.5 F (36.4 C) (04/20 0826) Pulse Rate:  [75-94] 75 (04/20 0826) Resp:  [16-20] 18 (04/20 0826) BP: (110-129)/(68-78) 114/73 (04/20 0826) SpO2:  [94 %-97 %] 95 % (04/20 0826)  Physical Exam  Constitutional: He is oriented to person, place, and time. He appears well-developed and well-nourished. No distress.  HENT:  Mouth/Throat: Oropharynx is clear and moist. No oropharyngeal exudate.  Cardiovascular: Normal rate, regular rhythm and normal heart sounds. Soft  SEM 3/6 Pulmonary/Chest: Effort normal and breath sounds normal. No respiratory distress. He has no wheezes.  Abdominal: Soft. Bowel sounds are normal. He exhibits no distension. There is no tenderness.  RKY:HCWCBJSE,+GBTDVVOH hemorrhage Skin: Skin is warm and dry. No rash noted. No erythema.  Psychiatric: He has a normal mood and affect. His behavior is normal.    Lab Results Recent Labs    03/12/21 0825 03/13/21 0155 03/14/21 0158  WBC 10.0 19.9* 12.2*  HGB 11.2* 11.8* 10.2*  HCT 35.0* 36.2* 31.9*  NA 140 137  --   K 4.7 3.3*  --   CL 100 95*  --   CO2 33* 32  --   BUN 22 25*  --   CREATININE 1.14 1.01  --    Liver Panel Recent Labs    03/12/21 0825 03/13/21 1915  PROT 6.1* 6.2*  ALBUMIN 2.8* 3.2*  AST 19  --   ALT 38  --   ALKPHOS 92  --   BILITOT 1.0  --    Sedimentation Rate No results for input(s): ESRSEDRATE in the last 72 hours. C-Reactive Protein Recent Labs    03/13/21 0155 03/14/21 0158  CRP 4.4* 1.8*    Microbiology: 4/9 blood cx NGTD 4/8 blood cx enterococcal faecalis  Enterococcus faecalis      MIC    AMPICILLIN <=2 SENSITIVE  Sensitive    GENTAMICIN SYNERGY SENSITIVE  Sensitive    VANCOMYCIN 1 SENSITIVE  Sensitive     Studies/Results: DG Chest 1 View  Result Date: 03/13/2021 CLINICAL DATA:  Status post left thoracentesis. EXAM: CHEST  1 VIEW COMPARISON:  CT 03/12/2021.  Chest x-ray 03/08/2021. FINDINGS: Stable cardiomegaly. Bilateral prominent pulmonary infiltrates, predominantly in the upper lobes, are again noted. Tiny bilateral pleural effusions cannot be excluded. Very tiny questionable left apical pneumothorax noted following thoracentesis. Postsurgical changes left clavicle. Surgical clips noted over the neck. IMPRESSION: 1. Very questionable tiny left apical pneumothorax post thoracentesis. 2. Persistent prominent bilateral pulmonary infiltrates, predominantly in the upper lobes, again noted. Critical Value/emergent results were called  by telephone at the time of interpretation on 03/13/2021 at 2:07 pm to provider [facility] , who verbally acknowledged these results. Electronically Signed   By: Maisie Fus  Register   On: 03/13/2021 14:07   [place] EKG SITE RITE  Result Date: 03/12/2021 If Site Rite image not attached, placement could not be confirmed due to current cardiac rhythm.  IR THORACENTESIS ASP PLEURAL SPACE W/IMG GUIDE  Result Date: 03/13/2021 INDICATION: COVID pneumonia. Shortness of breath. Right-sided pleural effusion. Request for diagnostic therapeutic thoracentesis. EXAM: ULTRASOUND GUIDED RIGHT THORACENTESIS MEDICATIONS: 1% plain lidocaine, 8 mL COMPLICATIONS: None immediate. PROCEDURE: An ultrasound guided thoracentesis was thoroughly discussed with the patient and questions answered. The benefits, risks, alternatives and complications were also discussed. The patient understands and wishes to proceed with the procedure. Written consent was obtained. Ultrasound was performed to localize and mark an adequate pocket of fluid in the right chest. The area was then prepped and draped in the normal sterile fashion. 1% Lidocaine was used for local anesthesia. Under ultrasound guidance a 6 Fr Safe-T-Centesis catheter was introduced. Thoracentesis was performed. The catheter was removed and a dressing applied. FINDINGS: A total of approximately 420 mL of slightly hazy yellow fluid was removed. Samples were sent to the laboratory as requested by the clinical team. IMPRESSION: Successful ultrasound guided right thoracentesis yielding 420 mL of pleural fluid. Read by: Brayton El PA-C Electronically Signed   By: Marliss Coots MD   On: 03/13/2021 14:08     Assessment/Plan: covid pneumonia = received IV remdesivir plus on steroids. Still having some mild hypoxia, he reports o2 sats at 90% after moving around his room. Recommend to ambulate with PT to see if need oxygen. Can come off of airborne/contact isolation since he is beyond 10  days iso from dx (for mild-mod disease) does not need 21 d isolation  Enterococcal MV endocarditis = will plan for 6 wk of ampicillin plus ceftriaxone (dual beta lactam regimen). Currently day 12 of 42. Has upcoming TEE today at 2pm for evaluation of valve and further work up needed if needs valve replacement  Pleural effusion = underwent left sided thoracentesis, appears improved symptoms. Will follow up on cultures  Leukocytosis = improved.   LE edema = benefits from diuretics, but also need to see if needs potassium supplementation  Constipation = he reports small BM today, would follow up to see if needs miralax.  Will see him back in clinic in 2 wk as well as at conclusion of IV abtx.  [facility] Cell: [phone] Pager: [phone]  03/14/2021, 11:52 AM

## 2021-03-14 NOTE — Consult Note (Addendum)
Advanced Heart Failure Team Consult Note   Primary Physician: Soundra Pilon, FNP PCP-Cardiologist:  No primary care provider on file.  Reason for Consultation: Mitral Valve Endocarditis   HPI:    ARVIND MEXICANO is seen today for evaluation of mitral valve endocarditis at the request of Dr. Jerral Ralph, Internal Medicine.   70 y/o male w/ h/o hypothyroidism and  prior COVID infection 08/2020. He first presented to the ED on 4/8 w/ CC of fevers, SOB and productive cough. Was COVID + . CXR showed RUL PNA and admission was recommended but pt initially declined and was discharged from ED on Augmentin and azithromycin. Unfortunately, blood cultures that had been obtained in the ED returned + for enterococcus. He was contacted and advised to return to [facility] for admission.   He has been placed on IV abx. ID following. On Ampicillin and Rocephin. Also treated w/ steroids and remdesivir. 2D echo showed normal LVEF 60-65% w/ mitral valve vegetation + moderate MR. TEE performed earlier today and demonstrated Flail A2 segment with perforation at the base of A2 and large  vegetation 2.2 cm on ventricular side extending into papillary muscle. Intervalvular fibrosa intact.  Also appears to be small vegetation on P2. Torrential MVR. LVEF normal. He will need surgical repair.     TEE 03/14/21 1. Left ventricular ejection fraction, by estimation, is 55 to 60%. The  left ventricle has normal function.  2. Right ventricular systolic function is normal. The right ventricular  size is normal.  3. Left atrial size was mildly dilated. No left atrial/left atrial  appendage thrombus was detected.  4. Flail A2 segment with perforation at the base of A2 and large  vegetation 2.2 cm on ventricular side extending into papillary muscle.  Intervalvular fibrosa intact Also appears to be small vegetation on P2.  Findings discussed with CVTS Dr Dorris Fetch   Extensive 3D imaging of MV was performed . The mitral  valve is  abnormal. 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  5. The aortic valve is tricuspid. There is mild calcification of the  aortic valve. Aortic valve regurgitation is trivial. Mild aortic valve  sclerosis is present, with no evidence of aortic valve stenosis.   Review of Systems: [y] = yes, [ ]  = no   . General: Weight gain [ ] ; Weight loss [ ] ; Anorexia [ ] ; Fatigue [ ] ; Fever [Y ]; Chills [Y ]; Weakness [ ]   . Cardiac: Chest pain/pressure [ ] ; Resting SOB [Y ]; Exertional SOB [ ] ; Orthopnea [ ] ; Pedal Edema [ ] ; Palpitations [ ] ; Syncope [ ] ; Presyncope [ ] ; Paroxysmal nocturnal dyspnea[ ]   . Pulmonary: Cough [Y ]; Wheezing[ ] ; Hemoptysis[ ] ; Sputum [ ] ; Snoring [ ]   . GI: Vomiting[ ] ; Dysphagia[ ] ; Melena[ ] ; Hematochezia [ ] ; Heartburn[ ] ; Abdominal pain [ ] ; Constipation [ ] ; Diarrhea [ ] ; BRBPR [ ]   . GU: Hematuria[ ] ; Dysuria [ ] ; Nocturia[ ]   . Vascular: Pain in legs with walking [ ] ; Pain in feet with lying flat [ ] ; Non-healing sores [ ] ; Stroke [ ] ; TIA [ ] ; Slurred speech [ ] ;  . Neuro: Headaches[ ] ; Vertigo[ ] ; Seizures[ ] ; Paresthesias[ ] ;Blurred vision [ ] ; Diplopia [ ] ; Vision changes [ ]   . Ortho/Skin: Arthritis [ ] ; Joint pain [ ] ; Muscle pain [ ] ; Joint swelling [ ] ; Back Pain [ ] ; Rash [ ]   . Psych: Depression[ ] ; Anxiety[ ]   . Heme: Bleeding problems [ ] ; Clotting disorders [ ] ;  Anemia   . Endocrine: Diabetes ; Thyroid dysfunction[ ]   Home Medications Prior to Admission medications   Medication Sig Start Date End Date Taking? Authorizing Provider  acetaminophen (TYLENOL) 500 MG tablet Take 1,000 mg by mouth every 6 (six) hours as needed for fever.   Yes [provider]  amoxicillin-clavulanate (AUGMENTIN) 875-125 MG tablet Take 1 tablet by mouth every 12 (twelve) hours. Start tomorrow at 03/03/21 Patient taking differently: Take 1 tablet by mouth every 12 (twelve) hours. Start date: at 03/03/21 03/02/21  Yes Gwyneth Sprout, MD  azithromycin  (ZITHROMAX Z-PAK) 250 MG tablet Take 1 tablet (250 mg total) by mouth daily. Start tomorrow 03/03/21 Patient taking differently: Take 250 mg by mouth daily. Start Date: 03/03/21 03/02/21  Yes Gwyneth Sprout, MD  cholecalciferol (VITAMIN D) 1000 units tablet Take 1,000 Units by mouth daily.   Yes [provider]  diphenhydrAMINE (BENADRYL) 50 MG capsule Take 50 mg by mouth every 6 (six) hours as needed for allergies or itching.   Yes [provider]  fluticasone (FLONASE) 50 MCG/ACT nasal spray Place 1 spray into both nostrils daily as needed for allergies. 03/11/12  Yes [provider]  levothyroxine (SYNTHROID) 175 MCG tablet Take 175 mcg by mouth daily. 09/14/20  Yes [provider]  predniSONE (DELTASONE) 10 MG tablet Take 10 mg by mouth daily with breakfast.   Yes [provider]  Probiotic Product (PROBIOTIC-10 PO) Take 1 capsule by mouth daily.    Yes [provider]  testosterone cypionate (DEPOTESTOSTERONE CYPIONATE) 200 MG/ML injection Inject 200 mg into the muscle once a week. 08/10/19  Yes [provider]  triamterene-hydrochlorothiazide (DYAZIDE) 37.5-25 MG capsule Take 1 capsule by mouth daily.   Yes [provider]  predniSONE (DELTASONE) 50 MG tablet Pt to take 50 mg of prednisone on 03/02/21 at 2:40 AM, 50 mg of prednisone on 03/02/21 at 8:40 AM, and 50 mg of prednisone on 03/02/21 at 2:40 PM. Pt is also to take 50 mg of benadryl on 03/02/21 at 2:40 PM. Please call [phone] with any questions. Patient not taking: No sig reported 03/01/21   Sebastian Ache, MD    Past Medical History: Past Medical History:  Diagnosis Date  . Atrial fibrillation (HCC)   . BPH (benign prostatic hyperplasia)   . Cervical spine disease   . Chest pain 2007   none since  . Gallbladder disorder   . Hemopneumothorax on left   . Hiatal hernia   . Hypercholesteremia   . Hyperlipidemia   . Meniere disease   . Meniere's disease   . PONV  (postoperative nausea and vomiting)    also has trouble waking up    Past Surgical History: Past Surgical History:  Procedure Laterality Date  . CARDIAC CATHETERIZATION    . COLONOSCOPY    . IR THORACENTESIS ASP PLEURAL SPACE W/IMG GUIDE  03/13/2021  . ORIF CLAVICULAR FRACTURE Left 04/18/2016   Procedure: OPEN REDUCTION INTERNAL FIXATION (ORIF) LEFT CLAVICLE FRACTURE;  Surgeon: Francena Hanly, MD;  Location: [facility] OR;  Service: Orthopedics;  Laterality: Left;  . THYROIDECTOMY    . TONSILLECTOMY      Family History: Family History  Problem Relation Age of Onset  . Hypertension Mother   . Breast cancer Mother   . Heart disease Father   . Emphysema Father   . Heart attack Sister   . Heart attack Brother     Social History: Social History   Socioeconomic History  . Marital status:  Widowed    Spouse name: Not on file  . Number of children: Not on file  . Years of education: Not on file  . Highest education level: Not on file  Occupational History    Comment: factory work  Tobacco Use  . Smoking status: Former Smoker    Quit date: 04/18/1979    Years since quitting: 41.9  . Smokeless tobacco: Never Used  Vaping Use  . Vaping Use: Never used  Substance and Sexual Activity  . Alcohol use: Yes    Comment: 3 timesa a week (wine or beer)  . Drug use: No  . Sexual activity: Not on file  Other Topics Concern  . Not on file  Social History Narrative   Two level home alone   Right handed   Caffeine - one cup coffee am    Exercise - yes walk dogs   Education - 1 year of college    Social Determinants of Health   Financial Resource Strain: Not on file  Food Insecurity: Not on file  Transportation Needs: Not on file  Physical Activity: Not on file  Stress: Not on file  Social Connections: Not on file    Allergies:  Allergies  Allergen Reactions  . Ceftin [Cefuroxime Axetil] Diarrhea  . Oxycodone Nausea And Vomiting  . Iohexol     03/02/21 pt received prednisone and  benadryl 1hr before CT Abd with contrast, tolerated w/o issue 03/10/17 Patient started sneezing during the injection and continued sneezing after exam. ER doc notified and patient was given benadryl and cortisone    Objective:    Vital Signs:   Temp:  [97.5 F (36.4 C)-98.1 F (36.7 C)] 97.5 F (36.4 C) (04/20 1400) Pulse Rate:  [75-94] 86 (04/20 1420) Resp:  [16-20] 20 (04/20 1420) BP: (97-130)/(64-77) 121/66 (04/20 1420) SpO2:  [94 %-98 %] 95 % (04/20 1420) Weight:  [85.3 kg] 85.3 kg (04/20 1258) Last BM Date: 03/11/21  Weight change: Filed Weights   03/03/21 1835 03/14/21 1258  Weight: 85.3 kg 85.3 kg    Intake/Output:   Intake/Output Summary (Last 24 hours) at 03/14/2021 1606 Last data filed at 03/14/2021 1352 Gross per 24 hour  Intake 100 ml  Output --  Net 100 ml      Physical Exam    General:  Well appearing. No resp difficulty HEENT: normal Neck: supple. JVP . Carotids 2+ bilat; no bruits. No lymphadenopathy or thyromegaly appreciated. Cor: PMI nondisplaced. Regular rate & rhythm. No rubs, gallops or murmurs. Lungs: clear Abdomen: soft, nontender, nondistended. No hepatosplenomegaly. No bruits or masses. Good bowel sounds. Extremities: no cyanosis, clubbing, rash, edema Neuro: alert & orientedx3, cranial nerves grossly intact. moves all 4 extremities w/o difficulty. Affect pleasant   Telemetry   Sinus tach   EKG    Admit EKG sinus tach 107 bpm LAE   Labs   Basic Metabolic Panel: Recent Labs  Lab 03/09/21 0040 03/10/21 0503 03/11/21 0355 03/12/21 0825 03/13/21 0155 03/14/21 0158  NA 138 138 136 140 137  --   K 3.7 3.9 3.8 4.7 3.3*  --   CL 102 103 99 100 95*  --   CO2 33* 32  --   GLUCOSE 142* 156* 105* 138* 126*  --   BUN 32* 29* 25* 22 25*  --   CREATININE 0.97 1.03 0.96 1.14 1.01  --   CALCIUM 8.2* 8.2* 8.6* 8.9 9.1  --   MG 2.0 2.1 2.1 2.1 2.0 2.2  PHOS 3.3 4.1 2.9 5.6* 3.0 3.0    Liver Function Tests: Recent Labs  Lab  03/08/21 0037 03/09/21 0040 03/10/21 0503 03/11/21 0355 03/12/21 0825 03/13/21 1915  AST 26 28 23 23 19   --   ALT 46* 50* 45* 46* 38  --   ALKPHOS 122 110 97 107 92  --   BILITOT 0.5 0.5 0.6 0.6 1.0  --   PROT 5.6* 5.6* 5.4* 5.8* 6.1* 6.2*  ALBUMIN 2.0* 2.2* 2.2* 2.5* 2.8* 3.2*   No results for input(s): LIPASE, AMYLASE in the last 168 hours. No results for input(s): AMMONIA in the last 168 hours.  CBC: Recent Labs  Lab 03/10/21 0503 03/11/21 0355 03/12/21 0825 03/13/21 0155 03/14/21 0158  WBC 11.7* 13.9* 10.0 19.9* 12.2*  NEUTROABS 10.3* 10.1* 8.8* 18.2* 10.9*  HGB 10.5* 11.8* 11.2* 11.8* 10.2*  HCT 33.6* 37.2* 35.0* 36.2* 31.9*  MCV 94.6 95.9 94.9 94.0 95.5  PLT 452* 468* 428* 469* 387    Cardiac Enzymes: No results for input(s): CKTOTAL, CKMB, CKMBINDEX, TROPONINI in the last 168 hours.  BNP: BNP (last 3 results) Recent Labs    03/06/21 0955 03/07/21 0251 03/13/21 0155  BNP 142.6* 162.8* 331.0*    ProBNP (last 3 results) No results for input(s): PROBNP in the last 8760 hours.   CBG: No results for input(s): GLUCAP in the last 168 hours.  Coagulation Studies: No results for input(s): LABPROT, INR in the last 72 hours.   Imaging   ECHO TEE  Result Date: 03/14/2021    TRANSESOPHOGEAL ECHO REPORT   Patient Name:   TILAK OAKLEY Date of Exam: 03/14/2021 Medical Rec #:  03/16/2021       Height:       73.0 in Accession #:    [id number]      Weight:       188.1 lb Date of Birth:  1951/02/05       BSA:          2.096 m Patient Age:    69 years        BP:           114/73 mmHg Patient Gender: M               HR:           92 bpm. Exam Location:  Inpatient Procedure: Transesophageal Echo, 3D Echo, Cardiac Doppler and Color Doppler Indications:     Endocarditis  History:         Patient has prior history of Echocardiogram examinations, most                  recent 03/04/2021. Arrythmias:Atrial Fibrillation; Risk                  Factors:Dyslipidemia.  Sonographer:      05/04/2021 RDCS Referring Phys:  Eulah Pont [id number] Diagnosing Phys: Wendall Stade MD PROCEDURE: After discussion of the risks and benefits of a TEE, an informed consent was obtained from the patient. The transesophogeal probe was passed without difficulty through the esophogus of the patient. Sedation performed by different physician. The patient was monitored while under deep sedation. Anesthestetic sedation was provided intravenously by Anesthesiology: 228.99mg  of Propofol. The patient's vital signs; including heart rate, blood pressure, and oxygen saturation; remained stable throughout the procedure. The patient developed no complications during the procedure. IMPRESSIONS  1. Left ventricular ejection fraction, by estimation, is 55 to 60%. The left ventricle has normal function.  2. Right ventricular systolic function is normal. The right ventricular size is normal.  3. Left atrial size was mildly dilated. No left atrial/left atrial appendage thrombus was detected.  4. Flail A2 segment with perforation at the base of A2 and large vegetation 2.2 cm on ventricular side extending into papillary muscle. Intervalvular fibrosa intact Also appears to be small vegetation on P2. Findings discussed with CVTS Dr Dorris Fetch     Extensive 3D imaging of MV was performed . The mitral valve is abnormal. 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  5. The aortic valve is tricuspid. There is mild calcification of the aortic valve. Aortic valve regurgitation is trivial. Mild aortic valve sclerosis is present, with no evidence of aortic valve stenosis. FINDINGS  Left Ventricle: Left ventricular ejection fraction, by estimation, is 55 to 60%. The left ventricle has normal function. The left ventricular internal cavity size was normal in size. There is no left ventricular hypertrophy. Right Ventricle: The right ventricular size is normal. No increase in right ventricular wall thickness. Right ventricular systolic function is  normal. Left Atrium: Left atrial size was mildly dilated. No left atrial/left atrial appendage thrombus was detected. Right Atrium: Right atrial size was normal in size. Pericardium: There is no evidence of pericardial effusion. 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 Intervalvular fibrosa intact Also appears to be small vegetation on P2. Findings discussed with CVTS Dr Dorris Fetch Extensive 3D imaging of MV was performed. The mitral valve is abnormal. 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 Tricuspid Valve: The tricuspid valve is normal in structure. Tricuspid valve regurgitation is mild. Aortic Valve: The aortic valve is tricuspid. There is mild calcification of the aortic valve. Aortic valve regurgitation is trivial. Mild aortic valve sclerosis is present, with no evidence of aortic valve stenosis. Pulmonic Valve: The pulmonic valve was normal in structure. Pulmonic valve regurgitation is mild. Aorta: The aortic root is normal in size and structure. IAS/Shunts: No atrial level shunt detected by color flow Doppler.  MR PISA:        16.08 cm MR PISA Radius: 1.60 cm Charlton Haws MD Electronically signed by Charlton Haws MD Signature Date/Time: 03/14/2021/2:36:40 PM    Final       Medications:     Current Medications: . vitamin C  500 mg Oral Daily  . Chlorhexidine Gluconate Cloth  6 each Topical Daily  . cholecalciferol  1,000 Units Oral Daily  . enoxaparin (LOVENOX) injection  40 mg Subcutaneous Q24H  . feeding supplement  237 mL Oral BID BM  . levothyroxine  175 mcg Oral QAC breakfast  . mouth rinse  15 mL Mouth Rinse BID  . multivitamin with minerals  1 tablet Oral Daily  . pantoprazole  40 mg Oral Daily  . [START ON 03/15/2021] predniSONE  40 mg Oral Q breakfast  . traZODone  50 mg Oral QHS  . zinc sulfate  220 mg Oral Daily     Infusions: . sodium chloride 10 mL/hr at 03/06/21 0000   . sodium chloride Stopped (03/05/21 0323)  . ampicillin (OMNIPEN) IV 2 g (03/14/21 1501)  . 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   Assessment/Plan   1. 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 Intervalvular fibrosa intact. Also appears to be small vegetation on P2. Torrential MVR.  - He will need surgical repair  2. Acute COVID 19 Infection w/ COVID PNA - abx per above - on prednisone. Received Remdesivir   Length of Stay: [address]  Brittainy Simmons, PA-C  03/14/2021, 4:06 PM  Advanced Heart Failure Team Pager [phone] (M-F; 7a - 5p)  Please contact [facility] for night-coverage after hours (4p -7a ) and weekends on [email or web address]  Patient seen and examined with the above-signed Advanced Practice Provider and/or Housestaff. I personally reviewed laboratory data, imaging studies and relevant notes. I independently examined the patient and formulated the important aspects of the plan. I have edited the note to reflect any of my changes or salient points. I have personally discussed the plan with the patient and/or family.  70 y/o male with h/o PAF, HL, GERD. Developed COVID in 10/21 Subsequently continued to feel ill with fatigue and migratory arthritis. Saw Rheum and inflammatory markers elevated, Treated with steroids,   Now readmitted with fevers and cough. Found to have recurrent COVID infection with bilateral upper lobe airspace disease and enterococcal bacteremia.   TEE today showed normal LV and RV function with severe MV endocarditis with flail segment, perforation of anterior leaflet and severe MR..  Seen by TCTS and felt not  to be candidate for MVR currently due to severe airspace disease.  Currently feels comfortable. Mild SOB with ambulation but otherwise no symptoms.   General:  Sitting up in chair No resp difficulty HEENT: normal Neck: supple. no JVD. Carotids 2+ bilat; no bruits. No lymphadenopathy or thryomegaly appreciated. Cor: PMI nondisplaced. Regular rate & rhythm. No rubs, gallops 3/6 MR Lungs: mild crackles Abdomen: soft, nontender, nondistended. No hepatosplenomegaly. No bruits or masses. Good bowel sounds. Extremities: no cyanosis, + clubbing, 2 small splinter hemorrhages. No edema Neuro: alert & orientedx3, cranial nerves grossly intact. moves all 4 extremities w/o difficulty. Affect pleasant  Very difficult situation. He has enterococcal bacteremia with MV endocarditis with flail anterior leaflet with perforation. Although he feels remarkably well, he currently is not a surgical candidate for MVR due to severe airspace disease due to recurrent COVID.   The concern is that he may decompensate from his severe MR at any point while waiting for his airspace disease to improve.   [facility] with [facility] or staying here or even going home for a short time.  For now he has chosen to stay here. Will continue IV abx and follow closely. Will keep volume status low to reduce risk of flash pulmonary edema. Hopefully lungs will improve enough in next 1-2 weeks to permit surgery.   Arvilla Meresaniel Haru Anspaugh, MD  10:30 PM

## 2021-03-14 NOTE — Anesthesia Preprocedure Evaluation (Signed)
Anesthesia Evaluation  Patient identified by MRN, date of birth, ID band Patient awake    Reviewed: Allergy & Precautions, H&P , NPO status , Patient's Chart, lab work & pertinent test results  History of Anesthesia Complications (+) PONV  Airway Mallampati: II  TM Distance: >3 FB Neck ROM: Full    Dental no notable dental hx. (+) Teeth Intact, Dental Advisory Given   Pulmonary neg pulmonary ROS, former smoker,    Pulmonary exam normal breath sounds clear to auscultation       Cardiovascular negative cardio ROS   Rhythm:Regular Rate:Normal     Neuro/Psych negative neurological ROS  negative psych ROS   GI/Hepatic Neg liver ROS, hiatal hernia,   Endo/Other  Hypothyroidism   Renal/GU negative Renal ROS  negative genitourinary   Musculoskeletal   Abdominal   Peds  Hematology  (+) Blood dyscrasia, anemia ,   Anesthesia Other Findings   Reproductive/Obstetrics negative OB ROS                             Anesthesia Physical Anesthesia Plan  ASA: II  Anesthesia Plan: MAC   Post-op Pain Management:    Induction: Intravenous  PONV Risk Score and Plan: 2 and Propofol infusion and Treatment may vary due to age or medical condition  Airway Management Planned: Nasal Cannula  Additional Equipment:   Intra-op Plan:   Post-operative Plan:   Informed Consent: I have reviewed the patients History and Physical, chart, labs and discussed the procedure including the risks, benefits and alternatives for the proposed anesthesia with the patient or authorized representative who has indicated his/her understanding and acceptance.     Dental advisory given  Plan Discussed with: CRNA  Anesthesia Plan Comments:         Anesthesia Quick Evaluation

## 2021-03-14 NOTE — Progress Notes (Signed)
  Echocardiogram Echocardiogram Transesophageal has been performed.  Augustine Radar 03/14/2021, 2:16 PM

## 2021-03-14 NOTE — Consult Note (Signed)
   [facility] Inpatient Consult   03/14/2021  Brandon [name] 04-Dec-1950 [id number]   [facility] [[facility]]  Accountable Care Organization [ACO] Patient: [facility]    Patient screened for length of stay  hospitalization  to assess for barriers to care and for  potential [facility]  [[facility]] Care Management service needs for post hospital transition.  Review of patient's medical record reveals patient is in the [place] [facility] [place], this provider's office does the TOC follow up. Patient currently requiring oxygen noted.   Plan:  Continue to follow progress and disposition to assess for post hospital care management needs.    For questions contact:   Charlesetta Shanks, RN BSN CCM [facility]  [phone] business mobile phone Toll free office [phone]  Fax number: [phone] [email or web address]

## 2021-03-14 NOTE — Anesthesia Procedure Notes (Signed)
Procedure Name: MAC Date/Time: 03/14/2021 1:28 PM Performed by: Rande Brunt, CRNA Pre-anesthesia Checklist: Patient identified, Emergency Drugs available, Suction available and Patient being monitored Patient Re-evaluated:Patient Re-evaluated prior to induction Oxygen Delivery Method: Nasal cannula Preoxygenation: Pre-oxygenation with 100% oxygen Induction Type: IV induction Airway Equipment and Method: Bite block Placement Confirmation: positive ETCO2 and CO2 detector Dental Injury: Teeth and Oropharynx as per pre-operative assessment

## 2021-03-14 NOTE — Progress Notes (Signed)
PROGRESS NOTE                                                                                                                                                                                                             Patient Demographics:    Brandon [name], is a 70 y.o. male, DOB - 1951/01/24, ZOX:[id number]  Outpatient Primary MD for the patient is Soundra Pilon, FNP   Admit date - 03/03/2021   LOS - 11  Chief Complaint  Patient presents with  . Positive Blood Cultures       Brief Narrative: Patient is a 70 y.o. male with PMHx of COVID infection in October 2021-presented to the ED on 4/8 with fever, cough, shortness of breath-was found to have pneumonia-he refused admission and was discharged home on Augmentin and Zithromax-he returned later to the ED as his blood cultures were positive for Enterococcus.  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  COVID-19 vaccinated status: Unvaccinated  Significant Events: 4/9>> Admit to [facility] for pneumonia/enterococcal bacteremia/COVID-19 infection  Significant studies: 4/8>> CTA chest: No PE, right upper lobe pneumonia. 4/8>> CT abdomen: No acute abnormalities 4/10>> Echo: Mitral valve vegetation-moderate MR, EF 60-65% 4/18>> CTA chest: Extensive pneumonia, large bilateral pleural effusions.  No PE.  COVID-19 medications: Steroids: 4/12>> Remdesivir: 4/10>> 4/14  Antibiotics: Ampicillin: 4/9>> Rocephin: 4/9>>  Microbiology data: 4/8>> blood culture: Enterococcus faecalis 4/9>> blood culture: No growth 4/9>> urine culture: Negative 4/19>> pleural fluid culture: No growth  Procedures: 4/19>> right thoracocentesis by IR 4/19>> right PICC line   Consults: ID, cardiothoracic surgery, cardiology, IR  DVT prophylaxis: enoxaparin (LOVENOX) injection 40 mg Start: 03/13/21 0800 Place TED hose Start:  03/11/21 1757    Subjective:    Elmore Guise today feels better-on 2 L of oxygen.  No other complaints.   Assessment  & Plan :   Sepsis due to enterococcal bacteremia with mitral valve endocarditis: Sepsis physiology has resolved-cultures/antimicrobial therapy as above-plans are for TEE later today.  Will await further recommendations from ID/CTVS.  Acute hypoxic respiratory failure due to multifocal pneumonia from Enterococcus and COVID-19: Improved-only on 2 L of oxygen.  Will need to be assessed for home O2 requirement prior to discharge.  Taper down steroids over the next few days.  Per last ID note on  4/19-does not need isolation from 4/20.  Bilateral pleural effusions: Transudative by lights criteria.  Suspect this could be from hypoalbuminemia and probably diastolic CHF in the setting of mitral regurgitation.  Will need as needed dosing of Lasix.  Hypokalemia: Replete and recheck  Normocytic anemia: Due to acute illness-monitor CBC periodically.  No evidence of blood loss.  Hypothyroidism: Continue levothyroxine  Nutrition Problem: Nutrition Problem: Increased nutrient needs Etiology: acute illness Signs/Symptoms: estimated needs Interventions: Ensure Enlive (each supplement provides 350kcal and 20 grams of protein),MVI   GI prophylaxis: PPI  ABG:    Component Value Date/Time   TCO2 27 03/30/2016 1130    Vent Settings: N/A    Condition -Stable  Family Communication  : None at bedside.  Code Status :  Full Code  Diet :  Diet Order            Diet NPO time specified  Diet effective midnight                  Disposition Plan  :   Status is: Inpatient  Remains inpatient appropriate because:Inpatient level of care appropriate due to severity of illness   Dispo: The patient is from: Home              Anticipated d/c is to: Home              Patient currently is not medically stable to d/c.   Difficult to place patient No   Barriers to discharge:  Enterococcus bacteremia-mitral valve endocarditis-work-up in progress.  Antimicorbials  :    Anti-infectives (From admission, onward)   Start     Dose/Rate Route Frequency Ordered Stop   03/05/21 1000  remdesivir 100 mg in sodium chloride 0.9 % 100 mL IVPB       "Followed by" Linked Group Details   100 mg 200 mL/hr over 30 Minutes Intravenous Daily 03/04/21 0035 03/08/21 1024   03/04/21 1000  remdesivir 100 mg in sodium chloride 0.9 % 100 mL IVPB  Status:  Discontinued       "Followed by" Linked Group Details   100 mg 200 mL/hr over 30 Minutes Intravenous Daily 03/03/21 1806 03/04/21 0035   03/04/21 1000  remdesivir 200 mg in sodium chloride 0.9% 250 mL IVPB       "Followed by" Linked Group Details   200 mg 580 mL/hr over 30 Minutes Intravenous Once 03/04/21 0035 03/04/21 1230   03/03/21 2000  cefTRIAXone (ROCEPHIN) 2 g in sodium chloride 0.9 % 100 mL IVPB        2 g 200 mL/hr over 30 Minutes Intravenous Every 12 hours 03/03/21 1708     03/03/21 2000  remdesivir 200 mg in sodium chloride 0.9% 250 mL IVPB  Status:  Discontinued       "Followed by" Linked Group Details   200 mg 580 mL/hr over 30 Minutes Intravenous Once 03/03/21 1806 03/04/21 0035   03/03/21 1600  ampicillin (OMNIPEN) 2 g in sodium chloride 0.9 % 100 mL IVPB        2 g 300 mL/hr over 20 Minutes Intravenous Every 4 hours 03/03/21 1525        Inpatient Medications  Scheduled Meds: . vitamin C  500 mg Oral Daily  . Chlorhexidine Gluconate Cloth  6 each Topical Daily  . cholecalciferol  1,000 Units Oral Daily  . enoxaparin (LOVENOX) injection  40 mg Subcutaneous Q24H  . feeding supplement  237 mL Oral BID BM  . levothyroxine  175 mcg Oral QAC breakfast  . mouth rinse  15 mL Mouth Rinse BID  . methylPREDNISolone (SOLU-MEDROL) injection  60 mg Intravenous Q12H  . multivitamin with minerals  1 tablet Oral Daily  . pantoprazole  40 mg Oral Daily  . polyethylene glycol  17 g Oral Once  . traZODone  50 mg Oral QHS   . zinc sulfate  220 mg Oral Daily   Continuous Infusions: . sodium chloride 10 mL/hr at 03/06/21 0000  . sodium chloride Stopped (03/05/21 0323)  . sodium chloride 20 mL/hr at 03/08/21 1933  . ampicillin (OMNIPEN) IV 2 g (03/14/21 0846)  . cefTRIAXone (ROCEPHIN)  IV 2 g (03/14/21 1040)   PRN Meds:.sodium chloride, sodium chloride, acetaminophen **OR** acetaminophen, albuterol, chlorpheniramine-HYDROcodone, fluticasone, guaiFENesin-dextromethorphan, hydrALAZINE, lidocaine (PF), sodium chloride flush   Time Spent in minutes  25  See all Orders from today for further details   Jeoffrey Massed M.D on 03/14/2021 at 11:02 AM  To page go to [email or web address] - use universal password  [facility] -  Office  [phone]    Objective:   Vitals:   03/13/21 1947 03/14/21 0014 03/14/21 0420 03/14/21 0826  BP: 121/68 127/76 110/69 114/73  Pulse: 92 94 80 75  Resp: Temp: 98 F (36.7 C) 98.1 F (36.7 C) 98 F (36.7 C) (!) 97.5 F (36.4 C)  TempSrc: Oral Oral Oral Oral  SpO2: 97% 95% 95% 95%  Weight:      Height:        Wt Readings from Last 3 Encounters:  03/03/21 85.3 kg  03/02/21 86.4 kg  09/19/20 89.4 kg     Intake/Output Summary (Last 24 hours) at 03/14/2021 1102 Last data filed at 03/13/2021 1500 Gross per 24 hour  Intake 240 ml  Output 2225 ml  Net -1985 ml     Physical Exam Gen Exam:Alert awake-not in any distress HEENT:atraumatic, normocephalic Chest: B/L clear to auscultation anteriorly CVS:S1S2 regular Abdomen:soft non tender, non distended Extremities:+ edema Neurology: Non focal Skin: no rash   Data Review:    CBC Recent Labs  Lab 03/10/21 0503 03/11/21 0355 03/12/21 0825 03/13/21 0155 03/14/21 0158  WBC 11.7* 13.9* 10.0 19.9* 12.2*  HGB 10.5* 11.8* 11.2* 11.8* 10.2*  HCT 33.6* 37.2* 35.0* 36.2* 31.9*  PLT 452* 468* 428* 469* 387  MCV 94.6 95.9 94.9 94.0 95.5  MCH 29.6 30.4 30.4 30.6 30.5  MCHC 31.3 31.7 32.0 32.6 32.0   RDW 13.8 13.9 14.2 14.7 14.9  LYMPHSABS 0.8 2.3 0.8 0.8 0.6*  MONOABS 0.3 1.0 0.2 0.7 0.6  EOSABS 0.0 0.0 0.0 0.0 0.0  BASOSABS 0.0 0.0 0.0 0.0 0.0    Chemistries  Recent Labs  Lab 03/08/21 0037 03/09/21 0040 03/10/21 0503 03/11/21 0355 03/12/21 0825 03/13/21 0155 03/14/21 0158  NA 138 138 138 136 140 137  --   K 4.0 3.7 3.9 3.8 4.7 3.3*  --   CL 104 102 103 99 100 95*  --   CO2 33* 32  --   GLUCOSE 135* 142* 156* 105* 138* 126*  --   BUN 27* 32* 29* 25* 22 25*  --   CREATININE 0.91 0.97 1.03 0.96 1.14 1.01  --   CALCIUM 8.3* 8.2* 8.2* 8.6* 8.9 9.1  --   MG 2.1 2.0 2.1 2.1 2.1 2.0 2.2  AST --   --   ALT 46* 50* 45* 46* 38  --   --  ALKPHOS 122 110 97 107 92  --   --   BILITOT 0.5 0.5 0.6 0.6 1.0  --   --    ------------------------------------------------------------------------------------------------------------------ No results for input(s): CHOL, HDL, LDLCALC, TRIG, CHOLHDL, LDLDIRECT in the last 72 hours.  No results found for: HGBA1C ------------------------------------------------------------------------------------------------------------------ No results for input(s): TSH, T4TOTAL, T3FREE, THYROIDAB in the last 72 hours.  Invalid input(s): FREET3 ------------------------------------------------------------------------------------------------------------------ Recent Labs    03/13/21 0155 03/14/21 0158  FERRITIN 451* 349*    Coagulation profile No results for input(s): INR, PROTIME in the last 168 hours.  Recent Labs    03/13/21 0155 03/14/21 0158  DDIMER 1.63* 1.70*    Cardiac Enzymes No results for input(s): CKMB, TROPONINI, MYOGLOBIN in the last 168 hours.  Invalid input(s): CK ------------------------------------------------------------------------------------------------------------------    Component Value Date/Time   BNP 331.0 (H) 03/13/2021 0155    Micro Results Recent Results (from the past 240  hour(s))  MRSA PCR Screening     Status: Abnormal   Collection Time: 03/04/21 11:53 PM   Specimen: Nasopharyngeal  Result Value Ref Range Status   MRSA by PCR POSITIVE (A) NEGATIVE Final    Comment:        The GeneXpert MRSA Assay (FDA approved for NASAL specimens only), is one component of a comprehensive MRSA colonization surveillance program. It is not intended to diagnose MRSA infection nor to guide or monitor treatment for MRSA infections. RESULT CALLED TO, READ BACK BY AND VERIFIED WITH: [facility] RN AT 0121 03/05/2021 MITCHELL,L Performed at [facility], [address], [place], [place] [address]   Gram stain     Status: None   Collection Time: 03/13/21  2:30 PM   Specimen: Lung, Right; Pleural Fluid  Result Value Ref Range Status   Specimen Description FLUID PLEURAL  Final   Special Requests NONE  Final   Gram Stain   Final    WBC PRESENT, PREDOMINANTLY MONONUCLEAR NO ORGANISMS SEEN CYTOSPIN SMEAR Performed at [facility], [address] [place], [place] [address]    Report Status 03/13/2021 FINAL  Final  Culture, body fluid w Gram Stain-bottle     Status: None (Preliminary result)   Collection Time: 03/13/21  4:30 PM   Specimen: Fluid  Result Value Ref Range Status   Specimen Description FLUID PERITONEAL  Final   Special Requests BOTTLES DRAWN AEROBIC AND ANAEROBIC  Final   Culture   Final    NO GROWTH < 24 HOURS Performed at [facility], [address] [place], [place] [address]    Report Status PENDING  Incomplete    Radiology Reports DG Chest 1 View  Result Date: 03/13/2021 CLINICAL DATA:  Status post left thoracentesis. EXAM: CHEST  1 VIEW COMPARISON:  CT 03/12/2021.  Chest x-ray 03/08/2021. FINDINGS: Stable cardiomegaly. Bilateral prominent pulmonary infiltrates, predominantly in the upper lobes, are again noted. Tiny bilateral pleural effusions cannot be excluded. Very tiny questionable left apical pneumothorax noted following  thoracentesis. Postsurgical changes left clavicle. Surgical clips noted over the neck. IMPRESSION: 1. Very questionable tiny left apical pneumothorax post thoracentesis. 2. Persistent prominent bilateral pulmonary infiltrates, predominantly in the upper lobes, again noted. Critical Value/emergent results were called by telephone at the time of interpretation on 03/13/2021 at 2:07 pm to provider [facility] , who verbally acknowledged these results. Electronically Signed   By: Maisie Fus  Register   On: 03/13/2021 14:07   CT ANGIO CHEST PE W OR WO CONTRAST  Result Date: 03/12/2021 CLINICAL DATA:  COVID pneumonia. Elevated D-dimer. Worsening shortness of breath EXAM: CT ANGIOGRAPHY CHEST WITH CONTRAST TECHNIQUE: Multidetector CT imaging of the chest was performed using the standard protocol during bolus administration of intravenous contrast. Multiplanar CT image reconstructions and MIPs were obtained to evaluate the vascular anatomy. CONTRAST:  69mL OMNIPAQUE IOHEXOL 350 MG/ML SOLN COMPARISON:  03/02/2021 FINDINGS: Cardiovascular: No filling defects in the pulmonary arteries to suggest pulmonary emboli. Heart is mildly enlarged. Aorta normal caliber. Scattered calcifications in the aorta. Mediastinum/Nodes: Borderline size mediastinal lymph nodes diffusely, likely reactive. Trachea and esophagus are unremarkable. Thyroid unremarkable. Lungs/Pleura: Extensive bilateral airspace disease, worsening significantly since prior study particularly in the mid and upper lung zones. Large bilateral pleural effusions. Upper Abdomen: Imaging into the upper abdomen demonstrates no acute findings. Musculoskeletal: Chest wall soft tissues are unremarkable. No acute bony abnormality. Review of the MIP images confirms the above findings. IMPRESSION: Extensive bilateral airspace disease, particularly in the mid and upper lung fields most compatible with pneumonia or ARDS. Large bilateral pleural effusions. Cardiomegaly. No evidence of  pulmonary embolus. Electronically Signed   By: Charlett Nose M.D.   On: 03/12/2021 03:00   CT Angio Chest PE W/Cm &/Or Wo Cm  Result Date: 03/02/2021 CLINICAL DATA:  Fever, abdominal pain. EXAM: CT ANGIOGRAPHY CHEST CT ABDOMEN AND PELVIS WITH CONTRAST TECHNIQUE: Multidetector CT imaging of the chest was performed using the standard protocol during bolus administration of intravenous contrast. Multiplanar CT image reconstructions and MIPs were obtained to evaluate the vascular anatomy. Multidetector CT imaging of the abdomen and pelvis was performed using the standard protocol during bolus administration of intravenous contrast. Unfortunately, there is motion artifact during the abdominal scan, limiting these images. CONTRAST:  OMNIPAQUE IOHEXOL 350 MG/ML SOLN COMPARISON:  March 10, 2017. FINDINGS: CTA CHEST FINDINGS Cardiovascular: Satisfactory opacification of the pulmonary arteries to the segmental level. No evidence of pulmonary embolism. Normal heart size. No pericardial effusion. Mediastinum/Nodes: Status post thyroidectomy. The esophagus is unremarkable. No adenopathy is noted. Lungs/Pleura: No pneumothorax or pleural effusion is noted. Right upper lobe airspace opacity is noted concerning for pneumonia. Musculoskeletal: No chest wall abnormality. No acute or significant osseous findings. Review of the MIP images confirms the above findings. CT ABDOMEN and PELVIS FINDINGS Hepatobiliary: No focal liver abnormality is seen. No gallstones, gallbladder wall thickening, or biliary dilatation. Pancreas: Unremarkable. No pancreatic ductal dilatation or surrounding inflammatory changes. Spleen: Normal in size without focal abnormality. Adrenals/Urinary Tract: Adrenal glands appear normal. Visualized portions of kidneys are unremarkable, although the lower poles are not well visualized due to patient motion artifact. No definite hydronephrosis or renal obstruction is noted. No definite renal or ureteral calculi  are noted. Urinary bladder is unremarkable. Stomach/Bowel: Stomach is within normal limits. Appendix appears normal. No evidence of bowel wall thickening, distention, or inflammatory changes. Vascular/Lymphatic: No significant vascular findings are present. No enlarged abdominal or pelvic lymph nodes. Reproductive: Stable mild prostatic enlargement is noted. Other: Small fat containing right inguinal hernia is noted. No ascites is noted. Musculoskeletal: No acute or significant osseous findings. Review of the MIP images confirms the above findings. IMPRESSION: No definite evidence of pulmonary embolus. Probable right upper lobe pneumonia. Limited evaluation of the kidneys is noted due to patient motion artifact. Lower poles are not well visualized and therefore pathology in these areas cannot be excluded. No definite abnormality is seen involving the visualized portions of the kidneys. Small fat containing right inguinal hernia. Stable mild prostatic enlargement. Electronically Signed   By: Lupita Raider  M.D.   On: 03/02/2021 16:49   CT Abdomen Pelvis W Contrast  Result Date: 03/02/2021 CLINICAL DATA:  Fever, abdominal pain. EXAM: CT ANGIOGRAPHY CHEST CT ABDOMEN AND PELVIS WITH CONTRAST TECHNIQUE: Multidetector CT imaging of the chest was performed using the standard protocol during bolus administration of intravenous contrast. Multiplanar CT image reconstructions and MIPs were obtained to evaluate the vascular anatomy. Multidetector CT imaging of the abdomen and pelvis was performed using the standard protocol during bolus administration of intravenous contrast. Unfortunately, there is motion artifact during the abdominal scan, limiting these images. CONTRAST:  OMNIPAQUE IOHEXOL 350 MG/ML SOLN COMPARISON:  March 10, 2017. FINDINGS: CTA CHEST FINDINGS Cardiovascular: Satisfactory opacification of the pulmonary arteries to the segmental level. No evidence of pulmonary embolism. Normal heart size. No  pericardial effusion. Mediastinum/Nodes: Status post thyroidectomy. The esophagus is unremarkable. No adenopathy is noted. Lungs/Pleura: No pneumothorax or pleural effusion is noted. Right upper lobe airspace opacity is noted concerning for pneumonia. Musculoskeletal: No chest wall abnormality. No acute or significant osseous findings. Review of the MIP images confirms the above findings. CT ABDOMEN and PELVIS FINDINGS Hepatobiliary: No focal liver abnormality is seen. No gallstones, gallbladder wall thickening, or biliary dilatation. Pancreas: Unremarkable. No pancreatic ductal dilatation or surrounding inflammatory changes. Spleen: Normal in size without focal abnormality. Adrenals/Urinary Tract: Adrenal glands appear normal. Visualized portions of kidneys are unremarkable, although the lower poles are not well visualized due to patient motion artifact. No definite hydronephrosis or renal obstruction is noted. No definite renal or ureteral calculi are noted. Urinary bladder is unremarkable. Stomach/Bowel: Stomach is within normal limits. Appendix appears normal. No evidence of bowel wall thickening, distention, or inflammatory changes. Vascular/Lymphatic: No significant vascular findings are present. No enlarged abdominal or pelvic lymph nodes. Reproductive: Stable mild prostatic enlargement is noted. Other: Small fat containing right inguinal hernia is noted. No ascites is noted. Musculoskeletal: No acute or significant osseous findings. Review of the MIP images confirms the above findings. IMPRESSION: No definite evidence of pulmonary embolus. Probable right upper lobe pneumonia. Limited evaluation of the kidneys is noted due to patient motion artifact. Lower poles are not well visualized and therefore pathology in these areas cannot be excluded. No definite abnormality is seen involving the visualized portions of the kidneys. Small fat containing right inguinal hernia. Stable mild prostatic enlargement.  Electronically Signed   By: Lupita Raider M.D.   On: 03/02/2021 16:49   DG Chest Port 1 View  Result Date: 03/08/2021 CLINICAL DATA:  Shortness of breath.  Recent COVID-19 positive EXAM: PORTABLE CHEST 1 VIEW COMPARISON:  March 06, 2021 chest radiograph; chest radiograph and chest CT March 02, 2021 FINDINGS: There remains airspace opacity throughout the right upper lobe with volume loss. Airspace opacity throughout much of the left upper lobe as well as in the left lower lung region and to a lesser extent in the right base remain. Heart is borderline enlarged with pulmonary vascular normal. No adenopathy. Postoperative change left clavicle. Evidence of previous thyroidectomy. IMPRESSION: Multifocal airspace opacity, most pronounced in the right upper lobe. There is slight increase in right upper lobe consolidation and volume loss compared to most recent study. Ill-defined airspace opacity elsewhere unchanged. Suspect multifocal pneumonia, potentially of atypical organism etiology. Stable cardiac silhouette. Electronically Signed   By: Bretta Bang III M.D.   On: 03/08/2021 07:56   DG Chest Port 1 View  Result Date: 03/06/2021 CLINICAL DATA:  70 year old male positive COVID-19 on 03/03/2021. Shortness of breath. EXAM: PORTABLE CHEST  1 VIEW COMPARISON:  Portable chest 03/03/2021. FINDINGS: Portable AP semi upright view at 0830 hours. Progressed and extensive airspace opacity now in the right upper lobe, about both hila. Left lung apex and the lateral costophrenic angles remain relatively spared. Stable lung volumes and mediastinal contours. No pneumothorax. Visualized tracheal air column is within normal limits. Prior thyroidectomy and left clavicle ORIF. IMPRESSION: Progressed bilateral pneumonia, now moderate to severe. No pneumothorax or pleural effusion. Electronically Signed   By: Odessa Fleming M.D.   On: 03/06/2021 08:34   DG Chest Port 1 View  Result Date: 03/03/2021 CLINICAL DATA:  Fever. EXAM:  PORTABLE CHEST 1 VIEW COMPARISON:  03/02/2021 FINDINGS: 1535 hours. Patchy airspace disease noted right upper lung and right base. Interstitial markings are diffusely coarsened with chronic features. Bones are diffusely demineralized. Telemetry leads overlie the chest. IMPRESSION: Similar appearance of patchy airspace disease right upper and lower lung compatible with pneumonia. Chronic interstitial changes. Electronically Signed   By: Kennith Center M.D.   On: 03/03/2021 16:03   DG Chest Port 1 View  Result Date: 03/02/2021 CLINICAL DATA:  Shortness of breath EXAM: PORTABLE CHEST 1 VIEW COMPARISON:  July 03, 2020 FINDINGS: There is diffuse interstitial thickening without frank edema or consolidation. Heart size and pulmonary vascularity are normal. No adenopathy. Evidence of prior trauma with postoperative fixation left clavicle. Evidence of old healed fracture anterior right second rib. IMPRESSION: Diffuse interstitial thickening, likely representing a degree of chronic bronchitis. No edema or consolidation. Heart size normal. Electronically Signed   By: Bretta Bang III M.D.   On: 03/02/2021 12:13   ECHOCARDIOGRAM COMPLETE  Result Date: 03/04/2021    ECHOCARDIOGRAM REPORT   Patient Name:   BLAYTON HUTTNER Date of Exam: 03/04/2021 Medical Rec #:  [id number]       Height:       73.0 in Accession #:    [id number]      Weight:       188.1 lb Date of Birth:  [birth year]       BSA:          2.096 m Patient Age:    69 years        BP:           137/77 mmHg Patient Gender: M               HR:           114 bpm. Exam Location:  Inpatient Procedure: 2D Echo, 3D Echo, Cardiac Doppler and Color Doppler Indications:     Bacteremia.  History:         Patient has no prior history of Echocardiogram examinations.                  Abnormal ECG, Arrythmias:Atrial Fibrillation,                  Signs/Symptoms:Bacteremia; Risk Factors:Hypertension and                  Dyslipidemia. Covid infection. Left pneumothorax.   Sonographer:     Sheralyn Boatman RDCS Referring Phys:  [id number] Scheryl Marten CHIU Diagnosing Phys: Laurance Flatten MD  Sonographer Comments: Technically difficult study. IMPRESSIONS  1. There is a mass present on the anterior mitral valve leaflet concerning for a vegetation that measures 1.4x1.0cm. Suspect moderate mitral regurgitation with two separate jets that are posteriorly and anteriorly directed raising concern for possible leaflet perforation.The mean gradient across the MV is (HR 100)  with MVA by continuity of 1.35cm2 suggesting at least mild mitral stenosis in the setting of the mitral valve vegetation as detailed above. Recommend TEE for further evaluation.  2. Left ventricular ejection fraction, by estimation, is 60 to 65%. The left ventricle has normal function. The left ventricle has no regional wall motion abnormalities. There is moderate asymmetric left ventricular hypertrophy of the basal-septal segment. There is mild LVH of the rest of the LV segments. Left ventricular diastolic parameters are consistent with Grade I diastolic dysfunction (impaired relaxation).  3. Right ventricular systolic function is normal. The right ventricular size is normal. There is severely elevated pulmonary artery systolic pressure. The estimated right ventricular systolic pressure is 71.0 mmHg.  4. The aortic valve is tricuspid. There is mild calcification of the aortic valve. There is moderate thickening of the aortic valve. Aortic valve regurgitation is mild. Mild to moderate aortic valve sclerosis/calcification is present, without any evidence of aortic stenosis. No distinct vegetations visualized, but the valve is thickened.  5. The inferior vena cava is normal in size with greater than 50% respiratory variability, suggesting right atrial pressure of 3 mmHg. Comparison(s): No prior Echocardiogram. Conclusion(s)/Recommendation(s): Findings concerning for mitral valve vegetation, would recommend a Transesophageal  Echocardiogram for clarification. FINDINGS  Left Ventricle: Left ventricular ejection fraction, by estimation, is 60 to 65%. The left ventricle has normal function. The left ventricle has no regional wall motion abnormalities. 3D left ventricular ejection fraction analysis performed but not reported based on interpreter judgement due to suboptimal quality. The left ventricular internal cavity size was normal in size. There is moderate asymmetric left ventricular hypertrophy of the basal-septal segment. Left ventricular diastolic parameters are consistent with Grade I diastolic dysfunction (impaired relaxation). There is flow acceleration in the LVOT, however, no systolic anterior motion visualized. Right Ventricle: The right ventricular size is normal. No increase in right ventricular wall thickness. Right ventricular systolic function is normal. There is severely elevated pulmonary artery systolic pressure. The tricuspid regurgitant velocity is 3.74 m/s, and with an assumed right atrial pressure of 15 mmHg, the estimated right ventricular systolic pressure is 71.0 mmHg. Left Atrium: Left atrial size was normal in size. Right Atrium: Right atrial size was normal in size. Pericardium: There is no evidence of pericardial effusion. Mitral Valve: There is a mass present on the anterior mitral valve leaflet concerning for a vegetation that measures 1.4x1.0cm. Suspect moderate mitral regurgitation with two separate jets that are posteriorly and anteriorly directed raising concern for possible leaflet perforation.The mean gradient across the MV is with MVA by continuity of 1.35cm2 suggesting at least mild mitral stenosis in the setting of the mitral valve vegetation as detailed above. Recommend TEE for further evaluation. The mitral valve is abnormal. There is moderate thickening of the mitral valve leaflet(s). There is severe calcification of the mitral valve leaflet(s). Mild mitral annular calcification. Moderate  mitral valve regurgitation. Mild to moderate mitral valve stenosis. MV peak gradient, 22.1 mmHg. The mean mitral valve gradient is 8.0 mmHg. Tricuspid Valve: The tricuspid valve is normal in structure. Tricuspid valve regurgitation is trivial. Aortic Valve: The aortic valve is tricuspid. There is mild calcification of the aortic valve. There is moderate thickening of the aortic valve. Aortic valve regurgitation is mild. Mild to moderate aortic valve sclerosis/calcification is present, without any evidence of aortic stenosis. Pulmonic Valve: The pulmonic valve was normal in structure. Pulmonic valve regurgitation is trivial. Aorta: The aortic root and ascending aorta are structurally normal, with no evidence of dilitation.  Venous: The inferior vena cava is normal in size with greater than 50% respiratory variability, suggesting right atrial pressure of 3 mmHg. IAS/Shunts: No atrial level shunt detected by color flow Doppler.  LEFT VENTRICLE PLAX 2D LVIDd:         4.10 cm      Diastology LVIDs:         2.70 cm      LV e' medial:    9.79 cm/s LV PW:         1.40 cm      LV E/e' medial:  14.5 LV IVS:        1.70 cm      LV e' lateral:   8.92 cm/s LVOT diam:     2.10 cm      LV E/e' lateral: 15.9 LV SV:         65 LV SV Index:   31 LVOT Area:     3.46 cm  LV Volumes (MOD) LV vol d, MOD A2C: 77.3 ml LV vol d, MOD A4C: 104.0 ml LV vol s, MOD A2C: 18.3 ml LV vol s, MOD A4C: 38.5 ml LV SV MOD A2C:     59.0 ml LV SV MOD A4C:     104.0 ml LV SV MOD BP:      72.3 ml RIGHT VENTRICLE             IVC RV S prime:     10.00 cm/s  IVC diam: 2.00 cm TAPSE (M-mode): 1.3 cm LEFT ATRIUM           Index       RIGHT ATRIUM           Index LA diam:      6.00 cm 2.86 cm/m  RA Area:     14.00 cm LA Vol (A2C): 72.1 ml 34.40 ml/m RA Volume:   26.60 ml  12.69 ml/m LA Vol (A4C): 51.1 ml 24.38 ml/m  AORTIC VALVE             PULMONIC VALVE LVOT Vmax:   109.00 cm/s PR End Diast Vel: 1.86 msec LVOT Vmean:  74.500 cm/s LVOT VTI:    0.188 m   AORTA Ao Root diam: 3.70 cm Ao Asc diam:  3.40 cm MITRAL VALVE                 TRICUSPID VALVE MV Area (PHT): 3.21 cm      TR Peak grad:   56.0 mmHg MV Area VTI:   1.48 cm      TR Vmax:        374.00 cm/s MV Peak grad:  22.1 mmHg MV Mean grad:  8.0 mmHg      SHUNTS MV Vmax:       2.35 m/s      Systemic VTI:  0.19 m MV Vmean:      133.0 cm/s    Systemic Diam: 2.10 cm MV Decel Time: 236 msec MR Peak grad:    70.6 mmHg MR Mean grad:    53.0 mmHg MR Vmax:         420.00 cm/s MR Vmean:        351.0 cm/s MR PISA:         4.02 cm MR PISA Eff ROA: 36 mm MR PISA Radius:  0.80 cm MV E velocity: 141.50 cm/s MV A velocity: 157.00 cm/s MV E/A ratio:  0.90 Laurance Flatten MD Electronically signed by Laurance Flatten MD Signature Date/Time: 03/04/2021/12:56:47  PM    Final (Updated)    US EKG SITE RITE  Result Date: 03/12/2021 If Site Rite image not attached, placement could not be confirmed due to current cardiac rhythm.  IR THORACENTESIS ASP PLEURAL SPACE W/IMG GUIDE  Result Date: 03/13/2021 INDICATION: COVID pneumonia. Shortness of breath. Right-sided pleural effusion. Request for diagnostic therapeutic thoracentesis. EXAM: ULTRASOUND GUIDED RIGHT THORACENTESIS MEDICATIONS: 1% plain lidocaine, 8 mL COMPLICATIONS: None immediate. PROCEDURE: An ultrasound guided thoracentesis was thoroughly discussed with the patient and questions answered. The benefits, risks, alternatives and complications were also discussed. The patient understands and wishes to proceed with the procedure. Written consent was obtained. Ultrasound was performed to localize and mark an adequate pocket of fluid in the right chest. The area was then prepped and draped in the normal sterile fashion. 1% Lidocaine was used for local anesthesia. Under ultrasound guidance a 6 Fr Safe-T-Centesis catheter was introduced. Thoracentesis was performed. The catheter was removed and a dressing applied. FINDINGS: A total of approximately 420 mL of slightly hazy  yellow fluid was removed. Samples were sent to the laboratory as requested by the clinical team. IMPRESSION: Successful ultrasound guided right thoracentesis yielding 420 mL of pleural fluid. Read by: Brayton ElKevin Bruning PA-C Electronically Signed   By: Marliss Cootsylan  Suttle MD   On: 03/13/2021 14:08

## 2021-03-14 NOTE — Progress Notes (Signed)
Please see in addition to PT note:   SATURATION QUALIFICATIONS: (This note is used to comply with regulatory documentation for home oxygen)  Patient Saturations on Room Air at Rest = 91%  Patient Saturations on Room Air while Ambulating = 85%  Patient Saturations on 2 Liters of oxygen while Ambulating = 95%  Please briefly explain why patient needs home oxygen:SPO2 desat with activity on room air   Madelaine Etienne, DPT, PN1   Supplemental Physical Therapist [facility]    Pager [phone] Acute Rehab Office [phone]

## 2021-03-14 NOTE — Progress Notes (Signed)
PHARMACY CONSULT NOTE FOR:  OUTPATIENT  PARENTERAL ANTIBIOTIC THERAPY (OPAT)  Indication: bacteremia/endocarditis Regimen: Ampicillin 12 g IV daily as a continuous infusion, ceftriaxone 2 g IV q12h End date: 04/14/21  IV antibiotic discharge orders are pended. To discharging provider:  please sign these orders via discharge navigator,  Select New Orders & click on the button choice - Manage This Unsigned Work.      Thank you for allowing pharmacy to be a part of this patient's care.   Sanda Klein, PharmD, RPh  PGY-1 Pharmacy Resident 03/14/2021 11:20 AM  Please check [email or web address] for unit-specific pharmacy phone numbers.

## 2021-03-14 NOTE — Evaluation (Addendum)
Physical Therapy Evaluation Patient Details Name: Brandon [name] MRN: [id number] DOB: 03-22-1951 Today's Date: 03/14/2021   History of Present Illness  70yo male who presented to the ED on 03/02/21 with possible RUL PNA, declined admission and was discharged from the ED. Blood cultures were then found to be positive for enterococcus and he was asked to return to the hospital, admitted 4/9 and also found to be Covid positive on 4/9. PMH Meneire's disease, HLD, PTX, A-fib, L clavicle fracture s/p ORIF  Clinical Impression   Patient received in bed, very pleasant and cooperative today. Politely declined hallway ambulation but able to perform multiple laps in room during ambulatory sat testing- see accompanying note for details. Mobility and gait all WNL, no significant balance or gait deficits noted. Difficult to get high quality SPO2 pleth, but he did become SOB with activity on room air so do feel he does at least mildly desat with activity without supplemental O2. Left up in recliner with all needs met. Will benefit from ongoing acute PT to address functional activity tolerance, energy management, stair training, and HEP- otherwise no skilled PT f/u anticipated after DC.     Follow Up Recommendations No PT follow up    Equipment Recommendations  None recommended by PT    Recommendations for Other Services       Precautions / Restrictions Precautions Precautions: Other (comment) Precaution Comments: watch sats Restrictions Weight Bearing Restrictions: No      Mobility  Bed Mobility Overal bed mobility: Independent                  Transfers Overall transfer level: Independent Equipment used: None             General transfer comment: occasional assist for line management, no physical assist given with transfers  Ambulation/Gait Ambulation/Gait assistance: Modified independent (Device/Increase time) Gait Distance (Feet): 120 Feet (58fx2, laps in room) Assistive  device: IV Pole Gait Pattern/deviations: WFL(Within Functional Limits);Step-through pattern Gait velocity: WNL   General Gait Details: no significant gait deficits noted, able to side step and walk backwards in room  Stairs            Wheelchair Mobility    Modified Rankin (Stroke Patients Only)       Balance Overall balance assessment: No apparent balance deficits (not formally assessed)                                           Pertinent Vitals/Pain Pain Assessment: No/denies pain    Home Living Family/patient expects to be discharged to:: Private residence Living Arrangements: Other (Comment) (2 grand children ages 259& 250 Available Help at Discharge: Family;Friend(s);Available 24 hours/day Type of Home: House Home Access: Stairs to enter Entrance Stairs-Rails: Right;Left (can't reach both) Entrance Stairs-Number of Steps: 5 Home Layout: Two level;Able to live on main level with bedroom/bathroom Home Equipment: Wheelchair - manual      Prior Function Level of Independence: Independent         Comments: Wife was sick years ago so has wheelchair from that.     Hand Dominance        Extremity/Trunk Assessment   Upper Extremity Assessment Upper Extremity Assessment: Overall WFL for tasks assessed    Lower Extremity Assessment Lower Extremity Assessment: Overall WFL for tasks assessed    Cervical / Trunk Assessment Cervical / Trunk Assessment: Normal  Communication   Communication: No difficulties  Cognition Arousal/Alertness: Awake/alert Behavior During Therapy: WFL for tasks assessed/performed Overall Cognitive Status: Within Functional Limits for tasks assessed                                        General Comments General comments (skin integrity, edema, etc.): see accompanying amb sat note for O2 details    Exercises     Assessment/Plan    PT Assessment Patient needs continued PT services  PT  Problem List Decreased strength;Cardiopulmonary status limiting activity       PT Treatment Interventions DME instruction;Balance training;Gait training;Stair training;Functional mobility training;Patient/family education;Therapeutic activities;Therapeutic exercise    PT Goals (Current goals can be found in the Care Plan section)  Acute Rehab PT Goals Patient Stated Goal: go home without O2 PT Goal Formulation: With patient Time For Goal Achievement: 03/28/21 Potential to Achieve Goals: Good    Frequency Min 3X/week   Barriers to discharge        Co-evaluation               AM-PAC PT "6 Clicks" Mobility  Outcome Measure Help needed turning from your back to your side while in a flat bed without using bedrails?: None Help needed moving from lying on your back to sitting on the side of a flat bed without using bedrails?: None Help needed moving to and from a bed to a chair (including a wheelchair)?: None Help needed standing up from a chair using your arms (e.g., wheelchair or bedside chair)?: None Help needed to walk in hospital room?: None Help needed climbing 3-5 steps with a railing? : A Little 6 Click Score: 23    End of Session Equipment Utilized During Treatment: Oxygen Activity Tolerance: Patient tolerated treatment well Patient left: in chair;with call bell/phone within reach Nurse Communication: Mobility status PT Visit Diagnosis: Muscle weakness (generalized) (M62.81)    Time: 9980-6999 PT Time Calculation (min) (ACUTE ONLY): 23 min   Charges:   PT Evaluation $PT Eval Moderate Complexity: 1 Mod PT Treatments $Gait Training: 8-22 mins        Windell Norfolk, DPT, PN1   Supplemental Physical Therapist [place]    Pager [phone] Acute Rehab Office [phone]

## 2021-03-14 NOTE — Transfer of Care (Signed)
Immediate Anesthesia Transfer of Care Note  Patient: Brandon [name]  Procedure(s) Performed: TRANSESOPHAGEAL ECHOCARDIOGRAM (TEE) (N/A )  Patient Location: Endoscopy Unit  Anesthesia Type:MAC  Level of Consciousness: drowsy, patient cooperative and responds to stimulation  Airway & Oxygen Therapy: Patient Spontanous Breathing and Patient connected to nasal cannula oxygen  Post-op Assessment: Report given to RN, Post -op Vital signs reviewed and stable and Patient moving all extremities  Post vital signs: Reviewed and stable  Last Vitals:  Vitals Value Taken Time  BP 97/65 03/14/21 1358  Temp    Pulse 85 03/14/21 1358  Resp 15 03/14/21 1358  SpO2 92 % 03/14/21 1358  Vitals shown include unvalidated device data.  Last Pain:  Vitals:   03/14/21 1258  TempSrc: Temporal  PainSc: 0-No pain         Complications: No complications documented.

## 2021-03-14 NOTE — Anesthesia Postprocedure Evaluation (Signed)
Anesthesia Post Note  Patient: MIACHEL NARDELLI  Procedure(s) Performed: TRANSESOPHAGEAL ECHOCARDIOGRAM (TEE) (N/A )     Patient location during evaluation: Endoscopy Anesthesia Type: MAC Level of consciousness: awake and alert Pain management: pain level controlled Vital Signs Assessment: post-procedure vital signs reviewed and stable Respiratory status: spontaneous breathing, nonlabored ventilation, respiratory function stable and patient connected to nasal cannula oxygen Cardiovascular status: stable and blood pressure returned to baseline Postop Assessment: no apparent nausea or vomiting Anesthetic complications: no   No complications documented.  Last Vitals:  Vitals:   03/14/21 1400 03/14/21 1410  BP: 97/65 107/64  Pulse: 80 94  Resp: 16 18  Temp: (!) 36.4 C   SpO2: 94% 95%    Last Pain:  Vitals:   03/14/21 1410  TempSrc:   PainSc: 0-No pain                 Heydi Swango,W. EDMOND

## 2021-03-15 ENCOUNTER — Encounter (HOSPITAL_COMMUNITY): Payer: Self-pay | Admitting: Cardiovascular Disease

## 2021-03-15 DIAGNOSIS — U071 COVID-19: Secondary | ICD-10-CM | POA: Diagnosis not present

## 2021-03-15 DIAGNOSIS — R7881 Bacteremia: Secondary | ICD-10-CM | POA: Diagnosis not present

## 2021-03-15 DIAGNOSIS — I330 Acute and subacute infective endocarditis: Secondary | ICD-10-CM | POA: Diagnosis not present

## 2021-03-15 DIAGNOSIS — I059 Rheumatic mitral valve disease, unspecified: Secondary | ICD-10-CM | POA: Diagnosis not present

## 2021-03-15 DIAGNOSIS — I340 Nonrheumatic mitral (valve) insufficiency: Secondary | ICD-10-CM | POA: Diagnosis not present

## 2021-03-15 DIAGNOSIS — J9601 Acute respiratory failure with hypoxia: Secondary | ICD-10-CM | POA: Diagnosis not present

## 2021-03-15 LAB — BASIC METABOLIC PANEL
Anion gap: 5 (ref 5–15)
BUN: 33 mg/dL — ABNORMAL HIGH (ref 8–23)
CO2: 31 mmol/L (ref 22–32)
Calcium: 8.4 mg/dL — ABNORMAL LOW (ref 8.9–10.3)
Chloride: 103 mmol/L (ref 98–111)
Creatinine, Ser: 0.96 mg/dL (ref 0.61–1.24)
GFR, Estimated: >60 mL/min (ref >60)
Glucose, Bld: 100 mg/dL — ABNORMAL HIGH (ref 70–99)
Potassium: 3.8 mmol/L (ref 3.5–5.1)
Sodium: 139 mmol/L (ref 135–145)

## 2021-03-15 LAB — PH, BODY FLUID: pH, Body Fluid: 7.7

## 2021-03-15 LAB — CYTOLOGY - NON PAP

## 2021-03-15 LAB — CBC WITH DIFFERENTIAL/PLATELET
Abs Immature Granulocytes: 0.14 10*3/uL — ABNORMAL HIGH (ref 0.00–0.07)
Basophils Absolute: 0 10*3/uL (ref 0.0–0.1)
Basophils Relative: 0 %
Eosinophils Absolute: 0 10*3/uL (ref 0.0–0.5)
Eosinophils Relative: 0 %
HCT: 34.4 % — ABNORMAL LOW (ref 39.0–52.0)
Hemoglobin: 10.7 g/dL — ABNORMAL LOW (ref 13.0–17.0)
Immature Granulocytes: 1 %
Lymphocytes Relative: 12 %
Lymphs Abs: 1.5 10*3/uL (ref 0.7–4.0)
MCH: 30.3 pg (ref 26.0–34.0)
MCHC: 31.1 g/dL (ref 30.0–36.0)
MCV: 97.5 fL (ref 80.0–100.0)
Monocytes Absolute: 0.7 10*3/uL (ref 0.1–1.0)
Monocytes Relative: 6 %
Neutro Abs: 10.1 10*3/uL — ABNORMAL HIGH (ref 1.7–7.7)
Neutrophils Relative %: 81 %
Platelets: 361 10*3/uL (ref 150–400)
RBC: 3.53 MIL/uL — ABNORMAL LOW (ref 4.22–5.81)
RDW: 14.9 % (ref 11.5–15.5)
WBC: 12.5 10*3/uL — ABNORMAL HIGH (ref 4.0–10.5)
nRBC: 0 % (ref 0.0–0.2)

## 2021-03-15 LAB — FERRITIN: Ferritin: 393 ng/mL — ABNORMAL HIGH (ref 24–336)

## 2021-03-15 LAB — D-DIMER, QUANTITATIVE: D-Dimer, Quant: 1.64 ug/mL-FEU — ABNORMAL HIGH (ref 0.00–0.50)

## 2021-03-15 LAB — MAGNESIUM: Magnesium: 2.2 mg/dL (ref 1.7–2.4)

## 2021-03-15 LAB — C-REACTIVE PROTEIN: CRP: 1 mg/dL — ABNORMAL HIGH (ref <1.0)

## 2021-03-15 LAB — PHOSPHORUS: Phosphorus: 2.7 mg/dL (ref 2.5–4.6)

## 2021-03-15 LAB — LACTATE DEHYDROGENASE: LDH: 147 U/L (ref 98–192)

## 2021-03-15 MED ORDER — PANTOPRAZOLE SODIUM 40 MG PO TBEC: 40.0000 mg | DELAYED_RELEASE_TABLET | Freq: Every day | ORAL | Status: DC

## 2021-03-15 MED ORDER — SODIUM CHLORIDE 0.9 % IV SOLN: 2.0000 g | Freq: Two times a day (BID) | INTRAVENOUS | Status: DC

## 2021-03-15 MED ORDER — ADULT MULTIVITAMIN W/MINERALS CH: 1.0000 | ORAL_TABLET | Freq: Every day | ORAL | Status: DC

## 2021-03-15 MED ORDER — SODIUM CHLORIDE 0.9 % IV SOLN: 2.0000 g | INTRAVENOUS | Status: DC

## 2021-03-15 MED ORDER — ENSURE ENLIVE PO LIQD: 237.0000 mL | Freq: Two times a day (BID) | ORAL | 12 refills | Status: DC

## 2021-03-15 MED ORDER — SODIUM CHLORIDE 0.9% FLUSH
3.0000 mL | Freq: Two times a day (BID) | INTRAVENOUS | Status: DC
  Administered 2021-03-16: 3 mL via INTRAVENOUS

## 2021-03-15 MED ORDER — PREDNISONE 20 MG PO TABS
30.0000 mg | ORAL_TABLET | Freq: Every day | ORAL | Status: DC
  Filled 2021-03-15: qty 2

## 2021-03-15 MED ORDER — ENOXAPARIN SODIUM 40 MG/0.4ML ~~LOC~~ SOLN: 40.0000 mg | SUBCUTANEOUS | Status: DC

## 2021-03-15 MED ORDER — PREDNISONE 10 MG PO TABS: 30.0000 mg | ORAL_TABLET | Freq: Every day | ORAL | Status: DC

## 2021-03-15 MED ORDER — FUROSEMIDE 40 MG PO TABS: 40.0000 mg | ORAL_TABLET | Freq: Every day | ORAL | Status: DC

## 2021-03-15 NOTE — Care Management Important Message (Signed)
Important Message  Patient Details  Name: XIONG HAIDAR MRN: [id number] Date of Birth: [birth year]   Medicare Important Message Given:  Yes - Important Message mailed due to current National Emergency   Verbal consent obtained due to current National Emergency  Relationship to patient: Self Contact Name: Tom Call Date: 03/15/21  Time: 1444 Phone: [phone] Outcome: No Answer/Busy Important Message mailed to: Patient address on file    Orson Aloe 03/15/2021, 2:44 PM

## 2021-03-15 NOTE — Discharge Summary (Addendum)
PATIENT DETAILS Name: Brandon [name] Age: 70 y.o. Sex: male Date of Birth: 1951/05/18 MRN: [id number]. Admitting Physician: Jerald Kief, MD WUJ:[id number], Resa Miner, FNP  Admit Date: 03/03/2021 Discharge date: 03/15/2021  Recommendations for Outpatient Follow-up:  1. Follow up with PCP in 1-2 weeks once discharged from [facility].  Admitted From:  Home  Disposition: [facility]   Home Health: No  Equipment/Devices: None  Discharge Condition: Stable  CODE STATUS: FULL CODE  Diet recommendation:  Diet Order            Diet - low sodium heart healthy           Diet regular Room service appropriate? Yes; Fluid consistency: Thin  Diet effective now                  Brief Narrative: Patient is a 70 y.o. male with PMHx of COVID infection in October 2021-presented to the ED on 4/8 with fever, cough, shortness of breath-was found to have pneumonia-he refused admission and was discharged home on Augmentin and Zithromax-he returned later to the ED as his blood cultures were positive for Enterococcus.  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  Patient was managed with IV antibiotics-was seen in consultation by ID-TEE was performed on 6/20-which showed flail anterior leaflet of the mitral valve with perforation along with severe MR.  Cardiothoracic surgery felt that patient was a very high risk candidate for valve replacement given significant pulmonary infiltrates on imaging.  A second opinion with [facility] was sought-patient has been accepted as a transfer to [facility] for further management of  COVID-19 vaccinated status: Vaccinated  Significant Events: 4/9>> Admit to [facility] for pneumonia/enterococcal bacteremia/COVID-19 infection  Significant studies: 4/8>> CTA chest: No PE, right upper lobe pneumonia. 4/8>> CT abdomen: No acute abnormalities 4/10>> Echo: Mitral valve vegetation-moderate MR, EF 60-65% 4/18>> CTA chest:  Extensive pneumonia, large bilateral pleural effusions.  No PE.  COVID-19 medications: Steroids: 4/12>> Remdesivir: 4/10>> 4/14  Antibiotics: Ampicillin: 4/9>> Rocephin: 4/9>>  Microbiology data: 4/8>> blood culture: Enterococcus faecalis 4/9>> blood culture: No growth 4/9>> urine culture: Negative 4/19>> pleural fluid culture: No growth  Procedures: 4/19>> right thoracocentesis by IR 4/19>> right PICC line 4/22>> LHC/RHC: Mild nonobstructive CAD, EF 60%, severe V waves    Consults: ID, cardiothoracic surgery, cardiology, IR  Brief Hospital Course: Sepsis due to enterococcal bacteremia with mitral valve endocarditis: Sepsis physiology has resolved-cultures/antimicrobial therapy as above.  TEE with severe MR-flail A2 segment with perforation-cardiology has discussed with [facility]-has been accepted for transfer-however on a waiting list-and will require RHC/LHC preoperatively which is tentatively scheduled for tomorrow if patient remains at [facility].  Acute hypoxic respiratory failure due to multifocal pneumonia from Enterococcus and COVID-19: Improving-only on 2 L of oxygen-continue to taper down steroids rapidly.  No longer on isolation.    Bilateral pleural effusions: Transudative by lights criteria.  Suspect this could be from hypoalbuminemia and probably diastolic CHF in the setting of mitral regurgitation.  Continue oral furosemide.  Hypokalemia: Repleted.  Normocytic anemia: Due to acute illness-monitor CBC periodically.  No evidence of blood loss.  Hypothyroidism: Continue levothyroxine  Nutrition Problem: Nutrition Problem: Increased nutrient needs Etiology: acute illness Signs/Symptoms: estimated needs Interventions: Ensure Enlive (each supplement provides 350kcal and 20 grams of protein),MVI   Discharge Diagnoses:  Principal Problem:   Endocarditis of mitral valve Active Problems:   Sepsis (HCC)   Bacteremia   COVID-19 virus infection   Osler node  Acute respiratory failure with hypoxia (HCC)   Pneumonia due to COVID-19 virus   CAP (community acquired pneumonia)   Elevated liver enzymes   Discharge Instructions:  Activity:  As tolerated   Discharge Instructions    Diet - low sodium heart healthy   Complete by: As directed    Increase activity slowly   Complete by: As directed      Allergies as of 03/15/2021      Reactions   Ceftin [cefuroxime Axetil] Diarrhea   Oxycodone Nausea And Vomiting   Iohexol    03/02/21 pt received prednisone and benadryl 1hr before CT Abd with contrast, tolerated w/o issue 03/10/17 Patient started sneezing during the injection and continued sneezing after exam. ER doc notified and patient was given benadryl and cortisone      Medication List    STOP taking these medications   acetaminophen 500 MG tablet Commonly known as: TYLENOL   amoxicillin-clavulanate 875-125 MG tablet Commonly known as: AUGMENTIN   azithromycin 250 MG tablet Commonly known as: Zithromax Z-Pak   diphenhydrAMINE 50 MG capsule Commonly known as: BENADRYL   PROBIOTIC-10 PO   testosterone cypionate 200 MG/ML injection Commonly known as: DEPOTESTOSTERONE CYPIONATE   triamterene-hydrochlorothiazide 37.5-25 MG capsule Commonly known as: DYAZIDE     TAKE these medications   ampicillin 2 g in sodium chloride 0.9 % 100 mL Inject 2 g into the vein every 4 (four) hours.   cefTRIAXone 2 g in sodium chloride 0.9 % 100 mL Inject 2 g into the vein every 12 (twelve) hours.   cholecalciferol 1000 units tablet Commonly known as: VITAMIN D Take 1,000 Units by mouth daily.   enoxaparin 40 MG/0.4ML injection Commonly known as: LOVENOX Inject 0.4 mLs (40 mg total) into the skin daily. Start taking on: March 16, 2021   feeding supplement Liqd Take 237 mLs by mouth 2 (two) times daily between meals. Start taking on: March 16, 2021   fluticasone 50 MCG/ACT nasal spray Commonly known as: FLONASE Place 1 spray into both  nostrils daily as needed for allergies.   furosemide 40 MG tablet Commonly known as: LASIX Take 1 tablet (40 mg total) by mouth daily. Start taking on: March 16, 2021   levothyroxine 175 MCG tablet Commonly known as: SYNTHROID Take 175 mcg by mouth daily.   multivitamin with minerals Tabs tablet Take 1 tablet by mouth daily. Start taking on: March 16, 2021   pantoprazole 40 MG tablet Commonly known as: PROTONIX Take 1 tablet (40 mg total) by mouth daily. Start taking on: March 16, 2021   predniSONE 10 MG tablet Commonly known as: DELTASONE Take 3 tablets (30 mg total) by mouth daily with breakfast. Start taking on: March 16, 2021 What changed:   medication strength  how much to take  how to take this  when to take this  additional instructions  Another medication with the same name was removed. Continue taking this medication, and follow the directions you see here.       Allergies  Allergen Reactions  . Ceftin [Cefuroxime Axetil] Diarrhea  . Oxycodone Nausea And Vomiting  . Iohexol     03/02/21 pt received prednisone and benadryl 1hr before CT Abd with contrast, tolerated w/o issue 03/10/17 Patient started sneezing during the injection and continued sneezing after exam. ER doc notified and patient was given benadryl and cortisone     Other Procedures/Studies: DG Chest 1 View  Result Date: 03/13/2021 CLINICAL DATA:  Status post left thoracentesis. EXAM: CHEST  1 VIEW COMPARISON:  CT 03/12/2021.  Chest x-ray 03/08/2021. FINDINGS: Stable cardiomegaly. Bilateral prominent pulmonary infiltrates, predominantly in the upper lobes, are again noted. Tiny bilateral pleural effusions cannot be excluded. Very tiny questionable left apical pneumothorax noted following thoracentesis. Postsurgical changes left clavicle. Surgical clips noted over the neck. IMPRESSION: 1. Very questionable tiny left apical pneumothorax post thoracentesis. 2. Persistent prominent bilateral pulmonary  infiltrates, predominantly in the upper lobes, again noted. Critical Value/emergent results were called by telephone at the time of interpretation on 03/13/2021 at 2:07 pm to provider [facility] , who verbally acknowledged these results. Electronically Signed   By: Maisie Fus  Register   On: 03/13/2021 14:07   CT ANGIO CHEST PE W OR WO CONTRAST  Result Date: 03/12/2021 CLINICAL DATA:  COVID pneumonia. Elevated D-dimer. Worsening shortness of breath EXAM: CT ANGIOGRAPHY CHEST WITH CONTRAST TECHNIQUE: Multidetector CT imaging of the chest was performed using the standard protocol during bolus administration of intravenous contrast. Multiplanar CT image reconstructions and MIPs were obtained to evaluate the vascular anatomy. CONTRAST:  69mL OMNIPAQUE IOHEXOL 350 MG/ML SOLN COMPARISON:  03/02/2021 FINDINGS: Cardiovascular: No filling defects in the pulmonary arteries to suggest pulmonary emboli. Heart is mildly enlarged. Aorta normal caliber. Scattered calcifications in the aorta. Mediastinum/Nodes: Borderline size mediastinal lymph nodes diffusely, likely reactive. Trachea and esophagus are unremarkable. Thyroid unremarkable. Lungs/Pleura: Extensive bilateral airspace disease, worsening significantly since prior study particularly in the mid and upper lung zones. Large bilateral pleural effusions. Upper Abdomen: Imaging into the upper abdomen demonstrates no acute findings. Musculoskeletal: Chest wall soft tissues are unremarkable. No acute bony abnormality. Review of the MIP images confirms the above findings. IMPRESSION: Extensive bilateral airspace disease, particularly in the mid and upper lung fields most compatible with pneumonia or ARDS. Large bilateral pleural effusions. Cardiomegaly. No evidence of pulmonary embolus. Electronically Signed   By: Charlett Nose M.D.   On: 03/12/2021 03:00   CT Angio Chest PE W/Cm &/Or Wo Cm  Result Date: 03/02/2021 CLINICAL DATA:  Fever, abdominal pain. EXAM: CT ANGIOGRAPHY  CHEST CT ABDOMEN AND PELVIS WITH CONTRAST TECHNIQUE: Multidetector CT imaging of the chest was performed using the standard protocol during bolus administration of intravenous contrast. Multiplanar CT image reconstructions and MIPs were obtained to evaluate the vascular anatomy. Multidetector CT imaging of the abdomen and pelvis was performed using the standard protocol during bolus administration of intravenous contrast. Unfortunately, there is motion artifact during the abdominal scan, limiting these images. CONTRAST:  OMNIPAQUE IOHEXOL 350 MG/ML SOLN COMPARISON:  March 10, 2017. FINDINGS: CTA CHEST FINDINGS Cardiovascular: Satisfactory opacification of the pulmonary arteries to the segmental level. No evidence of pulmonary embolism. Normal heart size. No pericardial effusion. Mediastinum/Nodes: Status post thyroidectomy. The esophagus is unremarkable. No adenopathy is noted. Lungs/Pleura: No pneumothorax or pleural effusion is noted. Right upper lobe airspace opacity is noted concerning for pneumonia. Musculoskeletal: No chest wall abnormality. No acute or significant osseous findings. Review of the MIP images confirms the above findings. CT ABDOMEN and PELVIS FINDINGS Hepatobiliary: No focal liver abnormality is seen. No gallstones, gallbladder wall thickening, or biliary dilatation. Pancreas: Unremarkable. No pancreatic ductal dilatation or surrounding inflammatory changes. Spleen: Normal in size without focal abnormality. Adrenals/Urinary Tract: Adrenal glands appear normal. Visualized portions of kidneys are unremarkable, although the lower poles are not well visualized due to patient motion artifact. No definite hydronephrosis or renal obstruction is noted. No definite renal or ureteral calculi are noted. Urinary bladder is unremarkable. Stomach/Bowel: Stomach is within normal limits. Appendix appears  normal. No evidence of bowel wall thickening, distention, or inflammatory changes. Vascular/Lymphatic:  No significant vascular findings are present. No enlarged abdominal or pelvic lymph nodes. Reproductive: Stable mild prostatic enlargement is noted. Other: Small fat containing right inguinal hernia is noted. No ascites is noted. Musculoskeletal: No acute or significant osseous findings. Review of the MIP images confirms the above findings. IMPRESSION: No definite evidence of pulmonary embolus. Probable right upper lobe pneumonia. Limited evaluation of the kidneys is noted due to patient motion artifact. Lower poles are not well visualized and therefore pathology in these areas cannot be excluded. No definite abnormality is seen involving the visualized portions of the kidneys. Small fat containing right inguinal hernia. Stable mild prostatic enlargement. Electronically Signed   By: Lupita Raider M.D.   On: 03/02/2021 16:49   CT Abdomen Pelvis W Contrast  Result Date: 03/02/2021 CLINICAL DATA:  Fever, abdominal pain. EXAM: CT ANGIOGRAPHY CHEST CT ABDOMEN AND PELVIS WITH CONTRAST TECHNIQUE: Multidetector CT imaging of the chest was performed using the standard protocol during bolus administration of intravenous contrast. Multiplanar CT image reconstructions and MIPs were obtained to evaluate the vascular anatomy. Multidetector CT imaging of the abdomen and pelvis was performed using the standard protocol during bolus administration of intravenous contrast. Unfortunately, there is motion artifact during the abdominal scan, limiting these images. CONTRAST:  OMNIPAQUE IOHEXOL 350 MG/ML SOLN COMPARISON:  March 10, 2017. FINDINGS: CTA CHEST FINDINGS Cardiovascular: Satisfactory opacification of the pulmonary arteries to the segmental level. No evidence of pulmonary embolism. Normal heart size. No pericardial effusion. Mediastinum/Nodes: Status post thyroidectomy. The esophagus is unremarkable. No adenopathy is noted. Lungs/Pleura: No pneumothorax or pleural effusion is noted. Right upper lobe airspace opacity  is noted concerning for pneumonia. Musculoskeletal: No chest wall abnormality. No acute or significant osseous findings. Review of the MIP images confirms the above findings. CT ABDOMEN and PELVIS FINDINGS Hepatobiliary: No focal liver abnormality is seen. No gallstones, gallbladder wall thickening, or biliary dilatation. Pancreas: Unremarkable. No pancreatic ductal dilatation or surrounding inflammatory changes. Spleen: Normal in size without focal abnormality. Adrenals/Urinary Tract: Adrenal glands appear normal. Visualized portions of kidneys are unremarkable, although the lower poles are not well visualized due to patient motion artifact. No definite hydronephrosis or renal obstruction is noted. No definite renal or ureteral calculi are noted. Urinary bladder is unremarkable. Stomach/Bowel: Stomach is within normal limits. Appendix appears normal. No evidence of bowel wall thickening, distention, or inflammatory changes. Vascular/Lymphatic: No significant vascular findings are present. No enlarged abdominal or pelvic lymph nodes. Reproductive: Stable mild prostatic enlargement is noted. Other: Small fat containing right inguinal hernia is noted. No ascites is noted. Musculoskeletal: No acute or significant osseous findings. Review of the MIP images confirms the above findings. IMPRESSION: No definite evidence of pulmonary embolus. Probable right upper lobe pneumonia. Limited evaluation of the kidneys is noted due to patient motion artifact. Lower poles are not well visualized and therefore pathology in these areas cannot be excluded. No definite abnormality is seen involving the visualized portions of the kidneys. Small fat containing right inguinal hernia. Stable mild prostatic enlargement. Electronically Signed   By: Lupita Raider M.D.   On: 03/02/2021 16:49   DG Chest Port 1 View  Result Date: 03/08/2021 CLINICAL DATA:  Shortness of breath.  Recent COVID-19 positive EXAM: PORTABLE CHEST 1 VIEW  COMPARISON:  March 06, 2021 chest radiograph; chest radiograph and chest CT March 02, 2021 FINDINGS: There remains airspace opacity throughout the right upper lobe with volume  loss. Airspace opacity throughout much of the left upper lobe as well as in the left lower lung region and to a lesser extent in the right base remain. Heart is borderline enlarged with pulmonary vascular normal. No adenopathy. Postoperative change left clavicle. Evidence of previous thyroidectomy. IMPRESSION: Multifocal airspace opacity, most pronounced in the right upper lobe. There is slight increase in right upper lobe consolidation and volume loss compared to most recent study. Ill-defined airspace opacity elsewhere unchanged. Suspect multifocal pneumonia, potentially of atypical organism etiology. Stable cardiac silhouette. Electronically Signed   By: Bretta Bang III M.D.   On: 03/08/2021 07:56   DG Chest Port 1 View  Result Date: 03/06/2021 CLINICAL DATA:  70 year old male positive COVID-19 on 03/03/2021. Shortness of breath. EXAM: PORTABLE CHEST 1 VIEW COMPARISON:  Portable chest 03/03/2021. FINDINGS: Portable AP semi upright view at 0830 hours. Progressed and extensive airspace opacity now in the right upper lobe, about both hila. Left lung apex and the lateral costophrenic angles remain relatively spared. Stable lung volumes and mediastinal contours. No pneumothorax. Visualized tracheal air column is within normal limits. Prior thyroidectomy and left clavicle ORIF. IMPRESSION: Progressed bilateral pneumonia, now moderate to severe. No pneumothorax or pleural effusion. Electronically Signed   By: Odessa Fleming M.D.   On: 03/06/2021 08:34   DG Chest Port 1 View  Result Date: 03/03/2021 CLINICAL DATA:  Fever. EXAM: PORTABLE CHEST 1 VIEW COMPARISON:  03/02/2021 FINDINGS: 1535 hours. Patchy airspace disease noted right upper lung and right base. Interstitial markings are diffusely coarsened with chronic features. Bones are diffusely  demineralized. Telemetry leads overlie the chest. IMPRESSION: Similar appearance of patchy airspace disease right upper and lower lung compatible with pneumonia. Chronic interstitial changes. Electronically Signed   By: Kennith Center M.D.   On: 03/03/2021 16:03   DG Chest Port 1 View  Result Date: 03/02/2021 CLINICAL DATA:  Shortness of breath EXAM: PORTABLE CHEST 1 VIEW COMPARISON:  July 03, 2020 FINDINGS: There is diffuse interstitial thickening without frank edema or consolidation. Heart size and pulmonary vascularity are normal. No adenopathy. Evidence of prior trauma with postoperative fixation left clavicle. Evidence of old healed fracture anterior right second rib. IMPRESSION: Diffuse interstitial thickening, likely representing a degree of chronic bronchitis. No edema or consolidation. Heart size normal. Electronically Signed   By: Bretta Bang III M.D.   On: 03/02/2021 12:13   ECHOCARDIOGRAM COMPLETE  Result Date: 03/04/2021    ECHOCARDIOGRAM REPORT   Patient Name:   Brandon [name] Date of Exam: 03/04/2021 Medical Rec #:  [id number]       Height:       73.0 in Accession #:    [id number]      Weight:       188.1 lb Date of Birth:  10/23/1951       BSA:          2.096 m Patient Age:    69 years        BP:           137/77 mmHg Patient Gender: M               HR:           114 bpm. Exam Location:  Inpatient Procedure: 2D Echo, 3D Echo, Cardiac Doppler and Color Doppler Indications:     Bacteremia.  History:         Patient has no prior history of Echocardiogram examinations.  Abnormal ECG, Arrythmias:Atrial Fibrillation,                  Signs/Symptoms:Bacteremia; Risk Factors:Hypertension and                  Dyslipidemia. Covid infection. Left pneumothorax.  Sonographer:     Sheralyn Boatman RDCS Referring Phys:  [id number] Scheryl Marten CHIU Diagnosing Phys: Laurance Flatten MD  Sonographer Comments: Technically difficult study. IMPRESSIONS  1. There is a mass present on the anterior mitral  valve leaflet concerning for a vegetation that measures 1.4x1.0cm. Suspect moderate mitral regurgitation with two separate jets that are posteriorly and anteriorly directed raising concern for possible leaflet perforation.The mean gradient across the MV is (HR 100) with MVA by continuity of 1.35cm2 suggesting at least mild mitral stenosis in the setting of the mitral valve vegetation as detailed above. Recommend TEE for further evaluation.  2. Left ventricular ejection fraction, by estimation, is 60 to 65%. The left ventricle has normal function. The left ventricle has no regional wall motion abnormalities. There is moderate asymmetric left ventricular hypertrophy of the basal-septal segment. There is mild LVH of the rest of the LV segments. Left ventricular diastolic parameters are consistent with Grade I diastolic dysfunction (impaired relaxation).  3. Right ventricular systolic function is normal. The right ventricular size is normal. There is severely elevated pulmonary artery systolic pressure. The estimated right ventricular systolic pressure is 71.0 mmHg.  4. The aortic valve is tricuspid. There is mild calcification of the aortic valve. There is moderate thickening of the aortic valve. Aortic valve regurgitation is mild. Mild to moderate aortic valve sclerosis/calcification is present, without any evidence of aortic stenosis. No distinct vegetations visualized, but the valve is thickened.  5. The inferior vena cava is normal in size with greater than 50% respiratory variability, suggesting right atrial pressure of 3 mmHg. Comparison(s): No prior Echocardiogram. Conclusion(s)/Recommendation(s): Findings concerning for mitral valve vegetation, would recommend a Transesophageal Echocardiogram for clarification. FINDINGS  Left Ventricle: Left ventricular ejection fraction, by estimation, is 60 to 65%. The left ventricle has normal function. The left ventricle has no regional wall motion abnormalities. 3D  left ventricular ejection fraction analysis performed but not reported based on interpreter judgement due to suboptimal quality. The left ventricular internal cavity size was normal in size. There is moderate asymmetric left ventricular hypertrophy of the basal-septal segment. Left ventricular diastolic parameters are consistent with Grade I diastolic dysfunction (impaired relaxation). There is flow acceleration in the LVOT, however, no systolic anterior motion visualized. Right Ventricle: The right ventricular size is normal. No increase in right ventricular wall thickness. Right ventricular systolic function is normal. There is severely elevated pulmonary artery systolic pressure. The tricuspid regurgitant velocity is 3.74 m/s, and with an assumed right atrial pressure of 15 mmHg, the estimated right ventricular systolic pressure is 71.0 mmHg. Left Atrium: Left atrial size was normal in size. Right Atrium: Right atrial size was normal in size. Pericardium: There is no evidence of pericardial effusion. Mitral Valve: There is a mass present on the anterior mitral valve leaflet concerning for a vegetation that measures 1.4x1.0cm. Suspect moderate mitral regurgitation with two separate jets that are posteriorly and anteriorly directed raising concern for possible leaflet perforation.The mean gradient across the MV is with MVA by continuity of 1.35cm2 suggesting at least mild mitral stenosis in the setting of the mitral valve vegetation as detailed above. Recommend TEE for further evaluation. The mitral valve is abnormal. There is moderate thickening of the  mitral valve leaflet(s). There is severe calcification of the mitral valve leaflet(s). Mild mitral annular calcification. Moderate mitral valve regurgitation. Mild to moderate mitral valve stenosis. MV peak gradient, 22.1 mmHg. The mean mitral valve gradient is 8.0 mmHg. Tricuspid Valve: The tricuspid valve is normal in structure. Tricuspid valve regurgitation  is trivial. Aortic Valve: The aortic valve is tricuspid. There is mild calcification of the aortic valve. There is moderate thickening of the aortic valve. Aortic valve regurgitation is mild. Mild to moderate aortic valve sclerosis/calcification is present, without any evidence of aortic stenosis. Pulmonic Valve: The pulmonic valve was normal in structure. Pulmonic valve regurgitation is trivial. Aorta: The aortic root and ascending aorta are structurally normal, with no evidence of dilitation. Venous: The inferior vena cava is normal in size with greater than 50% respiratory variability, suggesting right atrial pressure of 3 mmHg. IAS/Shunts: No atrial level shunt detected by color flow Doppler.  LEFT VENTRICLE PLAX 2D LVIDd:         4.10 cm      Diastology LVIDs:         2.70 cm      LV e' medial:    9.79 cm/s LV PW:         1.40 cm      LV E/e' medial:  14.5 LV IVS:        1.70 cm      LV e' lateral:   8.92 cm/s LVOT diam:     2.10 cm      LV E/e' lateral: 15.9 LV SV:         65 LV SV Index:   31 LVOT Area:     3.46 cm  LV Volumes (MOD) LV vol d, MOD A2C: 77.3 ml LV vol d, MOD A4C: 104.0 ml LV vol s, MOD A2C: 18.3 ml LV vol s, MOD A4C: 38.5 ml LV SV MOD A2C:     59.0 ml LV SV MOD A4C:     104.0 ml LV SV MOD BP:      72.3 ml RIGHT VENTRICLE             IVC RV S prime:     10.00 cm/s  IVC diam: 2.00 cm TAPSE (M-mode): 1.3 cm LEFT ATRIUM           Index       RIGHT ATRIUM           Index LA diam:      6.00 cm 2.86 cm/m  RA Area:     14.00 cm LA Vol (A2C): 72.1 ml 34.40 ml/m RA Volume:   26.60 ml  12.69 ml/m LA Vol (A4C): 51.1 ml 24.38 ml/m  AORTIC VALVE             PULMONIC VALVE LVOT Vmax:   109.00 cm/s PR End Diast Vel: 1.86 msec LVOT Vmean:  74.500 cm/s LVOT VTI:    0.188 m  AORTA Ao Root diam: 3.70 cm Ao Asc diam:  3.40 cm MITRAL VALVE                 TRICUSPID VALVE MV Area (PHT): 3.21 cm      TR Peak grad:   56.0 mmHg MV Area VTI:   1.48 cm      TR Vmax:        374.00 cm/s MV Peak grad:  22.1 mmHg MV  Mean grad:  8.0 mmHg      SHUNTS MV Vmax:  2.35 m/s      Systemic VTI:  0.19 m MV Vmean:      133.0 cm/s    Systemic Diam: 2.10 cm MV Decel Time: 236 msec MR Peak grad:    70.6 mmHg MR Mean grad:    53.0 mmHg MR Vmax:         420.00 cm/s MR Vmean:        351.0 cm/s MR PISA:         4.02 cm MR PISA Eff ROA: 36 mm MR PISA Radius:  0.80 cm MV E velocity: 141.50 cm/s MV A velocity: 157.00 cm/s MV E/A ratio:  0.90 Laurance Flatten MD Electronically signed by Laurance Flatten MD Signature Date/Time: 03/04/2021/12:56:47 PM    Final (Updated)    ECHO TEE  Result Date: 03/14/2021    TRANSESOPHOGEAL ECHO REPORT   Patient Name:   Brandon [name] Date of Exam: 03/14/2021 Medical Rec #:  [id number]       Height:       73.0 in Accession #:    [id number]      Weight:       188.1 lb Date of Birth:  15-May-1951       BSA:          2.096 m Patient Age:    69 years        BP:           114/73 mmHg Patient Gender: M               HR:           92 bpm. Exam Location:  Inpatient Procedure: Transesophageal Echo, 3D Echo, Cardiac Doppler and Color Doppler Indications:     Endocarditis  History:         Patient has prior history of Echocardiogram examinations, most                  recent 03/04/2021. Arrythmias:Atrial Fibrillation; Risk                  Factors:Dyslipidemia.  Sonographer:     Eulah Pont RDCS Referring Phys:  [id number] Wendall Stade Diagnosing Phys: Charlton Haws MD PROCEDURE: After discussion of the risks and benefits of a TEE, an informed consent was obtained from the patient. The transesophogeal probe was passed without difficulty through the esophogus of the patient. Sedation performed by different physician. The patient was monitored while under deep sedation. Anesthestetic sedation was provided intravenously by Anesthesiology: 228.99mg  of Propofol. The patient's vital signs; including heart rate, blood pressure, and oxygen saturation; remained stable throughout the procedure. The patient developed no  complications during the procedure. IMPRESSIONS  1. Left ventricular ejection fraction, by estimation, is 55 to 60%. The left ventricle has normal function.  2. Right ventricular systolic function is normal. The right ventricular size is normal.  3. Left atrial size was mildly dilated. No left atrial/left atrial appendage thrombus was detected.  4. Flail A2 segment with perforation at the base of A2 and large vegetation 2.2 cm on ventricular side extending into papillary muscle. Intervalvular fibrosa intact Also appears to be small vegetation on P2. Findings discussed with CVTS Dr Dorris Fetch     Extensive 3D imaging of MV was performed . The mitral valve is abnormal. 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  5. The aortic valve is tricuspid. There is mild calcification of the aortic valve. Aortic valve regurgitation is trivial. Mild aortic valve sclerosis is present, with no evidence of aortic  valve stenosis. FINDINGS  Left Ventricle: Left ventricular ejection fraction, by estimation, is 55 to 60%. The left ventricle has normal function. The left ventricular internal cavity size was normal in size. There is no left ventricular hypertrophy. Right Ventricle: The right ventricular size is normal. No increase in right ventricular wall thickness. Right ventricular systolic function is normal. Left Atrium: Left atrial size was mildly dilated. No left atrial/left atrial appendage thrombus was detected. Right Atrium: Right atrial size was normal in size. Pericardium: There is no evidence of pericardial effusion. 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 Intervalvular fibrosa intact Also appears to be small vegetation on P2. Findings discussed with CVTS Dr Dorris Fetch Extensive 3D imaging of MV was performed. The mitral valve is abnormal. 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 Tricuspid Valve: The tricuspid valve is  normal in structure. Tricuspid valve regurgitation is mild. Aortic Valve: The aortic valve is tricuspid. There is mild calcification of the aortic valve. Aortic valve regurgitation is trivial. Mild aortic valve sclerosis is present, with no evidence of aortic valve stenosis. Pulmonic Valve: The pulmonic valve was normal in structure. Pulmonic valve regurgitation is mild. Aorta: The aortic root is normal in size and structure. IAS/Shunts: No atrial level shunt detected by color flow Doppler.  MR PISA:        16.08 cm MR PISA Radius: 1.60 cm Charlton Haws MD Electronically signed by Charlton Haws MD Signature Date/Time: 03/14/2021/2:36:40 PM    Final    [place] EKG SITE RITE  Result Date: 03/12/2021 If Site Rite image not attached, placement could not be confirmed due to current cardiac rhythm.  IR THORACENTESIS ASP PLEURAL SPACE W/IMG GUIDE  Result Date: 03/13/2021 INDICATION: COVID pneumonia. Shortness of breath. Right-sided pleural effusion. Request for diagnostic therapeutic thoracentesis. EXAM: ULTRASOUND GUIDED RIGHT THORACENTESIS MEDICATIONS: 1% plain lidocaine, 8 mL COMPLICATIONS: None immediate. PROCEDURE: An ultrasound guided thoracentesis was thoroughly discussed with the patient and questions answered. The benefits, risks, alternatives and complications were also discussed. The patient understands and wishes to proceed with the procedure. Written consent was obtained. Ultrasound was performed to localize and mark an adequate pocket of fluid in the right chest. The area was then prepped and draped in the normal sterile fashion. 1% Lidocaine was used for local anesthesia. Under ultrasound guidance a 6 Fr Safe-T-Centesis catheter was introduced. Thoracentesis was performed. The catheter was removed and a dressing applied. FINDINGS: A total of approximately 420 mL of slightly hazy yellow fluid was removed. Samples were sent to the laboratory as requested by the clinical team. IMPRESSION: Successful ultrasound  guided right thoracentesis yielding 420 mL of pleural fluid. Read by: Brayton El PA-C Electronically Signed   By: Marliss Coots MD   On: 03/13/2021 14:08     TODAY-DAY OF DISCHARGE:  Subjective:   Brandon [name] today has no headache,no chest abdominal pain,no new weakness tingling or numbness, feels much better wants to go home today.   Objective:   Blood pressure 112/61, pulse 97, temperature 97.7 F (36.5 C), temperature source Oral, resp. rate 17, height  (1.854 m), weight 81.8 kg, SpO2 95 %.  Intake/Output Summary (Last 24 hours) at 03/15/2021 1544 Last data filed at 03/15/2021 1150 Gross per 24 hour  Intake 3887.46 ml  Output 1500 ml  Net 2387.46 ml   Filed Weights   03/03/21 1835 03/14/21 1258 03/15/21 0444  Weight: 85.3 kg 85.3 kg 81.8 kg  Exam: Awake Alert, Oriented *3, No new F.N deficits, Normal affect [place].AT,PERRAL Supple Neck,No JVD, No cervical lymphadenopathy appriciated.  Symmetrical Chest wall movement, Good air movement bilaterally, CTAB RRR,No Gallops,Rubs or new Murmurs, No Parasternal Heave +ve B.Sounds, Abd Soft, Non tender, No organomegaly appriciated, No rebound -guarding or rigidity. No Cyanosis, Clubbing or edema, No new Rash or bruise   PERTINENT RADIOLOGIC STUDIES: ECHO TEE  Result Date: 03/14/2021    TRANSESOPHOGEAL ECHO REPORT   Patient Name:   Brandon [name] Date of Exam: 03/14/2021 Medical Rec #:  [id number]       Height:       73.0 in Accession #:    [id number]      Weight:       188.1 lb Date of Birth:  1951-03-26       BSA:          2.096 m Patient Age:    69 years        BP:           114/73 mmHg Patient Gender: M               HR:           92 bpm. Exam Location:  Inpatient Procedure: Transesophageal Echo, 3D Echo, Cardiac Doppler and Color Doppler Indications:     Endocarditis  History:         Patient has prior history of Echocardiogram examinations, most                  recent 03/04/2021. Arrythmias:Atrial Fibrillation; Risk                   Factors:Dyslipidemia.  Sonographer:     Eulah Pont RDCS Referring Phys:  [id number] Wendall Stade Diagnosing Phys: Charlton Haws MD PROCEDURE: After discussion of the risks and benefits of a TEE, an informed consent was obtained from the patient. The transesophogeal probe was passed without difficulty through the esophogus of the patient. Sedation performed by different physician. The patient was monitored while under deep sedation. Anesthestetic sedation was provided intravenously by Anesthesiology: 228.99mg  of Propofol. The patient's vital signs; including heart rate, blood pressure, and oxygen saturation; remained stable throughout the procedure. The patient developed no complications during the procedure. IMPRESSIONS  1. Left ventricular ejection fraction, by estimation, is 55 to 60%. The left ventricle has normal function.  2. Right ventricular systolic function is normal. The right ventricular size is normal.  3. Left atrial size was mildly dilated. No left atrial/left atrial appendage thrombus was detected.  4. Flail A2 segment with perforation at the base of A2 and large vegetation 2.2 cm on ventricular side extending into papillary muscle. Intervalvular fibrosa intact Also appears to be small vegetation on P2. Findings discussed with CVTS Dr Dorris Fetch     Extensive 3D imaging of MV was performed . The mitral valve is abnormal. 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  5. The aortic valve is tricuspid. There is mild calcification of the aortic valve. Aortic valve regurgitation is trivial. Mild aortic valve sclerosis is present, with no evidence of aortic valve stenosis. FINDINGS  Left Ventricle: Left ventricular ejection fraction, by estimation, is 55 to 60%. The left ventricle has normal function. The left ventricular internal cavity size was normal in size. There is no left ventricular hypertrophy. Right Ventricle: The right ventricular size is normal. No increase in right ventricular wall  thickness. Right ventricular systolic function is normal. Left Atrium: Left atrial size was  mildly dilated. No left atrial/left atrial appendage thrombus was detected. Right Atrium: Right atrial size was normal in size. Pericardium: There is no evidence of pericardial effusion. 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 Intervalvular fibrosa intact Also appears to be small vegetation on P2. Findings discussed with CVTS Dr Dorris Fetch Extensive 3D imaging of MV was performed. The mitral valve is abnormal. 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 Tricuspid Valve: The tricuspid valve is normal in structure. Tricuspid valve regurgitation is mild. Aortic Valve: The aortic valve is tricuspid. There is mild calcification of the aortic valve. Aortic valve regurgitation is trivial. Mild aortic valve sclerosis is present, with no evidence of aortic valve stenosis. Pulmonic Valve: The pulmonic valve was normal in structure. Pulmonic valve regurgitation is mild. Aorta: The aortic root is normal in size and structure. IAS/Shunts: No atrial level shunt detected by color flow Doppler.  MR PISA:        16.08 cm MR PISA Radius: 1.60 cm Charlton Haws MD Electronically signed by Charlton Haws MD Signature Date/Time: 03/14/2021/2:36:40 PM    Final      PERTINENT LAB RESULTS: CBC: Recent Labs    03/14/21 0158 03/15/21 0413  WBC 12.2* 12.5*  HGB 10.2* 10.7*  HCT 31.9* 34.4*  PLT 387 361   CMET CMP     Component Value Date/Time   NA 139 03/15/2021 0413   K 3.8 03/15/2021 0413   CL 103 03/15/2021 0413   CO2 31 03/15/2021 0413   GLUCOSE 100 (H) 03/15/2021 0413   BUN 33 (H) 03/15/2021 0413   CREATININE 0.96 03/15/2021 0413   CALCIUM 8.4 (L) 03/15/2021 0413   PROT 6.2 (L) 03/13/2021 1915   ALBUMIN 3.2 (L) 03/13/2021 1915   AST 19 03/12/2021 0825   ALT 38 03/12/2021 0825   ALKPHOS 92 03/12/2021 0825   BILITOT  1.0 03/12/2021 0825   GFRNONAA >60 03/15/2021 0413   GFRAA >60 07/03/2020 2017    GFR Estimated Creatinine Clearance: 83.2 mL/min (by C-G formula based on SCr of 0.96 mg/dL). No results for input(s): LIPASE, AMYLASE in the last 72 hours. No results for input(s): CKTOTAL, CKMB, CKMBINDEX, TROPONINI in the last 72 hours. Invalid input(s): POCBNP Recent Labs    03/14/21 0158 03/15/21 0413  DDIMER 1.70* 1.64*   No results for input(s): HGBA1C in the last 72 hours. No results for input(s): CHOL, HDL, LDLCALC, TRIG, CHOLHDL, LDLDIRECT in the last 72 hours. No results for input(s): TSH, T4TOTAL, T3FREE, THYROIDAB in the last 72 hours.  Invalid input(s): FREET3 Recent Labs    03/14/21 0158 03/15/21 0413  FERRITIN 349* 393*   Coags: No results for input(s): INR in the last 72 hours.  Invalid input(s): PT Microbiology: Recent Results (from the past 240 hour(s))  Gram stain     Status: None   Collection Time: 03/13/21  2:30 PM   Specimen: Lung, Right; Pleural Fluid  Result Value Ref Range Status   Specimen Description FLUID PLEURAL  Final   Special Requests NONE  Final   Gram Stain   Final    WBC PRESENT, PREDOMINANTLY MONONUCLEAR NO ORGANISMS SEEN CYTOSPIN SMEAR Performed at [facility], [address], [place], [place] [address]    Report Status 03/13/2021 FINAL  Final  Culture, body fluid w Gram Stain-bottle     Status: None (Preliminary result)   Collection Time: 03/13/21  4:30 PM   Specimen: Fluid  Result Value Ref Range Status   Specimen Description FLUID PERITONEAL  Final   Special Requests BOTTLES DRAWN AEROBIC AND ANAEROBIC  Final   Culture   Final    NO GROWTH 2 DAYS Performed at [facility], [address], [place], [place] [address]    Report Status PENDING  Incomplete    FURTHER DISCHARGE INSTRUCTIONS:  Get Medicines reviewed and adjusted: Please take all your medications with you for your next visit with your Primary  MD  Laboratory/radiological data: Please request your Primary MD to go over all hospital tests and procedure/radiological results at the follow up, please ask your Primary MD to get all Hospital records sent to his/her office.  In some cases, they will be blood work, cultures and biopsy results pending at the time of your discharge. Please request that your primary care M.D. goes through all the records of your hospital data and follows up on these results.  Also Note the following: If you experience worsening of your admission symptoms, develop shortness of breath, life threatening emergency, suicidal or homicidal thoughts you must seek medical attention immediately by calling 911 or calling your MD immediately  if symptoms less severe.  You must read complete instructions/literature along with all the possible adverse reactions/side effects for all the Medicines you take and that have been prescribed to you. Take any new Medicines after you have completely understood and accpet all the possible adverse reactions/side effects.   Do not drive when taking Pain medications or sleeping medications (Benzodaizepines)  Do not take more than prescribed Pain, Sleep and Anxiety Medications. It is not advisable to combine anxiety,sleep and pain medications without talking with your primary care practitioner  Special Instructions: If you have smoked or chewed Tobacco  in the last 2 yrs please stop smoking, stop any regular Alcohol  and or any Recreational drug use.  Wear Seat belts while driving.  Please note: You were cared for by a hospitalist during your hospital stay. Once you are discharged, your primary care physician will handle any further medical issues. Please note that NO REFILLS for any discharge medications will be authorized once you are discharged, as it is imperative that you return to your primary care physician (or establish a relationship with a primary care physician if you do not have  one) for your post hospital discharge needs so that they can reassess your need for medications and monitor your lab values.  Total Time spent coordinating discharge including counseling, education and face to face time equals 35 minutes.  SignedJeoffrey Massed 03/15/2021 3:44 PM

## 2021-03-15 NOTE — Evaluation (Signed)
Occupational Therapy Evaluation/Discharge Patient Details Name: Brandon [name] MRN: [id number] DOB: February 29, 1951 Today's Date: 03/15/2021    History of Present Illness 69yo male who presented to the ED on 03/02/21 with possible RUL PNA, declined admission and was discharged from the ED. Blood cultures were then found to be positive for enterococcus and he was asked to return to the hospital, admitted 4/9 and also found to be Covid positive on 4/9. PMH Meneire's disease, HLD, PTX, A-fib, L clavicle fracture s/p ORIF   Clinical Impression   PTA, pt lives with adult grandchildren and reports Independence in all daily tasks without AD. Prior to onset of illnesses 6 months ago, pt was very active and frequented the gym. Pt presents now with minor deficits in strength and endurance but able to demo ADLs and mobility in room Independently without AD. Pt able to manage lines well and no LOB noted. Educated on energy conservation strategies with handout provided, as well as UE HEP using moderate resistance bands to facilitate improved strength. Pt able to demo activities in room on RA with 89% at the lowest but sustained 92%, HR variable but WFL. No further skilled OT services needed at acute level or on DC. Please reconsult if needs change.     Follow Up Recommendations  No OT follow up    Equipment Recommendations  None recommended by OT    Recommendations for Other Services       Precautions / Restrictions Precautions Precautions: Other (comment) Precaution Comments: watch sats Restrictions Weight Bearing Restrictions: No      Mobility Bed Mobility Overal bed mobility: Independent                  Transfers Overall transfer level: Independent Equipment used: None             General transfer comment: no assist, good mgmt of lines    Balance Overall balance assessment: No apparent balance deficits (not formally assessed)                                          ADL either performed or assessed with clinical judgement   ADL Overall ADL's : Independent                                       General ADL Comments: Able to demo mobility to/from bathroom with IV pole and good line mgmt, completed toileting task and hand hygiene with no LOB. SpO2 WFL on RA, as well. Educated on energy conservation strategies with plans to drop off handout in pt room.     Vision Baseline Vision/History: Wears glasses Wears Glasses: At all times Patient Visual Report: No change from baseline Vision Assessment?: No apparent visual deficits     Perception     Praxis      Pertinent Vitals/Pain Pain Assessment: No/denies pain     Hand Dominance Right   Extremity/Trunk Assessment Upper Extremity Assessment Upper Extremity Assessment: Overall WFL for tasks assessed   Lower Extremity Assessment Lower Extremity Assessment: Defer to PT evaluation   Cervical / Trunk Assessment Cervical / Trunk Assessment: Normal   Communication Communication Communication: No difficulties   Cognition Arousal/Alertness: Awake/alert Behavior During Therapy: WFL for tasks assessed/performed Overall Cognitive Status: Within Functional Limits for tasks assessed  General Comments  Trialed on RA for mobility in room, 89% at lowest but rebounded quickly to 92% on RA with standing or seated rest break. 96% and above with replacement of 2 L O2    Exercises     Shoulder Instructions      Home Living Family/patient expects to be discharged to:: Private residence Living Arrangements: Other (Comment) (2 grandchildren in their 93s) Available Help at Discharge: Family;Friend(s);Available 24 hours/day Type of Home: House Home Access: Stairs to enter [employer] of Steps: 5 Entrance Stairs-Rails: Right;Left Home Layout: Two level;Able to live on main level with bedroom/bathroom     Bathroom  Shower/Tub: Chief Strategy Officer: Standard     Home Equipment: Wheelchair - manual;Bedside commode;Other (comment) (has non slip mat in bathtub, may have shower chair that was his late wife's)          Prior Functioning/Environment Level of Independence: Independent        Comments: Independent in all tasks, very active until illnessess 6 months ago. went to gym 3x/week        OT Problem List: Decreased strength;Decreased activity tolerance      OT Treatment/Interventions:      OT Goals(Current goals can be found in the care plan section) Acute Rehab OT Goals Patient Stated Goal: go home when better, return to normal activities OT Goal Formulation: All assessment and education complete, DC therapy  OT Frequency:     Barriers to D/C:            Co-evaluation              AM-PAC OT "6 Clicks" Daily Activity     Outcome Measure Help from another person eating meals?: None Help from another person taking care of personal grooming?: None Help from another person toileting, which includes using toliet, bedpan, or urinal?: None Help from another person bathing (including washing, rinsing, drying)?: None Help from another person to put on and taking off regular upper body clothing?: None Help from another person to put on and taking off regular lower body clothing?: None 6 Click Score: 24   End of Session Nurse Communication: Mobility status  Activity Tolerance: Patient tolerated treatment well Patient left: in bed;with call bell/phone within reach  OT Visit Diagnosis: Muscle weakness (generalized) (M62.81)                Time: 6433-2951 OT Time Calculation (min): 20 min Charges:  OT General Charges $OT Visit: 1 Visit OT Evaluation $OT Eval Low Complexity: 1 Low  Bradd Canary, OTR/L Acute Rehab Services Office: [phone]  Lorre Munroe 03/15/2021, 12:48 PM

## 2021-03-15 NOTE — Progress Notes (Addendum)
PROGRESS NOTE                                                                                                                                                                                                             Patient Demographics:    Brandon [name], is a 70 y.o. male, DOB - 08-12-1951, HYQ:[id number]  Outpatient Primary MD for the patient is Soundra Pilon, FNP   Admit date - 03/03/2021   LOS - 12  Chief Complaint  Patient presents with  . Positive Blood Cultures       Brief Narrative: Patient is a 70 y.o. male with PMHx of COVID infection in October 2021-presented to the ED on 4/8 with fever, cough, shortness of breath-was found to have pneumonia-he refused admission and was discharged home on Augmentin and Zithromax-he returned later to the ED as his blood cultures were positive for Enterococcus.  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  COVID-19 vaccinated status: Vaccinated  Significant Events: 4/9>> Admit to [facility] for pneumonia/enterococcal bacteremia/COVID-19 infection  Significant studies: 4/8>> CTA chest: No PE, right upper lobe pneumonia. 4/8>> CT abdomen: No acute abnormalities 4/10>> Echo: Mitral valve vegetation-moderate MR, EF 60-65% 4/18>> CTA chest: Extensive pneumonia, large bilateral pleural effusions.  No PE. 4/20>> TEE: Severe MR-flail anterior leaflet with perforation   COVID-19 medications: Steroids: 4/12>> Remdesivir: 4/10>> 4/14  Antibiotics: Ampicillin: 4/9>> Rocephin: 4/9>>  Microbiology data: 4/8>> blood culture: Enterococcus faecalis 4/9>> blood culture: No growth 4/9>> urine culture: Negative 4/19>> pleural fluid culture: No growth  Procedures: 4/19>> right thoracocentesis by IR 4/19>> right PICC line 4/20>> TEE   Consults: ID, cardiothoracic surgery, cardiology, IR  DVT prophylaxis: enoxaparin  (LOVENOX) injection 40 mg Start: 03/13/21 0800 Place TED hose Start: 03/11/21 1757    Subjective:    No chest pain-stable on 2 L of oxygen-no other complaints.   Assessment  & Plan :   Sepsis due to enterococcal bacteremia with mitral valve endocarditis: Sepsis physiology has resolved-cultures/antimicrobial therapy as above.  TEE with severe MR-flail A2 segment with perforation-cardiology has discussed with [facility]-has been accepted for transfer-however on a waiting list-and will require RHC/LHC preoperatively which is tentatively scheduled for tomorrow.  Acute hypoxic respiratory failure due to multifocal pneumonia from Enterococcus and COVID-19: Improving-only on 2 L of oxygen-continue to  taper down steroids rapidly.  No longer on isolation.    Bilateral pleural effusions: Transudative by lights criteria.  Suspect this could be from hypoalbuminemia and probably diastolic CHF in the setting of mitral regurgitation.  Continue oral furosemide.  Hypokalemia: Repleted.  Normocytic anemia: Due to acute illness-monitor CBC periodically.  No evidence of blood loss.  Hypothyroidism: Continue levothyroxine  Nutrition Problem: Nutrition Problem: Increased nutrient needs Etiology: acute illness Signs/Symptoms: estimated needs Interventions: Ensure Enlive (each supplement provides 350kcal and 20 grams of protein),MVI   GI prophylaxis: PPI  ABG:    Component Value Date/Time   TCO2 27 03/30/2016 1130    Vent Settings: N/A    Condition -Stable  Family Communication  : None at bedside.  Code Status :  Full Code  Diet :  Diet Order            Diet regular Room service appropriate? Yes; Fluid consistency: Thin  Diet effective now                  Disposition Plan  :   Status is: Inpatient  Remains inpatient appropriate because:Inpatient level of care appropriate due to severity of illness   Dispo: The patient is from: Home              Anticipated d/c is to: Home               Patient currently is not medically stable to d/c.   Difficult to place patient No   Barriers to discharge: Enterococcus bacteremia-mitral valve endocarditis-work-up in progress.  Antimicorbials  :    Anti-infectives (From admission, onward)   Start     Dose/Rate Route Frequency Ordered Stop   03/05/21 1000  remdesivir 100 mg in sodium chloride 0.9 % 100 mL IVPB       "Followed by" Linked Group Details   100 mg 200 mL/hr over 30 Minutes Intravenous Daily 03/04/21 0035 03/08/21 1024   03/04/21 1000  remdesivir 100 mg in sodium chloride 0.9 % 100 mL IVPB  Status:  Discontinued       "Followed by" Linked Group Details   100 mg 200 mL/hr over 30 Minutes Intravenous Daily 03/03/21 1806 03/04/21 0035   03/04/21 1000  remdesivir 200 mg in sodium chloride 0.9% 250 mL IVPB       "Followed by" Linked Group Details   200 mg 580 mL/hr over 30 Minutes Intravenous Once 03/04/21 0035 03/04/21 1230   03/03/21 2000  cefTRIAXone (ROCEPHIN) 2 g in sodium chloride 0.9 % 100 mL IVPB        2 g 200 mL/hr over 30 Minutes Intravenous Every 12 hours 03/03/21 1708 04/14/21 2359   03/03/21 2000  remdesivir 200 mg in sodium chloride 0.9% 250 mL IVPB  Status:  Discontinued       "Followed by" Linked Group Details   200 mg 580 mL/hr over 30 Minutes Intravenous Once 03/03/21 1806 03/04/21 0035   03/03/21 1600  ampicillin (OMNIPEN) 2 g in sodium chloride 0.9 % 100 mL IVPB        2 g 300 mL/hr over 20 Minutes Intravenous Every 4 hours 03/03/21 1525 04/14/21 2359      Inpatient Medications  Scheduled Meds: . vitamin C  500 mg Oral Daily  . Chlorhexidine Gluconate Cloth  6 each Topical Daily  . cholecalciferol  1,000 Units Oral Daily  . enoxaparin (LOVENOX) injection  40 mg Subcutaneous Q24H  . feeding supplement  237 mL Oral BID BM  .  furosemide  40 mg Oral Daily  . levothyroxine  175 mcg Oral QAC breakfast  . mouth rinse  15 mL Mouth Rinse BID  . multivitamin with minerals  1 tablet Oral Daily  .  pantoprazole  40 mg Oral Daily  . predniSONE  40 mg Oral Q breakfast  . sodium chloride flush  3 mL Intravenous Q12H  . traZODone  50 mg Oral QHS  . zinc sulfate  220 mg Oral Daily   Continuous Infusions: . sodium chloride Stopped (03/14/21 1900)  . sodium chloride 5 mL/hr at 03/15/21 0555  . ampicillin (OMNIPEN) IV 2 g (03/15/21 1328)  . cefTRIAXone (ROCEPHIN)  IV 2 g (03/15/21 0850)   PRN Meds:.sodium chloride, sodium chloride, acetaminophen **OR** acetaminophen, albuterol, chlorpheniramine-HYDROcodone, fluticasone, guaiFENesin-dextromethorphan, hydrALAZINE, lidocaine (PF), sodium chloride flush   Time Spent in minutes  25  See all Orders from today for further details   Jeoffrey Massed M.D on 03/15/2021 at 3:09 PM  To page go to [email or web address] - use universal password  [facility] -  Office  [phone]    Objective:   Vitals:   03/15/21 0417 03/15/21 0444 03/15/21 0748 03/15/21 1225  BP: 122/71  129/68 112/61  Pulse: 81  95 97  Resp: 18  16 17   Temp: 97.7 F (36.5 C)  98 F (36.7 C) 97.7 F (36.5 C)  TempSrc: Oral  Oral Oral  SpO2: 92%  93% 95%  Weight:  81.8 kg    Height:        Wt Readings from Last 3 Encounters:  03/15/21 81.8 kg  03/02/21 86.4 kg  09/19/20 89.4 kg     Intake/Output Summary (Last 24 hours) at 03/15/2021 1509 Last data filed at 03/15/2021 1150 Gross per 24 hour  Intake 3887.46 ml  Output 1500 ml  Net 2387.46 ml     Physical Exam Gen Exam:Alert awake-not in any distress HEENT:atraumatic, normocephalic Chest: B/L clear to auscultation anteriorly CVS:S1S2 regular Abdomen:soft non tender, non distended Extremities:no edema Neurology: Non focal Skin: no rash   Data Review:    CBC Recent Labs  Lab 03/11/21 0355 03/12/21 0825 03/13/21 0155 03/14/21 0158 03/15/21 0413  WBC 13.9* 10.0 19.9* 12.2* 12.5*  HGB 11.8* 11.2* 11.8* 10.2* 10.7*  HCT 37.2* 35.0* 36.2* 31.9* 34.4*  PLT 468* 428* 469* 387 361  MCV 95.9 94.9  94.0 95.5 97.5  MCH 30.4 30.4 30.6 30.5 30.3  MCHC 31.7 32.0 32.6 32.0 31.1  RDW 13.9 14.2 14.7 14.9 14.9  LYMPHSABS 2.3 0.8 0.8 0.6* 1.5  MONOABS 1.0 0.2 0.7 0.6 0.7  EOSABS 0.0 0.0 0.0 0.0 0.0  BASOSABS 0.0 0.0 0.0 0.0 0.0    Chemistries  Recent Labs  Lab 03/09/21 0040 03/10/21 0503 03/11/21 0355 03/12/21 0825 03/13/21 0155 03/14/21 0158 03/15/21 0413  NA 138 138 136 140 137  --  139  K 3.7 3.9 3.8 4.7 3.3*  --  3.8  CL 102 103 99 100 95*  --  103  CO2 25 26 29  33* 32  --  31  GLUCOSE 142* 156* 105* 138* 126*  --  100*  BUN 32* 29* 25* 22 25*  --  33*  CREATININE 0.97 1.03 0.96 1.14 1.01  --  0.96  CALCIUM 8.2* 8.2* 8.6* 8.9 9.1  --  8.4*  MG 2.0 2.1 2.1 2.1 2.0 2.2 2.2  AST 28 23 23 19   --   --   --   ALT 50* 45* 46* 38  --   --   --  ALKPHOS 110 97 107 92  --   --   --   BILITOT 0.5 0.6 0.6 1.0  --   --   --    ------------------------------------------------------------------------------------------------------------------ No results for input(s): CHOL, HDL, LDLCALC, TRIG, CHOLHDL, LDLDIRECT in the last 72 hours.  No results found for: HGBA1C ------------------------------------------------------------------------------------------------------------------ No results for input(s): TSH, T4TOTAL, T3FREE, THYROIDAB in the last 72 hours.  Invalid input(s): FREET3 ------------------------------------------------------------------------------------------------------------------ Recent Labs    03/14/21 0158 03/15/21 0413  FERRITIN 349* 393*    Coagulation profile No results for input(s): INR, PROTIME in the last 168 hours.  Recent Labs    03/14/21 0158 03/15/21 0413  DDIMER 1.70* 1.64*    Cardiac Enzymes No results for input(s): CKMB, TROPONINI, MYOGLOBIN in the last 168 hours.  Invalid input(s): CK ------------------------------------------------------------------------------------------------------------------    Component Value Date/Time   BNP 331.0  (H) 03/13/2021 0155    Micro Results Recent Results (from the past 240 hour(s))  Gram stain     Status: None   Collection Time: 03/13/21  2:30 PM   Specimen: Lung, Right; Pleural Fluid  Result Value Ref Range Status   Specimen Description FLUID PLEURAL  Final   Special Requests NONE  Final   Gram Stain   Final    WBC PRESENT, PREDOMINANTLY MONONUCLEAR NO ORGANISMS SEEN CYTOSPIN SMEAR Performed at [facility], [address] [place], [place] [address]    Report Status 03/13/2021 FINAL  Final  Culture, body fluid w Gram Stain-bottle     Status: None (Preliminary result)   Collection Time: 03/13/21  4:30 PM   Specimen: Fluid  Result Value Ref Range Status   Specimen Description FLUID PERITONEAL  Final   Special Requests BOTTLES DRAWN AEROBIC AND ANAEROBIC  Final   Culture   Final    NO GROWTH 2 DAYS Performed at [facility], [address], [place], [place] [address]    Report Status PENDING  Incomplete    Radiology Reports DG Chest 1 View  Result Date: 03/13/2021 CLINICAL DATA:  Status post left thoracentesis. EXAM: CHEST  1 VIEW COMPARISON:  CT 03/12/2021.  Chest x-ray 03/08/2021. FINDINGS: Stable cardiomegaly. Bilateral prominent pulmonary infiltrates, predominantly in the upper lobes, are again noted. Tiny bilateral pleural effusions cannot be excluded. Very tiny questionable left apical pneumothorax noted following thoracentesis. Postsurgical changes left clavicle. Surgical clips noted over the neck. IMPRESSION: 1. Very questionable tiny left apical pneumothorax post thoracentesis. 2. Persistent prominent bilateral pulmonary infiltrates, predominantly in the upper lobes, again noted. Critical Value/emergent results were called by telephone at the time of interpretation on 03/13/2021 at 2:07 pm to provider [facility] , who verbally acknowledged these results. Electronically Signed   By: Maisie Fus  Register   On: 03/13/2021 14:07   CT ANGIO CHEST PE W OR WO  CONTRAST  Result Date: 03/12/2021 CLINICAL DATA:  COVID pneumonia. Elevated D-dimer. Worsening shortness of breath EXAM: CT ANGIOGRAPHY CHEST WITH CONTRAST TECHNIQUE: Multidetector CT imaging of the chest was performed using the standard protocol during bolus administration of intravenous contrast. Multiplanar CT image reconstructions and MIPs were obtained to evaluate the vascular anatomy. CONTRAST:  69mL OMNIPAQUE IOHEXOL 350 MG/ML SOLN COMPARISON:  03/02/2021 FINDINGS: Cardiovascular: No filling defects in the pulmonary arteries to suggest pulmonary emboli. Heart is mildly enlarged. Aorta normal caliber. Scattered calcifications in the aorta. Mediastinum/Nodes: Borderline size mediastinal lymph nodes diffusely, likely reactive. Trachea and esophagus are unremarkable. Thyroid unremarkable. Lungs/Pleura: Extensive bilateral airspace disease, worsening significantly since prior study particularly in  the mid and upper lung zones. Large bilateral pleural effusions. Upper Abdomen: Imaging into the upper abdomen demonstrates no acute findings. Musculoskeletal: Chest wall soft tissues are unremarkable. No acute bony abnormality. Review of the MIP images confirms the above findings. IMPRESSION: Extensive bilateral airspace disease, particularly in the mid and upper lung fields most compatible with pneumonia or ARDS. Large bilateral pleural effusions. Cardiomegaly. No evidence of pulmonary embolus. Electronically Signed   By: Charlett Nose M.D.   On: 03/12/2021 03:00   CT Angio Chest PE W/Cm &/Or Wo Cm  Result Date: 03/02/2021 CLINICAL DATA:  Fever, abdominal pain. EXAM: CT ANGIOGRAPHY CHEST CT ABDOMEN AND PELVIS WITH CONTRAST TECHNIQUE: Multidetector CT imaging of the chest was performed using the standard protocol during bolus administration of intravenous contrast. Multiplanar CT image reconstructions and MIPs were obtained to evaluate the vascular anatomy. Multidetector CT imaging of the abdomen and pelvis was  performed using the standard protocol during bolus administration of intravenous contrast. Unfortunately, there is motion artifact during the abdominal scan, limiting these images. CONTRAST:  OMNIPAQUE IOHEXOL 350 MG/ML SOLN COMPARISON:  March 10, 2017. FINDINGS: CTA CHEST FINDINGS Cardiovascular: Satisfactory opacification of the pulmonary arteries to the segmental level. No evidence of pulmonary embolism. Normal heart size. No pericardial effusion. Mediastinum/Nodes: Status post thyroidectomy. The esophagus is unremarkable. No adenopathy is noted. Lungs/Pleura: No pneumothorax or pleural effusion is noted. Right upper lobe airspace opacity is noted concerning for pneumonia. Musculoskeletal: No chest wall abnormality. No acute or significant osseous findings. Review of the MIP images confirms the above findings. CT ABDOMEN and PELVIS FINDINGS Hepatobiliary: No focal liver abnormality is seen. No gallstones, gallbladder wall thickening, or biliary dilatation. Pancreas: Unremarkable. No pancreatic ductal dilatation or surrounding inflammatory changes. Spleen: Normal in size without focal abnormality. Adrenals/Urinary Tract: Adrenal glands appear normal. Visualized portions of kidneys are unremarkable, although the lower poles are not well visualized due to patient motion artifact. No definite hydronephrosis or renal obstruction is noted. No definite renal or ureteral calculi are noted. Urinary bladder is unremarkable. Stomach/Bowel: Stomach is within normal limits. Appendix appears normal. No evidence of bowel wall thickening, distention, or inflammatory changes. Vascular/Lymphatic: No significant vascular findings are present. No enlarged abdominal or pelvic lymph nodes. Reproductive: Stable mild prostatic enlargement is noted. Other: Small fat containing right inguinal hernia is noted. No ascites is noted. Musculoskeletal: No acute or significant osseous findings. Review of the MIP images confirms the above  findings. IMPRESSION: No definite evidence of pulmonary embolus. Probable right upper lobe pneumonia. Limited evaluation of the kidneys is noted due to patient motion artifact. Lower poles are not well visualized and therefore pathology in these areas cannot be excluded. No definite abnormality is seen involving the visualized portions of the kidneys. Small fat containing right inguinal hernia. Stable mild prostatic enlargement. Electronically Signed   By: Lupita Raider M.D.   On: 03/02/2021 16:49   CT Abdomen Pelvis W Contrast  Result Date: 03/02/2021 CLINICAL DATA:  Fever, abdominal pain. EXAM: CT ANGIOGRAPHY CHEST CT ABDOMEN AND PELVIS WITH CONTRAST TECHNIQUE: Multidetector CT imaging of the chest was performed using the standard protocol during bolus administration of intravenous contrast. Multiplanar CT image reconstructions and MIPs were obtained to evaluate the vascular anatomy. Multidetector CT imaging of the abdomen and pelvis was performed using the standard protocol during bolus administration of intravenous contrast. Unfortunately, there is motion artifact during the abdominal scan, limiting these images. CONTRAST:  OMNIPAQUE IOHEXOL 350 MG/ML SOLN COMPARISON:  March 10, 2017.  FINDINGS: CTA CHEST FINDINGS Cardiovascular: Satisfactory opacification of the pulmonary arteries to the segmental level. No evidence of pulmonary embolism. Normal heart size. No pericardial effusion. Mediastinum/Nodes: Status post thyroidectomy. The esophagus is unremarkable. No adenopathy is noted. Lungs/Pleura: No pneumothorax or pleural effusion is noted. Right upper lobe airspace opacity is noted concerning for pneumonia. Musculoskeletal: No chest wall abnormality. No acute or significant osseous findings. Review of the MIP images confirms the above findings. CT ABDOMEN and PELVIS FINDINGS Hepatobiliary: No focal liver abnormality is seen. No gallstones, gallbladder wall thickening, or biliary dilatation. Pancreas:  Unremarkable. No pancreatic ductal dilatation or surrounding inflammatory changes. Spleen: Normal in size without focal abnormality. Adrenals/Urinary Tract: Adrenal glands appear normal. Visualized portions of kidneys are unremarkable, although the lower poles are not well visualized due to patient motion artifact. No definite hydronephrosis or renal obstruction is noted. No definite renal or ureteral calculi are noted. Urinary bladder is unremarkable. Stomach/Bowel: Stomach is within normal limits. Appendix appears normal. No evidence of bowel wall thickening, distention, or inflammatory changes. Vascular/Lymphatic: No significant vascular findings are present. No enlarged abdominal or pelvic lymph nodes. Reproductive: Stable mild prostatic enlargement is noted. Other: Small fat containing right inguinal hernia is noted. No ascites is noted. Musculoskeletal: No acute or significant osseous findings. Review of the MIP images confirms the above findings. IMPRESSION: No definite evidence of pulmonary embolus. Probable right upper lobe pneumonia. Limited evaluation of the kidneys is noted due to patient motion artifact. Lower poles are not well visualized and therefore pathology in these areas cannot be excluded. No definite abnormality is seen involving the visualized portions of the kidneys. Small fat containing right inguinal hernia. Stable mild prostatic enlargement. Electronically Signed   By: Lupita Raider M.D.   On: 03/02/2021 16:49   DG Chest Port 1 View  Result Date: 03/08/2021 CLINICAL DATA:  Shortness of breath.  Recent COVID-19 positive EXAM: PORTABLE CHEST 1 VIEW COMPARISON:  March 06, 2021 chest radiograph; chest radiograph and chest CT March 02, 2021 FINDINGS: There remains airspace opacity throughout the right upper lobe with volume loss. Airspace opacity throughout much of the left upper lobe as well as in the left lower lung region and to a lesser extent in the right base remain. Heart is  borderline enlarged with pulmonary vascular normal. No adenopathy. Postoperative change left clavicle. Evidence of previous thyroidectomy. IMPRESSION: Multifocal airspace opacity, most pronounced in the right upper lobe. There is slight increase in right upper lobe consolidation and volume loss compared to most recent study. Ill-defined airspace opacity elsewhere unchanged. Suspect multifocal pneumonia, potentially of atypical organism etiology. Stable cardiac silhouette. Electronically Signed   By: Bretta Bang III M.D.   On: 03/08/2021 07:56   DG Chest Port 1 View  Result Date: 03/06/2021 CLINICAL DATA:  70 year old male positive COVID-19 on 03/03/2021. Shortness of breath. EXAM: PORTABLE CHEST 1 VIEW COMPARISON:  Portable chest 03/03/2021. FINDINGS: Portable AP semi upright view at 0830 hours. Progressed and extensive airspace opacity now in the right upper lobe, about both hila. Left lung apex and the lateral costophrenic angles remain relatively spared. Stable lung volumes and mediastinal contours. No pneumothorax. Visualized tracheal air column is within normal limits. Prior thyroidectomy and left clavicle ORIF. IMPRESSION: Progressed bilateral pneumonia, now moderate to severe. No pneumothorax or pleural effusion. Electronically Signed   By: Odessa Fleming M.D.   On: 03/06/2021 08:34   DG Chest Port 1 View  Result Date: 03/03/2021 CLINICAL DATA:  Fever. EXAM: PORTABLE CHEST 1 VIEW  COMPARISON:  03/02/2021 FINDINGS: 1535 hours. Patchy airspace disease noted right upper lung and right base. Interstitial markings are diffusely coarsened with chronic features. Bones are diffusely demineralized. Telemetry leads overlie the chest. IMPRESSION: Similar appearance of patchy airspace disease right upper and lower lung compatible with pneumonia. Chronic interstitial changes. Electronically Signed   By: Kennith Center M.D.   On: 03/03/2021 16:03   DG Chest Port 1 View  Result Date: 03/02/2021 CLINICAL DATA:   Shortness of breath EXAM: PORTABLE CHEST 1 VIEW COMPARISON:  July 03, 2020 FINDINGS: There is diffuse interstitial thickening without frank edema or consolidation. Heart size and pulmonary vascularity are normal. No adenopathy. Evidence of prior trauma with postoperative fixation left clavicle. Evidence of old healed fracture anterior right second rib. IMPRESSION: Diffuse interstitial thickening, likely representing a degree of chronic bronchitis. No edema or consolidation. Heart size normal. Electronically Signed   By: Bretta Bang III M.D.   On: 03/02/2021 12:13   ECHOCARDIOGRAM COMPLETE  Result Date: 03/04/2021    ECHOCARDIOGRAM REPORT   Patient Name:   GINO GARRABRANT Date of Exam: 03/04/2021 Medical Rec #:  [id number]       Height:       73.0 in Accession #:    [id number]      Weight:       188.1 lb Date of Birth:  February 06, 1951       BSA:          2.096 m Patient Age:    69 years        BP:           137/77 mmHg Patient Gender: M               HR:           114 bpm. Exam Location:  Inpatient Procedure: 2D Echo, 3D Echo, Cardiac Doppler and Color Doppler Indications:     Bacteremia.  History:         Patient has no prior history of Echocardiogram examinations.                  Abnormal ECG, Arrythmias:Atrial Fibrillation,                  Signs/Symptoms:Bacteremia; Risk Factors:Hypertension and                  Dyslipidemia. Covid infection. Left pneumothorax.  Sonographer:     Sheralyn Boatman RDCS Referring Phys:  [id number] Scheryl Marten CHIU Diagnosing Phys: Laurance Flatten MD  Sonographer Comments: Technically difficult study. IMPRESSIONS  1. There is a mass present on the anterior mitral valve leaflet concerning for a vegetation that measures 1.4x1.0cm. Suspect moderate mitral regurgitation with two separate jets that are posteriorly and anteriorly directed raising concern for possible leaflet perforation.The mean gradient across the MV is (HR 100) with MVA by continuity of 1.35cm2 suggesting at least mild  mitral stenosis in the setting of the mitral valve vegetation as detailed above. Recommend TEE for further evaluation.  2. Left ventricular ejection fraction, by estimation, is 60 to 65%. The left ventricle has normal function. The left ventricle has no regional wall motion abnormalities. There is moderate asymmetric left ventricular hypertrophy of the basal-septal segment. There is mild LVH of the rest of the LV segments. Left ventricular diastolic parameters are consistent with Grade I diastolic dysfunction (impaired relaxation).  3. Right ventricular systolic function is normal. The right ventricular size is normal. There is severely elevated pulmonary artery  systolic pressure. The estimated right ventricular systolic pressure is 71.0 mmHg.  4. The aortic valve is tricuspid. There is mild calcification of the aortic valve. There is moderate thickening of the aortic valve. Aortic valve regurgitation is mild. Mild to moderate aortic valve sclerosis/calcification is present, without any evidence of aortic stenosis. No distinct vegetations visualized, but the valve is thickened.  5. The inferior vena cava is normal in size with greater than 50% respiratory variability, suggesting right atrial pressure of 3 mmHg. Comparison(s): No prior Echocardiogram. Conclusion(s)/Recommendation(s): Findings concerning for mitral valve vegetation, would recommend a Transesophageal Echocardiogram for clarification. FINDINGS  Left Ventricle: Left ventricular ejection fraction, by estimation, is 60 to 65%. The left ventricle has normal function. The left ventricle has no regional wall motion abnormalities. 3D left ventricular ejection fraction analysis performed but not reported based on interpreter judgement due to suboptimal quality. The left ventricular internal cavity size was normal in size. There is moderate asymmetric left ventricular hypertrophy of the basal-septal segment. Left ventricular diastolic parameters are consistent  with Grade I diastolic dysfunction (impaired relaxation). There is flow acceleration in the LVOT, however, no systolic anterior motion visualized. Right Ventricle: The right ventricular size is normal. No increase in right ventricular wall thickness. Right ventricular systolic function is normal. There is severely elevated pulmonary artery systolic pressure. The tricuspid regurgitant velocity is 3.74 m/s, and with an assumed right atrial pressure of 15 mmHg, the estimated right ventricular systolic pressure is 71.0 mmHg. Left Atrium: Left atrial size was normal in size. Right Atrium: Right atrial size was normal in size. Pericardium: There is no evidence of pericardial effusion. Mitral Valve: There is a mass present on the anterior mitral valve leaflet concerning for a vegetation that measures 1.4x1.0cm. Suspect moderate mitral regurgitation with two separate jets that are posteriorly and anteriorly directed raising concern for possible leaflet perforation.The mean gradient across the MV is with MVA by continuity of 1.35cm2 suggesting at least mild mitral stenosis in the setting of the mitral valve vegetation as detailed above. Recommend TEE for further evaluation. The mitral valve is abnormal. There is moderate thickening of the mitral valve leaflet(s). There is severe calcification of the mitral valve leaflet(s). Mild mitral annular calcification. Moderate mitral valve regurgitation. Mild to moderate mitral valve stenosis. MV peak gradient, 22.1 mmHg. The mean mitral valve gradient is 8.0 mmHg. Tricuspid Valve: The tricuspid valve is normal in structure. Tricuspid valve regurgitation is trivial. Aortic Valve: The aortic valve is tricuspid. There is mild calcification of the aortic valve. There is moderate thickening of the aortic valve. Aortic valve regurgitation is mild. Mild to moderate aortic valve sclerosis/calcification is present, without any evidence of aortic stenosis. Pulmonic Valve: The pulmonic  valve was normal in structure. Pulmonic valve regurgitation is trivial. Aorta: The aortic root and ascending aorta are structurally normal, with no evidence of dilitation. Venous: The inferior vena cava is normal in size with greater than 50% respiratory variability, suggesting right atrial pressure of 3 mmHg. IAS/Shunts: No atrial level shunt detected by color flow Doppler.  LEFT VENTRICLE PLAX 2D LVIDd:         4.10 cm      Diastology LVIDs:         2.70 cm      LV e' medial:    9.79 cm/s LV PW:         1.40 cm      LV E/e' medial:  14.5 LV IVS:        1.70  cm      LV e' lateral:   8.92 cm/s LVOT diam:     2.10 cm      LV E/e' lateral: 15.9 LV SV:         65 LV SV Index:   31 LVOT Area:     3.46 cm  LV Volumes (MOD) LV vol d, MOD A2C: 77.3 ml LV vol d, MOD A4C: 104.0 ml LV vol s, MOD A2C: 18.3 ml LV vol s, MOD A4C: 38.5 ml LV SV MOD A2C:     59.0 ml LV SV MOD A4C:     104.0 ml LV SV MOD BP:      72.3 ml RIGHT VENTRICLE             IVC RV S prime:     10.00 cm/s  IVC diam: 2.00 cm TAPSE (M-mode): 1.3 cm LEFT ATRIUM           Index       RIGHT ATRIUM           Index LA diam:      6.00 cm 2.86 cm/m  RA Area:     14.00 cm LA Vol (A2C): 72.1 ml 34.40 ml/m RA Volume:   26.60 ml  12.69 ml/m LA Vol (A4C): 51.1 ml 24.38 ml/m  AORTIC VALVE             PULMONIC VALVE LVOT Vmax:   109.00 cm/s PR End Diast Vel: 1.86 msec LVOT Vmean:  74.500 cm/s LVOT VTI:    0.188 m  AORTA Ao Root diam: 3.70 cm Ao Asc diam:  3.40 cm MITRAL VALVE                 TRICUSPID VALVE MV Area (PHT): 3.21 cm      TR Peak grad:   56.0 mmHg MV Area VTI:   1.48 cm      TR Vmax:        374.00 cm/s MV Peak grad:  22.1 mmHg MV Mean grad:  8.0 mmHg      SHUNTS MV Vmax:       2.35 m/s      Systemic VTI:  0.19 m MV Vmean:      133.0 cm/s    Systemic Diam: 2.10 cm MV Decel Time: 236 msec MR Peak grad:    70.6 mmHg MR Mean grad:    53.0 mmHg MR Vmax:         420.00 cm/s MR Vmean:        351.0 cm/s MR PISA:         4.02 cm MR PISA Eff ROA: 36 mm MR PISA  Radius:  0.80 cm MV E velocity: 141.50 cm/s MV A velocity: 157.00 cm/s MV E/A ratio:  0.90 Laurance Flatten MD Electronically signed by Laurance Flatten MD Signature Date/Time: 03/04/2021/12:56:47 PM    Final (Updated)    ECHO TEE  Result Date: 03/14/2021    TRANSESOPHOGEAL ECHO REPORT   Patient Name:   LIMUEL NIEBLAS Date of Exam: 03/14/2021 Medical Rec #:  [id number]       Height:       73.0 in Accession #:    [id number]      Weight:       188.1 lb Date of Birth:  Feb 07, 1951       BSA:          2.096 m Patient Age:    4 years  BP:           114/73 mmHg Patient Gender: M               HR:           92 bpm. Exam Location:  Inpatient Procedure: Transesophageal Echo, 3D Echo, Cardiac Doppler and Color Doppler Indications:     Endocarditis  History:         Patient has prior history of Echocardiogram examinations, most                  recent 03/04/2021. Arrythmias:Atrial Fibrillation; Risk                  Factors:Dyslipidemia.  Sonographer:     Eulah Pont RDCS Referring Phys:  [id number] Wendall Stade Diagnosing Phys: Charlton Haws MD PROCEDURE: After discussion of the risks and benefits of a TEE, an informed consent was obtained from the patient. The transesophogeal probe was passed without difficulty through the esophogus of the patient. Sedation performed by different physician. The patient was monitored while under deep sedation. Anesthestetic sedation was provided intravenously by Anesthesiology: 228.99mg  of Propofol. The patient's vital signs; including heart rate, blood pressure, and oxygen saturation; remained stable throughout the procedure. The patient developed no complications during the procedure. IMPRESSIONS  1. Left ventricular ejection fraction, by estimation, is 55 to 60%. The left ventricle has normal function.  2. Right ventricular systolic function is normal. The right ventricular size is normal.  3. Left atrial size was mildly dilated. No left atrial/left atrial appendage thrombus was  detected.  4. Flail A2 segment with perforation at the base of A2 and large vegetation 2.2 cm on ventricular side extending into papillary muscle. Intervalvular fibrosa intact Also appears to be small vegetation on P2. Findings discussed with CVTS Dr Dorris Fetch     Extensive 3D imaging of MV was performed . The mitral valve is abnormal. 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  5. The aortic valve is tricuspid. There is mild calcification of the aortic valve. Aortic valve regurgitation is trivial. Mild aortic valve sclerosis is present, with no evidence of aortic valve stenosis. FINDINGS  Left Ventricle: Left ventricular ejection fraction, by estimation, is 55 to 60%. The left ventricle has normal function. The left ventricular internal cavity size was normal in size. There is no left ventricular hypertrophy. Right Ventricle: The right ventricular size is normal. No increase in right ventricular wall thickness. Right ventricular systolic function is normal. Left Atrium: Left atrial size was mildly dilated. No left atrial/left atrial appendage thrombus was detected. Right Atrium: Right atrial size was normal in size. Pericardium: There is no evidence of pericardial effusion. 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 Intervalvular fibrosa intact Also appears to be small vegetation on P2. Findings discussed with CVTS Dr Dorris Fetch Extensive 3D imaging of MV was performed. The mitral valve is abnormal. 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 Tricuspid Valve: The tricuspid valve is normal in structure. Tricuspid valve regurgitation is mild. Aortic Valve: The aortic valve is tricuspid. There is mild calcification of the aortic valve. Aortic valve regurgitation is trivial. Mild aortic valve sclerosis is present, with no evidence of aortic valve stenosis. Pulmonic Valve: The pulmonic valve was normal in structure.  Pulmonic valve regurgitation is mild. Aorta: The aortic root is normal in size and structure. IAS/Shunts: No atrial level shunt detected by color flow Doppler.  MR  PISA:        16.08 cm MR PISA Radius: 1.60 cm Charlton HawsPeter Nishan MD Electronically signed by Charlton HawsPeter Nishan MD Signature Date/Time: 03/14/2021/2:36:40 PM    Final    US EKG SITE RITE  Result Date: 03/12/2021 If Site Rite image not attached, placement could not be confirmed due to current cardiac rhythm.  IR THORACENTESIS ASP PLEURAL SPACE W/IMG GUIDE  Result Date: 03/13/2021 INDICATION: COVID pneumonia. Shortness of breath. Right-sided pleural effusion. Request for diagnostic therapeutic thoracentesis. EXAM: ULTRASOUND GUIDED RIGHT THORACENTESIS MEDICATIONS: 1% plain lidocaine, 8 mL COMPLICATIONS: None immediate. PROCEDURE: An ultrasound guided thoracentesis was thoroughly discussed with the patient and questions answered. The benefits, risks, alternatives and complications were also discussed. The patient understands and wishes to proceed with the procedure. Written consent was obtained. Ultrasound was performed to localize and mark an adequate pocket of fluid in the right chest. The area was then prepped and draped in the normal sterile fashion. 1% Lidocaine was used for local anesthesia. Under ultrasound guidance a 6 Fr Safe-T-Centesis catheter was introduced. Thoracentesis was performed. The catheter was removed and a dressing applied. FINDINGS: A total of approximately 420 mL of slightly hazy yellow fluid was removed. Samples were sent to the laboratory as requested by the clinical team. IMPRESSION: Successful ultrasound guided right thoracentesis yielding 420 mL of pleural fluid. Read by: Brayton ElKevin Bruning PA-C Electronically Signed   By: Marliss Cootsylan  Suttle MD   On: 03/13/2021 14:08

## 2021-03-15 NOTE — Progress Notes (Signed)
[facility]    Date of Admission:  03/03/2021      ID: TRAVONTAE FREIBERGER is a 70 y.o. male with  Principal Problem:   Endocarditis of mitral valve Active Problems:   Sepsis (HCC)   Bacteremia   COVID-19 virus infection   Osler node   Acute respiratory failure with hypoxia (HCC)   Pneumonia due to COVID-19 virus   CAP (community acquired pneumonia)   Elevated liver enzymes    Subjective: Afebrile, tolerated TEE which found:Flail A2 segment with perforation at the base of A2 and large vegetation 2.2 cm on ventricular side extending into papillary muscle. Intervalvular fibrosa intact.Also appears to be small vegetation on P2. Torrential MVR.   Seen by heart failure and CT surgery team who both feel too high risk for valve replacement in the setting of moderate covid-pneumonia. Discussion to have patient stay vs transfer to [facility] for 2nd opinion.  Patient remains afebrile on 2L [place]. Denies dysphagia or sore throat from TEE.  Medications:  . vitamin C  500 mg Oral Daily  . Chlorhexidine Gluconate Cloth  6 each Topical Daily  . cholecalciferol  1,000 Units Oral Daily  . enoxaparin (LOVENOX) injection  40 mg Subcutaneous Q24H  . feeding supplement  237 mL Oral BID BM  . furosemide  40 mg Oral Daily  . levothyroxine  175 mcg Oral QAC breakfast  . mouth rinse  15 mL Mouth Rinse BID  . multivitamin with minerals  1 tablet Oral Daily  . pantoprazole  40 mg Oral Daily  . predniSONE  40 mg Oral Q breakfast  . sodium chloride flush  3 mL Intravenous Q12H  . traZODone  50 mg Oral QHS  . zinc sulfate  220 mg Oral Daily    Objective: Vital signs in last 24 hours: Temp:  [97.5 F (36.4 C)-98.1 F (36.7 C)] 97.7 F (36.5 C) (04/21 1225) Pulse Rate:  [79-98] 97 (04/21 1225) Resp:  [16-18] 17 (04/21 1225) BP: (112-129)/(61-74) 112/61 (04/21 1225) SpO2:  [92 %-98 %] 95 % (04/21 1225) Weight:  [81.8 kg] 81.8 kg (04/21 0444) Physical Exam  Constitutional: He  is oriented to person, place, and time. He appears well-developed and well-nourished. No distress.  HENT:  Mouth/Throat: Oropharynx is clear and moist. No oropharyngeal exudate.  Cardiovascular: Normal rate, regular rhythm and normal heart sounds.holosystolic 3/6 m Pulmonary/Chest: Effort normal and breath sounds normal. No respiratory distress. He has no wheezes.  Abdominal: Soft. Bowel sounds are normal. He exhibits no distension. There is no tenderness.  Ext: +clubbing and splinter hemorrhage. +1 pitting edema lower extremities bilaterally Neurological: He is alert and oriented to person, place, and time.  Skin: Skin is warm and dry. No rash noted. No erythema.  Psychiatric: He has a normal mood and affect. His behavior is normal.     Lab Results Recent Labs    03/13/21 0155 03/14/21 0158 03/15/21 0413  WBC 19.9* 12.2* 12.5*  HGB 11.8* 10.2* 10.7*  HCT 36.2* 31.9* 34.4*  NA 137  --  139  K 3.3*  --  3.8  CL 95*  --  103  CO2 32  --  31  BUN 25*  --  33*  CREATININE 1.01  --  0.96   Liver Panel Recent Labs    03/13/21 1915  PROT 6.2*  ALBUMIN 3.2*   C-Reactive Protein Recent Labs    03/14/21 0158 03/15/21 0413  CRP 1.8* 1.0*    Microbiology: 4/9  blood cx NGTD 4/8 blood cx enterococcus faecalis Studies/Results: ECHO TEE  Result Date: 03/14/2021    TRANSESOPHOGEAL ECHO REPORT   Patient Name:   Brandon [name] Date of Exam: 03/14/2021 Medical Rec #:  [id number]       Height:       73.0 in Accession #:    [id number]      Weight:       188.1 lb Date of Birth:  05-06-1951       BSA:          2.096 m Patient Age:    69 years        BP:           114/73 mmHg Patient Gender: M               HR:           92 bpm. Exam Location:  Inpatient Procedure: Transesophageal Echo, 3D Echo, Cardiac Doppler and Color Doppler Indications:     Endocarditis  History:         Patient has prior history of Echocardiogram examinations, most                  recent 03/04/2021. Arrythmias:Atrial  Fibrillation; Risk                  Factors:Dyslipidemia.  Sonographer:     Eulah Pont RDCS Referring Phys:  [id number] Wendall Stade Diagnosing Phys: Charlton Haws MD PROCEDURE: After discussion of the risks and benefits of a TEE, an informed consent was obtained from the patient. The transesophogeal probe was passed without difficulty through the esophogus of the patient. Sedation performed by different physician. The patient was monitored while under deep sedation. Anesthestetic sedation was provided intravenously by Anesthesiology: 228.99mg  of Propofol. The patient's vital signs; including heart rate, blood pressure, and oxygen saturation; remained stable throughout the procedure. The patient developed no complications during the procedure. IMPRESSIONS  1. Left ventricular ejection fraction, by estimation, is 55 to 60%. The left ventricle has normal function.  2. Right ventricular systolic function is normal. The right ventricular size is normal.  3. Left atrial size was mildly dilated. No left atrial/left atrial appendage thrombus was detected.  4. Flail A2 segment with perforation at the base of A2 and large vegetation 2.2 cm on ventricular side extending into papillary muscle. Intervalvular fibrosa intact Also appears to be small vegetation on P2. Findings discussed with CVTS Dr Dorris Fetch     Extensive 3D imaging of MV was performed . The mitral valve is abnormal. 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  5. The aortic valve is tricuspid. There is mild calcification of the aortic valve. Aortic valve regurgitation is trivial. Mild aortic valve sclerosis is present, with no evidence of aortic valve stenosis. FINDINGS  Left Ventricle: Left ventricular ejection fraction, by estimation, is 55 to 60%. The left ventricle has normal function. The left ventricular internal cavity size was normal in size. There is no left ventricular hypertrophy. Right Ventricle: The right ventricular size is normal. No increase in  right ventricular wall thickness. Right ventricular systolic function is normal. Left Atrium: Left atrial size was mildly dilated. No left atrial/left atrial appendage thrombus was detected. Right Atrium: Right atrial size was normal in size. Pericardium: There is no evidence of pericardial effusion. 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 Intervalvular fibrosa intact Also appears to  be small vegetation on P2. Findings discussed with CVTS Dr Dorris Fetch Extensive 3D imaging of MV was performed. The mitral valve is abnormal. 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 Tricuspid Valve: The tricuspid valve is normal in structure. Tricuspid valve regurgitation is mild. Aortic Valve: The aortic valve is tricuspid. There is mild calcification of the aortic valve. Aortic valve regurgitation is trivial. Mild aortic valve sclerosis is present, with no evidence of aortic valve stenosis. Pulmonic Valve: The pulmonic valve was normal in structure. Pulmonic valve regurgitation is mild. Aorta: The aortic root is normal in size and structure. IAS/Shunts: No atrial level shunt detected by color flow Doppler.  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 He would qualify to have valve replacement now given the findings for TEE. Also he is at risk for septic emboli as vegetation is so large, however he has significant airspace disease on CT, though he never required intubation. He remains on steroids and finished course of remdesivir. Only on 2L [place]. Discussion with heart failure team to see how he can get second opinion. Risk of  going onto ECMO is high/ higher mortality if doing surgery so soon since having covid illness.   [facility] Cell: [phone] Pager: [phone]  03/15/2021, 2:52 PM

## 2021-03-15 NOTE — Progress Notes (Addendum)
Advanced Heart Failure Rounding Note  PCP-Cardiologist: No primary care provider on file.   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 I do not think he would survive MVR under these circumstances. Reviewed CT images with Dr. Cornelius Moras and he agrees    Remains short of breath with exertion.    Objective:   Weight Range: 81.8 kg Body mass index is 23.79 kg/m.   Vital Signs:   Temp:  [97.5 F (36.4 C)-98.1 F (36.7 C)] 97.7 F (36.5 C) (04/21 1225) Pulse Rate:  [79-98] 97 (04/21 1225) Resp:  [16-20] 17 (04/21 1225) BP: (97-129)/(61-74) 112/61 (04/21 1225) SpO2:  [92 %-98 %] 95 % (04/21 1225) Weight:  [81.8 kg] 81.8 kg (04/21 0444) Last BM Date: 03/14/21  Weight change: Filed Weights   03/03/21 1835 03/14/21 1258 03/15/21 0444  Weight: 85.3 kg 85.3 kg 81.8 kg    Intake/Output:   Intake/Output Summary (Last 24 hours) at 03/15/2021 1307 Last data filed at 03/15/2021 1150 Gross per 24 hour  Intake 3987.46 ml  Output 1500 ml  Net 2487.46 ml      Physical Exam  Physical exam per Dr Gala Romney.  General:  Well appearing. No resp difficulty HEENT: normal Neck: supple. no JVD. Carotids 2+ bilat; no bruits. No lymphadenopathy or thryomegaly appreciated. Cor: PMI nondisplaced. Regular rate & rhythm. No rubs, gallops or murmurs. Lungs: clear Abdomen: soft, nontender, nondistended. No hepatosplenomegaly. No bruits or masses. Good bowel sounds. Extremities: no cyanosis, clubbing, rash, edema Neuro: alert & orientedx3, cranial nerves grossly intact. moves all 4 extremities w/o difficulty. Affect pleasant    Telemetry  SR 80-90s  EKG    N/A  Labs    CBC Recent Labs    03/14/21 0158 03/15/21 0413  WBC 12.2* 12.5*  NEUTROABS 10.9* 10.1*  HGB 10.2* 10.7*  HCT 31.9* 34.4*  MCV 95.5 97.5   PLT 387 361   Basic Metabolic Panel Recent Labs    22/63/33 0155 03/14/21 0158 03/15/21 0413  NA 137  --  139  K 3.3*  --  3.8  CL 95*  --  103  CO2 32  --  31  GLUCOSE 126*  --  100*  BUN 25*  --  33*  CREATININE 1.01  --  0.96  CALCIUM 9.1  --  8.4*  MG 2.0 2.2 2.2  PHOS 3.0 3.0 2.7   Liver Function Tests Recent Labs    03/13/21 1915  PROT 6.2*  ALBUMIN 3.2*   No results for input(s): LIPASE, AMYLASE in the last 72 hours. Cardiac Enzymes No results for input(s): CKTOTAL, CKMB, CKMBINDEX, TROPONINI in the last 72 hours.  BNP: BNP (last 3 results) Recent Labs    03/06/21 0955 03/07/21 0251 03/13/21 0155  BNP 142.6* 162.8* 331.0*    ProBNP (last 3 results) No results for input(s): PROBNP in the last 8760 hours.   D-Dimer Recent Labs    03/14/21 0158 03/15/21 0413  DDIMER 1.70* 1.64*   Hemoglobin A1C No results for input(s): HGBA1C in the last 72 hours. Fasting Lipid Panel No results for input(s): CHOL, HDL, LDLCALC, TRIG, CHOLHDL, LDLDIRECT in the last 72 hours. Thyroid Function Tests No results for input(s): TSH, T4TOTAL, T3FREE, THYROIDAB in the last 72 hours.  Invalid input(s): FREET3  Other results:   Imaging    ECHO TEE  Result Date: 03/14/2021    TRANSESOPHOGEAL ECHO REPORT   Patient Name:   Brandon [name] Date of Exam: 03/14/2021 Medical Rec #:  [id number]       Height:       73.0 in Accession #:    [id number]      Weight:       188.1 lb Date of Birth:  Aug 08, 1951       BSA:          2.096 m Patient Age:    69 years        BP:           114/73 mmHg Patient Gender: M               HR:           92 bpm. Exam Location:  Inpatient Procedure: Transesophageal Echo, 3D Echo, Cardiac Doppler and Color Doppler Indications:     Endocarditis  History:         Patient has prior history of Echocardiogram examinations, most                  recent 03/04/2021. Arrythmias:Atrial Fibrillation; Risk                  Factors:Dyslipidemia.  Sonographer:     Eulah Pont RDCS Referring Phys:  [id number] Wendall Stade Diagnosing Phys: Charlton Haws MD PROCEDURE: After discussion of the risks and benefits of a TEE, an informed consent was obtained from the patient. The transesophogeal probe was passed without difficulty through the esophogus of the patient. Sedation performed by different physician. The patient was monitored while under deep sedation. Anesthestetic sedation was provided intravenously by Anesthesiology: 228.99mg  of Propofol. The patient's vital signs; including heart rate, blood pressure, and oxygen saturation; remained stable throughout the procedure. The patient developed no complications during the procedure. IMPRESSIONS  1. Left ventricular ejection fraction, by estimation, is 55 to 60%. The left ventricle has normal function.  2. Right ventricular systolic function is normal. The right ventricular size is normal.  3. Left atrial size was mildly dilated. No left atrial/left atrial appendage thrombus was detected.  4. Flail A2 segment with perforation at the base of A2 and large vegetation 2.2 cm on ventricular side extending into papillary muscle. Intervalvular fibrosa intact Also appears to be small vegetation on P2. Findings discussed with CVTS Dr Dorris Fetch     Extensive 3D imaging of MV was performed . The mitral valve is abnormal. 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  5. The aortic valve is tricuspid. There is mild calcification of the aortic valve. Aortic valve regurgitation is trivial. Mild aortic valve sclerosis is present, with no evidence of aortic valve stenosis. FINDINGS  Left Ventricle: Left ventricular ejection fraction, by estimation, is 55 to 60%. The left ventricle has normal function. The left ventricular internal cavity size was normal in size. There is no left ventricular hypertrophy. Right Ventricle: The right ventricular size is normal. No increase in right ventricular wall thickness. Right ventricular systolic function is normal.  Left Atrium: Left atrial size was mildly dilated. No left atrial/left atrial appendage thrombus was detected. Right Atrium: Right atrial size was normal in size. Pericardium: There is no evidence of pericardial effusion. 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 Intervalvular fibrosa intact Also appears to be small vegetation on P2. Findings discussed with CVTS Dr Dorris Fetch Extensive  3D imaging of MV was performed. The mitral valve is abnormal. 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 Tricuspid Valve: The tricuspid valve is normal in structure. Tricuspid valve regurgitation is mild. Aortic Valve: The aortic valve is tricuspid. There is mild calcification of the aortic valve. Aortic valve regurgitation is trivial. Mild aortic valve sclerosis is present, with no evidence of aortic valve stenosis. Pulmonic Valve: The pulmonic valve was normal in structure. Pulmonic valve regurgitation is mild. Aorta: The aortic root is normal in size and structure. IAS/Shunts: No atrial level shunt detected by color flow Doppler.  MR PISA:        16.08 cm MR PISA Radius: 1.60 cm Charlton HawsPeter Nishan MD Electronically signed by Charlton HawsPeter Nishan MD Signature Date/Time: 03/14/2021/2:36:40 PM    Final       Medications:     Scheduled Medications: . vitamin C  500 mg Oral Daily  . Chlorhexidine Gluconate Cloth  6 each Topical Daily  . cholecalciferol  1,000 Units Oral Daily  . enoxaparin (LOVENOX) injection  40 mg Subcutaneous Q24H  . feeding supplement  237 mL Oral BID BM  . furosemide  40 mg Oral Daily  . levothyroxine  175 mcg Oral QAC breakfast  . mouth rinse  15 mL Mouth Rinse BID  . multivitamin with minerals  1 tablet Oral Daily  . pantoprazole  40 mg Oral Daily  . predniSONE  40 mg Oral Q breakfast  . traZODone  50 mg Oral QHS  . zinc sulfate  220 mg Oral Daily     Infusions: . sodium chloride Stopped (03/14/21  1900)  . sodium chloride 5 mL/hr at 03/15/21 0555  . ampicillin (OMNIPEN) IV 2 g (03/15/21 0955)  . cefTRIAXone (ROCEPHIN)  IV 2 g (03/15/21 0850)     PRN Medications:  sodium chloride, sodium chloride, acetaminophen **OR** acetaminophen, albuterol, chlorpheniramine-HYDROcodone, fluticasone, guaiFENesin-dextromethorphan, hydrALAZINE, lidocaine (PF), sodium chloride flush    Assessment/Plan  1. 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 Intervalvular fibrosa intact. Also appears to be small vegetation on P2. Torrential MVR.  - CT surgery following. No plan for surgery at this time due to PNA with severe air space disease.   2. Acute COVID 19 Infection w/ COVID PNA - abx per above - on prednisone. Received Remdesivir   He discussed with Dr Gala RomneyBensimhon and would like another opinion at [facility].   Length of Stay: 12  Tonye BecketAmy Clegg, NP  03/15/2021, 1:07 PM  Advanced Heart Failure Team Pager [phone] (M-F; 7a - 5p)  Please contact [facility] for night-coverage after hours (5p -7a ) and weekends on [email or web address]  Patient seen and examined with the above-signed Advanced Practice Provider and/or Housestaff. I personally reviewed laboratory data, imaging studies and relevant notes. I independently examined the patient and formulated the important aspects of the plan. I have edited the note to reflect any of my changes or salient points. I have personally discussed the plan with the patient and/or family.  Remains SOB with any exertions. Oxygen requirements improved. On IV abx. No fevers or chills. Oral lasix started yesterday.   General:  Sitting up in chair No resp difficulty HEENT: normal Neck: supple. no JVD. Carotids 2+ bilat; no bruits. No lymphadenopathy or thryomegaly  appreciated. Cor: PMI nondisplaced. Regular rate & rhythm.  3/6 MR Lungs: clear Abdomen: soft, nontender, nondistended. No hepatosplenomegaly. No bruits or masses. Good bowel sounds. Extremities: no cyanosis, clubbing, rash, edema Neuro: alert & orientedx3, cranial nerves grossly intact. moves all 4 extremities w/o difficulty. Affect pleasant  Long discussion with him about situation. He has discussed with his family and they would like to have him transferred to [facility] for further evaluation and eventual MV surgery.   I have spoken to Dr. Tretha Sciara [facility]) at [facility] who has accepted him in transfer when bed available. Will plan R/L cath here tomorrow. Also repeat CXR. Continue IV abx.   Will need eventual MVR once pulmonary situation improves.   Total time spent 40 minutes. Over half that time spent discussing above.   Arvilla Meres, MD  10:44 PM

## 2021-03-16 ENCOUNTER — Encounter (HOSPITAL_COMMUNITY)
Admission: EM | Disposition: A | Discharge status: Other Acute Inpatient Hospital | Payer: Self-pay | Source: Home / Self Care | Attending: Internal Medicine

## 2021-03-16 ENCOUNTER — Inpatient Hospital Stay (HOSPITAL_COMMUNITY): Payer: Medicare Other

## 2021-03-16 ENCOUNTER — Encounter (HOSPITAL_COMMUNITY): Payer: Self-pay | Admitting: Internal Medicine

## 2021-03-16 DIAGNOSIS — I059 Rheumatic mitral valve disease, unspecified: Secondary | ICD-10-CM | POA: Diagnosis not present

## 2021-03-16 DIAGNOSIS — I340 Nonrheumatic mitral (valve) insufficiency: Secondary | ICD-10-CM | POA: Diagnosis not present

## 2021-03-16 DIAGNOSIS — R7881 Bacteremia: Secondary | ICD-10-CM | POA: Diagnosis not present

## 2021-03-16 DIAGNOSIS — I2510 Atherosclerotic heart disease of native coronary artery without angina pectoris: Secondary | ICD-10-CM

## 2021-03-16 DIAGNOSIS — U071 COVID-19: Secondary | ICD-10-CM | POA: Diagnosis not present

## 2021-03-16 HISTORY — PX: RIGHT/LEFT HEART CATH AND CORONARY ANGIOGRAPHY: CATH118266

## 2021-03-16 LAB — POCT I-STAT EG7
Acid-Base Excess: 2 mmol/L (ref 0.0–2.0)
Acid-Base Excess: 3 mmol/L — ABNORMAL HIGH (ref 0.0–2.0)
Bicarbonate: 26.9 mmol/L (ref 20.0–28.0)
Bicarbonate: 27.3 mmol/L (ref 20.0–28.0)
Calcium, Ion: 1.14 mmol/L — ABNORMAL LOW (ref 1.15–1.40)
Calcium, Ion: 1.19 mmol/L (ref 1.15–1.40)
HCT: 32 % — ABNORMAL LOW (ref 39.0–52.0)
HCT: 32 % — ABNORMAL LOW (ref 39.0–52.0)
Hemoglobin: 10.9 g/dL — ABNORMAL LOW (ref 13.0–17.0)
Hemoglobin: 10.9 g/dL — ABNORMAL LOW (ref 13.0–17.0)
O2 Saturation: 68 %
O2 Saturation: 69 %
Potassium: 3.6 mmol/L (ref 3.5–5.1)
Potassium: 3.6 mmol/L (ref 3.5–5.1)
Sodium: 136 mmol/L (ref 135–145)
Sodium: 138 mmol/L (ref 135–145)
TCO2: 28 mmol/L (ref 22–32)
TCO2: 28 mmol/L (ref 22–32)
pCO2, Ven: 40.3 mmHg — ABNORMAL LOW (ref 44.0–60.0)
pCO2, Ven: 40.5 mmHg — ABNORMAL LOW (ref 44.0–60.0)
pH, Ven: 7.432 — ABNORMAL HIGH (ref 7.250–7.430)
pH, Ven: 7.436 — ABNORMAL HIGH (ref 7.250–7.430)
pO2, Ven: 34 mmHg (ref 32.0–45.0)
pO2, Ven: 35 mmHg (ref 32.0–45.0)

## 2021-03-16 LAB — POCT I-STAT 7, (LYTES, BLD GAS, ICA,H+H)
Acid-Base Excess: 1 mmol/L (ref 0.0–2.0)
Acid-Base Excess: 3 mmol/L — ABNORMAL HIGH (ref 0.0–2.0)
Bicarbonate: 24.5 mmol/L (ref 20.0–28.0)
Bicarbonate: 26.1 mmol/L (ref 20.0–28.0)
Calcium, Ion: 1.07 mmol/L — ABNORMAL LOW (ref 1.15–1.40)
Calcium, Ion: 1.07 mmol/L — ABNORMAL LOW (ref 1.15–1.40)
HCT: 30 % — ABNORMAL LOW (ref 39.0–52.0)
HCT: 32 % — ABNORMAL LOW (ref 39.0–52.0)
Hemoglobin: 10.2 g/dL — ABNORMAL LOW (ref 13.0–17.0)
Hemoglobin: 10.9 g/dL — ABNORMAL LOW (ref 13.0–17.0)
O2 Saturation: 97 %
O2 Saturation: 97 %
Potassium: 3.4 mmol/L — ABNORMAL LOW (ref 3.5–5.1)
Potassium: 3.4 mmol/L — ABNORMAL LOW (ref 3.5–5.1)
Sodium: 137 mmol/L (ref 135–145)
Sodium: 138 mmol/L (ref 135–145)
TCO2: 26 mmol/L (ref 22–32)
TCO2: 27 mmol/L (ref 22–32)
pCO2 arterial: 34.8 mmHg (ref 32.0–48.0)
pCO2 arterial: 35.4 mmHg (ref 32.0–48.0)
pH, Arterial: 7.455 — ABNORMAL HIGH (ref 7.350–7.450)
pH, Arterial: 7.477 — ABNORMAL HIGH (ref 7.350–7.450)
pO2, Arterial: 87 mmHg (ref 83.0–108.0)
pO2, Arterial: 88 mmHg (ref 83.0–108.0)

## 2021-03-16 LAB — LACTATE DEHYDROGENASE: LDH: 154 U/L (ref 98–192)

## 2021-03-16 LAB — CBC WITH DIFFERENTIAL/PLATELET
Abs Immature Granulocytes: 0.12 10*3/uL — ABNORMAL HIGH (ref 0.00–0.07)
Basophils Absolute: 0 10*3/uL (ref 0.0–0.1)
Basophils Relative: 0 %
Eosinophils Absolute: 0.1 10*3/uL (ref 0.0–0.5)
Eosinophils Relative: 1 %
HCT: 36.3 % — ABNORMAL LOW (ref 39.0–52.0)
Hemoglobin: 11.3 g/dL — ABNORMAL LOW (ref 13.0–17.0)
Immature Granulocytes: 1 %
Lymphocytes Relative: 15 %
Lymphs Abs: 1.5 10*3/uL (ref 0.7–4.0)
MCH: 30.2 pg (ref 26.0–34.0)
MCHC: 31.1 g/dL (ref 30.0–36.0)
MCV: 97.1 fL (ref 80.0–100.0)
Monocytes Absolute: 0.5 10*3/uL (ref 0.1–1.0)
Monocytes Relative: 5 %
Neutro Abs: 7.4 10*3/uL (ref 1.7–7.7)
Neutrophils Relative %: 78 %
Platelets: 305 10*3/uL (ref 150–400)
RBC: 3.74 MIL/uL — ABNORMAL LOW (ref 4.22–5.81)
RDW: 15 % (ref 11.5–15.5)
WBC: 9.6 10*3/uL (ref 4.0–10.5)
nRBC: 0 % (ref 0.0–0.2)

## 2021-03-16 LAB — MAGNESIUM: Magnesium: 2 mg/dL (ref 1.7–2.4)

## 2021-03-16 LAB — C-REACTIVE PROTEIN: CRP: 3.9 mg/dL — ABNORMAL HIGH (ref <1.0)

## 2021-03-16 LAB — FERRITIN: Ferritin: 448 ng/mL — ABNORMAL HIGH (ref 24–336)

## 2021-03-16 LAB — PHOSPHORUS: Phosphorus: 2.1 mg/dL — ABNORMAL LOW (ref 2.5–4.6)

## 2021-03-16 LAB — D-DIMER, QUANTITATIVE: D-Dimer, Quant: 2.45 ug/mL-FEU — ABNORMAL HIGH (ref 0.00–0.50)

## 2021-03-16 SURGERY — RIGHT/LEFT HEART CATH AND CORONARY ANGIOGRAPHY
Anesthesia: LOCAL

## 2021-03-16 MED ORDER — IOHEXOL 350 MG/ML SOLN
INTRAVENOUS | Status: DC | PRN
  Administered 2021-03-16: 50 mL

## 2021-03-16 MED ORDER — METHYLPREDNISOLONE SODIUM SUCC 125 MG IJ SOLR
80.0000 mg | Freq: Once | INTRAMUSCULAR | Status: AC
  Administered 2021-03-16: 80 mg via INTRAVENOUS
  Filled 2021-03-16: qty 2

## 2021-03-16 MED ORDER — HEPARIN (PORCINE) IN NACL 1000-0.9 UT/500ML-% IV SOLN
INTRAVENOUS | Status: DC | PRN
  Administered 2021-03-16 (×2): 500 mL

## 2021-03-16 MED ORDER — MIDAZOLAM HCL 2 MG/2ML IJ SOLN
INTRAMUSCULAR | Status: AC
  Filled 2021-03-16: qty 2

## 2021-03-16 MED ORDER — ASPIRIN 81 MG PO CHEW
81.0000 mg | CHEWABLE_TABLET | ORAL | Status: AC
  Administered 2021-03-16: 81 mg via ORAL
  Filled 2021-03-16: qty 1

## 2021-03-16 MED ORDER — SODIUM CHLORIDE 0.9 % IV SOLN: 250.0000 mL | INTRAVENOUS | Status: DC | PRN

## 2021-03-16 MED ORDER — HEPARIN SODIUM (PORCINE) 1000 UNIT/ML IJ SOLN
INTRAMUSCULAR | Status: DC | PRN
  Administered 2021-03-16: 4000 [IU] via INTRAVENOUS

## 2021-03-16 MED ORDER — LABETALOL HCL 5 MG/ML IV SOLN: 10.0000 mg | INTRAVENOUS | Status: AC | PRN

## 2021-03-16 MED ORDER — ACETAMINOPHEN 325 MG PO TABS: 650.0000 mg | ORAL_TABLET | ORAL | Status: DC | PRN

## 2021-03-16 MED ORDER — SODIUM CHLORIDE 0.9 % IV SOLN: INTRAVENOUS | Status: DC

## 2021-03-16 MED ORDER — HEPARIN (PORCINE) IN NACL 1000-0.9 UT/500ML-% IV SOLN
INTRAVENOUS | Status: AC
  Filled 2021-03-16: qty 500

## 2021-03-16 MED ORDER — SODIUM CHLORIDE 0.9% FLUSH: 3.0000 mL | INTRAVENOUS | Status: DC | PRN

## 2021-03-16 MED ORDER — MIDAZOLAM HCL 2 MG/2ML IJ SOLN
INTRAMUSCULAR | Status: DC | PRN
  Administered 2021-03-16: 1 mg via INTRAVENOUS

## 2021-03-16 MED ORDER — FENTANYL CITRATE (PF) 100 MCG/2ML IJ SOLN
INTRAMUSCULAR | Status: AC
  Filled 2021-03-16: qty 2

## 2021-03-16 MED ORDER — VERAPAMIL HCL 2.5 MG/ML IV SOLN
INTRAVENOUS | Status: AC
  Filled 2021-03-16: qty 2

## 2021-03-16 MED ORDER — SODIUM CHLORIDE 0.9% FLUSH: 3.0000 mL | Freq: Two times a day (BID) | INTRAVENOUS | Status: DC

## 2021-03-16 MED ORDER — ONDANSETRON HCL 4 MG/2ML IJ SOLN: 4.0000 mg | Freq: Four times a day (QID) | INTRAMUSCULAR | Status: DC | PRN

## 2021-03-16 MED ORDER — HEPARIN (PORCINE) IN NACL 2-0.9 UNITS/ML
INTRAMUSCULAR | Status: DC | PRN
  Administered 2021-03-16: 10 mL via INTRA_ARTERIAL

## 2021-03-16 MED ORDER — ENOXAPARIN SODIUM 40 MG/0.4ML ~~LOC~~ SOLN: 40.0000 mg | SUBCUTANEOUS | Status: DC

## 2021-03-16 MED ORDER — HEPARIN SODIUM (PORCINE) 1000 UNIT/ML IJ SOLN
INTRAMUSCULAR | Status: AC
  Filled 2021-03-16: qty 1

## 2021-03-16 MED ORDER — LIDOCAINE HCL (PF) 1 % IJ SOLN
INTRAMUSCULAR | Status: DC | PRN
  Administered 2021-03-16: 3 mL

## 2021-03-16 MED ORDER — FAMOTIDINE IN NACL 20-0.9 MG/50ML-% IV SOLN
20.0000 mg | Freq: Once | INTRAVENOUS | Status: AC
  Administered 2021-03-16: 20 mg via INTRAVENOUS
  Filled 2021-03-16: qty 50

## 2021-03-16 MED ORDER — DIPHENHYDRAMINE HCL 50 MG/ML IJ SOLN
50.0000 mg | Freq: Once | INTRAMUSCULAR | Status: AC
  Administered 2021-03-16: 50 mg via INTRAVENOUS
  Filled 2021-03-16: qty 1

## 2021-03-16 MED ORDER — LIDOCAINE HCL (PF) 1 % IJ SOLN
INTRAMUSCULAR | Status: AC
  Filled 2021-03-16: qty 30

## 2021-03-16 MED ORDER — HYDRALAZINE HCL 20 MG/ML IJ SOLN: 10.0000 mg | INTRAMUSCULAR | Status: AC | PRN

## 2021-03-16 SURGICAL SUPPLY — 12 items
CATH 5FR JL3.5 JR4 ANG PIG MP (CATHETERS) ×2 IMPLANT
CATH SWAN GANZ 7F STRAIGHT (CATHETERS) ×2 IMPLANT
DEVICE RAD COMP TR BAND LRG (VASCULAR PRODUCTS) ×2 IMPLANT
GLIDESHEATH SLEND SS 6F .021 (SHEATH) ×2 IMPLANT
GLIDESHEATH SLENDER 7FR .021G (SHEATH) ×2 IMPLANT
GUIDEWIRE INQWIRE 1.5J.035X260 (WIRE) ×1 IMPLANT
INQWIRE 1.5J .035X260CM (WIRE) ×2
KIT HEART LEFT (KITS) ×2 IMPLANT
KIT MICROPUNCTURE NIT STIFF (SHEATH) ×2 IMPLANT
PACK CARDIAC CATHETERIZATION (CUSTOM PROCEDURE TRAY) ×2 IMPLANT
SHEATH PROBE COVER 6X72 (BAG) ×2 IMPLANT
TRANSDUCER W/STOPCOCK (MISCELLANEOUS) ×2 IMPLANT

## 2021-03-16 NOTE — Progress Notes (Signed)
Advanced Heart Failure Rounding Note  PCP-Cardiologist: No primary care provider on file.   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 I do not think he would survive MVR under these circumstances. Reviewed CT images with Dr. Cornelius Moras and he agrees    Remains short of breath with exertion. No orthopnea or PND. Afebrile. Continues with IV abx. Weight down 1 pound on oral lasix. SCr stable.   Objective:   Weight Range: 81.5 kg Body mass index is 23.71 kg/m.   Vital Signs:   Temp:  [97.7 F (36.5 C)-98.2 F (36.8 C)] 98 F (36.7 C) (04/22 0742) Pulse Rate:  [91-97] 97 (04/22 0742) Resp:  [16-18] 16 (04/22 0742) BP: (112-128)/(61-77) 127/77 (04/22 0742) SpO2:  [88 %-96 %] 95 % (04/22 0742) Weight:  [81.5 kg] 81.5 kg (04/22 0405) Last BM Date: 03/15/21 (per patient)  Weight change: Filed Weights   03/15/21 0444 03/16/21 0110 03/16/21 0405  Weight: 81.8 kg 81.5 kg 81.5 kg    Intake/Output:   Intake/Output Summary (Last 24 hours) at 03/16/2021 0937 Last data filed at 03/16/2021 0500 Gross per 24 hour  Intake 857.44 ml  Output 1175 ml  Net -317.56 ml      Physical Exam   General:  Sitting up in chair. On O2 No resp difficulty HEENT: normal Neck: supple. no JVD. Carotids 2+ bilat; no bruits. No lymphadenopathy or thryomegaly appreciated. Cor: PMI nondisplaced. Regular rate & rhythm. 3/6 MR Lungs: clear Abdomen: soft, nontender, nondistended. No hepatosplenomegaly. No bruits or masses. Good bowel sounds. Extremities: no cyanosis, clubbing, rash, edema Neuro: alert & orientedx3, cranial nerves grossly intact. moves all 4 extremities w/o difficulty. Affect pleasant   Telemetry   Sinus 90s Personally reviewed  Labs    CBC Recent Labs    03/15/21 0413 03/16/21 0436  WBC 12.5* 9.6   NEUTROABS 10.1* 7.4  HGB 10.7* 11.3*  HCT 34.4* 36.3*  MCV 97.5 97.1  PLT 361 305   Basic Metabolic Panel Recent Labs    57/90/38 0413 03/16/21 0436  NA 139  --   K 3.8  --   CL 103  --   CO2 31  --   GLUCOSE 100*  --   BUN 33*  --   CREATININE 0.96  --   CALCIUM 8.4*  --   MG 2.2 2.0  PHOS 2.7 2.1*   Liver Function Tests Recent Labs    03/13/21 1915  PROT 6.2*  ALBUMIN 3.2*   No results for input(s): LIPASE, AMYLASE in the last 72 hours. Cardiac Enzymes No results for input(s): CKTOTAL, CKMB, CKMBINDEX, TROPONINI in the last 72 hours.  BNP: BNP (last 3 results) Recent Labs    03/06/21 0955 03/07/21 0251 03/13/21 0155  BNP 142.6* 162.8* 331.0*    ProBNP (last 3 results) No results for input(s): PROBNP in the last 8760 hours.   D-Dimer Recent Labs    03/15/21 0413 03/16/21 0436  DDIMER 1.64* 2.45*   Hemoglobin A1C No results for input(s): HGBA1C in the last 72 hours. Fasting Lipid Panel No results for input(s): CHOL, HDL, LDLCALC, TRIG, CHOLHDL, LDLDIRECT in the last 72 hours. Thyroid Function Tests No results for input(s): TSH, T4TOTAL, T3FREE, THYROIDAB in the last 72 hours.  Invalid input(s): FREET3  Other results:   Imaging    No results found.  Medications:     Scheduled Medications: . vitamin C  500 mg Oral Daily  . Chlorhexidine Gluconate Cloth  6 each Topical Daily  . cholecalciferol  1,000 Units Oral Daily  . diphenhydrAMINE  50 mg Intravenous Once  . enoxaparin (LOVENOX) injection  40 mg Subcutaneous Q24H  . feeding supplement  237 mL Oral BID BM  . furosemide  40 mg Oral Daily  . levothyroxine  175 mcg Oral QAC breakfast  . mouth rinse  15 mL Mouth Rinse BID  . multivitamin with minerals  1 tablet Oral Daily  . pantoprazole  40 mg Oral Daily  . predniSONE  30 mg Oral Q breakfast  . sodium chloride flush  3 mL Intravenous Q12H  . traZODone  50 mg Oral QHS  . zinc sulfate  220 mg Oral Daily    Infusions: . sodium  chloride Stopped (03/14/21 1900)  . sodium chloride 5 mL/hr at 03/15/21 0555  . sodium chloride    . sodium chloride    . ampicillin (OMNIPEN) IV 2 g (03/16/21 0920)  . cefTRIAXone (ROCEPHIN)  IV Stopped (03/15/21 2245)  . famotidine      PRN Medications: sodium chloride, sodium chloride, sodium chloride, acetaminophen **OR** acetaminophen, albuterol, chlorpheniramine-HYDROcodone, fluticasone, guaiFENesin-dextromethorphan, hydrALAZINE, lidocaine (PF), sodium chloride flush, sodium chloride flush    Assessment/Plan   1. 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 Intervalvular fibrosa intact. Also appears to be small vegetation on P2. 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  - CT surgery following. No plan for surgery at this time due to PNA with severe air space disease.  - Patient and family desire transfer to [facility]  I have spoken to Dr. Tretha Sciara [facility]) at [facility] who has accepted him in transfer when bed available. Will plan R/L cath here today.  Will repeat CXR. Continue IV abx and daily lasix. Can start gentle afterload reduction after cath but given severity of MR and structural damage to valve not sure how much this will actually help.  Will need eventual MVR once pulmonary situation improves.    2. Acute COVID 19 Infection w/ COVID PNA - abx per above - on prednisone. Received Remdesivir   Length of Stay: 13  Arvilla Meres, MD  03/16/2021, 9:37 AM  Advanced Heart Failure Team Pager [phone] (M-F; 7a - 5p)  Please contact [facility] for night-coverage after hours (5p -7a ) and weekends on [email or web address]

## 2021-03-16 NOTE — Progress Notes (Signed)
Cath lab here to get pt. All pre cath meds given to pt.

## 2021-03-16 NOTE — Progress Notes (Signed)
Physical Therapy Treatment Patient Details Name: Brandon [name] MRN: [id number] DOB: September 13, 1951 Today's Date: 03/16/2021    History of Present Illness 70yo male who presented to the ED on 03/02/21 with possible RUL PNA, declined admission and was discharged from the ED. Blood cultures were then found to be positive for enterococcus and he was asked to return to the hospital, admitted 4/9 and also found to be Covid positive on 4/9. PMH Meneire's disease, HLD, PTX, A-fib, L clavicle fracture s/p ORIF    PT Comments    Pt doing well with mobility and no further PT needed.  Pt to transfer to [facility]. Instructed pt to continue with repeated sit to stand exercises and to amb in the hallway daily.    Follow Up Recommendations  No PT follow up     Equipment Recommendations  None recommended by PT    Recommendations for Other Services       Precautions / Restrictions Precautions Precautions: None    Mobility  Bed Mobility Overal bed mobility: Independent                  Transfers Overall transfer level: Independent Equipment used: None                Ambulation/Gait Ambulation/Gait assistance: Independent Social research officer, government (Feet): 350 Feet Assistive device: IV Pole Gait Pattern/deviations: WFL(Within Functional Limits) Gait velocity: adequate Gait velocity interpretation: >2.62 ft/sec, indicative of community ambulatory General Gait Details: steady gait   Stairs             Wheelchair Mobility    Modified Rankin (Stroke Patients Only)       Balance Overall balance assessment: No apparent balance deficits (not formally assessed)                                          Cognition Arousal/Alertness: Awake/alert Behavior During Therapy: WFL for tasks assessed/performed Overall Cognitive Status: Within Functional Limits for tasks assessed                                        Exercises Other Exercises Other  Exercises: repeated sit to stand without use of hands x 5    General Comments General comments (skin integrity, edema, etc.): amb on RA with SpO2 90% or greater      Pertinent Vitals/Pain      Home Living                      Prior Function            PT Goals (current goals can now be found in the care plan section) Acute Rehab PT Goals Patient Stated Goal: go home when better, return to normal activities Progress towards PT goals: Goals met/education completed, patient discharged from PT    Frequency           PT Plan Other (comment) (DC from PT)    Co-evaluation              AM-PAC PT "6 Clicks" Mobility   Outcome Measure  Help needed turning from your back to your side while in a flat bed without using bedrails?: None Help needed moving from lying on your back to sitting on the side of a flat bed without  using bedrails?: None Help needed moving to and from a bed to a chair (including a wheelchair)?: None Help needed standing up from a chair using your arms (e.g., wheelchair or bedside chair)?: None Help needed to walk in hospital room?: None Help needed climbing 3-5 steps with a railing? : None 6 Click Score: 24    End of Session   Activity Tolerance: Patient tolerated treatment well Patient left: in bed;with call bell/phone within reach   PT Visit Diagnosis: Muscle weakness (generalized) (M62.81)     Time: 0221-7981 PT Time Calculation (min) (ACUTE ONLY): 16 min  Charges:  $Gait Training: 8-22 mins                     [place] Pager [phone] [place] 03/16/2021, 8:58 AM

## 2021-03-16 NOTE — H&P (View-Only) (Signed)
  Advanced Heart Failure Rounding Note  PCP-Cardiologist: No primary care provider on file.   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 I do not think he would survive MVR under these circumstances. Reviewed CT images with Dr. Owen and he agrees    Remains short of breath with exertion. No orthopnea or PND. Afebrile. Continues with IV abx. Weight down 1 pound on oral lasix. SCr stable.   Objective:   Weight Range: 81.5 kg Body mass index is 23.71 kg/m.   Vital Signs:   Temp:  [97.7 F (36.5 C)-98.2 F (36.8 C)] 98 F (36.7 C) (04/22 0742) Pulse Rate:  [91-97] 97 (04/22 0742) Resp:  [16-18] 16 (04/22 0742) BP: (112-128)/(61-77) 127/77 (04/22 0742) SpO2:  [88 %-96 %] 95 % (04/22 0742) Weight:  [81.5 kg] 81.5 kg (04/22 0405) Last BM Date: 03/15/21 (per patient)  Weight change: Filed Weights   03/15/21 0444 03/16/21 0110 03/16/21 0405  Weight: 81.8 kg 81.5 kg 81.5 kg    Intake/Output:   Intake/Output Summary (Last 24 hours) at 03/16/2021 0937 Last data filed at 03/16/2021 0500 Gross per 24 hour  Intake 857.44 ml  Output 1175 ml  Net -317.56 ml      Physical Exam   General:  Sitting up in chair. On O2 No resp difficulty HEENT: normal Neck: supple. no JVD. Carotids 2+ bilat; no bruits. No lymphadenopathy or thryomegaly appreciated. Cor: PMI nondisplaced. Regular rate & rhythm. 3/6 MR Lungs: clear Abdomen: soft, nontender, nondistended. No hepatosplenomegaly. No bruits or masses. Good bowel sounds. Extremities: no cyanosis, clubbing, rash, edema Neuro: alert & orientedx3, cranial nerves grossly intact. moves all 4 extremities w/o difficulty. Affect pleasant   Telemetry   Sinus 90s Personally reviewed  Labs    CBC Recent Labs    03/15/21 0413 03/16/21 0436  WBC 12.5* 9.6   NEUTROABS 10.1* 7.4  HGB 10.7* 11.3*  HCT 34.4* 36.3*  MCV 97.5 97.1  PLT 361 305   Basic Metabolic Panel Recent Labs    03/15/21 0413 03/16/21 0436  NA 139  --   K 3.8  --   CL 103  --   CO2 31  --   GLUCOSE 100*  --   BUN 33*  --   CREATININE 0.96  --   CALCIUM 8.4*  --   MG 2.2 2.0  PHOS 2.7 2.1*   Liver Function Tests Recent Labs    03/13/21 1915  PROT 6.2*  ALBUMIN 3.2*   No results for input(s): LIPASE, AMYLASE in the last 72 hours. Cardiac Enzymes No results for input(s): CKTOTAL, CKMB, CKMBINDEX, TROPONINI in the last 72 hours.  BNP: BNP (last 3 results) Recent Labs    03/06/21 0955 03/07/21 0251 03/13/21 0155  BNP 142.6* 162.8* 331.0*    ProBNP (last 3 results) No results for input(s): PROBNP in the last 8760 hours.   D-Dimer Recent Labs    03/15/21 0413 03/16/21 0436  DDIMER 1.64* 2.45*   Hemoglobin A1C No results for input(s): HGBA1C in the last 72 hours. Fasting Lipid Panel No results for input(s): CHOL, HDL, LDLCALC, TRIG, CHOLHDL, LDLDIRECT in the last 72 hours. Thyroid Function Tests No results for input(s): TSH, T4TOTAL, T3FREE, THYROIDAB in the last 72 hours.  Invalid input(s): FREET3  Other results:   Imaging    No results found.     Medications:     Scheduled Medications: . vitamin C  500 mg Oral Daily  . Chlorhexidine Gluconate Cloth  6 each Topical Daily  . cholecalciferol  1,000 Units Oral Daily  . diphenhydrAMINE  50 mg Intravenous Once  . enoxaparin (LOVENOX) injection  40 mg Subcutaneous Q24H  . feeding supplement  237 mL Oral BID BM  . furosemide  40 mg Oral Daily  . levothyroxine  175 mcg Oral QAC breakfast  . mouth rinse  15 mL Mouth Rinse BID  . multivitamin with minerals  1 tablet Oral Daily  . pantoprazole  40 mg Oral Daily  . predniSONE  30 mg Oral Q breakfast  . sodium chloride flush  3 mL Intravenous Q12H  . traZODone  50 mg Oral QHS  . zinc sulfate  220 mg Oral Daily    Infusions: . sodium  chloride Stopped (03/14/21 1900)  . sodium chloride 5 mL/hr at 03/15/21 0555  . sodium chloride    . sodium chloride    . ampicillin (OMNIPEN) IV 2 g (03/16/21 0920)  . cefTRIAXone (ROCEPHIN)  IV Stopped (03/15/21 2245)  . famotidine      PRN Medications: sodium chloride, sodium chloride, sodium chloride, acetaminophen **OR** acetaminophen, albuterol, chlorpheniramine-HYDROcodone, fluticasone, guaiFENesin-dextromethorphan, hydrALAZINE, lidocaine (PF), sodium chloride flush, sodium chloride flush    Assessment/Plan   1. 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 Intervalvular fibrosa intact. Also appears to be small vegetation on P2. 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  - CT surgery following. No plan for surgery at this time due to PNA with severe air space disease.  - Patient and family desire transfer to [facility]  I have spoken to Dr. Tretha Sciara [facility]) at [facility] who has accepted him in transfer when bed available. Will plan R/L cath here today.  Will repeat CXR. Continue IV abx and daily lasix. Can start gentle afterload reduction after cath but given severity of MR and structural damage to valve not sure how much this will actually help.  Will need eventual MVR once pulmonary situation improves.    2. Acute COVID 19 Infection w/ COVID PNA - abx per above - on prednisone. Received Remdesivir   Length of Stay: 13  Arvilla Meres, MD  03/16/2021, 9:37 AM  Advanced Heart Failure Team Pager [phone] (M-F; 7a - 5p)  Please contact [facility] for night-coverage after hours (5p -7a ) and weekends on [email or web address]

## 2021-03-16 NOTE — Progress Notes (Signed)
Physical Therapy Discharge Patient Details Name: Brandon [name] MRN: [id number] DOB: 05/12/1951 Today's Date: 03/16/2021 Time: 0834-0850 PT Time Calculation (min) (ACUTE ONLY): 16 min  Patient discharged from PT services secondary to goals met and no further PT needs identified.  Please see latest therapy progress note for current level of functioning and progress toward goals.    Progress and discharge plan discussed with patient and/or caregiver: Patient/Caregiver agrees with plan  GP     Cary W Maycok 03/16/2021, 9:00 AM   Cary Maycock PT Acute Rehabilitation Services Pager [phone] Office [phone]  

## 2021-03-16 NOTE — Progress Notes (Signed)
PROGRESS NOTE                                                                                                                                                                                                             Patient Demographics:    Brandon [name], is a 70 y.o. male, DOB - [birth year], GUY:[id number]  Outpatient Primary MD for the patient is Soundra Pilon, FNP   Admit date - 03/03/2021   LOS - 13  Chief Complaint  Patient presents with  . Positive Blood Cultures       Brief Narrative: Patient is a 70 y.o. male with PMHx of COVID infection in October 2021-presented to the ED on 4/8 with fever, cough, shortness of breath-was found to have pneumonia-he refused admission and was discharged home on Augmentin and Zithromax-he returned later to the ED as his blood cultures were positive for Enterococcus.  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  COVID-19 vaccinated status: Vaccinated  Significant Events: 4/9>> Admit to [facility] for pneumonia/enterococcal bacteremia/COVID-19 infection  Significant studies: 4/8>> CTA chest: No PE, right upper lobe pneumonia. 4/8>> CT abdomen: No acute abnormalities 4/10>> Echo: Mitral valve vegetation-moderate MR, EF 60-65% 4/18>> CTA chest: Extensive pneumonia, large bilateral pleural effusions.  No PE. 4/20>> TEE: Severe MR-flail anterior leaflet with perforation   COVID-19 medications: Steroids: 4/12>> Remdesivir: 4/10>> 4/14  Antibiotics: Ampicillin: 4/9>> Rocephin: 4/9>>  Microbiology data: 4/8>> blood culture: Enterococcus faecalis 4/9>> blood culture: No growth 4/9>> urine culture: Negative 4/19>> pleural fluid culture: No growth  Procedures: 4/19>> right thoracocentesis by IR 4/19>> right PICC line 4/20>> TEE 4/22>> LHC/RHC: Mild nonobstructive CAD, EF 60%, severe V waves    Consults: ID,  cardiothoracic surgery, cardiology, IR  DVT prophylaxis: enoxaparin (LOVENOX) injection 40 mg Start: 03/17/21 0800 Place TED hose Start: 03/11/21 1757    Subjective:   Seen earlier this morning before RHC/LHC-no complaints.   Assessment  & Plan :   Sepsis due to enterococcal bacteremia with mitral valve endocarditis: Sepsis physiology has resolved-cultures/antimicrobial therapy as above.  TEE with severe MR-flail A2 segment with perforation-per [facility]VS extremely high risk surgery given significant pneumonia on imaging studies.  Cardiology has discussed with [facility]-has been accepted as a transfer-awaiting bed.  Acute hypoxic respiratory failure due to multifocal pneumonia from Enterococcus and  COVID-19: Improving-only on 2 L of oxygen-continue to taper down steroids rapidly.  No longer on isolation.    Bilateral pleural effusions: Transudative by lights criteria.  Suspect this could be from hypoalbuminemia and probably diastolic CHF in the setting of mitral regurgitation.  Continue oral furosemide.  Hypokalemia: Repleted.  Normocytic anemia: Due to acute illness-monitor CBC periodically.  No evidence of blood loss.  Hypothyroidism: Continue levothyroxine  Nutrition Problem: Nutrition Problem: Increased nutrient needs Etiology: acute illness Signs/Symptoms: estimated needs Interventions: Ensure Enlive (each supplement provides 350kcal and 20 grams of protein),MVI   GI prophylaxis: PPI  ABG:    Component Value Date/Time   PHART 7.455 (H) 03/16/2021 1110   PCO2ART 34.8 03/16/2021 1110   PO2ART 87 03/16/2021 1110   HCO3 27.3 03/16/2021 1119   TCO2 28 03/16/2021 1119   O2SAT 69.0 03/16/2021 1119    Vent Settings: N/A    Condition -Stable  Family Communication  : None at bedside.  Code Status :  Full Code  Diet :  Diet Order            Diet Heart Room service appropriate? Yes; Fluid consistency: Thin  Diet effective now           Diet - low sodium heart healthy                   Disposition Plan  :   Status is: Inpatient  Remains inpatient appropriate because:Inpatient level of care appropriate due to severity of illness   Dispo: The patient is from: Home              Anticipated d/c is to: Home              Patient currently is not medically stable to d/c.   Difficult to place patient No   Barriers to discharge: Enterococcus bacteremia-mitral valve endocarditis-work-up in progress.  Antimicorbials  :    Anti-infectives (From admission, onward)   Start     Dose/Rate Route Frequency Ordered Stop   03/15/21 0000  cefTRIAXone 2 g in sodium chloride 0.9 % 100 mL        2 g Intravenous Every 12 hours 03/15/21 1544     03/15/21 0000  ampicillin 2 g in sodium chloride 0.9 % 100 mL        2 g Intravenous Every 4 hours 03/15/21 1544     03/05/21 1000  remdesivir 100 mg in sodium chloride 0.9 % 100 mL IVPB       "Followed by" Linked Group Details   100 mg 200 mL/hr over 30 Minutes Intravenous Daily 03/04/21 0035 03/08/21 1024   03/04/21 1000  remdesivir 100 mg in sodium chloride 0.9 % 100 mL IVPB  Status:  Discontinued       "Followed by" Linked Group Details   100 mg 200 mL/hr over 30 Minutes Intravenous Daily 03/03/21 1806 03/04/21 0035   03/04/21 1000  remdesivir 200 mg in sodium chloride 0.9% 250 mL IVPB       "Followed by" Linked Group Details   200 mg 580 mL/hr over 30 Minutes Intravenous Once 03/04/21 0035 03/04/21 1230   03/03/21 2000  cefTRIAXone (ROCEPHIN) 2 g in sodium chloride 0.9 % 100 mL IVPB        2 g 200 mL/hr over 30 Minutes Intravenous Every 12 hours 03/03/21 1708 04/14/21 2359   03/03/21 2000  remdesivir 200 mg in sodium chloride 0.9% 250 mL IVPB  Status:  Discontinued       "  Followed by" Linked Group Details   200 mg 580 mL/hr over 30 Minutes Intravenous Once 03/03/21 1806 03/04/21 0035   03/03/21 1600  ampicillin (OMNIPEN) 2 g in sodium chloride 0.9 % 100 mL IVPB        2 g 300 mL/hr over 20 Minutes Intravenous Every 4  hours 03/03/21 1525 04/14/21 2359      Inpatient Medications  Scheduled Meds: . vitamin C  500 mg Oral Daily  . Chlorhexidine Gluconate Cloth  6 each Topical Daily  . cholecalciferol  1,000 Units Oral Daily  . [START ON 03/17/2021] enoxaparin (LOVENOX) injection  40 mg Subcutaneous Q24H  . feeding supplement  237 mL Oral BID BM  . furosemide  40 mg Oral Daily  . levothyroxine  175 mcg Oral QAC breakfast  . mouth rinse  15 mL Mouth Rinse BID  . multivitamin with minerals  1 tablet Oral Daily  . pantoprazole  40 mg Oral Daily  . predniSONE  30 mg Oral Q breakfast  . sodium chloride flush  3 mL Intravenous Q12H  . sodium chloride flush  3 mL Intravenous Q12H  . traZODone  50 mg Oral QHS  . zinc sulfate  220 mg Oral Daily   Continuous Infusions: . sodium chloride Stopped (03/14/21 1900)  . sodium chloride 5 mL/hr at 03/15/21 0555  . sodium chloride    . ampicillin (OMNIPEN) IV 2 g (03/16/21 0920)  . cefTRIAXone (ROCEPHIN)  IV 2 g (03/16/21 0950)   PRN Meds:.sodium chloride, sodium chloride, sodium chloride, acetaminophen, albuterol, chlorpheniramine-HYDROcodone, fluticasone, guaiFENesin-dextromethorphan, hydrALAZINE, hydrALAZINE, labetalol, lidocaine (PF), ondansetron (ZOFRAN) IV, sodium chloride flush, sodium chloride flush   Time Spent in minutes  15  See all Orders from today for further details   Jeoffrey Massed M.D on 03/16/2021 at 4:01 PM  To page go to [email or web address] - use universal password  [facility] -  Office  [phone]    Objective:   Vitals:   03/16/21 1319 03/16/21 1334 03/16/21 1352 03/16/21 1445  BP: (!) 112/59 111/62 (!) 177/43 115/61  Pulse: 95 93 67 92  Resp:   14   Temp:      TempSrc:      SpO2: 96% 96% 98% 93%  Weight:      Height:        Wt Readings from Last 3 Encounters:  03/16/21 81.5 kg  03/02/21 86.4 kg  09/19/20 89.4 kg     Intake/Output Summary (Last 24 hours) at 03/16/2021 1601 Last data filed at 03/16/2021 1200 Gross  per 24 hour  Intake 977.44 ml  Output 425 ml  Net 552.44 ml     Physical Exam Gen Exam:Alert awake-not in any distress HEENT:atraumatic, normocephalic Chest: B/L clear to auscultation anteriorly CVS:S1S2 regular Abdomen:soft non tender, non distended Extremities:no edema Neurology: Non focal Skin: no rash   Data Review:    CBC Recent Labs  Lab 03/12/21 0825 03/13/21 0155 03/14/21 0158 03/15/21 0413 03/16/21 0436 03/16/21 1109 03/16/21 1110 03/16/21 1117 03/16/21 1119  WBC 10.0 19.9* 12.2* 12.5* 9.6  --   --   --   --   HGB 11.2* 11.8* 10.2* 10.7* 11.3* 10.2* 10.9* 10.9* 10.9*  HCT 35.0* 36.2* 31.9* 34.4* 36.3* 30.0* 32.0* 32.0* 32.0*  PLT 428* 469* 387 361 305  --   --   --   --   MCV 94.9 94.0 95.5 97.5 97.1  --   --   --   --   MCH 30.4 30.6 30.5 30.3  30.2  --   --   --   --   MCHC 32.0 32.6 32.0 31.1 31.1  --   --   --   --   RDW 14.2 14.7 14.9 14.9 15.0  --   --   --   --   LYMPHSABS 0.8 0.8 0.6* 1.5 1.5  --   --   --   --   MONOABS 0.2 0.7 0.6 0.7 0.5  --   --   --   --   EOSABS 0.0 0.0 0.0 0.0 0.1  --   --   --   --   BASOSABS 0.0 0.0 0.0 0.0 0.0  --   --   --   --     Chemistries  Recent Labs  Lab 03/10/21 0503 03/11/21 0355 03/12/21 0825 03/13/21 0155 03/14/21 0158 03/15/21 0413 03/16/21 0436 03/16/21 1109 03/16/21 1110 03/16/21 1117 03/16/21 1119  NA 138 136 140 137  --  139  --  137 138 138 136  K 3.9 3.8 4.7 3.3*  --  3.8  --  3.4* 3.4* 3.6 3.6  CL 103 99 100 95*  --  103  --   --   --   --   --   CO2 26 29 33* 32  --  31  --   --   --   --   --   GLUCOSE 156* 105* 138* 126*  --  100*  --   --   --   --   --   BUN 29* 25* 22 25*  --  33*  --   --   --   --   --   CREATININE 1.03 0.96 1.14 1.01  --  0.96  --   --   --   --   --   CALCIUM 8.2* 8.6* 8.9 9.1  --  8.4*  --   --   --   --   --   MG 2.1 2.1 2.1 2.0 2.2 2.2 2.0  --   --   --   --   AST 23 23 19   --   --   --   --   --   --   --   --   ALT 45* 46* 38  --   --   --   --   --   --    --   --   ALKPHOS 97 107 92  --   --   --   --   --   --   --   --   BILITOT 0.6 0.6 1.0  --   --   --   --   --   --   --   --    ------------------------------------------------------------------------------------------------------------------ No results for input(s): CHOL, HDL, LDLCALC, TRIG, CHOLHDL, LDLDIRECT in the last 72 hours.  No results found for: HGBA1C ------------------------------------------------------------------------------------------------------------------ No results for input(s): TSH, T4TOTAL, T3FREE, THYROIDAB in the last 72 hours.  Invalid input(s): FREET3 ------------------------------------------------------------------------------------------------------------------ Recent Labs    03/15/21 0413 03/16/21 0436  FERRITIN 393* 448*    Coagulation profile No results for input(s): INR, PROTIME in the last 168 hours.  Recent Labs    03/15/21 0413 03/16/21 0436  DDIMER 1.64* 2.45*    Cardiac Enzymes No results for input(s): CKMB, TROPONINI, MYOGLOBIN in the last 168 hours.  Invalid input(s): CK ------------------------------------------------------------------------------------------------------------------    Component Value Date/Time   BNP 331.0 (H) 03/13/2021  1610    Micro Results Recent Results (from the past 240 hour(s))  Gram stain     Status: None   Collection Time: 03/13/21  2:30 PM   Specimen: Lung, Right; Pleural Fluid  Result Value Ref Range Status   Specimen Description FLUID PLEURAL  Final   Special Requests NONE  Final   Gram Stain   Final    WBC PRESENT, PREDOMINANTLY MONONUCLEAR NO ORGANISMS SEEN CYTOSPIN SMEAR Performed at [facility], [address] [place], [place] [address]    Report Status 03/13/2021 FINAL  Final  Culture, body fluid w Gram Stain-bottle     Status: None (Preliminary result)   Collection Time: 03/13/21  4:30 PM   Specimen: Fluid  Result Value Ref Range Status   Specimen Description FLUID  PERITONEAL  Final   Special Requests BOTTLES DRAWN AEROBIC AND ANAEROBIC  Final   Culture   Final    NO GROWTH 3 DAYS Performed at [facility], [address] [place], [place] [address]    Report Status PENDING  Incomplete    Radiology Reports DG Chest 1 View  Result Date: 03/13/2021 CLINICAL DATA:  Status post left thoracentesis. EXAM: CHEST  1 VIEW COMPARISON:  CT 03/12/2021.  Chest x-ray 03/08/2021. FINDINGS: Stable cardiomegaly. Bilateral prominent pulmonary infiltrates, predominantly in the upper lobes, are again noted. Tiny bilateral pleural effusions cannot be excluded. Very tiny questionable left apical pneumothorax noted following thoracentesis. Postsurgical changes left clavicle. Surgical clips noted over the neck. IMPRESSION: 1. Very questionable tiny left apical pneumothorax post thoracentesis. 2. Persistent prominent bilateral pulmonary infiltrates, predominantly in the upper lobes, again noted. Critical Value/emergent results were called by telephone at the time of interpretation on 03/13/2021 at 2:07 pm to provider [facility] , who verbally acknowledged these results. Electronically Signed   By: Maisie Fus  Register   On: 03/13/2021 14:07   CT ANGIO CHEST PE W OR WO CONTRAST  Result Date: 03/12/2021 CLINICAL DATA:  COVID pneumonia. Elevated D-dimer. Worsening shortness of breath EXAM: CT ANGIOGRAPHY CHEST WITH CONTRAST TECHNIQUE: Multidetector CT imaging of the chest was performed using the standard protocol during bolus administration of intravenous contrast. Multiplanar CT image reconstructions and MIPs were obtained to evaluate the vascular anatomy. CONTRAST:  69mL OMNIPAQUE IOHEXOL 350 MG/ML SOLN COMPARISON:  03/02/2021 FINDINGS: Cardiovascular: No filling defects in the pulmonary arteries to suggest pulmonary emboli. Heart is mildly enlarged. Aorta normal caliber. Scattered calcifications in the aorta. Mediastinum/Nodes: Borderline size mediastinal lymph nodes diffusely,  likely reactive. Trachea and esophagus are unremarkable. Thyroid unremarkable. Lungs/Pleura: Extensive bilateral airspace disease, worsening significantly since prior study particularly in the mid and upper lung zones. Large bilateral pleural effusions. Upper Abdomen: Imaging into the upper abdomen demonstrates no acute findings. Musculoskeletal: Chest wall soft tissues are unremarkable. No acute bony abnormality. Review of the MIP images confirms the above findings. IMPRESSION: Extensive bilateral airspace disease, particularly in the mid and upper lung fields most compatible with pneumonia or ARDS. Large bilateral pleural effusions. Cardiomegaly. No evidence of pulmonary embolus. Electronically Signed   By: Charlett Nose M.D.   On: 03/12/2021 03:00   CT Angio Chest PE W/Cm &/Or Wo Cm  Result Date: 03/02/2021 CLINICAL DATA:  Fever, abdominal pain. EXAM: CT ANGIOGRAPHY CHEST CT ABDOMEN AND PELVIS WITH CONTRAST TECHNIQUE: Multidetector CT imaging of the chest was performed using the standard protocol during bolus administration of intravenous contrast. Multiplanar CT image reconstructions and MIPs were obtained to evaluate the vascular anatomy. Multidetector CT imaging  of the abdomen and pelvis was performed using the standard protocol during bolus administration of intravenous contrast. Unfortunately, there is motion artifact during the abdominal scan, limiting these images. CONTRAST:  OMNIPAQUE IOHEXOL 350 MG/ML SOLN COMPARISON:  March 10, 2017. FINDINGS: CTA CHEST FINDINGS Cardiovascular: Satisfactory opacification of the pulmonary arteries to the segmental level. No evidence of pulmonary embolism. Normal heart size. No pericardial effusion. Mediastinum/Nodes: Status post thyroidectomy. The esophagus is unremarkable. No adenopathy is noted. Lungs/Pleura: No pneumothorax or pleural effusion is noted. Right upper lobe airspace opacity is noted concerning for pneumonia. Musculoskeletal: No chest wall  abnormality. No acute or significant osseous findings. Review of the MIP images confirms the above findings. CT ABDOMEN and PELVIS FINDINGS Hepatobiliary: No focal liver abnormality is seen. No gallstones, gallbladder wall thickening, or biliary dilatation. Pancreas: Unremarkable. No pancreatic ductal dilatation or surrounding inflammatory changes. Spleen: Normal in size without focal abnormality. Adrenals/Urinary Tract: Adrenal glands appear normal. Visualized portions of kidneys are unremarkable, although the lower poles are not well visualized due to patient motion artifact. No definite hydronephrosis or renal obstruction is noted. No definite renal or ureteral calculi are noted. Urinary bladder is unremarkable. Stomach/Bowel: Stomach is within normal limits. Appendix appears normal. No evidence of bowel wall thickening, distention, or inflammatory changes. Vascular/Lymphatic: No significant vascular findings are present. No enlarged abdominal or pelvic lymph nodes. Reproductive: Stable mild prostatic enlargement is noted. Other: Small fat containing right inguinal hernia is noted. No ascites is noted. Musculoskeletal: No acute or significant osseous findings. Review of the MIP images confirms the above findings. IMPRESSION: No definite evidence of pulmonary embolus. Probable right upper lobe pneumonia. Limited evaluation of the kidneys is noted due to patient motion artifact. Lower poles are not well visualized and therefore pathology in these areas cannot be excluded. No definite abnormality is seen involving the visualized portions of the kidneys. Small fat containing right inguinal hernia. Stable mild prostatic enlargement. Electronically Signed   By: Lupita Raider M.D.   On: 03/02/2021 16:49   CT Abdomen Pelvis W Contrast  Result Date: 03/02/2021 CLINICAL DATA:  Fever, abdominal pain. EXAM: CT ANGIOGRAPHY CHEST CT ABDOMEN AND PELVIS WITH CONTRAST TECHNIQUE: Multidetector CT imaging of the chest was  performed using the standard protocol during bolus administration of intravenous contrast. Multiplanar CT image reconstructions and MIPs were obtained to evaluate the vascular anatomy. Multidetector CT imaging of the abdomen and pelvis was performed using the standard protocol during bolus administration of intravenous contrast. Unfortunately, there is motion artifact during the abdominal scan, limiting these images. CONTRAST:  OMNIPAQUE IOHEXOL 350 MG/ML SOLN COMPARISON:  March 10, 2017. FINDINGS: CTA CHEST FINDINGS Cardiovascular: Satisfactory opacification of the pulmonary arteries to the segmental level. No evidence of pulmonary embolism. Normal heart size. No pericardial effusion. Mediastinum/Nodes: Status post thyroidectomy. The esophagus is unremarkable. No adenopathy is noted. Lungs/Pleura: No pneumothorax or pleural effusion is noted. Right upper lobe airspace opacity is noted concerning for pneumonia. Musculoskeletal: No chest wall abnormality. No acute or significant osseous findings. Review of the MIP images confirms the above findings. CT ABDOMEN and PELVIS FINDINGS Hepatobiliary: No focal liver abnormality is seen. No gallstones, gallbladder wall thickening, or biliary dilatation. Pancreas: Unremarkable. No pancreatic ductal dilatation or surrounding inflammatory changes. Spleen: Normal in size without focal abnormality. Adrenals/Urinary Tract: Adrenal glands appear normal. Visualized portions of kidneys are unremarkable, although the lower poles are not well visualized due to patient motion artifact. No definite hydronephrosis or renal obstruction is noted. No definite renal  or ureteral calculi are noted. Urinary bladder is unremarkable. Stomach/Bowel: Stomach is within normal limits. Appendix appears normal. No evidence of bowel wall thickening, distention, or inflammatory changes. Vascular/Lymphatic: No significant vascular findings are present. No enlarged abdominal or pelvic lymph nodes.  Reproductive: Stable mild prostatic enlargement is noted. Other: Small fat containing right inguinal hernia is noted. No ascites is noted. Musculoskeletal: No acute or significant osseous findings. Review of the MIP images confirms the above findings. IMPRESSION: No definite evidence of pulmonary embolus. Probable right upper lobe pneumonia. Limited evaluation of the kidneys is noted due to patient motion artifact. Lower poles are not well visualized and therefore pathology in these areas cannot be excluded. No definite abnormality is seen involving the visualized portions of the kidneys. Small fat containing right inguinal hernia. Stable mild prostatic enlargement. Electronically Signed   By: Lupita Raider M.D.   On: 03/02/2021 16:49   CARDIAC CATHETERIZATION  Result Date: 03/16/2021  Mid LAD lesion is 30% stenosed.  1st Mrg lesion is 20% stenosed.  Findings: Ao = 94/60 (76) LV = 96/5 RA =  2 RV = 36/4 PA = 43/15 (29) PCW = 22 (v waves 35-40 range) Fick cardiac output/index = 6.2/3.0 PVR = 1.1 WU SVR = 963 Ao sat = 97% PA sat = 67%, 68% Assessment: 1. Mild non-obstructive CAD 2. Normal EF 60% 3. Severe v-waves in PCW tracing in setting of known severe MR 4. COVID PNA Plan/Discussion: Will need MVR when respiratory status permits. Awaiting transfer to [facility]. Arvilla Meres, MD 11:46 AM   DG CHEST PORT 1 VIEW  Result Date: 03/16/2021 CLINICAL DATA:  COVID 19. EXAM: PORTABLE CHEST 1 VIEW COMPARISON:  03/13/2021 FINDINGS: Bilateral upper lung predominant airspace disease is similar to prior. No evidence for pneumothorax on the current exam. A nodular density projects in the parahilar right lower lung. Right PICC line tip overlies the distal SVC. Telemetry leads overlie the chest. IMPRESSION: 1. No substantial interval change in the bilateral upper lung predominant airspace disease. 2. No evidence for pneumothorax on the current exam. 3. Nodular density in the right lower lung likely reflects residual  airspace disease is seen on CT of 03/12/2021 but given nodular appearance today, close follow-up recommended. Electronically Signed   By: Kennith Center M.D.   On: 03/16/2021 12:12   DG Chest Port 1 View  Result Date: 03/08/2021 CLINICAL DATA:  Shortness of breath.  Recent COVID-19 positive EXAM: PORTABLE CHEST 1 VIEW COMPARISON:  March 06, 2021 chest radiograph; chest radiograph and chest CT March 02, 2021 FINDINGS: There remains airspace opacity throughout the right upper lobe with volume loss. Airspace opacity throughout much of the left upper lobe as well as in the left lower lung region and to a lesser extent in the right base remain. Heart is borderline enlarged with pulmonary vascular normal. No adenopathy. Postoperative change left clavicle. Evidence of previous thyroidectomy. IMPRESSION: Multifocal airspace opacity, most pronounced in the right upper lobe. There is slight increase in right upper lobe consolidation and volume loss compared to most recent study. Ill-defined airspace opacity elsewhere unchanged. Suspect multifocal pneumonia, potentially of atypical organism etiology. Stable cardiac silhouette. Electronically Signed   By: Bretta Bang III M.D.   On: 03/08/2021 07:56   DG Chest Port 1 View  Result Date: 03/06/2021 CLINICAL DATA:  70 year old male positive COVID-19 on 03/03/2021. Shortness of breath. EXAM: PORTABLE CHEST 1 VIEW COMPARISON:  Portable chest 03/03/2021. FINDINGS: Portable AP semi upright view at 0830 hours. Progressed and  extensive airspace opacity now in the right upper lobe, about both hila. Left lung apex and the lateral costophrenic angles remain relatively spared. Stable lung volumes and mediastinal contours. No pneumothorax. Visualized tracheal air column is within normal limits. Prior thyroidectomy and left clavicle ORIF. IMPRESSION: Progressed bilateral pneumonia, now moderate to severe. No pneumothorax or pleural effusion. Electronically Signed   By: Odessa Fleming M.D.    On: 03/06/2021 08:34   DG Chest Port 1 View  Result Date: 03/03/2021 CLINICAL DATA:  Fever. EXAM: PORTABLE CHEST 1 VIEW COMPARISON:  03/02/2021 FINDINGS: 1535 hours. Patchy airspace disease noted right upper lung and right base. Interstitial markings are diffusely coarsened with chronic features. Bones are diffusely demineralized. Telemetry leads overlie the chest. IMPRESSION: Similar appearance of patchy airspace disease right upper and lower lung compatible with pneumonia. Chronic interstitial changes. Electronically Signed   By: Kennith Center M.D.   On: 03/03/2021 16:03   DG Chest Port 1 View  Result Date: 03/02/2021 CLINICAL DATA:  Shortness of breath EXAM: PORTABLE CHEST 1 VIEW COMPARISON:  July 03, 2020 FINDINGS: There is diffuse interstitial thickening without frank edema or consolidation. Heart size and pulmonary vascularity are normal. No adenopathy. Evidence of prior trauma with postoperative fixation left clavicle. Evidence of old healed fracture anterior right second rib. IMPRESSION: Diffuse interstitial thickening, likely representing a degree of chronic bronchitis. No edema or consolidation. Heart size normal. Electronically Signed   By: Bretta Bang III M.D.   On: 03/02/2021 12:13   ECHOCARDIOGRAM COMPLETE  Result Date: 03/04/2021    ECHOCARDIOGRAM REPORT   Patient Name:   JOVANNIE ULIBARRI Date of Exam: 03/04/2021 Medical Rec #:  [id number]       Height:       73.0 in Accession #:    [id number]      Weight:       188.1 lb Date of Birth:  August 19, 1951       BSA:          2.096 m Patient Age:    69 years        BP:           137/77 mmHg Patient Gender: M               HR:           114 bpm. Exam Location:  Inpatient Procedure: 2D Echo, 3D Echo, Cardiac Doppler and Color Doppler Indications:     Bacteremia.  History:         Patient has no prior history of Echocardiogram examinations.                  Abnormal ECG, Arrythmias:Atrial Fibrillation,                  Signs/Symptoms:Bacteremia;  Risk Factors:Hypertension and                  Dyslipidemia. Covid infection. Left pneumothorax.  Sonographer:     Sheralyn Boatman RDCS Referring Phys:  [id number] Scheryl Marten CHIU Diagnosing Phys: Laurance Flatten MD  Sonographer Comments: Technically difficult study. IMPRESSIONS  1. There is a mass present on the anterior mitral valve leaflet concerning for a vegetation that measures 1.4x1.0cm. Suspect moderate mitral regurgitation with two separate jets that are posteriorly and anteriorly directed raising concern for possible leaflet perforation.The mean gradient across the MV is (HR 100) with MVA by continuity of 1.35cm2 suggesting at least mild mitral stenosis in the setting of the mitral  valve vegetation as detailed above. Recommend TEE for further evaluation.  2. Left ventricular ejection fraction, by estimation, is 60 to 65%. The left ventricle has normal function. The left ventricle has no regional wall motion abnormalities. There is moderate asymmetric left ventricular hypertrophy of the basal-septal segment. There is mild LVH of the rest of the LV segments. Left ventricular diastolic parameters are consistent with Grade I diastolic dysfunction (impaired relaxation).  3. Right ventricular systolic function is normal. The right ventricular size is normal. There is severely elevated pulmonary artery systolic pressure. The estimated right ventricular systolic pressure is 71.0 mmHg.  4. The aortic valve is tricuspid. There is mild calcification of the aortic valve. There is moderate thickening of the aortic valve. Aortic valve regurgitation is mild. Mild to moderate aortic valve sclerosis/calcification is present, without any evidence of aortic stenosis. No distinct vegetations visualized, but the valve is thickened.  5. The inferior vena cava is normal in size with greater than 50% respiratory variability, suggesting right atrial pressure of 3 mmHg. Comparison(s): No prior Echocardiogram.  Conclusion(s)/Recommendation(s): Findings concerning for mitral valve vegetation, would recommend a Transesophageal Echocardiogram for clarification. FINDINGS  Left Ventricle: Left ventricular ejection fraction, by estimation, is 60 to 65%. The left ventricle has normal function. The left ventricle has no regional wall motion abnormalities. 3D left ventricular ejection fraction analysis performed but not reported based on interpreter judgement due to suboptimal quality. The left ventricular internal cavity size was normal in size. There is moderate asymmetric left ventricular hypertrophy of the basal-septal segment. Left ventricular diastolic parameters are consistent with Grade I diastolic dysfunction (impaired relaxation). There is flow acceleration in the LVOT, however, no systolic anterior motion visualized. Right Ventricle: The right ventricular size is normal. No increase in right ventricular wall thickness. Right ventricular systolic function is normal. There is severely elevated pulmonary artery systolic pressure. The tricuspid regurgitant velocity is 3.74 m/s, and with an assumed right atrial pressure of 15 mmHg, the estimated right ventricular systolic pressure is 71.0 mmHg. Left Atrium: Left atrial size was normal in size. Right Atrium: Right atrial size was normal in size. Pericardium: There is no evidence of pericardial effusion. Mitral Valve: There is a mass present on the anterior mitral valve leaflet concerning for a vegetation that measures 1.4x1.0cm. Suspect moderate mitral regurgitation with two separate jets that are posteriorly and anteriorly directed raising concern for possible leaflet perforation.The mean gradient across the MV is with MVA by continuity of 1.35cm2 suggesting at least mild mitral stenosis in the setting of the mitral valve vegetation as detailed above. Recommend TEE for further evaluation. The mitral valve is abnormal. There is moderate thickening of the mitral valve  leaflet(s). There is severe calcification of the mitral valve leaflet(s). Mild mitral annular calcification. Moderate mitral valve regurgitation. Mild to moderate mitral valve stenosis. MV peak gradient, 22.1 mmHg. The mean mitral valve gradient is 8.0 mmHg. Tricuspid Valve: The tricuspid valve is normal in structure. Tricuspid valve regurgitation is trivial. Aortic Valve: The aortic valve is tricuspid. There is mild calcification of the aortic valve. There is moderate thickening of the aortic valve. Aortic valve regurgitation is mild. Mild to moderate aortic valve sclerosis/calcification is present, without any evidence of aortic stenosis. Pulmonic Valve: The pulmonic valve was normal in structure. Pulmonic valve regurgitation is trivial. Aorta: The aortic root and ascending aorta are structurally normal, with no evidence of dilitation. Venous: The inferior vena cava is normal in size with greater than 50% respiratory variability, suggesting right atrial  pressure of 3 mmHg. IAS/Shunts: No atrial level shunt detected by color flow Doppler.  LEFT VENTRICLE PLAX 2D LVIDd:         4.10 cm      Diastology LVIDs:         2.70 cm      LV e' medial:    9.79 cm/s LV PW:         1.40 cm      LV E/e' medial:  14.5 LV IVS:        1.70 cm      LV e' lateral:   8.92 cm/s LVOT diam:     2.10 cm      LV E/e' lateral: 15.9 LV SV:         65 LV SV Index:   31 LVOT Area:     3.46 cm  LV Volumes (MOD) LV vol d, MOD A2C: 77.3 ml LV vol d, MOD A4C: 104.0 ml LV vol s, MOD A2C: 18.3 ml LV vol s, MOD A4C: 38.5 ml LV SV MOD A2C:     59.0 ml LV SV MOD A4C:     104.0 ml LV SV MOD BP:      72.3 ml RIGHT VENTRICLE             IVC RV S prime:     10.00 cm/s  IVC diam: 2.00 cm TAPSE (M-mode): 1.3 cm LEFT ATRIUM           Index       RIGHT ATRIUM           Index LA diam:      6.00 cm 2.86 cm/m  RA Area:     14.00 cm LA Vol (A2C): 72.1 ml 34.40 ml/m RA Volume:   26.60 ml  12.69 ml/m LA Vol (A4C): 51.1 ml 24.38 ml/m  AORTIC VALVE              PULMONIC VALVE LVOT Vmax:   109.00 cm/s PR End Diast Vel: 1.86 msec LVOT Vmean:  74.500 cm/s LVOT VTI:    0.188 m  AORTA Ao Root diam: 3.70 cm Ao Asc diam:  3.40 cm MITRAL VALVE                 TRICUSPID VALVE MV Area (PHT): 3.21 cm      TR Peak grad:   56.0 mmHg MV Area VTI:   1.48 cm      TR Vmax:        374.00 cm/s MV Peak grad:  22.1 mmHg MV Mean grad:  8.0 mmHg      SHUNTS MV Vmax:       2.35 m/s      Systemic VTI:  0.19 m MV Vmean:      133.0 cm/s    Systemic Diam: 2.10 cm MV Decel Time: 236 msec MR Peak grad:    70.6 mmHg MR Mean grad:    53.0 mmHg MR Vmax:         420.00 cm/s MR Vmean:        351.0 cm/s MR PISA:         4.02 cm MR PISA Eff ROA: 36 mm MR PISA Radius:  0.80 cm MV E velocity: 141.50 cm/s MV A velocity: 157.00 cm/s MV E/A ratio:  0.90 Laurance Flatten MD Electronically signed by Laurance Flatten MD Signature Date/Time: 03/04/2021/12:56:47 PM    Final (Updated)    ECHO TEE  Result Date: 03/14/2021  TRANSESOPHOGEAL ECHO REPORT   Patient Name:   Lytle ButteHOMAS E Faria Date of Exam: 03/14/2021 Medical Rec #:  [id number]       Height:       73.0 in Accession #:    [id number]      Weight:       188.1 lb Date of Birth:  12-Nov-1951       BSA:          2.096 m Patient Age:    69 years        BP:           114/73 mmHg Patient Gender: M               HR:           92 bpm. Exam Location:  Inpatient Procedure: Transesophageal Echo, 3D Echo, Cardiac Doppler and Color Doppler Indications:     Endocarditis  History:         Patient has prior history of Echocardiogram examinations, most                  recent 03/04/2021. Arrythmias:Atrial Fibrillation; Risk                  Factors:Dyslipidemia.  Sonographer:     Eulah PontSarah Pirrotta RDCS Referring Phys:  [id number] Wendall StadePETER C NISHAN Diagnosing Phys: Charlton HawsPeter Nishan MD PROCEDURE: After discussion of the risks and benefits of a TEE, an informed consent was obtained from the patient. The transesophogeal probe was passed without difficulty through the esophogus of the patient.  Sedation performed by different physician. The patient was monitored while under deep sedation. Anesthestetic sedation was provided intravenously by Anesthesiology: 228.99mg  of Propofol. The patient's vital signs; including heart rate, blood pressure, and oxygen saturation; remained stable throughout the procedure. The patient developed no complications during the procedure. IMPRESSIONS  1. Left ventricular ejection fraction, by estimation, is 55 to 60%. The left ventricle has normal function.  2. Right ventricular systolic function is normal. The right ventricular size is normal.  3. Left atrial size was mildly dilated. No left atrial/left atrial appendage thrombus was detected.  4. Flail A2 segment with perforation at the base of A2 and large vegetation 2.2 cm on ventricular side extending into papillary muscle. Intervalvular fibrosa intact Also appears to be small vegetation on P2. Findings discussed with CVTS Dr Dorris FetchHendrickson     Extensive 3D imaging of MV was performed . The mitral valve is abnormal. 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  5. The aortic valve is tricuspid. There is mild calcification of the aortic valve. Aortic valve regurgitation is trivial. Mild aortic valve sclerosis is present, with no evidence of aortic valve stenosis. FINDINGS  Left Ventricle: Left ventricular ejection fraction, by estimation, is 55 to 60%. The left ventricle has normal function. The left ventricular internal cavity size was normal in size. There is no left ventricular hypertrophy. Right Ventricle: The right ventricular size is normal. No increase in right ventricular wall thickness. Right ventricular systolic function is normal. Left Atrium: Left atrial size was mildly dilated. No left atrial/left atrial appendage thrombus was detected. Right Atrium: Right atrial size was normal in size. Pericardium: There is no evidence of pericardial effusion. Mitral Valve: Flail A2 segment with perforation at the base of A2 and large  vegetation 2.2 cm on ventricular side extending into papillary muscle. Intervalvular fibrosa intact Also appears to be small vegetation on P2. Findings discussed with CVTS Dr Dorris FetchHendrickson Extensive 3D imaging of MV was performed.  The mitral valve is abnormal. 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 Tricuspid Valve: The tricuspid valve is normal in structure. Tricuspid valve regurgitation is mild. Aortic Valve: The aortic valve is tricuspid. There is mild calcification of the aortic valve. Aortic valve regurgitation is trivial. Mild aortic valve sclerosis is present, with no evidence of aortic valve stenosis. Pulmonic Valve: The pulmonic valve was normal in structure. Pulmonic valve regurgitation is mild. Aorta: The aortic root is normal in size and structure. IAS/Shunts: No atrial level shunt detected by color flow Doppler.  MR PISA:        16.08 cm MR PISA Radius: 1.60 cm Charlton Haws MD Electronically signed by Charlton Haws MD Signature Date/Time: 03/14/2021/2:36:40 PM    Final    [place] EKG SITE RITE  Result Date: 03/12/2021 If Site Rite image not attached, placement could not be confirmed due to current cardiac rhythm.  IR THORACENTESIS ASP PLEURAL SPACE W/IMG GUIDE  Result Date: 03/13/2021 INDICATION: COVID pneumonia. Shortness of breath. Right-sided pleural effusion. Request for diagnostic therapeutic thoracentesis. EXAM: ULTRASOUND GUIDED RIGHT THORACENTESIS MEDICATIONS: 1% plain lidocaine, 8 mL COMPLICATIONS: None immediate. PROCEDURE: An ultrasound guided thoracentesis was thoroughly discussed with the patient and questions answered. The benefits, risks, alternatives and complications were also discussed. The patient understands and wishes to proceed with the procedure. Written consent was obtained. Ultrasound was performed to localize and mark an adequate pocket of fluid in the right chest. The area was then prepped and draped in the normal sterile fashion. 1% Lidocaine was used for local anesthesia.  Under ultrasound guidance a 6 Fr Safe-T-Centesis catheter was introduced. Thoracentesis was performed. The catheter was removed and a dressing applied. FINDINGS: A total of approximately 420 mL of slightly hazy yellow fluid was removed. Samples were sent to the laboratory as requested by the clinical team. IMPRESSION: Successful ultrasound guided right thoracentesis yielding 420 mL of pleural fluid. Read by: Brayton El PA-C Electronically Signed   By: Marliss Coots MD   On: 03/13/2021 14:08

## 2021-03-16 NOTE — TOC Progression Note (Signed)
Transition of Care [facility]) - Progression Note    Patient Details  Name: Brandon [name] MRN: [id number] Date of Birth: Nov 02, 1951  [facility] of [facility]) CM/SW Contact  Leone Haven, RN Phone Number: 03/16/2021, 3:26 PM  Clinical Narrative:    Per Staff RN patient is for transfer to [facility] when bed is available.         Expected Discharge Plan and Services           Expected Discharge Date: 03/15/21                                     Social Determinants of Health (SDOH) Interventions    Readmission Risk Interventions No flowsheet data found.

## 2021-03-16 NOTE — Interval H&P Note (Signed)
History and Physical Interval Note:  03/16/2021 10:57 AM  Brandon [name]  has presented today for surgery, with the diagnosis of  severe mitral regurgitation.  The various methods of treatment have been discussed with the patient and family. After consideration of risks, benefits and other options for treatment, the patient has consented to  Procedure(s): RIGHT/LEFT HEART CATH AND CORONARY ANGIOGRAPHY (N/A) as a surgical intervention.  The patient's history has been reviewed, patient examined, no change in status, stable for surgery.  I have reviewed the patient's chart and labs.  Questions were answered to the patient's satisfaction.     [name]

## 2021-03-17 NOTE — Progress Notes (Signed)
Patient discharged to [facility].  Patient is alert and oriented x 4 on discharge. Vitals signs are within normal limits.  Patient is transported via Doctor, general practice, escorted by [employer] staff members.

## 2021-03-18 LAB — CULTURE, BODY FLUID W GRAM STAIN -BOTTLE: Culture: NO GROWTH

## 2021-03-20 NOTE — CV Procedure (Signed)
TEE: Indication MV endocarditis Anesthesia: Propofol  Full report in syngo Extensive 3D imaging of MV done  EF 55-60%  No LAA thrombus Flail A2 segment with perforation at base of leaflet  Large vegetation 2.2 cm on ventricular side of valve Smaller vegetation on P2 Torrential MR  AV sclerosis trivial AR Normal TV/PV  Findings discussed with Dr Teressa Lower and Michiel Cowboy MD [facility]

## 2021-03-21 ENCOUNTER — Ambulatory Visit: Payer: Medicare Other | Admitting: Neurology

## 2021-05-15 ENCOUNTER — Telehealth (HOSPITAL_COMMUNITY): Payer: Self-pay

## 2021-05-15 NOTE — Telephone Encounter (Signed)
Returned pt phone call because he was told by his Human resources officer that he needed cardiac rehab, I advised pt that we havent received a referral from [facility] pt stated that he has the referral, I advised pt that we needed a referral for him pt offered to drop the referral off at the office I also advised pt since he is establishing care with a cardiologist with [facility] on 06/11/2021 that he could ask Dr. Eden Emms to place a cardiac rehab referral and it will automatically be in the system since we do have a 1-3 month backlog right now, pt agreed and understood.

## 2021-06-02 NOTE — Progress Notes (Signed)
CARDIOLOGY CONSULT NOTE       Patient ID: Brandon [name] MRN: [id number] DOB/AGE: [birth year] 70 y.o.  Admit date: (Not on file) Referring Physician: Haney [facility] Meridee Score FNP [place]  Primary Physician: Soundra Pilon, FNP Primary Cardiologist: new Reason for Consultation: MVR  Active Problems:   * No active hospital problems. *   HPI:  70 y.o. 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 SBE in setting of COVID pneumonia as well Post op course with PAF post [facility] 03/30/21  Finished course of antibiotics mid June under direction of ID at [facility] and [facility] line removed On amiodarone and eliquis  Notes indicate 90 day course of amiodarone then d/c Needed to be enrolled in cardiac rehab Post op TEE 03/30/21 with small remnant LA appendage post clip and removal no MR no MS mean gradient 4 mmHg normal function bioprosthetic valve   History also remarkable for Parkinson's and low thyroid   Hospitalized at [facility] 4/23-5/11/22   Doing well working out at [employer]. No bleeding issues No volume overload and no palpitations or PAF  ROS All other systems reviewed and negative except as noted above  Past Medical History:  Diagnosis Date   Anemia    Atrial fibrillation (HCC)    Bacteremia due to Enterococcus    BPH (benign prostatic hyperplasia)    Cervical spine disease    Chest pain 2007   none since   COVID    Endocarditis of mitral valve    Gallbladder disorder    Hemopneumothorax on left    Hiatal hernia    Hypercholesteremia    Hyperlipidemia    Hyperthyroidism    Hypoxia    Meniere's disease    Parkinson disease (HCC)    Pneumothorax on right    PONV (postoperative nausea and vomiting)     also has trouble waking up   S/P MVR (mitral valve replacement)    SOB (shortness of breath)     Family History  Problem Relation Age of Onset   Hypertension Mother    Breast cancer Mother    Heart disease Father    Emphysema Father    Heart attack Sister    Heart attack Brother     Social History   Socioeconomic History   Marital status: Widowed    Spouse name: Not on file   Number of children: Not on file   Years of education: Not on file   Highest education level: Not on file  Occupational History    Comment: factory work  Tobacco Use   Smoking status: Former    Types: Cigarettes    Quit date: 04/18/1979    Years since quitting: 42.1   Smokeless tobacco: Never  Vaping Use   Vaping Use: Never used  Substance and Sexual Activity   Alcohol use: Yes    Comment: 3 timesa a week (wine or beer)   Drug use: No   Sexual activity: Not on file  Other Topics Concern   Not on file  Social History Narrative   Two level home alone   Right handed   Caffeine - one cup coffee am    Exercise - yes walk dogs  Education - 1 year of college    Social Determinants of Health   Financial Resource Strain: Not on file  Food Insecurity: Not on file  Transportation Needs: Not on file  Physical Activity: Not on file  Stress: Not on file  Social Connections: Not on file  Intimate Partner Violence: Not on file    Past Surgical History:  Procedure Laterality Date   CARDIAC CATHETERIZATION     COLONOSCOPY     IR THORACENTESIS ASP PLEURAL SPACE W/IMG GUIDE  03/13/2021   ORIF CLAVICULAR FRACTURE Left 04/18/2016   Procedure: OPEN REDUCTION INTERNAL FIXATION (ORIF) LEFT CLAVICLE FRACTURE;  Surgeon: Francena Hanly, MD;  Location: [facility] OR;  Service: Orthopedics;  Laterality: Left;   RIGHT/LEFT HEART CATH AND CORONARY ANGIOGRAPHY N/A 03/16/2021   Procedure: RIGHT/LEFT HEART CATH AND CORONARY ANGIOGRAPHY;  Surgeon: Dolores Patty, MD;  Location: [facility] INVASIVE CV LAB;  Service: Cardiovascular;   Laterality: N/A;   TEE WITHOUT CARDIOVERSION N/A 03/14/2021   Procedure: TRANSESOPHAGEAL ECHOCARDIOGRAM (TEE);  Surgeon: Wendall Stade, MD;  Location: [facility] ENDOSCOPY;  Service: Cardiovascular;  Laterality: N/A;   THYROIDECTOMY     TONSILLECTOMY        Current Outpatient Medications:    amiodarone (PACERONE) 200 MG tablet, Take 200 mg by mouth daily., Disp: , Rfl:    apixaban (ELIQUIS) 5 MG TABS tablet, Take 5 mg by mouth 2 (two) times daily., Disp: , Rfl:    aspirin EC 81 MG tablet, Take 81 mg by mouth daily. Swallow whole., Disp: , Rfl:    cholecalciferol (VITAMIN D) 1000 units tablet, Take 1,000 Units by mouth daily., Disp: , Rfl:    Cyanocobalamin (VITAMIN B-12 PO), Take by mouth., Disp: , Rfl:    fluticasone (FLONASE) 50 MCG/ACT nasal spray, Place 1 spray into both nostrils daily as needed for allergies., Disp: , Rfl:    levothyroxine (SYNTHROID) 175 MCG tablet, Take 175 mcg by mouth daily., Disp: , Rfl:    Multiple Vitamin (MULTIVITAMIN WITH MINERALS) TABS tablet, Take 1 tablet by mouth daily., Disp: , Rfl:    polyethylene glycol (MIRALAX / GLYCOLAX) 17 g packet, Take 17 g by mouth daily., Disp: , Rfl:    potassium chloride SA (KLOR-CON) 20 MEQ tablet, Take 20 mEq by mouth 2 (two) times daily., Disp: , Rfl:    PROBIOTIC, LACTOBACILLUS, PO, Take by mouth., Disp: , Rfl:    sennosides-docusate sodium (SENOKOT-S) 8.6-50 MG tablet, Take 2 tablets by mouth daily., Disp: , Rfl:    TESTOSTERONE CYPIONATE IM, Inject 200 mg into the muscle once a week., Disp: , Rfl:    torsemide (DEMADEX) 10 MG tablet, Take 10 mg by mouth daily., Disp: , Rfl:    traZODone (DESYREL) 50 MG tablet, Take 50 mg by mouth at bedtime., Disp: , Rfl:     Physical Exam: Blood pressure 132/72, pulse 73, height 6\' 1"  (1.854 m), weight 86.1 kg, SpO2 99 %.    Affect appropriate Healthy:  appears stated age HEENT: normal Neck supple with no adenopathy JVP normal no bruits no thyromegaly Lungs clear with no wheezing and  good diaphragmatic motion Heart:  S1/S2 no murmur, no rub, gallop or click PMI normal post sternotomy  Abdomen: benighn, BS positve, no tenderness, no AAA no bruit.  No HSM or HJR Distal pulses intact with no bruits No edema Neuro non-focal Skin warm and dry No muscular weakness   Labs:   Lab Results  Component Value Date   WBC 9.6 03/16/2021   HGB  10.9 (L) 03/16/2021   HCT 32.0 (L) 03/16/2021   MCV 97.1 03/16/2021   PLT 305 03/16/2021   No results for input(s): NA, K, CL, CO2, BUN, CREATININE, CALCIUM, PROT, BILITOT, ALKPHOS, ALT, AST, GLUCOSE in the last 168 hours.  Invalid input(s): LABALBU Lab Results  Component Value Date   CKTOTAL 14 (L) 03/02/2021   No results found for: CHOL No results found for: HDL No results found for: LDLCALC No results found for: TRIG No results found for: CHOLHDL No results found for: LDLDIRECT    Radiology: No results found.  EKG: Not done    ASSESSMENT AND PLAN:   MV SBE:  post bioprosthetic MVR Dr Melanee Spry 03/23/21 post op TEE normal function SBE prophylaxis  Will d/c diuretics as he is euvolemic with good EF  PAF:  post LAA clipping during surgery NSR d/c amiodarone and eliquis  SBE:  Enterococcus faecalis- antibiotics finished 05/04/21 PIC line removed   Hypothyroidism:  continue synthroid replacement will recheck TSH off amiodarone in 3 months   Time:  spent reviewing records from [facility] TEE, records from [facility] operative note labs, TTE cath  And numerous consultant ID notes 60 minutes   F/U in 59months   Signed: Charlton Haws 06/11/2021, 8:28 AM

## 2021-06-11 ENCOUNTER — Other Ambulatory Visit: Payer: Self-pay

## 2021-06-11 ENCOUNTER — Encounter: Payer: Self-pay | Admitting: Cardiovascular Disease

## 2021-06-11 ENCOUNTER — Ambulatory Visit (INDEPENDENT_AMBULATORY_CARE_PROVIDER_SITE_OTHER): Payer: Medicare Other | Admitting: Cardiovascular Disease

## 2021-06-11 VITALS — BP 132/72 | HR 73 | Ht 73.0 in | Wt 189.8 lb

## 2021-06-11 DIAGNOSIS — E033 Postinfectious hypothyroidism: Secondary | ICD-10-CM | POA: Diagnosis not present

## 2021-06-11 DIAGNOSIS — Z9889 Other specified postprocedural states: Secondary | ICD-10-CM | POA: Diagnosis not present

## 2021-06-11 DIAGNOSIS — I330 Acute and subacute infective endocarditis: Secondary | ICD-10-CM | POA: Diagnosis not present

## 2021-06-11 DIAGNOSIS — I480 Paroxysmal atrial fibrillation: Secondary | ICD-10-CM | POA: Diagnosis not present

## 2021-06-11 NOTE — Patient Instructions (Addendum)
Medication Instructions:  Your physician has recommended you make the following change in your medication:  1-STOP Eliquis 2-STOP Demadex 3-STOP Amiodarone *If you need a refill on your cardiac medications before your next appointment, please call your pharmacy*  Lab Work: If you have labs (blood work) drawn today and your tests are completely normal, you will receive your results only by: MyChart Message (if you have MyChart) OR A paper copy in the mail If you have any lab test that is abnormal or we need to change your treatment, we will call you to review the results.  Testing/Procedures: None ordered today.  Follow-Up: At [facility], you and your health needs are our priority.  As part of our continuing mission to provide you with exceptional heart care, we have created designated Provider Care Teams.  These Care Teams include your primary Cardiologist (physician) and Advanced Practice Providers (APPs -  Physician Assistants and Nurse Practitioners) who all work together to provide you with the care you need, when you need it.  We recommend signing up for the patient portal called "MyChart".  Sign up information is provided on this After Visit Summary.  MyChart is used to connect with patients for Virtual Visits (Telemedicine).  Patients are able to view lab/test results, encounter notes, upcoming appointments, etc.  Non-urgent messages can be sent to your provider as well.   To learn more about what you can do with MyChart, go to [email or web address].    Your next appointment:   3 months  The format for your next appointment:   In Person  Provider:   You may see Dr. Eden Emms or one of the following Advanced Practice Providers on your designated Care Team:   Nada Boozer, NP

## 2021-07-24 NOTE — Progress Notes (Signed)
Assessment/Plan:   1.  Parkinsons Disease  -Patient wishes to hold off on medication for now.  We discussed AAN recommendations  -Patient is following with Dr. Yetta Barre for dermatology.  He just saw him.   Understands that Parkinson's slightly increases risk for melanoma  -Discussed exercise.  He has started back to that plan  2.  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   Subjective:   Brandon [name] was seen today in follow up for Parkinsons disease.  My previous records were reviewed prior to todays visit as well as outside records available to me.  Patient is on no medication for Parkinson's disease, by his choice.  Last seen in October, 2021.  Reports no falls.  Has had much happened this year.  Medical records are reviewed.  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  Patient on amiodarone and Eliquis following this.  They noted that he should have a 90-day course of amiodarone and then it could be discontinued.  He just got back to the gym about 8 weeks ago.  Tremor has "settled down" some now.  No falling.    Current prescribed movement disorder medications: None   PREVIOUS MEDICATIONS: none to date  ALLERGIES:   Allergies  Allergen Reactions   Ceftin [Cefuroxime Axetil] Diarrhea   Oxycodone Nausea And Vomiting   Iohexol     03/02/21 pt received prednisone and benadryl 1hr before CT Abd with contrast, tolerated w/o issue 03/10/17 Patient started sneezing during the injection and continued sneezing after exam. ER doc notified and patient was given benadryl and cortisone    CURRENT MEDICATIONS:  Outpatient Encounter Medications as of 07/26/2021  Medication Sig   aspirin EC 81 MG tablet Take 81 mg by mouth daily. Swallow whole.   cholecalciferol (VITAMIN D) 1000 units tablet Take 1,000 Units by mouth daily.   Cyanocobalamin (VITAMIN B-12 PO) Take by mouth.   fluticasone  (FLONASE) 50 MCG/ACT nasal spray Place 1 spray into both nostrils daily as needed for allergies.   levothyroxine (SYNTHROID) 175 MCG tablet Take 175 mcg by mouth daily.   Multiple Vitamin (MULTIVITAMIN WITH MINERALS) TABS tablet Take 1 tablet by mouth daily.   PROBIOTIC, LACTOBACILLUS, PO Take by mouth.   TESTOSTERONE CYPIONATE IM Inject 200 mg into the muscle once a week.   traZODone (DESYREL) 50 MG tablet Take 50 mg by mouth at bedtime.   [DISCONTINUED] polyethylene glycol (MIRALAX / GLYCOLAX) 17 g packet Take 17 g by mouth daily.   [DISCONTINUED] potassium chloride SA (KLOR-CON) 20 MEQ tablet Take 20 mEq by mouth 2 (two) times daily.   [DISCONTINUED] sennosides-docusate sodium (SENOKOT-S) 8.6-50 MG tablet Take 2 tablets by mouth daily.   No facility-administered encounter medications on file as of 07/26/2021.    Objective:   PHYSICAL EXAMINATION:    VITALS:   Vitals:   07/26/21 1513  BP: 117/76  Pulse: 86  SpO2: 96%  Weight: 198 lb 9.6 oz (90.1 kg)  Height: 6\' 1"  (1.854 m)     GEN:  The patient appears stated age and is in NAD. HEENT:  Normocephalic, atraumatic.  The mucous membranes are moist. The superficial temporal arteries are without ropiness or tenderness.  Neurological examination:  Orientation: The patient is alert and oriented x3. Cranial nerves: There is good facial symmetry with min facial hypomimia. The speech is fluent and clear. Soft palate rises symmetrically and there is no  tongue deviation. Hearing is intact to conversational tone. Sensation: Sensation is intact to light touch throughout Motor: Strength is at least antigravity x4.  Movement examination: Tone: There is mild increased tone on the left. Abnormal movements: no tremor today Coordination:  There is min decremation with RAM's, with finger taps on the L Gait and Station: The patient has no difficulty arising out of a deep-seated chair without the use of the hands. The patient's stride length is  good with just slight decrease arm swing on the L.    I have reviewed and interpreted the following labs independently    Chemistry      Component Value Date/Time   NA 136 03/16/2021 1119   K 3.6 03/16/2021 1119   CL 103 03/15/2021 0413   CO2 31 03/15/2021 0413   BUN 33 (H) 03/15/2021 0413   CREATININE 0.96 03/15/2021 0413      Component Value Date/Time   CALCIUM 8.4 (L) 03/15/2021 0413   ALKPHOS 92 03/12/2021 0825   AST 19 03/12/2021 0825   ALT 38 03/12/2021 0825   BILITOT 1.0 03/12/2021 0825       Lab Results  Component Value Date   WBC 9.6 03/16/2021   HGB 10.9 (L) 03/16/2021   HCT 32.0 (L) 03/16/2021   MCV 97.1 03/16/2021   PLT 305 03/16/2021    Lab Results  Component Value Date   TSH 2.550 03/04/2021     Total time spent on today's visit was 20 minutes, including both face-to-face time and nonface-to-face time.  Time included that spent on review of records (prior notes available to me/labs/imaging if pertinent), discussing treatment and goals, answering patient's questions and coordinating care.  Cc:  Soundra Pilon, FNP

## 2021-07-26 ENCOUNTER — Ambulatory Visit: Payer: Medicare Other | Admitting: Neurology

## 2021-07-26 ENCOUNTER — Other Ambulatory Visit: Payer: Self-pay

## 2021-07-26 VITALS — BP 117/76 | HR 86 | Ht 73.0 in | Wt 198.6 lb

## 2021-07-26 DIAGNOSIS — G20 Parkinson's disease: Secondary | ICD-10-CM | POA: Diagnosis not present

## 2021-07-26 NOTE — Patient Instructions (Signed)
Online Resources for Power over Parkinson's Group   Local [place] Online Groups  Power over Parkinson's Group :   Power Over Parkinson's Patient Education Group will be Wednesday, August 10th-*Hybrid meting*- in person at [address] [place] location and via WEBEX at 2:30 pm.  PLEASE NOTE TIME CHANGE FOR THIS MEETING ONLY   Upcoming Power over Parkinson's Meetings:  2nd Wednesdays of the month at 2 pm Contact Amy Marriott at [email or web address] if interested in participating in this online group Parkinson's Care Partners Group:    3rd Mondays, Contact Misty Paladino Atypical Parkinsonian Patient Group:   4th Wednesdays, Contact Misty Paladino If you are interested in participating in these online groups with Misty, please contact her directly for how to join those meetings.  Her contact information is [email or web address].   Parkinson Foundation:  www.parkinson.org PD Health at Home continues:  Mindfulness Mondays, Expert Briefing Tuesdays, Wellness Wednesdays, Take Time Thursdays, Fitness Fridays -Listings for June 2022 are on the website Wellness Wednesday:  Genetic Testing for Parkinson's Disease through PDGeneration.  Wednesday, August 3rd at 1 pm. Upcoming Webinar:  Use it or Lose It-The Impact of Physical Activity in Parkinson's.  Wednesday, September 7th at 1 pm. Register for expert briefings (webinars) at [email or web address]  Please check out their website to sign up for emails and see their full online offerings  [employer] Foundation:  www.michaeljfox.org  Upcoming Webinar:   Public Policy and You:  How to become a Parkinson's Advocate.  Thursday, August 18th at 12 pm  Check out additional information on their website to see their full online offerings  [employer] Foundation:  www.davisphinneyfoundation.org Upcoming Webinar:  Depression, Anxiety, Mood and Parkinson's.   Tuesday, August 9th  at 4 pm. Care Partner Monthly Meetup.  With  Connie Carpenter Phinney.  First Tuesday of each month, 2 pm Joy Breaks:  First Wednesday of each month, 2-3 pm. There will be art, doodling, making, crafting, listening, laughing, stories, and everything in between. No art experience necessary. No supplies required. Just show up for joy!  Register on their website. Check out additional information to Live Well Today on their website  Parkinson and Movement Disorders (PMD) Alliance:  www.pmdalliance.org NeuroLife Online:  Online Education Events Sign up for emails, which are sent weekly to give you updates on programming and online offerings  Parkinson's Association of [place]:  www.parkinsonassociation.org Caring for Parkinson's, Caring for You Symposium, Saturday, September 10th.  [place], [place].  Symposium Registration - Parkinson Association of [place] Information on online support groups, education events, and online exercises including Yoga, Parkinson's exercises and more-LOTS of information on links to PD resources and online events Virtual Support Group through Parkinson's Association of [place]; next one is scheduled for Wednesday, August 3rd at 2 pm. (These are typically scheduled for the 1st Wednesday of the month at 2 pm).  Visit website for details.  Additional links for movement activities: PWR! Moves Classes at [place] Exercise Room HAVE RESUMED!  Wednesdays 10 and 11 am.  Contact Amy Marriott, PT [email or web address] or [phone] if interested Here is a link to the PWR!Moves classes on Zoom from [place] Parkinson's Foundation - Daily Mon-Sat at 10:00. Via Zoom, FREE and open to all.  There is also a link below via Facebook if you use that platform. https://www.parkinsonsmi.org/mpf-programs/exercise-and-movement-activities https://www.facebook.com/ParkinsonsMI.org/posts/pwr-moves-exercise-class-parkinson-wellness-recovery-online-with-angee-ludwa-pt-/10156827878021813/ Parkinson's Wellness Recovery  (PWR! Moves)  www.pwr4life.org Info on the PWR! Virtual Experience:  You will have access to our expertise through self-assessment, guided plans that start with the PD-specific fundamentals, educational   content, tips, Q&A with an expert, and a growing library of PD-specific pre-recorded and live exercise classes of varying types and intensity - both physical and cognitive! If that is not enough, we offer 1:1 wellness consultations (in-person or virtual) to personalize your PWR! Virtual Experience.  Parkinson Foundation Fitness Fridays:  As part of the PD Health @ Home program, this free video series focuses each week on one aspect of fitness designed to support people living with Parkinson's.  These weekly videos highlight the Parkinson Foundation recent fitness guidelines for people with Parkinson's disease.  www.parkinson.org/understanding-parkinsons/coronavirus/PD-health-at-home/Fitness-Fridays Dance for PD website is offering free, live-stream classes throughout the week, as well as links to digital library of classes:  https://danceforparkinsons.org/ Dance for Parkinson's Class:  [employer] [place].  Free offering for people with Parkinson's and care partners; virtual class.  For more information, contact [phone] or email Magalli Morana at [email or web address] Virtual dance and Pilates for Parkinson's classes: Click on the Community Tab> Parkinson's Movement Initiative Tab.  To register for classes and for more information, visit www.americandancefestival.org and click the "community" tab.    [employer] Parkinson's Cycling Classes  [employer]: 1pm on Fridays-Live classes at [employer] (Contact Margaret Hazen at [email or web address] or [phone]) [employer]: Virtual Classes Mondays and Thursdays /Live classes Tuesday, Wednesday and Thursday (contact Marlee at [email or web address]  or [phone])  [place] [employer] Three levels of classes  are offered Tuesdays and Thursdays:  10:30 am,  12 noon & 1:45 pm at [employer] [facility] [facility].  Active Stretching with Maria, New Class starting in March, on Fridays To observe a class or for  more information, call [phone] or email [email or web address] [employer]: Online Caregiver Education Opportunities:  www.well-springsolutions.org/caregiver-education/caregiver-support-group.  You may also contact Jodi Kolada at [email or web address] or [phone].   Well[employer]Spring Navigator:  Just1Navigator program, a free service to help individuals and families through the journey of determining care for older adults.  The "Navigator" is a social worker, Nicole Reynolds, who will speak with a prospective client and/or loved ones to provide an assessment of the situation and a set of recommendations for a personalized care plan -- all free of charge, and whether [employer] offers the needed service or not. If the need is not a service we provide, we are well-connected with reputable programs in town that we can refer you to.  www.well-springsolutions.org or to speak with the Navigator, call [phone].   

## 2021-07-30 IMAGING — CT CT ANGIO CHEST
2 of 7 series · 18 of 46 positions shown · IV contrast (APPLIED)
Comparison: 03/02/2021

CLINICAL DATA: COVID pneumonia. Elevated D-dimer. Worsening
shortness of breath

EXAM:
CT ANGIOGRAPHY CHEST WITH CONTRAST
TECHNIQUE: Multidetector CT imaging of the chest was performed using the
standard protocol during bolus administration of intravenous
contrast. Multiplanar CT image reconstructions and MIPs were
obtained to evaluate the vascular anatomy.
CONTRAST:  69mL OMNIPAQUE IOHEXOL 350 MG/ML SOLN

[Series 7: thins · axial · 0.70mm/px · z∈[-232,+44]mm · 15 of 445 slices shown]
[im 25/445  lung]
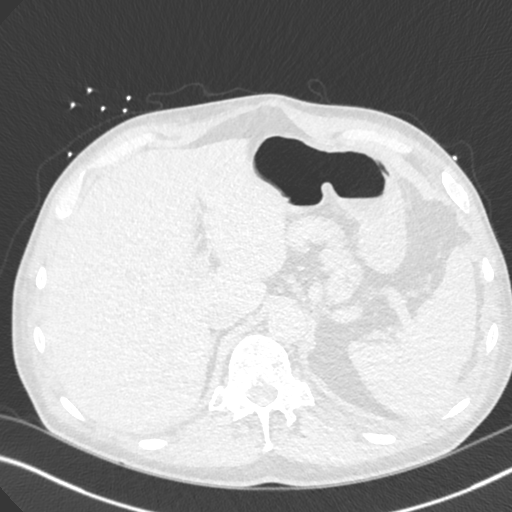
[im 50/445  soft-tissue]
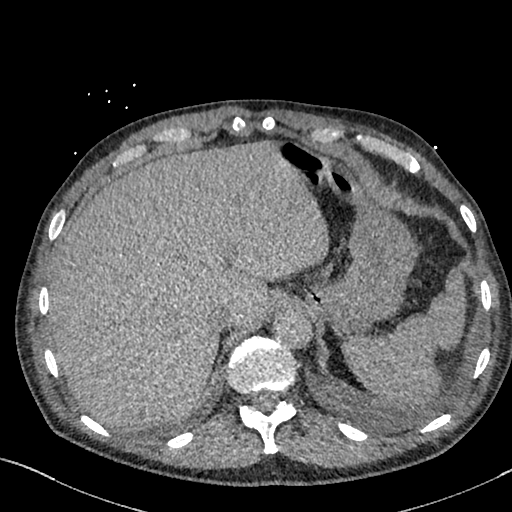
[im 75/445  lung]
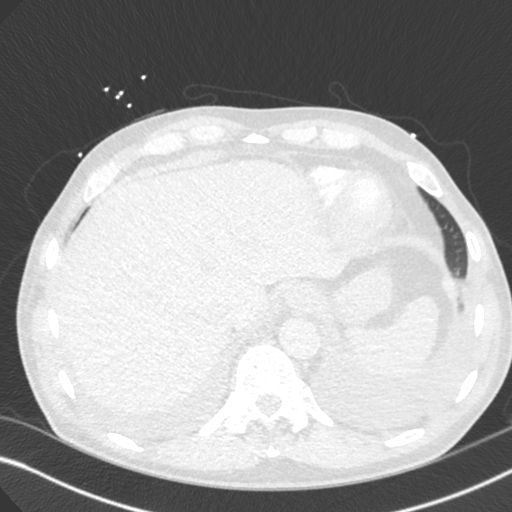
[im 99/445  soft-tissue]
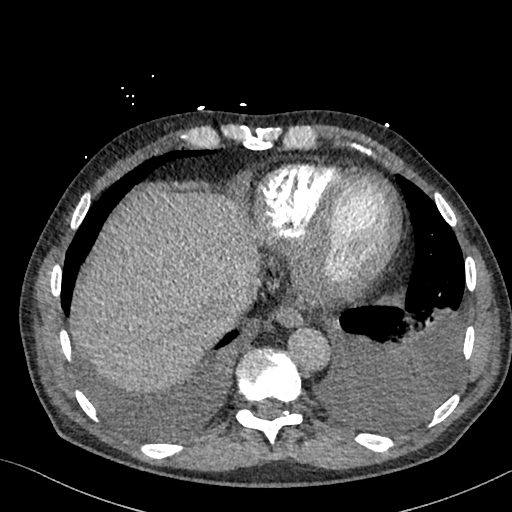
[im 149/445  lung]
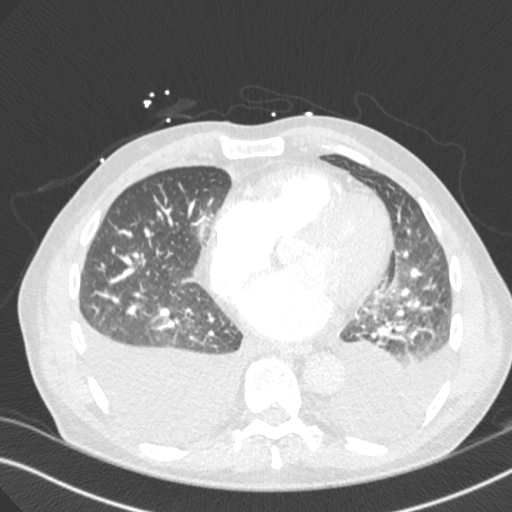
[im 173/445  soft-tissue]
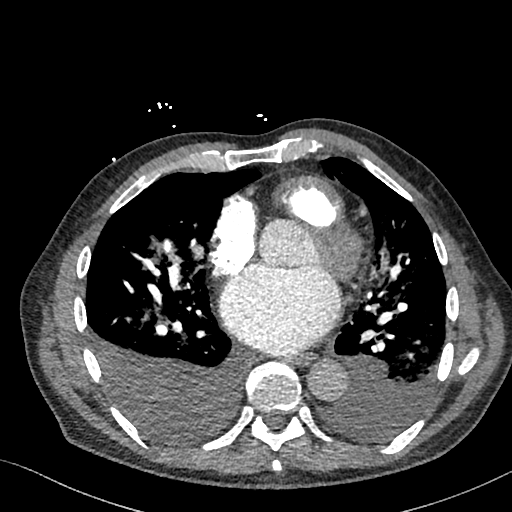
[im 198/445  lung]
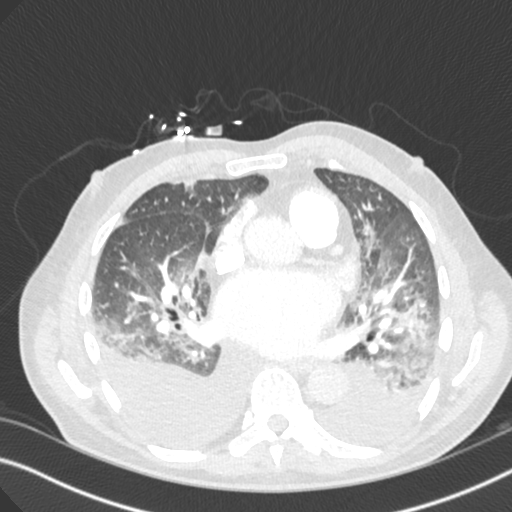
[im 223/445  soft-tissue]
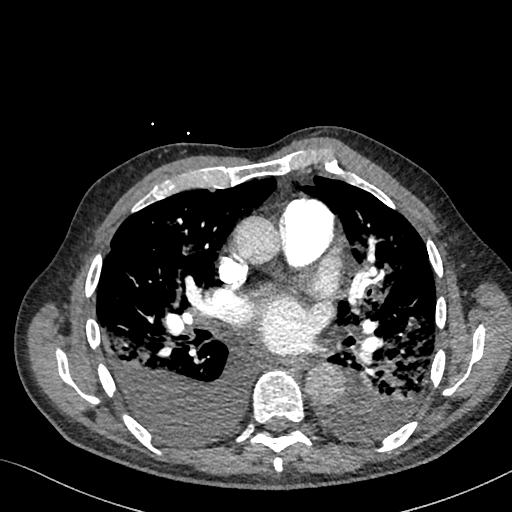
[im 247/445  lung]
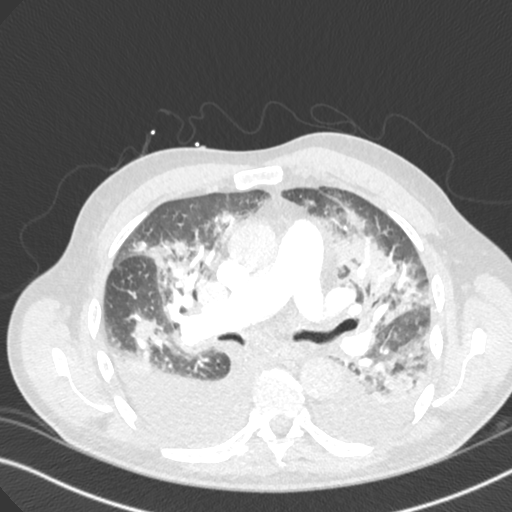
[im 272/445  soft-tissue]
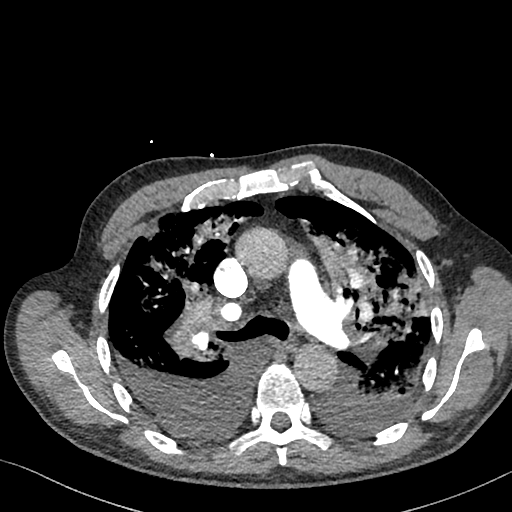
[im 297/445  lung]
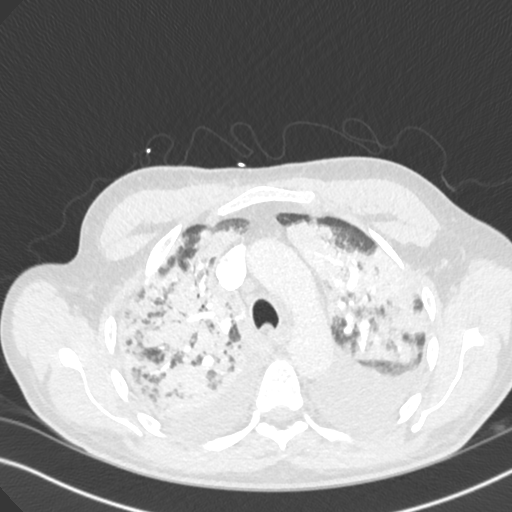
[im 346/445  soft-tissue]
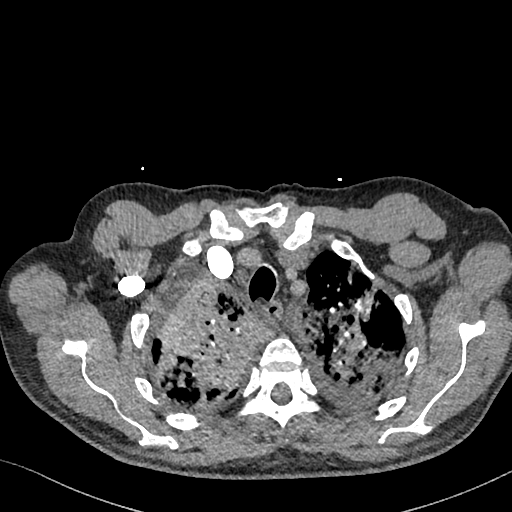
[im 371/445  lung]
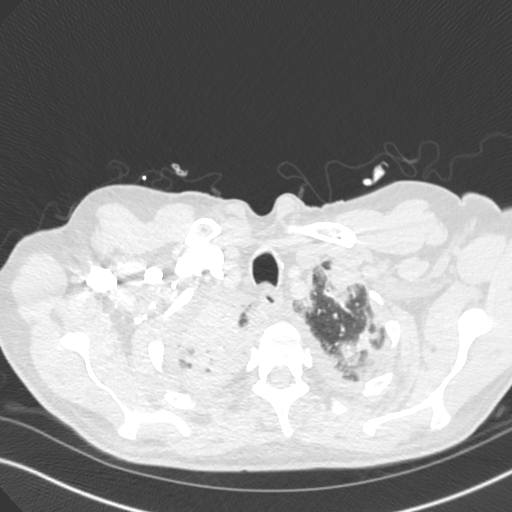
[im 395/445  soft-tissue]
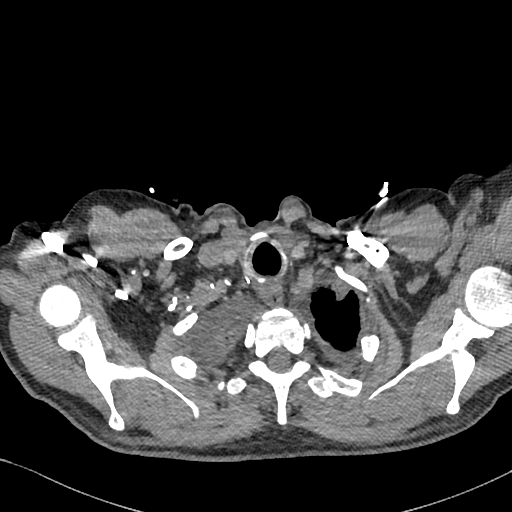
[im 420/445  lung]
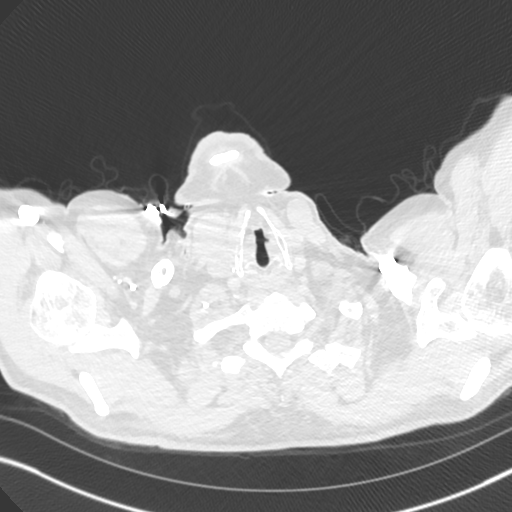

[Series 8: cor · coronal · 0.63mm/px · 3 of 140 slices shown]
[im 35/140  soft-tissue]
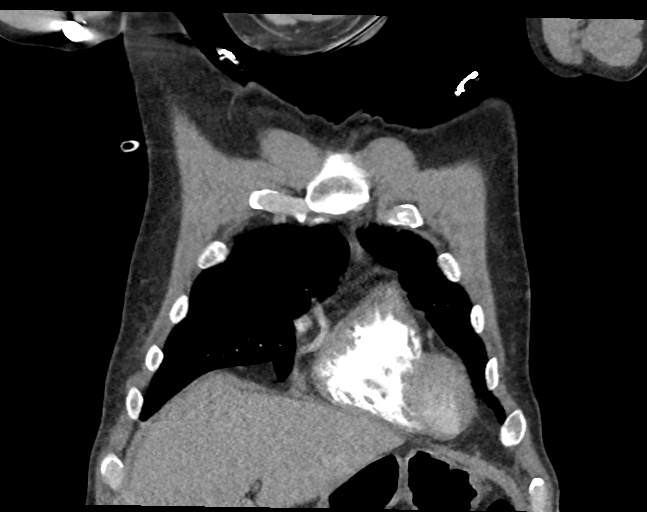
[im 70/140  soft-tissue]
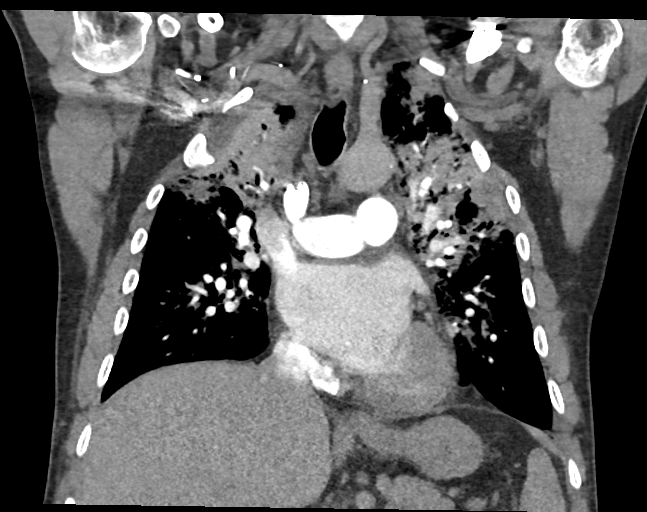
[im 105/140  soft-tissue]
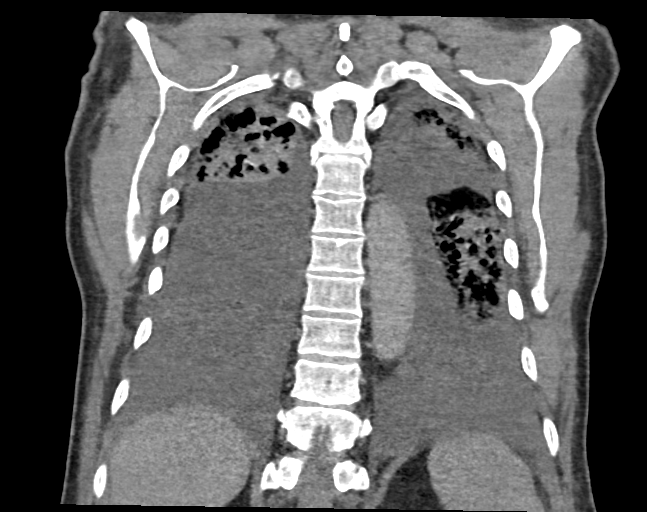

[18 of 46 positions shown; findings below may reference images not displayed]

FINDINGS: Cardiovascular: No filling defects in the pulmonary arteries to
suggest pulmonary emboli. Heart is mildly enlarged. Aorta normal
caliber. Scattered calcifications in the aorta.

Mediastinum/Nodes: Borderline size mediastinal lymph nodes
diffusely, likely reactive. Trachea and esophagus are unremarkable.
Thyroid unremarkable.

Lungs/Pleura: Extensive bilateral airspace disease, worsening
significantly since prior study particularly in the mid and upper
lung zones. Large bilateral pleural effusions.

Upper Abdomen: Imaging into the upper abdomen demonstrates no acute
findings.

Musculoskeletal: Chest wall soft tissues are unremarkable. No acute
bony abnormality.

Review of the MIP images confirms the above findings.
IMPRESSION: Extensive bilateral airspace disease, particularly in the mid and
upper lung fields most compatible with pneumonia or ARDS.

Large bilateral pleural effusions.

Cardiomegaly.

No evidence of pulmonary embolus.

## 2021-07-31 IMAGING — CR DG CHEST 1V
1 series · 1 of 1 positions shown · non-contrast
Comparison: CT 03/12/2021.  Chest x-ray 03/08/2021.

CLINICAL DATA: Status post left thoracentesis.

EXAM:
CHEST  1 VIEW

[chest ap]
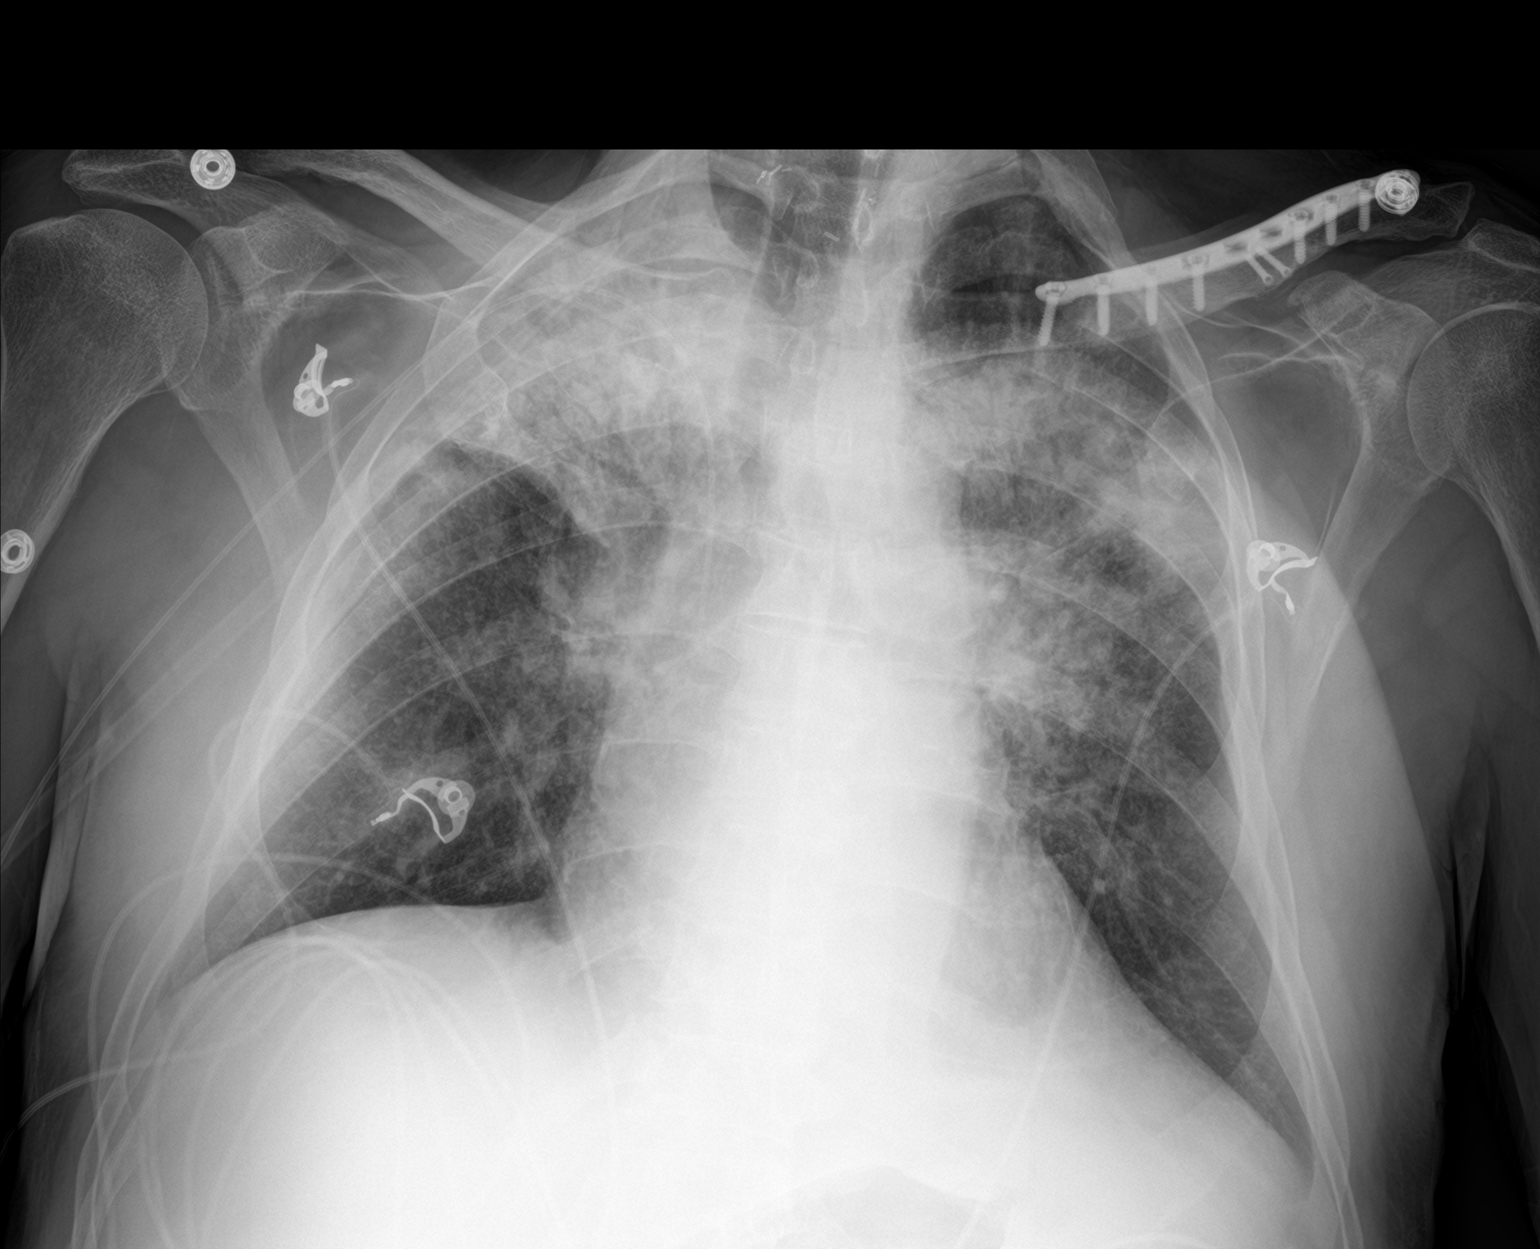

[1 of 1 positions shown; findings below may reference images not displayed]

FINDINGS: Stable cardiomegaly. Bilateral prominent pulmonary infiltrates,
predominantly in the upper lobes, are again noted. Tiny bilateral
pleural effusions cannot be excluded. Very tiny questionable left
apical pneumothorax noted following thoracentesis. Postsurgical
changes left clavicle. Surgical clips noted over the neck.
IMPRESSION: 1. Very questionable tiny left apical pneumothorax post
thoracentesis.

2. Persistent prominent bilateral pulmonary infiltrates,
predominantly in the upper lobes, again noted.

Critical Value/emergent results were called by telephone at the time
of interpretation on 03/13/2021 at [DATE] to provider ABOU RIAD ALIBOUCH
, who verbally acknowledged these results.

## 2021-07-31 IMAGING — US IR THORACENTESIS ASP PLEURAL SPACE W/IMG GUIDE
1 series · 5 of 5 positions shown · non-contrast
Comparison: none

INDICATION: COVID pneumonia. Shortness of breath. Right-sided pleural effusion.
Request for diagnostic therapeutic thoracentesis.

[Series 1: ir (id) (id)/(id)/(id) ir · 5 acquisitions, 5 frames shown]
[im 1/5]
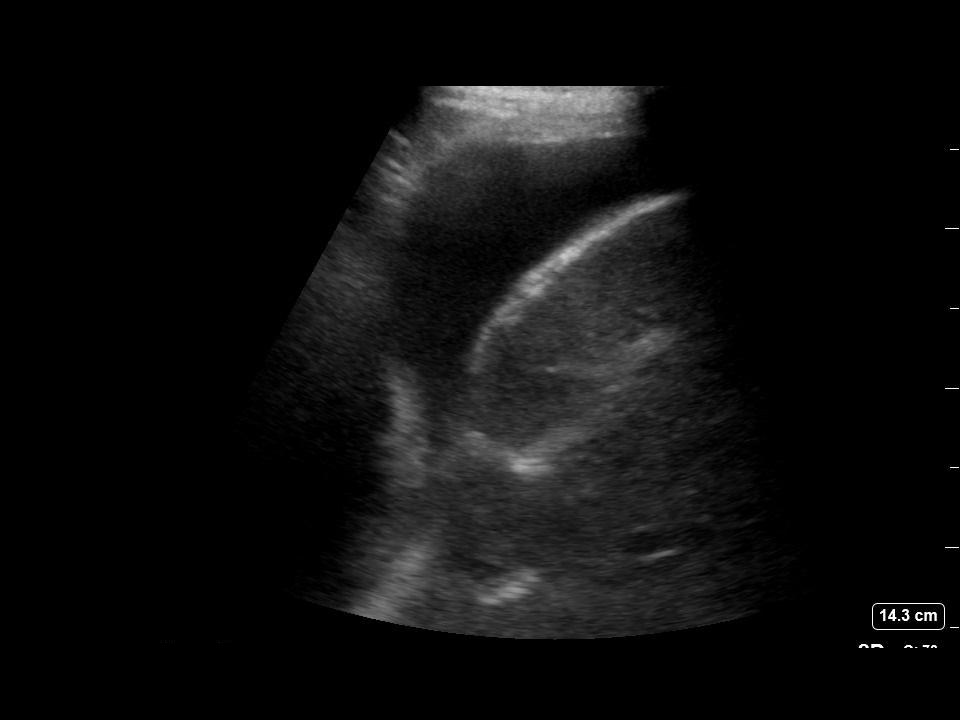
[im 2/5]
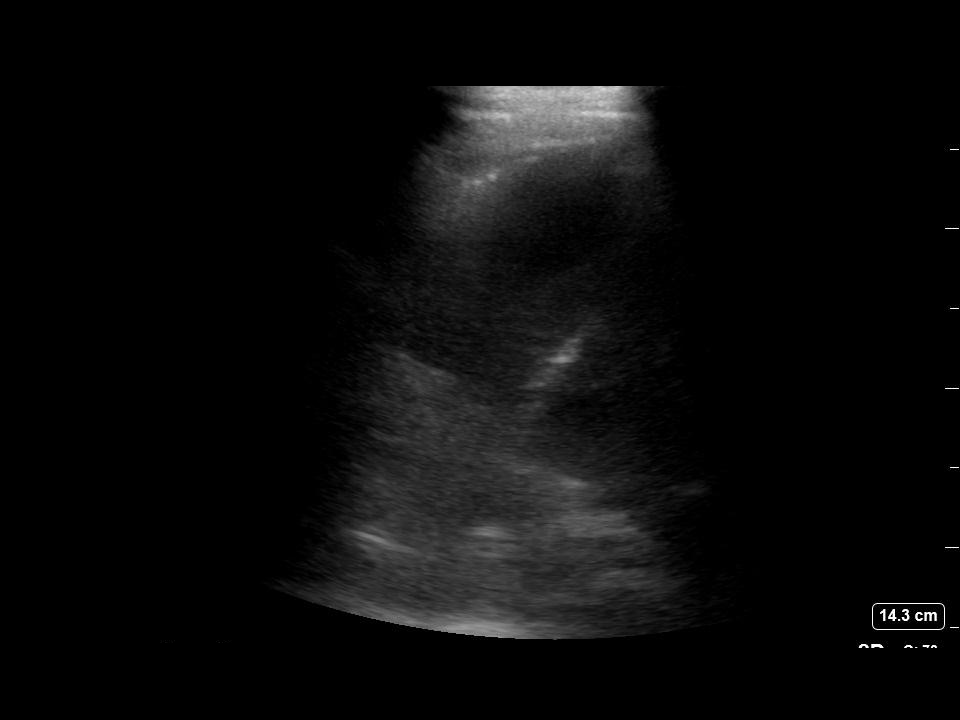
[im 3/5]
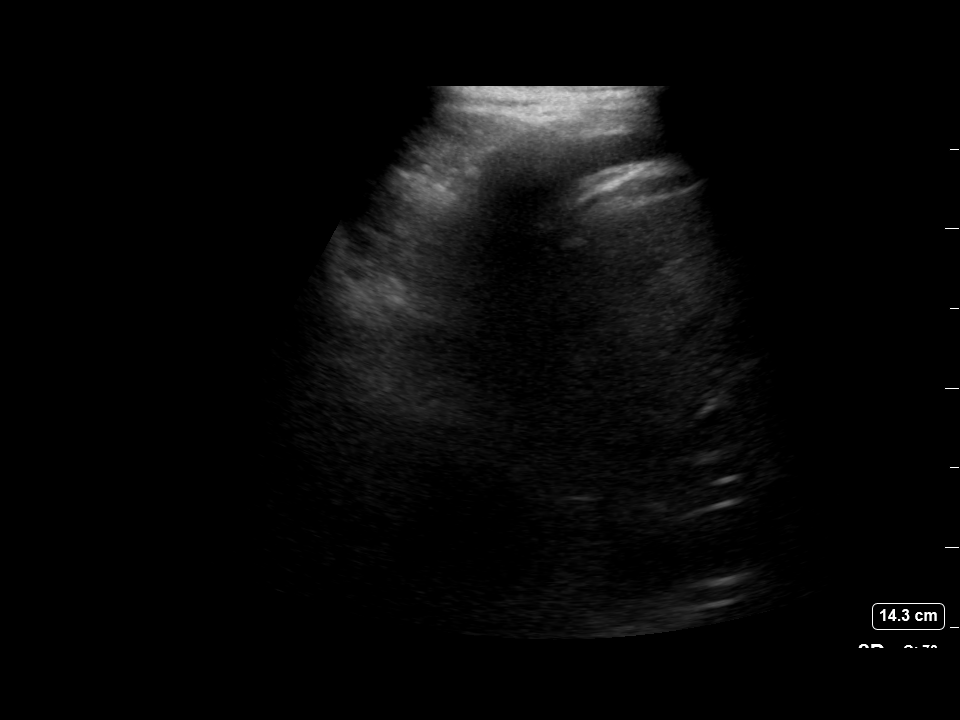
[im 4/5]
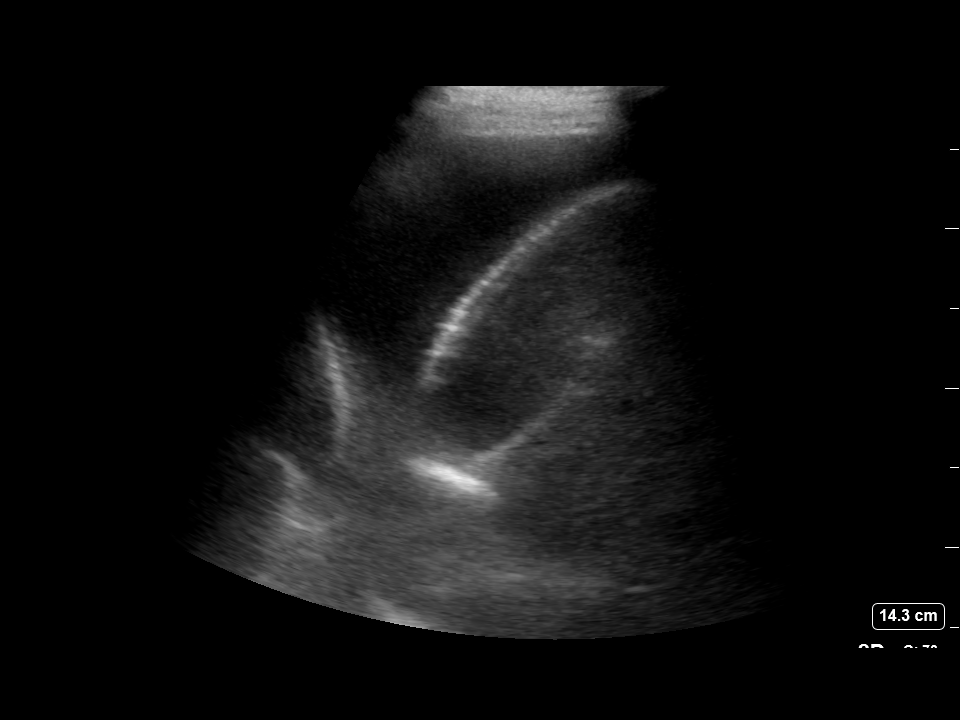
[im 5/5]
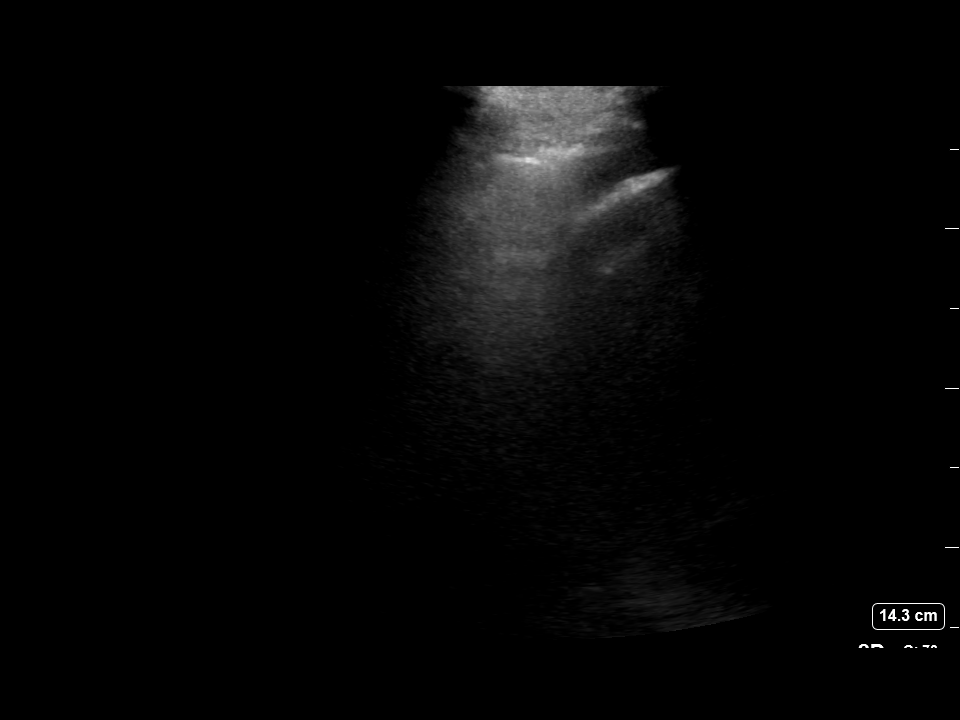

[5 of 5 positions shown; findings below may reference images not displayed]

EXAM:
ULTRASOUND GUIDED RIGHT THORACENTESIS

MEDICATIONS:
1% plain lidocaine, 8 mL

COMPLICATIONS:
None immediate.

PROCEDURE:
An ultrasound guided thoracentesis was thoroughly discussed with the
patient and questions answered. The benefits, risks, alternatives
and complications were also discussed. The patient understands and
wishes to proceed with the procedure. Written consent was obtained.

Ultrasound was performed to localize and mark an adequate pocket of
fluid in the right chest. The area was then prepped and draped in
the normal sterile fashion. 1% Lidocaine was used for local
anesthesia. Under ultrasound guidance a 6 Fr Safe-T-Centesis
catheter was introduced. Thoracentesis was performed. The catheter
was removed and a dressing applied.
FINDINGS: A total of approximately 420 mL of slightly hazy yellow fluid was
removed. Samples were sent to the laboratory as requested by the
clinical team.
IMPRESSION: Successful ultrasound guided right thoracentesis yielding 420 mL of
pleural fluid.

## 2021-08-06 ENCOUNTER — Encounter: Payer: Self-pay | Admitting: Cardiology

## 2021-08-06 ENCOUNTER — Telehealth: Payer: Self-pay | Admitting: Cardiovascular Disease

## 2021-08-06 ENCOUNTER — Encounter: Payer: Self-pay | Admitting: *Deleted

## 2021-08-06 ENCOUNTER — Ambulatory Visit: Payer: Medicare Other | Admitting: Cardiology

## 2021-08-06 ENCOUNTER — Other Ambulatory Visit: Payer: Self-pay

## 2021-08-06 VITALS — BP 120/70 | HR 88 | Ht 73.0 in | Wt 203.0 lb

## 2021-08-06 DIAGNOSIS — D509 Iron deficiency anemia, unspecified: Secondary | ICD-10-CM | POA: Insufficient documentation

## 2021-08-06 DIAGNOSIS — Z01818 Encounter for other preprocedural examination: Secondary | ICD-10-CM

## 2021-08-06 DIAGNOSIS — I480 Paroxysmal atrial fibrillation: Secondary | ICD-10-CM | POA: Diagnosis not present

## 2021-08-06 DIAGNOSIS — Z79899 Other long term (current) drug therapy: Secondary | ICD-10-CM | POA: Diagnosis not present

## 2021-08-06 DIAGNOSIS — Z7901 Long term (current) use of anticoagulants: Secondary | ICD-10-CM

## 2021-08-06 DIAGNOSIS — D508 Other iron deficiency anemias: Secondary | ICD-10-CM

## 2021-08-06 DIAGNOSIS — I483 Typical atrial flutter: Secondary | ICD-10-CM

## 2021-08-06 DIAGNOSIS — Z01812 Encounter for preprocedural laboratory examination: Secondary | ICD-10-CM

## 2021-08-06 MED ORDER — ASPIRIN EC 81 MG PO TBEC: 81.0000 mg | DELAYED_RELEASE_TABLET | Freq: Every day | ORAL | 3 refills | Status: AC

## 2021-08-06 MED ORDER — APIXABAN 5 MG PO TABS: 5.0000 mg | ORAL_TABLET | Freq: Two times a day (BID) | ORAL | 6 refills | Status: DC

## 2021-08-06 NOTE — Assessment & Plan Note (Signed)
Noted by Lourena Simmonds and ECG on 08/06/21. In 05/2021 post op was NSR.  During that clinic visit his Eliquis and amiodarone was appropriately stopped.  He has a left atrial appendage clipping.  Today however typical flutter is noted heart rate 80 bpm.  He is asymptomatic with this. - We will go ahead and set him up for a cardioversion in 3 weeks since he is asymptomatic. - We will restart his Eliquis 5 mg twice a day.  He is not extremely pleased about this because he was in the donut hole previously.  Explained that we will need this medication if we are going to successfully cardiovert him. - I asked him to continue to check his Kardia device as there is a chance that he may auto convert.  If he does convert, we I have asked him to call so that we can cancel the cardioversion. - A week post cardioversion, we will have him follow-up with the atrial fibrillation clinic.  If typical flutter were to return, ablative therapy would be an option.  Discussed briefly with Dr. Johney Frame here in the clinic. - We will avoid restarting amiodarone at this time.

## 2021-08-06 NOTE — Assessment & Plan Note (Signed)
Restarting Eliquis 5 mg twice a day for typical atrial flutter on 08/06/2021.  He does have left atrial appendage clipping.

## 2021-08-06 NOTE — Progress Notes (Signed)
Cardiology Office Note:    Date:  08/06/2021   ID:  ADEM Brandon [name], DOB October 27, 1951, MRN [id number]  PCP:  Brandon Pilon, FNP   [facility] Providers Cardiologist:  None     Referring MD: Brandon Pilon, FNP     History of Present Illness:    Brandon [name] is a 70 y.o. male for evaluation of Atrial fibrillation. Patient called today after having several episodes of irregular rhythm and A. Fib detected on Kardia monitor at 82 BPM.    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 Finished course of antibiotics mid June under direction of ID at [facility] and [facility] line removed  Patient also has Parkinson's disease and is not on any medication to manage the symptoms. He had a consult on 06/11/2021 with Brandon Brandon [name] for post-op evaluation.  Post op TEE 03/30/21 with small remnant LA appendage post clip and removal no MR no MS mean gradient 4 mmHg normal function bioprosthetic valve. He is off of amiodarone and Eliquis.   Today, he mentions that yesterday was the first time his monitor has registered A-fib. However, he mentions he does not feel it when he has these episodes. He also mentions that he has been having some stomach problems and wonders if they are related.  He denies chest pain, shortness of breath, palpitations, lightheadedness, headaches, syncope, LE edema, orthopnea, PND.   Past Medical History:  Diagnosis Date   Anemia    Atrial fibrillation (HCC)    Bacteremia due to Enterococcus    BPH (benign prostatic hyperplasia)    Cervical spine disease    Chest pain 2007   none since   COVID    Endocarditis of mitral valve    Gallbladder disorder    Hemopneumothorax on left    Hiatal hernia    Hypercholesteremia    Hyperlipidemia    Hyperthyroidism    Hypoxia    Meniere's disease    Parkinson disease (HCC)    Pneumothorax on right    PONV (postoperative nausea and vomiting)    also has  trouble waking up   S/P MVR (mitral valve replacement)    SOB (shortness of breath)     Past Surgical History:  Procedure Laterality Date   CARDIAC CATHETERIZATION     COLONOSCOPY     IR THORACENTESIS ASP PLEURAL SPACE W/IMG GUIDE  03/13/2021   ORIF CLAVICULAR FRACTURE Left 04/18/2016   Procedure: OPEN REDUCTION INTERNAL FIXATION (ORIF) LEFT CLAVICLE FRACTURE;  Surgeon: Brandon Hanly, MD;  Location: [facility] OR;  Service: Orthopedics;  Laterality: Left;   RIGHT/LEFT HEART CATH AND CORONARY ANGIOGRAPHY N/A 03/16/2021   Procedure: RIGHT/LEFT HEART CATH AND CORONARY ANGIOGRAPHY;  Surgeon: Brandon Patty, MD;  Location: [facility] INVASIVE CV LAB;  Service: Cardiovascular;  Laterality: N/A;   TEE WITHOUT CARDIOVERSION N/A 03/14/2021   Procedure: TRANSESOPHAGEAL ECHOCARDIOGRAM (TEE);  Surgeon: Brandon Stade, MD;  Location: [facility] ENDOSCOPY;  Service: Cardiovascular;  Laterality: N/A;   THYROIDECTOMY     TONSILLECTOMY      Current Medications: Current Meds  Medication Sig   apixaban (ELIQUIS) 5 MG TABS tablet Take 1 tablet (5 mg total) by mouth 2 (two) times daily.   aspirin EC 81 MG tablet Take 1 tablet (81 mg total) by mouth daily. Swallow whole.   cholecalciferol (VITAMIN D) 1000 units tablet Take 1,000 Units by mouth daily.   Cyanocobalamin (VITAMIN B-12 PO) Take by mouth.  fluticasone (FLONASE) 50 MCG/ACT nasal spray Place 1 spray into both nostrils daily as needed for allergies.   levothyroxine (SYNTHROID) 175 MCG tablet Take 175 mcg by mouth daily.   Multiple Vitamin (MULTIVITAMIN WITH MINERALS) TABS tablet Take 1 tablet by mouth daily.   PROBIOTIC, LACTOBACILLUS, PO Take by mouth.   TESTOSTERONE CYPIONATE IM Inject 200 mg into the muscle once a week.   traZODone (DESYREL) 50 MG tablet Take 50 mg by mouth at bedtime.   [DISCONTINUED] aspirin EC 81 MG tablet Take 81 mg by mouth daily. Swallow whole.     Allergies:   Ceftin [cefuroxime axetil], Oxycodone, and Iohexol   Social History    Socioeconomic History   Marital status: Widowed    Spouse name: Not on file   Number of children: Not on file   Years of education: Not on file   Highest education level: Not on file  Occupational History    Comment: factory work  Tobacco Use   Smoking status: Former    Types: Cigarettes    Quit date: 04/18/1979    Years since quitting: 42.3   Smokeless tobacco: Never  Vaping Use   Vaping Use: Never used  Substance and Sexual Activity   Alcohol use: Yes    Comment: 3 timesa a week (wine or beer)   Drug use: No   Sexual activity: Not on file  Other Topics Concern   Not on file  Social History Narrative   Two level home alone   Right handed   Caffeine - one cup coffee am    Exercise - yes walk dogs   Education - 1 year of college    Social Determinants of Health   Financial Resource Strain: Not on file  Food Insecurity: Not on file  Transportation Needs: Not on file  Physical Activity: Not on file  Stress: Not on file  Social Connections: Not on file     Family History: The patient's family history includes Breast cancer in his mother; Emphysema in his father; Heart attack in his brother and sister; Heart disease in his father; Hypertension in his mother.  ROS:   Please see the history of present illness.     All other systems reviewed and are negative.  EKGs/Labs/Other Studies Reviewed:    The following studies were reviewed today:  EKG:    08/06/2021: typical flutter 88 bpm  Echo 03/04/2021:   1. There is a mass present on the anterior mitral valve leaflet  concerning for a vegetation that measures 1.4x1.0cm. Suspect moderate mitral regurgitation with two separate jets that are posteriorly and anteriorly directed raising concern for possible  leaflet perforation.The mean gradient across the MV is 8mmHg (HR 100) with MVA by continuity of 1.35cm2 suggesting at least mild mitral stenosis in the setting of the mitral valve vegetation as detailed above.  Recommend TEE for further evaluation.   2. Left ventricular ejection fraction, by estimation, is 60 to 65%. The left ventricle has normal function. The left ventricle has no regional wall motion abnormalities. There is moderate asymmetric left ventricular hypertrophy of the basal-septal  segment. There is mild LVH of the rest of the LV segments. Left  ventricular diastolic parameters are consistent with Grade I diastolic dysfunction (impaired relaxation).   3. Right ventricular systolic function is normal. The right ventricular size is normal. There is severely elevated pulmonary artery systolic pressure. The estimated right ventricular systolic pressure is 71.0 mmHg.   4. The aortic valve is tricuspid. There  is mild calcification of the  aortic valve. There is moderate thickening of the aortic valve. Aortic valve regurgitation is mild. Mild to moderate aortic valve  sclerosis/calcification is present, without any  evidence of aortic stenosis. No distinct vegetations visualized, but the valve is thickened.   5. The inferior vena cava is normal in size with greater than 50% respiratory variability, suggesting right atrial pressure of 3 mmHg.  Coronary Angio 03/16/2021:  1. Mild non-obstructive CAD 2. Normal EF 60% 3. Severe v-waves in PCW tracing in setting of known severe MR 4. COVID PNA  TEE 03/14/2021:  1. Left ventricular ejection fraction, by estimation, is 55 to 60%. The left ventricle has normal function.   2. Right ventricular systolic function is normal. The right ventricular size is normal.   3. Left atrial size was mildly dilated. No left atrial/left atrial  appendage thrombus was detected.   4. Flail A2 segment with perforation at the base of A2 and large  vegetation 2.2 cm on ventricular side extending into papillary muscle. Intervalvular fibrosa intact Also appears to be small vegetation on P2. Findings discussed with CVTS Brandon Dorris Fetch. Extensive 3D imaging of MV was performed  . The mitral valve is abnormal. 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   5. The aortic valve is tricuspid. There is mild calcification of the  aortic valve. Aortic valve regurgitation is trivial. Mild aortic valve  sclerosis is present, with no evidence of aortic valve stenosis.  Recent Labs: 03/04/2021: TSH 2.550 03/12/2021: ALT 38 03/13/2021: B Natriuretic Peptide 331.0 03/15/2021: BUN 33; Creatinine, Ser 0.96 03/16/2021: Hemoglobin 10.9; Magnesium 2.0; Platelets 305; Potassium 3.6; Sodium 136  Recent Lipid Panel No results found for: CHOL, TRIG, HDL, CHOLHDL, VLDL, LDLCALC, LDLDIRECT         Physical Exam:    VS:  BP 120/70 (BP Location: Left Arm, Patient Position: Sitting, Cuff Size: Normal)   Pulse 88   Ht  (1.854 m)   Wt 203 lb (92.1 kg)   SpO2 96%   BMI 26.78 kg/m     Wt Readings from Last 3 Encounters:  08/06/21 203 lb (92.1 kg)  07/26/21 198 lb 9.6 oz (90.1 kg)  06/11/21 189 lb 12.8 oz (86.1 kg)     GEN:  Well nourished, well developed in no acute distress HEENT: Normal NECK: No JVD; No carotid bruits LYMPHATICS: No lymphadenopathy CARDIAC: irreg, no murmurs, rubs, gallops RESPIRATORY:  Clear to auscultation without rales, wheezing or rhonchi  ABDOMEN: Soft, non-tender, non-distended MUSCULOSKELETAL:  No edema; No deformity  SKIN: Warm and dry NEUROLOGIC:  Alert and oriented x 3 PSYCHIATRIC:  Normal affect   ASSESSMENT:    1. PAF (paroxysmal atrial fibrillation) (HCC)   2. Pre-procedure lab exam   3. Medication management   4. Typical atrial flutter (HCC)   5. Chronic anticoagulation   6. Other iron deficiency anemia    PLAN:    In order of problems listed above:  Typical atrial flutter (HCC) Noted by Kardia and ECG on 08/06/21. In 05/2021 post op was NSR.  During that clinic visit his Eliquis and amiodarone was appropriately stopped.  He has a left atrial appendage clipping.  Today however typical flutter is noted heart rate 80 bpm.  He is  asymptomatic with this. - We will go ahead and set him up for a cardioversion in 3 weeks since he is asymptomatic. - We will restart his Eliquis 5 mg twice a day.  He is not extremely pleased about this because  he was in the donut hole previously.  Explained that we will need this medication if we are going to successfully cardiovert him. - I asked him to continue to check his Kardia device as there is a chance that he may auto convert.  If he does convert, we I have asked him to call so that we can cancel the cardioversion. - A week post cardioversion, we will have him follow-up with the atrial fibrillation clinic.  If typical flutter were to return, ablative therapy would be an option.  Discussed briefly with Brandon. Johney Frame here in the clinic. - We will avoid restarting amiodarone at this time.  Chronic anticoagulation Restarting Eliquis 5 mg twice a day for typical atrial flutter on 08/06/2021.  He does have left atrial appendage clipping.  Iron deficiency anemia Checking CBC prior to cardioversion/Eliquis reinitiation.   Restart Eliquis 2x daily CBCs and BMP Keep checking Kardiac monitor   Shared Decision Making/Informed Consent The risks (stroke, cardiac arrhythmias rarely resulting in the need for a temporary or permanent pacemaker, skin irritation or burns and complications associated with conscious sedation including aspiration, arrhythmia, respiratory failure and death), benefits (restoration of normal sinus rhythm) and alternatives of a direct current cardioversion were explained in detail to Mr. Broski and he agrees to proceed.    Follow-up prn  Medication Adjustments/Labs and Tests Ordered: Current medicines are reviewed at length with the patient today.  Concerns regarding medicines are outlined above.  Orders Placed This Encounter  Procedures   CBC   Basic metabolic panel   EKG 12-Lead    Meds ordered this encounter  Medications   apixaban (ELIQUIS) 5 MG TABS tablet     Sig: Take 1 tablet (5 mg total) by mouth 2 (two) times daily.    Dispense:  60 tablet    Refill:  6   aspirin EC 81 MG tablet    Sig: Take 1 tablet (81 mg total) by mouth daily. Swallow whole.    Dispense:  90 tablet    Refill:  3     Patient Instructions  Medication Instructions:  Please start Eliquis 5 mg one tablet twice a day. Continue all other medications as listed.  *If you need a refill on your cardiac medications before your next appointment, please call your pharmacy*  Lab Work: Please have blood work today (CBC, BMP)  If you have labs (blood work) drawn today and your tests are completely normal, you will receive your results only by: MyChart Message (if you have MyChart) OR A paper copy in the mail If you have any lab test that is abnormal or we need to change your treatment, we will call you to review the results.  Testing/Procedures: Your physician has requested that you have a Cardioversion in 3 weeks.  Electrical Cardioversion uses a jolt of electricity to your heart either through paddles or wired patches attached to your chest. This is a controlled, usually prescheduled, procedure. This procedure is done at the hospital and you are not awake during the procedure. You usually go home the day of the procedure. Please see the instruction sheet given to you today for more information.  Follow-Up: At [facility], you and your health needs are our priority.  As part of our continuing mission to provide you with exceptional heart care, we have created designated Provider Care Teams.  These Care Teams include your primary Cardiologist (physician) and Advanced Practice Providers (APPs -  Physician Assistants and Nurse Practitioners) who all work together  to provide you with the care you need, when you need it.  We recommend signing up for the patient portal called "MyChart".  Sign up information is provided on this After Visit Summary.  MyChart is used to connect with  patients for Virtual Visits (Telemedicine).  Patients are able to view lab/test results, encounter notes, upcoming appointments, etc.  Non-urgent messages can be sent to your provider as well.   To learn more about what you can do with MyChart, go to [email or web address].    Your next appointment:  Will be in the At [facility] 1 week after your cardioversion.  AFIB CLINIC INFORMATION: Your appointment is scheduled on:    09/05/2021 at  10AM  .   Please arrive 15 minutes early for check-in. The AFib Clinic is located in the Heart and Vascular Specialty Clinics at [facility]. Parking instructions/directions: Government social research officer C ([address]). When you pull in to Entrance C, there is an underground parking garage to your right. The code to enter the garage is 3333. Take the elevators to the first floor. Follow the signs to the Heart and Vascular Specialty Clinics. You will see registration at the end of the hallway.  Phone number: [phone]   Thank you for choosing [facility]!!      I,Zite Okoli,acting as a scribe for Donato Schultz, MD.,have documented all relevant documentation on the behalf of Donato Schultz, MD,as directed by  Donato Schultz, MD while in the presence of Donato Schultz, MD.   I, Donato Schultz, MD, have reviewed all documentation for this visit. The documentation on 08/06/21 for the exam, diagnosis, procedures, and orders are all accurate and complete.   Signed, Donato Schultz, MD  08/06/2021 3:31 PM    [facility]

## 2021-08-06 NOTE — Telephone Encounter (Signed)
Patient stated he started having stomach issues on Saturday, he had lots of burping and feeling very gassy, which is still going on. Patient is not sure if this brought on his irregular rhythm. Patient stated yesterday his Kardia monitor, showed atrial fib. HR 82. Patient stated his friend felt his pulse and stated it was irregular. Asked patient about his rhythm today and he stated he does not have the [place] with him and he would have to check it when he is back at home. Patient stated his friend checked his pulse and it feels regular today. Patient stated he can never feel when he is in A. FIB. Went ahead and scheduled patient an appointment to get an EKG and get evaluated for A. FIB today with DOD. Will forward to Dr. Eden Emms for further advisement.

## 2021-08-06 NOTE — Patient Instructions (Addendum)
Medication Instructions:  Please start Eliquis 5 mg one tablet twice a day. Continue all other medications as listed.  *If you need a refill on your cardiac medications before your next appointment, please call your pharmacy*  Lab Work: Please have blood work today (CBC, BMP)  If you have labs (blood work) drawn today and your tests are completely normal, you will receive your results only by: MyChart Message (if you have MyChart) OR A paper copy in the mail If you have any lab test that is abnormal or we need to change your treatment, we will call you to review the results.  Testing/Procedures: Your physician has requested that you have a Cardioversion in 3 weeks.  Electrical Cardioversion uses a jolt of electricity to your heart either through paddles or wired patches attached to your chest. This is a controlled, usually prescheduled, procedure. This procedure is done at the hospital and you are not awake during the procedure. You usually go home the day of the procedure. Please see the instruction sheet given to you today for more information.  Follow-Up: At [facility], you and your health needs are our priority.  As part of our continuing mission to provide you with exceptional heart care, we have created designated Provider Care Teams.  These Care Teams include your primary Cardiologist (physician) and Advanced Practice Providers (APPs -  Physician Assistants and Nurse Practitioners) who all work together to provide you with the care you need, when you need it.  We recommend signing up for the patient portal called "MyChart".  Sign up information is provided on this After Visit Summary.  MyChart is used to connect with patients for Virtual Visits (Telemedicine).  Patients are able to view lab/test results, encounter notes, upcoming appointments, etc.  Non-urgent messages can be sent to your provider as well.   To learn more about what you can do with MyChart, go to  [email or web address].    Your next appointment:  Will be in the At [facility] 1 week after your cardioversion.  AFIB CLINIC INFORMATION: Your appointment is scheduled on:    09/05/2021 at  10AM  .   Please arrive 15 minutes early for check-in. The AFib Clinic is located in the Heart and Vascular Specialty Clinics at [facility]. Parking instructions/directions: Government social research officer C ([address]). When you pull in to Entrance C, there is an underground parking garage to your right. The code to enter the garage is 3333. Take the elevators to the first floor. Follow the signs to the Heart and Vascular Specialty Clinics. You will see registration at the end of the hallway.  Phone number: [phone]   Thank you for choosing [facility]!!

## 2021-08-06 NOTE — Addendum Note (Signed)
Addended by: Jake Bathe on: 08/06/2021 07:23 PM   Modules accepted: Orders, SmartSet

## 2021-08-06 NOTE — Assessment & Plan Note (Signed)
Checking CBC prior to cardioversion/Eliquis reinitiation.

## 2021-08-06 NOTE — Telephone Encounter (Signed)
Patient c/o Palpitations:  High priority if patient c/o lightheadedness, shortness of breath, or chest pain  How long have you had palpitations/irregular HR/ Afib? Are you having the symptoms now? yes  Are you currently experiencing lightheadedness, SOB or CP? no  Do you have a history of afib (atrial fibrillation) or irregular heart rhythm? yes  Have you checked your BP or HR? (document readings if available): no  Are you experiencing any other symptoms? Sweating and really gases

## 2021-08-07 LAB — CBC
Hematocrit: 45.3 % (ref 37.5–51.0)
Hemoglobin: 14.7 g/dL (ref 13.0–17.7)
MCH: 29.3 pg (ref 26.6–33.0)
MCHC: 32.5 g/dL (ref 31.5–35.7)
MCV: 90 fL (ref 79–97)
Platelets: 283 10*3/uL (ref 150–450)
RBC: 5.02 x10E6/uL (ref 4.14–5.80)
RDW: 13.2 % (ref 11.6–15.4)
WBC: 7.2 10*3/uL (ref 3.4–10.8)

## 2021-08-07 LAB — BASIC METABOLIC PANEL
BUN/Creatinine Ratio: 16 (ref 10–24)
BUN: 19 mg/dL (ref 8–27)
CO2: 25 mmol/L (ref 20–29)
Calcium: 9 mg/dL (ref 8.6–10.2)
Chloride: 99 mmol/L (ref 96–106)
Creatinine, Ser: 1.2 mg/dL (ref 0.76–1.27)
Glucose: 82 mg/dL (ref 65–99)
Potassium: 4.9 mmol/L (ref 3.5–5.2)
Sodium: 138 mmol/L (ref 134–144)
eGFR: 65 mL/min/{1.73_m2} (ref >59)

## 2021-08-09 ENCOUNTER — Telehealth: Payer: Self-pay | Admitting: Cardiology

## 2021-08-09 NOTE — Telephone Encounter (Signed)
   Pt c/o medication issue:  1. Name of Medication: apixaban (ELIQUIS) 5 MG TABS tablet  2. How are you currently taking this medication (dosage and times per day)? Take 1 tablet (5 mg total) by mouth 2 (two) times daily.  3. Are you having a reaction (difficulty breathing--STAT)?   4. What is your medication issue? Pt would like to ask Dr. Anne Fu about his new meds eliquis, he said he saw on the instruction to call about drinking alcohol

## 2021-08-09 NOTE — Telephone Encounter (Signed)
Spoke with the pt and he is asking what's acceptable when drinking alcohol and taking Eliquis.  Spoke with our Pharmacist Malena Peer about this and below is recommendations to provide the pt, about consuming alcohol and taking Eliquis.   Informed the pt that per Melissa, ideally we would like for him to have NO alcohol at all.  Informed him that if he is going to drink alcohol, he should have no more than 2 drinks, ideally only 1, and not everyday. Informed the pt that a standard drink is considered a shot of liquor, a 12 oz 5% beer, or 5 oz of wine.  Pt states he only drinks a glass of wine occasionally, and will follow guidelines as endorsed to him. Pt verbalized understanding and agrees with this plan. Pt was more than gracious for all the assistance provided.

## 2021-08-16 ENCOUNTER — Telehealth: Payer: Self-pay | Admitting: Cardiology

## 2021-08-16 MED ORDER — METOPROLOL TARTRATE 25 MG PO TABS: 12.5000 mg | ORAL_TABLET | Freq: Two times a day (BID) | ORAL | 3 refills | Status: DC

## 2021-08-16 NOTE — Telephone Encounter (Signed)
STAT if HR is under 50 or over 120 (normal HR is 60-100 beats per minute)  What is your heart rate? 133 pt is sitting  Do you have a log of your heart rate readings (document readings)? Pt checks his HR everyday but he does not keep a log  Do you have any other symptoms? No symptoms

## 2021-08-16 NOTE — Telephone Encounter (Signed)
Call sent straight to triage. Patient's HR is elevated at 133 and has no sign of coming down. Patient has new dx of A. FIB and is on eliquis. Patient stated he was in the yard working this morning and he has been fine until this afternoon when he noticed his HR was up. Patient not on any rate control medications. Will consult DOD, Dr. Elease Hashimoto. He advised patient to hydrate and replenish his electrolytes with a V8 juice or Gatorade zero, and start him on metoprolol 12.5 mg BID. Called patient back with advisement. Patient verbalized understanding. Patient will call back if he has any other concerns or questions.

## 2021-08-17 NOTE — Telephone Encounter (Signed)
  Pt said his HR is back to normal now and wanted to speak with Pam again

## 2021-08-17 NOTE — Telephone Encounter (Addendum)
I spoke with patient. He took lopressor last night. Today his heart rate is 67.  Has not taken lopressor yet.  He reports Kardia monitor shows sinus rhythm now.  He is not able to send in strip through my chart. He states yesterday he took erectile dysfunction medication prescribed by urology. He notes side effect is elevated heart rate.  He does not have medication with him and is unsure of the name. I advised patient to continue current medications. He will let us know if heart rate consistently running less than 60. I told patient I would not cancel cardioversion yet in case he went back into afib but that I would send update to Dr Anne Fu. Patient will continue to monitor with kardia monitor

## 2021-08-20 ENCOUNTER — Telehealth: Payer: Self-pay | Admitting: Cardiology

## 2021-08-20 ENCOUNTER — Encounter (HOSPITAL_COMMUNITY): Payer: Self-pay | Admitting: Cardiology

## 2021-08-20 NOTE — Telephone Encounter (Signed)
Spoke to the patient. He wanted to note that he missed a few doses of metoprolol and went into Afib this week. He missed them because they were upsetting his stomach He realized he should have been taking them with food. Started back with food and he went back out of Afib. He is having no trouble with afib now or the medication. Has not missed any doses of Eliquis. Educated about compliance with medications.  Verbalized understanding.

## 2021-08-20 NOTE — Telephone Encounter (Signed)
Patient called in asking that the nurse give him a call. Please advise

## 2021-08-22 ENCOUNTER — Telehealth: Payer: Self-pay | Admitting: Cardiology

## 2021-08-22 NOTE — Telephone Encounter (Signed)
Patient called in to say that he tested positive for covid on Today. He is calling to see if he would need to reschedule his procedure that on 10/5. Please advise

## 2021-08-22 NOTE — Telephone Encounter (Signed)
Patient is able to schedule anytime after 09/02/21 for his cardioversion. Patient tested positive today for Covid, and he will have to wait 10 days after the second day from his positive test. Will send message to Dr. Anne Fu nurse to reschedule.

## 2021-08-23 NOTE — Telephone Encounter (Signed)
Spoke with patient and was able to reschedule his outpt cardioversion to Monday 10/10 at 9:30 with Dr Eden Emms.  Pt aware to arrive at Main Entrance A at 8:30 am.  He had no further questions or concerns at the time of the call.

## 2021-08-23 NOTE — Telephone Encounter (Signed)
Dr Anne Fu has reviewed this information.  No new orders at this time.

## 2021-08-29 ENCOUNTER — Ambulatory Visit: Payer: Medicare Other | Admitting: Cardiovascular Disease

## 2021-08-29 ENCOUNTER — Other Ambulatory Visit: Payer: Self-pay | Admitting: Cardiology

## 2021-08-31 ENCOUNTER — Other Ambulatory Visit (HOSPITAL_BASED_OUTPATIENT_CLINIC_OR_DEPARTMENT_OTHER): Payer: Self-pay | Admitting: Cardiology

## 2021-08-31 DIAGNOSIS — I480 Paroxysmal atrial fibrillation: Secondary | ICD-10-CM

## 2021-09-03 ENCOUNTER — Ambulatory Visit (HOSPITAL_COMMUNITY)
Admission: RE | Admit: 2021-09-03 | Discharge: 2021-09-03 | Disposition: A | Discharge status: Home / Self Care | Payer: Medicare Other | Attending: Cardiovascular Disease | Admitting: Cardiovascular Disease

## 2021-09-03 ENCOUNTER — Encounter (HOSPITAL_COMMUNITY)
Admission: RE | Disposition: A | Discharge status: Home / Self Care | Payer: Self-pay | Source: Home / Self Care | Attending: Cardiovascular Disease

## 2021-09-03 ENCOUNTER — Ambulatory Visit (HOSPITAL_COMMUNITY): Payer: Medicare Other | Admitting: Certified Registered Nurse Anesthetist

## 2021-09-03 ENCOUNTER — Encounter (HOSPITAL_COMMUNITY): Payer: Self-pay | Admitting: Cardiovascular Disease

## 2021-09-03 DIAGNOSIS — Z952 Presence of prosthetic heart valve: Secondary | ICD-10-CM | POA: Insufficient documentation

## 2021-09-03 DIAGNOSIS — E7800 Pure hypercholesterolemia, unspecified: Secondary | ICD-10-CM | POA: Diagnosis not present

## 2021-09-03 DIAGNOSIS — Z7901 Long term (current) use of anticoagulants: Secondary | ICD-10-CM | POA: Insufficient documentation

## 2021-09-03 DIAGNOSIS — Z888 Allergy status to other drugs, medicaments and biological substances status: Secondary | ICD-10-CM | POA: Diagnosis not present

## 2021-09-03 DIAGNOSIS — Z87891 Personal history of nicotine dependence: Secondary | ICD-10-CM | POA: Diagnosis not present

## 2021-09-03 DIAGNOSIS — Z8616 Personal history of COVID-19: Secondary | ICD-10-CM | POA: Insufficient documentation

## 2021-09-03 DIAGNOSIS — I4510 Unspecified right bundle-branch block: Secondary | ICD-10-CM | POA: Insufficient documentation

## 2021-09-03 DIAGNOSIS — I10 Essential (primary) hypertension: Secondary | ICD-10-CM | POA: Diagnosis not present

## 2021-09-03 DIAGNOSIS — Z885 Allergy status to narcotic agent status: Secondary | ICD-10-CM | POA: Diagnosis not present

## 2021-09-03 DIAGNOSIS — I4892 Unspecified atrial flutter: Secondary | ICD-10-CM | POA: Diagnosis not present

## 2021-09-03 DIAGNOSIS — Z881 Allergy status to other antibiotic agents status: Secondary | ICD-10-CM | POA: Insufficient documentation

## 2021-09-03 DIAGNOSIS — G20 Parkinson's disease: Secondary | ICD-10-CM | POA: Insufficient documentation

## 2021-09-03 DIAGNOSIS — I4891 Unspecified atrial fibrillation: Secondary | ICD-10-CM | POA: Insufficient documentation

## 2021-09-03 DIAGNOSIS — E0590 Thyrotoxicosis, unspecified without thyrotoxic crisis or storm: Secondary | ICD-10-CM | POA: Insufficient documentation

## 2021-09-03 DIAGNOSIS — I480 Paroxysmal atrial fibrillation: Secondary | ICD-10-CM

## 2021-09-03 HISTORY — PX: CARDIOVERSION: SHX1299

## 2021-09-03 SURGERY — CARDIOVERSION
Anesthesia: General

## 2021-09-03 MED ORDER — SODIUM CHLORIDE 0.9 % IV SOLN: INTRAVENOUS | Status: DC | PRN

## 2021-09-03 MED ORDER — LIDOCAINE 2% (20 MG/ML) 5 ML SYRINGE
INTRAMUSCULAR | Status: DC | PRN
  Administered 2021-09-03: 60 mg via INTRAVENOUS

## 2021-09-03 MED ORDER — PROPOFOL 10 MG/ML IV BOLUS
INTRAVENOUS | Status: DC | PRN
  Administered 2021-09-03: 100 mg via INTRAVENOUS

## 2021-09-03 NOTE — Anesthesia Preprocedure Evaluation (Signed)
Anesthesia Evaluation  Patient identified by MRN, date of birth, ID band Patient awake    Reviewed: Allergy & Precautions, H&P , NPO status , Patient's Chart, lab work & pertinent test results  History of Anesthesia Complications (+) PONV and history of anesthetic complications  Airway Mallampati: II   Neck ROM: full    Dental   Pulmonary former smoker,    breath sounds clear to auscultation       Cardiovascular hypertension, + dysrhythmias Atrial Fibrillation + Valvular Problems/Murmurs  Rhythm:irregular Rate:Normal  H/o mitral valve replacement   Neuro/Psych Parkinson's dz    GI/Hepatic hiatal hernia,   Endo/Other  Hypothyroidism Hyperthyroidism   Renal/GU      Musculoskeletal   Abdominal   Peds  Hematology   Anesthesia Other Findings   Reproductive/Obstetrics                             Anesthesia Physical Anesthesia Plan  ASA: 3  Anesthesia Plan: General   Post-op Pain Management:    Induction: Intravenous  PONV Risk Score and Plan: 3 and Propofol infusion and Treatment may vary due to age or medical condition  Airway Management Planned: Nasal Cannula  Additional Equipment:   Intra-op Plan:   Post-operative Plan:   Informed Consent: I have reviewed the patients History and Physical, chart, labs and discussed the procedure including the risks, benefits and alternatives for the proposed anesthesia with the patient or authorized representative who has indicated his/her understanding and acceptance.     Dental advisory given  Plan Discussed with: CRNA, Anesthesiologist and Surgeon  Anesthesia Plan Comments:         Anesthesia Quick Evaluation

## 2021-09-03 NOTE — CV Procedure (Signed)
DCC: On Rx Eliquis no missed doses Anesthesia: Propofol  DCC x 1 synch biphasic 120 J's  Converted from flutter rate 66 to NSR rate 72 bpm  No immediate neurologic sequelae  Charlton Haws MD [facility]

## 2021-09-03 NOTE — Anesthesia Procedure Notes (Signed)
Procedure Name: General with mask airway Date/Time: 09/03/2021 9:15 AM Performed by: Nils Pyle, CRNA Pre-anesthesia Checklist: Patient identified, Emergency Drugs available, Suction available and Patient being monitored Patient Re-evaluated:Patient Re-evaluated prior to induction Oxygen Delivery Method: Ambu bag Preoxygenation: Pre-oxygenation with 100% oxygen Induction Type: IV induction Placement Confirmation: positive ETCO2 and breath sounds checked- equal and bilateral Dental Injury: Teeth and Oropharynx as per pre-operative assessment

## 2021-09-03 NOTE — H&P (Signed)
CARDIOLOGY CONSULT NOTE       Patient ID: Brandon [name] MRN: [id number] DOB/AGE: December 12, 1950 70 y.o.  Admit date: 09/03/2021 Referring Physician: Anne Fu Primary Physician: Soundra Pilon, FNP Primary Cardiologist: Anne Fu Reason for Consultation: [facility]  Active Problems:   * No active hospital problems. *   HPI:   Brandon [name] is a 70 y.o. 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 Finished course of antibiotics mid June under direction of ID at [facility] and [facility] line removed  Patient also has Parkinson's disease and is not on any medication to manage the symptoms. He had a consult on 06/11/2021 with Dr Eden Emms for post-op evaluation.   Post op TEE 03/30/21 with small remnant LA appendage post clip and removal no MR no MS mean gradient 4 mmHg normal function bioprosthetic valve. He is off of amiodarone and Eliquis.    He denies chest pain, shortness of breath, palpitations, lightheadedness, headaches, syncope, LE edema, orthopnea, PND.  ROS All other systems reviewed and negative except as noted above  Past Medical History:  Diagnosis Date   Anemia    Atrial fibrillation (HCC)    Bacteremia due to Enterococcus    BPH (benign prostatic hyperplasia)    Cervical spine disease    Chest pain 2007   none since   COVID    Endocarditis of mitral valve    Gallbladder disorder    Hemopneumothorax on left    Hiatal hernia    Hypercholesteremia    Hyperlipidemia    Hyperthyroidism    Hypoxia    Meniere's disease    Parkinson disease (HCC)    Pneumothorax on right    PONV (postoperative nausea and vomiting)    also has trouble waking up   S/P MVR (mitral valve replacement)    SOB (shortness of breath)     Family History  Problem Relation Age of Onset   Hypertension Mother    Breast cancer Mother    Heart  disease Father    Emphysema Father    Heart attack Sister    Heart attack Brother     Social History   Socioeconomic History   Marital status: Widowed    Spouse name: Not on file   Number of children: Not on file   Years of education: Not on file   Highest education level: Not on file  Occupational History    Comment: factory work  Tobacco Use   Smoking status: Former    Types: Cigarettes    Quit date: 04/18/1979    Years since quitting: 42.4   Smokeless tobacco: Never  Vaping Use   Vaping Use: Never used  Substance and Sexual Activity   Alcohol use: Yes    Comment: 3 timesa a week (wine or beer)   Drug use: No   Sexual activity: Not on file  Other Topics Concern   Not on file  Social History Narrative   Two level home alone   Right handed   Caffeine - one cup coffee am    Exercise - yes walk dogs   Education - 1 year of college    Social Determinants of Health   Financial Resource Strain: Not on file  Food Insecurity: Not on file  Transportation Needs: Not on file  Physical Activity: Not on file  Stress: Not on file  Social Connections: Not on file  Intimate Partner Violence: Not on file    Past Surgical History:  Procedure Laterality Date   CARDIAC CATHETERIZATION     COLONOSCOPY     IR THORACENTESIS ASP PLEURAL SPACE W/IMG GUIDE  03/13/2021   ORIF CLAVICULAR FRACTURE Left 04/18/2016   Procedure: OPEN REDUCTION INTERNAL FIXATION (ORIF) LEFT CLAVICLE FRACTURE;  Surgeon: Francena Hanly, MD;  Location: [facility] OR;  Service: Orthopedics;  Laterality: Left;   RIGHT/LEFT HEART CATH AND CORONARY ANGIOGRAPHY N/A 03/16/2021   Procedure: RIGHT/LEFT HEART CATH AND CORONARY ANGIOGRAPHY;  Surgeon: Dolores Patty, MD;  Location: [facility] INVASIVE CV LAB;  Service: Cardiovascular;  Laterality: N/A;   TEE WITHOUT CARDIOVERSION N/A 03/14/2021   Procedure: TRANSESOPHAGEAL ECHOCARDIOGRAM (TEE);  Surgeon: Wendall Stade, MD;  Location: [facility] ENDOSCOPY;  Service: Cardiovascular;  Laterality:  N/A;   THYROIDECTOMY     TONSILLECTOMY       No current facility-administered medications for this encounter.    Physical Exam:   There were no vitals taken for this visit. Affect appropriate Healthy:  appears stated age HEENT: normal Neck supple with no adenopathy JVP normal no bruits no thyromegaly Lungs clear with no wheezing and good diaphragmatic motion Heart:  S1/S2 no murmur, no rub, gallop or click PMI normal post sternotomy Abdomen: benighn, BS positve, no tenderness, no AAA no bruit.  No HSM or HJR Distal pulses intact with no bruits No edema Neuro non-focal Skin warm and dry No muscular weakness   Labs:   Lab Results  Component Value Date   WBC 7.2 08/06/2021   HGB 14.7 08/06/2021   HCT 45.3 08/06/2021   MCV 90 08/06/2021   PLT 283 08/06/2021   No results for input(s): NA, K, CL, CO2, BUN, CREATININE, CALCIUM, PROT, BILITOT, ALKPHOS, ALT, AST, GLUCOSE in the last 168 hours.  Invalid input(s): LABALBU Lab Results  Component Value Date   CKTOTAL 14 (L) 03/02/2021   No results found for: CHOL No results found for: HDL No results found for: LDLCALC No results found for: TRIG No results found for: CHOLHDL No results found for: LDLDIRECT    Radiology: No results found.  EKG: Atrial flutter    ASSESSMENT AND PLAN:   Atrial Flutter:  on Rx eliquis No longer on amiodarone Post bioprosthetic MVR for SBE Risks including intubation, PPM, aspiration and stroke discussed willing to proceed   Signed: Charlton Haws 09/03/2021, 8:32 AM

## 2021-09-03 NOTE — Transfer of Care (Signed)
Immediate Anesthesia Transfer of Care Note  Patient: Brandon [name]  Procedure(s) Performed: CARDIOVERSION  Patient Location: Endoscopy Unit  Anesthesia Type:General  Level of Consciousness: awake, alert  and oriented  Airway & Oxygen Therapy: Patient Spontanous Breathing  Post-op Assessment: Report given to RN, Post -op Vital signs reviewed and stable and Patient moving all extremities X 4  Post vital signs: Reviewed and stable  Last Vitals:  Vitals Value Taken Time  BP    Temp    Pulse    Resp    SpO2      Last Pain:  Vitals:   09/03/21 0850  TempSrc: Temporal  PainSc: 0-No pain         Complications: No notable events documented.

## 2021-09-03 NOTE — Discharge Instructions (Signed)

## 2021-09-04 ENCOUNTER — Encounter (HOSPITAL_COMMUNITY): Payer: Self-pay | Admitting: Cardiovascular Disease

## 2021-09-04 NOTE — Anesthesia Postprocedure Evaluation (Signed)
Anesthesia Post Note  Patient: Brandon [name]  Procedure(s) Performed: CARDIOVERSION     Patient location during evaluation: Endoscopy Anesthesia Type: General Level of consciousness: awake and alert Pain management: pain level controlled Vital Signs Assessment: post-procedure vital signs reviewed and stable Respiratory status: spontaneous breathing, nonlabored ventilation, respiratory function stable and patient connected to nasal cannula oxygen Cardiovascular status: blood pressure returned to baseline and stable Postop Assessment: no apparent nausea or vomiting Anesthetic complications: no   No notable events documented.  Last Vitals:  Vitals:   09/03/21 0940 09/03/21 0950  BP: 123/62 139/68  Pulse: 73 72  Resp: 12 15  Temp:    SpO2: 96% 97%    Last Pain:  Vitals:   09/03/21 0950  TempSrc:   PainSc: 0-No pain                 Ilissa Rosner S

## 2021-09-05 ENCOUNTER — Ambulatory Visit (HOSPITAL_COMMUNITY)
Admission: RE | Admit: 2021-09-05 | Discharge: 2021-09-05 | Disposition: A | Discharge status: Home / Self Care | Payer: Medicare Other | Source: Ambulatory Visit | Attending: Physician Assistant | Admitting: Physician Assistant

## 2021-09-05 ENCOUNTER — Other Ambulatory Visit: Payer: Self-pay

## 2021-09-05 ENCOUNTER — Encounter (HOSPITAL_COMMUNITY): Payer: Self-pay | Admitting: Physician Assistant

## 2021-09-05 VITALS — BP 144/80 | HR 71 | Ht 73.0 in | Wt 204.0 lb

## 2021-09-05 DIAGNOSIS — I4892 Unspecified atrial flutter: Secondary | ICD-10-CM | POA: Insufficient documentation

## 2021-09-05 DIAGNOSIS — Z7982 Long term (current) use of aspirin: Secondary | ICD-10-CM | POA: Insufficient documentation

## 2021-09-05 DIAGNOSIS — Z7901 Long term (current) use of anticoagulants: Secondary | ICD-10-CM | POA: Diagnosis not present

## 2021-09-05 DIAGNOSIS — I480 Paroxysmal atrial fibrillation: Secondary | ICD-10-CM | POA: Insufficient documentation

## 2021-09-05 DIAGNOSIS — Z8249 Family history of ischemic heart disease and other diseases of the circulatory system: Secondary | ICD-10-CM | POA: Diagnosis not present

## 2021-09-05 DIAGNOSIS — Z8616 Personal history of COVID-19: Secondary | ICD-10-CM | POA: Insufficient documentation

## 2021-09-05 DIAGNOSIS — E785 Hyperlipidemia, unspecified: Secondary | ICD-10-CM | POA: Diagnosis not present

## 2021-09-05 DIAGNOSIS — E039 Hypothyroidism, unspecified: Secondary | ICD-10-CM | POA: Diagnosis not present

## 2021-09-05 DIAGNOSIS — I483 Typical atrial flutter: Secondary | ICD-10-CM

## 2021-09-05 DIAGNOSIS — Z79899 Other long term (current) drug therapy: Secondary | ICD-10-CM | POA: Insufficient documentation

## 2021-09-05 DIAGNOSIS — Z87891 Personal history of nicotine dependence: Secondary | ICD-10-CM | POA: Insufficient documentation

## 2021-09-05 DIAGNOSIS — G20 Parkinson's disease: Secondary | ICD-10-CM | POA: Diagnosis not present

## 2021-09-05 DIAGNOSIS — I059 Rheumatic mitral valve disease, unspecified: Secondary | ICD-10-CM | POA: Diagnosis not present

## 2021-09-05 MED ORDER — METOPROLOL TARTRATE 25 MG PO TABS: ORAL_TABLET | ORAL | 3 refills | Status: DC

## 2021-09-05 MED ORDER — APIXABAN 5 MG PO TABS: 5.0000 mg | ORAL_TABLET | Freq: Two times a day (BID) | ORAL | 6 refills | Status: DC

## 2021-09-05 NOTE — Patient Instructions (Addendum)
Stop Eliquis on November 7th  Change metoprolol to use only as needed for breakthrough afib. Can use every 6-8 hours for elevated heart rates  Follow up with Dr. Eden Emms in 3 months

## 2021-09-05 NOTE — Progress Notes (Signed)
Primary Care Physician: Soundra Pilon, FNP Primary Cardiologist: Dr Eden Emms Primary Electrophysiologist: none Referring Physician: Dr Darlin Drop is a 70 y.o. male with a history of anemia, Parkinson's disease, endocarditis of mitral valve s/p MVR, HLD, hypothyroidism, atrial fibrillation, atrial flutter who presents for consultation in the [facility]. 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 The patient was diagnosed with atrial fibrillation postoperatively following MVR. He was on a short course of amiodarone but this was discontinued. Patient seen by Dr Anne Fu on 08/06/21 for an irregular rhythm on his Kardia mobile device. ECG showed rate controlled typical atrial flutter. Patient was restarted on Eliquis for a CHADS2VASC score of 1 and underwent DCCV on 09/03/21. Patient reports that he has felt well since the procedure with no afib seen on his Lourena Simmonds.   Today, he denies symptoms of palpitations, chest pain, shortness of breath, orthopnea, PND, lower extremity edema, dizziness, presyncope, syncope, snoring, daytime somnolence, bleeding, or neurologic sequela. The patient is tolerating medications without difficulties and is otherwise without complaint today.    Atrial Fibrillation Risk Factors:  he does not have symptoms or diagnosis of sleep apnea. he does not have a history of rheumatic fever. he does have a history of alcohol use. The patient does not have a history of early familial atrial fibrillation or other arrhythmias.  he has a BMI of Body mass index is 26.91 kg/m.Marland Kitchen Filed Weights   09/05/21 1005  Weight: 92.5 kg    Family History  Problem Relation Age of Onset   Hypertension Mother    Breast cancer Mother    Heart disease Father    Emphysema Father    Heart attack Sister    Heart attack Brother      Atrial Fibrillation Management history:  Previous antiarrhythmic drugs: amiodarone  Previous cardioversions:  09/03/21 Previous ablations: none CHADS2VASC score: 1 Anticoagulation history: Eliquis   Past Medical History:  Diagnosis Date   Anemia    Atrial fibrillation (HCC)    Bacteremia due to Enterococcus    BPH (benign prostatic hyperplasia)    Cervical spine disease    Chest pain 2007   none since   COVID    Endocarditis of mitral valve    Gallbladder disorder    Hemopneumothorax on left    Hiatal hernia    Hypercholesteremia    Hyperlipidemia    Hyperthyroidism    Hypoxia    Meniere's disease    Parkinson disease (HCC)    Pneumothorax on right    PONV (postoperative nausea and vomiting)    also has trouble waking up   S/P MVR (mitral valve replacement)    SOB (shortness of breath)    Past Surgical History:  Procedure Laterality Date   CARDIAC CATHETERIZATION     CARDIOVERSION N/A 09/03/2021   Procedure: CARDIOVERSION;  Surgeon: Wendall Stade, MD;  Location: [facility] ENDOSCOPY;  Service: Cardiovascular;  Laterality: N/A;   COLONOSCOPY     IR THORACENTESIS ASP PLEURAL SPACE W/IMG GUIDE  03/13/2021   ORIF CLAVICULAR FRACTURE Left 04/18/2016   Procedure: OPEN REDUCTION INTERNAL FIXATION (ORIF) LEFT CLAVICLE FRACTURE;  Surgeon: Francena Hanly, MD;  Location: [facility] OR;  Service: Orthopedics;  Laterality: Left;   RIGHT/LEFT HEART CATH AND CORONARY ANGIOGRAPHY N/A 03/16/2021   Procedure: RIGHT/LEFT HEART CATH AND CORONARY ANGIOGRAPHY;  Surgeon: Dolores Patty, MD;  Location: [facility] INVASIVE CV LAB;  Service: Cardiovascular;  Laterality: N/A;   TEE WITHOUT CARDIOVERSION N/A 03/14/2021  Procedure: TRANSESOPHAGEAL ECHOCARDIOGRAM (TEE);  Surgeon: Wendall Stade, MD;  Location: [facility] ENDOSCOPY;  Service: Cardiovascular;  Laterality: N/A;   THYROIDECTOMY     TONSILLECTOMY      Current Outpatient Medications  Medication Sig Dispense Refill   apixaban (ELIQUIS) 5 MG TABS tablet Take 1 tablet (5 mg total) by mouth 2 (two) times daily. 60 tablet 6   aspirin EC 81 MG tablet Take 1 tablet (81 mg  total) by mouth daily. Swallow whole. 90 tablet 3   cholecalciferol (VITAMIN D) 1000 units tablet Take 1,000 Units by mouth daily.     fluticasone (FLONASE) 50 MCG/ACT nasal spray Place 1 spray into both nostrils daily as needed for allergies.     levothyroxine (SYNTHROID) 175 MCG tablet Take 175 mcg by mouth daily before breakfast.     Misc Natural Products (JOINT HEALTH PO) Take 1 tablet by mouth daily.     PROBIOTIC, LACTOBACILLUS, PO Take 1 capsule by mouth daily.     testosterone cypionate (DEPOTESTOSTERONE CYPIONATE) 200 MG/ML injection Inject 120 mg into the muscle every Saturday. 0.6 mL     traZODone (DESYREL) 50 MG tablet Take 25 mg by mouth at bedtime.     triamterene-hydrochlorothiazide (DYAZIDE) 37.5-25 MG capsule Take 1 capsule by mouth daily.     vitamin B-12 (CYANOCOBALAMIN) 1000 MCG tablet Take 1,000 mcg by mouth daily.     metoprolol tartrate (LOPRESSOR) 25 MG tablet Take 0.5 tablets (12.5 mg total) by mouth 2 (two) times daily. (Patient not taking: Reported on 09/05/2021) 90 tablet 3   No current facility-administered medications for this encounter.    Allergies  Allergen Reactions   Ceftin [Cefuroxime Axetil] Diarrhea   Oxycodone Nausea And Vomiting   Iohexol     Causes pt to sneeze 03/02/21 pt received prednisone and benadryl 1hr before CT Abd with contrast, tolerated w/o issue 03/10/17 Patient started sneezing during the injection and continued sneezing after exam. ER doc notified and patient was given benadryl and cortisone    Social History   Socioeconomic History   Marital status: Widowed    Spouse name: Not on file   Number of children: Not on file   Years of education: Not on file   Highest education level: Not on file  Occupational History    Comment: factory work  Tobacco Use   Smoking status: Former    Types: Cigarettes    Quit date: 04/18/1979    Years since quitting: 42.4   Smokeless tobacco: Never   Tobacco comments:    Former smoker 09/05/2021   Vaping Use   Vaping Use: Never used  Substance and Sexual Activity   Alcohol use: Yes    Alcohol/week: 2.0 standard drinks    Types: 1 Glasses of wine, 1 Cans of beer per week    Comment: 3 times a a week (wine or beer)   Drug use: No   Sexual activity: Not on file  Other Topics Concern   Not on file  Social History Narrative   Two level home alone   Right handed   Caffeine - one cup coffee am    Exercise - yes walk dogs   Education - 1 year of college    Social Determinants of Health   Financial Resource Strain: Not on file  Food Insecurity: Not on file  Transportation Needs: Not on file  Physical Activity: Not on file  Stress: Not on file  Social Connections: Not on file  Intimate Partner Violence: Not on  file     ROS- All systems are reviewed and negative except as per the HPI above.  Physical Exam: Vitals:   09/05/21 1005  BP: (!) 144/80  Pulse: 71  Weight: 92.5 kg  Height: 6\' 1"  (1.854 m)    GEN- The patient is a well appearing male, alert and oriented x 3 today.   Head- normocephalic, atraumatic Eyes-  Sclera clear, conjunctiva pink Ears- hearing intact Oropharynx- clear Neck- supple  Lungs- Clear to ausculation bilaterally, normal work of breathing Heart- Regular rate and rhythm, no murmurs, rubs or gallops  GI- soft, NT, ND, + BS Extremities- no clubbing, cyanosis, or edema MS- no significant deformity or atrophy Skin- no rash or lesion Psych- euthymic mood, full affect Neuro- strength and sensation are intact  Wt Readings from Last 3 Encounters:  09/05/21 92.5 kg  09/03/21 90.7 kg  08/06/21 92.1 kg    EKG today demonstrates  SR Vent. rate 71 BPM PR interval 178 ms QRS duration 96 ms QT/QTcB 392/425 ms  Echo 03/04/21 demonstrated   1. There is a mass present on the anterior mitral valve leaflet  concerning for a vegetation that measures 1.4x1.0cm. Suspect moderate mitral regurgitation with two separate jets that are posteriorly and  anteriorly directed raising concern for possible  leaflet perforation.The mean gradient across the MV is 05/04/21 (HR 100) with MVA by continuity of 1.35cm2 suggesting at least mild mitral stenosis in the setting of the mitral valve vegetation as detailed above. Recommend TEE for further evaluation.   2. Left ventricular ejection fraction, by estimation, is 60 to 65%. The  left ventricle has normal function. The left ventricle has no regional  wall motion abnormalities. There is moderate asymmetric left ventricular hypertrophy of the basal-septal segment. There is mild LVH of the rest of the LV segments. Left ventricular diastolic parameters are consistent with Grade I diastolic dysfunction (impaired relaxation).   3. Right ventricular systolic function is normal. The right ventricular  size is normal. There is severely elevated pulmonary artery systolic  pressure. The estimated right ventricular systolic pressure is 71.0 mmHg.   4. The aortic valve is tricuspid. There is mild calcification of the  aortic valve. There is moderate thickening of the aortic valve. Aortic  valve regurgitation is mild. Mild to moderate aortic valve  sclerosis/calcification is present, without any evidence of aortic stenosis. No distinct vegetations visualized, but the valve is thickened.   5. The inferior vena cava is normal in size with greater than 50%  respiratory variability, suggesting right atrial pressure of 3 mmHg.   Comparison(s): No prior Echocardiogram.   Conclusion(s)/Recommendation(s): Findings concerning for mitral valve vegetation, would recommend a Transesophageal echocardiogram for clarification.   Epic records are reviewed at length today  CHA2DS2-VASc Score = 1  The patient's score is based upon: CHF History: 0 HTN History: 0 Diabetes History: 0 Stroke History: 0 Vascular Disease History: 0 Age Score: 1 Gender Score: 0      ASSESSMENT AND PLAN: 1. Paroxysmal Atrial Fibrillation/atrial  flutter The patient's CHA2DS2-VASc score is 1, indicating a 0.6% annual risk of stroke.   S/p DCCV on 09/03/21 Patient in SR today.  Continue Eliquis 5 mg BID for 4 weeks post DCCV, then may discontinue with low CV score.  Will change metoprolol to 12.5 mg PRN q 6 hours for heart racing. If he has recurrence of afib or flutter, he could be considered for ablation.  Kardia mobile for home monitoring.    2. Endocarditis  S/p  MVR 02/2021 at [facility]    Follow up with Dr Eden Emms in 3 months. AF clinic in 6 months.    Jorja Loa PA-C Afib Clinic [facility] [address] [place], [place] [address] [phone] 09/05/2021 10:19 AM

## 2021-10-02 ENCOUNTER — Telehealth (HOSPITAL_COMMUNITY): Payer: Self-pay

## 2021-10-02 NOTE — Telephone Encounter (Signed)
Received a cardiac rehab referral for pt from [facility]. Unable to reach pt left a message for pt to call back to see if pt is interested in the cardiac rehab. Passed pt ppw to nurse navigator for review.

## 2021-11-02 ENCOUNTER — Telehealth (HOSPITAL_COMMUNITY): Payer: Self-pay

## 2021-11-02 NOTE — Telephone Encounter (Signed)
No repsonse from pt.  Closed referral  

## 2021-12-06 NOTE — Progress Notes (Signed)
Cardiology Office Note    Date:  12/12/2021   ID:  AUL DONABEDIAN, DOB [birth year], MRN [id number]   PCP:  Kristen Loader, [place]  Cardiologist:  Jenkins Rouge, MD   Advanced Practice Provider:  No care team member to display Electrophysiologist:  None   [phone]   Chief Complaint  Patient presents with   Follow-up    History of Present Illness:  Brandon [name] is a 71 y.o. male  with a history of anemia, Parkinson's disease, endocarditis of mitral valve s/p MVR, HLD, hypothyroidism, atrial fibrillation, atrial flutter. Patient had bioprosthetic MVR 03/23/21 at [facility]. The patient was diagnosed with atrial fibrillation postoperatively following MVR. He was on a short course of amiodarone but this was discontinued. Patient seen by Dr Marlou Porch on 08/06/21 for an irregular rhythm on his Kardia mobile device. ECG showed rate controlled typical atrial flutter. Patient was restarted on Eliquis for a CHADS2VASC score of 1 and underwent DCCV on 09/03/21.   Patient seen in the Afib clinic 09/05/21 and in NSR. Eliquis could be stopped after 4 weeks with CHADSVASC 1.  Patient comes in for f/u. Checks Brandon [name] a couple times weekly and has been in NSR. Resting HR about 82/m. Dneies chest pain, dyspnea, palpitations, dizziness or presyncope. Rides elliptical 30 min 3 days/week. Trying to get cholesterol down with diet and exercise. LDL 160 09/2021.  Past Medical History:  Diagnosis Date   Anemia    Atrial fibrillation ([place])    Bacteremia due to Enterococcus    BPH (benign prostatic hyperplasia)    Cervical spine disease    Chest pain 2007   none since   COVID    Endocarditis of mitral valve    Gallbladder disorder    Hemopneumothorax on left    Hiatal hernia    Hypercholesteremia    Hyperlipidemia    Hyperthyroidism    Hypoxia    Meniere's disease    Parkinson disease (HCC)    Pneumothorax on right    PONV (postoperative nausea and vomiting)    also  has trouble waking up   S/P MVR (mitral valve replacement)    SOB (shortness of breath)     Past Surgical History:  Procedure Laterality Date   CARDIAC CATHETERIZATION     CARDIOVERSION N/A 09/03/2021   Procedure: CARDIOVERSION;  Surgeon: Josue Hector, MD;  Location: [place];  Service: Cardiovascular;  Laterality: N/A;   COLONOSCOPY     IR THORACENTESIS ASP PLEURAL SPACE W/IMG GUIDE  03/13/2021   ORIF CLAVICULAR FRACTURE Left 04/18/2016   Procedure: OPEN REDUCTION INTERNAL FIXATION (ORIF) LEFT CLAVICLE FRACTURE;  Surgeon: Justice Britain, MD;  Location: [facility];  Service: Orthopedics;  Laterality: Left;   RIGHT/LEFT HEART CATH AND CORONARY ANGIOGRAPHY N/A 03/16/2021   Procedure: RIGHT/LEFT HEART CATH AND CORONARY ANGIOGRAPHY;  Surgeon: Jolaine Artist, MD;  Location: [facility];  Service: Cardiovascular;  Laterality: N/A;   TEE WITHOUT CARDIOVERSION N/A 03/14/2021   Procedure: TRANSESOPHAGEAL ECHOCARDIOGRAM (TEE);  Surgeon: Josue Hector, MD;  Location: [facility] ENDOSCOPY;  Service: Cardiovascular;  Laterality: N/A;   THYROIDECTOMY     TONSILLECTOMY      Current Medications: Current Meds  Medication Sig   aspirin EC 81 MG tablet Take 1 tablet (81 mg total) by mouth daily. Swallow whole.   cholecalciferol (VITAMIN D) 1000 units tablet Take 1,000 Units by mouth daily.   fluticasone (FLONASE) 50 MCG/ACT nasal spray Place 1 spray  into both nostrils daily as needed for allergies.   levothyroxine (SYNTHROID) 175 MCG tablet Take 175 mcg by mouth daily before breakfast.   Misc Natural Products (JOINT HEALTH PO) Take 1 tablet by mouth daily.   PROBIOTIC, LACTOBACILLUS, PO Take 1 capsule by mouth daily.   testosterone cypionate (DEPOTESTOSTERONE CYPIONATE) 200 MG/ML injection Inject 120 mg into the muscle every Saturday. 0.6 mL   traZODone (DESYREL) 100 MG tablet Take 50-100 mg by mouth at bedtime.   triamterene-hydrochlorothiazide (DYAZIDE) 37.5-25 MG capsule Take 1 capsule by mouth  daily.   vitamin B-12 (CYANOCOBALAMIN) 1000 MCG tablet Take 1,000 mcg by mouth daily.     Allergies:   Ceftin [cefuroxime axetil], Oxycodone, Prednisone & diphenhydramine, Statins, and Iohexol   Social History   Socioeconomic History   Marital status: Widowed    Spouse name: Not on file   Number of children: Not on file   Years of education: Not on file   Highest education level: Not on file  Occupational History    Comment: factory work  Tobacco Use   Smoking status: Former    Types: Cigarettes    Quit date: 04/18/1979    Years since quitting: 42.6   Smokeless tobacco: Never   Tobacco comments:    Former smoker 09/05/2021  Vaping Use   Vaping Use: Never used  Substance and Sexual Activity   Alcohol use: Yes    Alcohol/week: 2.0 standard drinks    Types: 1 Glasses of wine, 1 Cans of beer per week    Comment: 3 times a a week (wine or beer)   Drug use: No   Sexual activity: Not on file  Other Topics Concern   Not on file  Social History Narrative   Two level home alone   Right handed   Caffeine - one cup coffee am    Exercise - yes walk dogs   Education - 1 year of college    Social Determinants of Health   Financial Resource Strain: Not on file  Food Insecurity: Not on file  Transportation Needs: Not on file  Physical Activity: Not on file  Stress: Not on file  Social Connections: Not on file     Family History:  The patient's  family history includes Breast cancer in his mother; Emphysema in his father; Heart attack in his brother and sister; Heart disease in his father; Hypertension in his mother.   ROS:   Please see the history of present illness.    ROS All other systems reviewed and are negative.   PHYSICAL EXAM:   VS:  BP 110/74    Pulse 82    Ht 6\' 1"  (1.854 m)    Wt 207 lb (93.9 kg)    SpO2 97%    BMI 27.31 kg/m   Physical Exam  GEN: Well nourished, well developed, in no acute distress  Neck: no JVD, carotid bruits, or masses Cardiac:RRR; no  murmurs, rubs, or gallops  Respiratory:  clear to auscultation bilaterally, normal work of breathing GI: soft, nontender, nondistended, + BS Ext: without cyanosis, clubbing, or edema, Good distal pulses bilaterally Neuro:  Alert and Oriented x 3 Psych: euthymic mood, full affect  Wt Readings from Last 3 Encounters:  12/12/21 207 lb (93.9 kg)  09/05/21 204 lb (92.5 kg)  09/03/21 200 lb (90.7 kg)      Studies/Labs Reviewed:   EKG:  EKG is not ordered today.     Recent Labs: 03/04/2021: TSH 2.550 03/12/2021: ALT 38  03/13/2021: B Natriuretic Peptide 331.0 03/16/2021: Magnesium 2.0 08/06/2021: BUN 19; Creatinine, Ser 1.20; Hemoglobin 14.7; Platelets 283; Potassium 4.9; Sodium 138   Lipid Panel No results found for: CHOL, TRIG, HDL, CHOLHDL, VLDL, LDLCALC, LDLDIRECT  Additional studies/ records that were reviewed today include:    Echo 03/04/2021:    1. There is a mass present on the anterior mitral valve leaflet  concerning for a vegetation that measures 1.4x1.0cm. Suspect moderate mitral regurgitation with two separate jets that are posteriorly and anteriorly directed raising concern for possible  leaflet perforation.The mean gradient across the MV is 59mmHg (HR 100) with MVA by continuity of 1.35cm2 suggesting at least mild mitral stenosis in the setting of the mitral valve vegetation as detailed above. Recommend TEE for further evaluation.   2. Left ventricular ejection fraction, by estimation, is 60 to 65%. The left ventricle has normal function. The left ventricle has no regional wall motion abnormalities. There is moderate asymmetric left ventricular hypertrophy of the basal-septal  segment. There is mild LVH of the rest of the LV segments. Left  ventricular diastolic parameters are consistent with Grade I diastolic dysfunction (impaired relaxation).   3. Right ventricular systolic function is normal. The right ventricular size is normal. There is severely elevated pulmonary artery  systolic pressure. The estimated right ventricular systolic pressure is [id number] mmHg.   4. The aortic valve is tricuspid. There is mild calcification of the  aortic valve. There is moderate thickening of the aortic valve. Aortic valve regurgitation is mild. Mild to moderate aortic valve  sclerosis/calcification is present, without any  evidence of aortic stenosis. No distinct vegetations visualized, but the valve is thickened.   5. The inferior vena cava is normal in size with greater than 50% respiratory variability, suggesting right atrial pressure of 3 mmHg.   Coronary Angio 03/16/2021:   1. Mild non-obstructive CAD 2. Normal EF 60% 3. Severe v-waves in PCW tracing in setting of known severe MR 4. COVID PNA   TEE 03/14/2021:   1. Left ventricular ejection fraction, by estimation, is 55 to 60%. The left ventricle has normal function.   2. Right ventricular systolic function is normal. The right ventricular size is normal.   3. Left atrial size was mildly dilated. No left atrial/left atrial  appendage thrombus was detected.   4. Flail A2 segment with perforation at the base of A2 and large  vegetation 2.2 cm on ventricular side extending into papillary muscle. Intervalvular fibrosa intact Also appears to be small vegetation on P2. Findings discussed with CVTS Dr Roxan Hockey. Extensive 3D imaging of MV was performed . The mitral valve is abnormal. 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   5. The aortic valve is tricuspid. There is mild calcification of the  aortic valve. Aortic valve regurgitation is trivial. Mild aortic valve  sclerosis is present, with no evidence of aortic valve stenosis.   Recent Labs  Risk Assessment/Calculations:    CHA2DS2-VASc Score = 1   This indicates a 0.6% annual risk of stroke. The patient's score is based upon: CHF History: 0 HTN History: 0 Diabetes History: 0 Stroke History: 0 Vascular Disease History: 0 Age Score: 1 Gender Score: 0          ASSESSMENT:    1. Paroxysmal atrial fibrillation (HCC)   2. Typical atrial flutter (HCC)   3. Endocarditis of mitral valve   4. Essential hypertension   5. Hypercholesteremia   6. Parkinson's disease ([place])   7. Family history of early CAD  PLAN:  In order of problems listed above:  Atrial flutter/fib S/P DCCV 09/03/21 on metoprolol prn, no anticoag because of CHADSVASC=1-Kardia mobile home monitoring.Maintaining NSR without recurrence  Bioprosthetic MVR for endocarditis 03/23/21 [facility]-doing well, no murmur  HTN on dyazide  HLD-LDL 160 09/2021 trying diet control. He has had myalgias on statin.  Parkinson's  Family history of early CAD   Shared Decision Making/Informed Consent        Medication Adjustments/Labs and Tests Ordered: Current medicines are reviewed at length with the patient today.  Concerns regarding medicines are outlined above.  Medication changes, Labs and Tests ordered today are listed in the Patient Instructions below. Patient Instructions  Medication Instructions:  Your physician recommends that you continue on your current medications as directed. Please refer to the Current Medication list given to you today.  *If you need a refill on your cardiac medications before your next appointment, please call your pharmacy*   Lab Work: None If you have labs (blood work) drawn today and your tests are completely normal, you will receive your results only by: [place] (if you have MyChart) OR A paper copy in the mail If you have any lab test that is abnormal or we need to change your treatment, we will call you to review the results.    Follow-Up: At [facility], you and your health needs are our priority.  As part of our continuing mission to provide you with exceptional heart care, we have created designated Provider Care Teams.  These Care Teams include your primary Cardiologist (physician) and Advanced Practice Providers (APPs -   Physician Assistants and Nurse Practitioners) who all work together to provide you with the care you need, when you need it.   Your next appointment:   1 year(s)  The format for your next appointment:   In Person  Provider:   Jenkins Rouge, MD {   Signed, Ermalinda Barrios, PA-C  12/12/2021 2:31 PM    [address], [place], [place]  [address] Phone: [phone]; Fax: [phone]

## 2021-12-12 ENCOUNTER — Ambulatory Visit: Payer: Medicare Other | Admitting: Physician Assistant

## 2021-12-12 ENCOUNTER — Encounter: Payer: Self-pay | Admitting: Physician Assistant

## 2021-12-12 ENCOUNTER — Other Ambulatory Visit: Payer: Self-pay

## 2021-12-12 VITALS — BP 110/74 | HR 82 | Ht 73.0 in | Wt 207.0 lb

## 2021-12-12 DIAGNOSIS — I480 Paroxysmal atrial fibrillation: Secondary | ICD-10-CM | POA: Diagnosis not present

## 2021-12-12 DIAGNOSIS — I483 Typical atrial flutter: Secondary | ICD-10-CM | POA: Diagnosis not present

## 2021-12-12 DIAGNOSIS — Z8249 Family history of ischemic heart disease and other diseases of the circulatory system: Secondary | ICD-10-CM

## 2021-12-12 DIAGNOSIS — I10 Essential (primary) hypertension: Secondary | ICD-10-CM | POA: Diagnosis not present

## 2021-12-12 DIAGNOSIS — G20 Parkinson's disease: Secondary | ICD-10-CM

## 2021-12-12 DIAGNOSIS — I059 Rheumatic mitral valve disease, unspecified: Secondary | ICD-10-CM | POA: Diagnosis not present

## 2021-12-12 DIAGNOSIS — E7800 Pure hypercholesterolemia, unspecified: Secondary | ICD-10-CM

## 2021-12-12 NOTE — Patient Instructions (Signed)
Medication Instructions:  Your physician recommends that you continue on your current medications as directed. Please refer to the Current Medication list given to you today.  *If you need a refill on your cardiac medications before your next appointment, please call your pharmacy*   Lab Work: None If you have labs (blood work) drawn today and your tests are completely normal, you will receive your results only by: MyChart Message (if you have MyChart) OR A paper copy in the mail If you have any lab test that is abnormal or we need to change your treatment, we will call you to review the results.    Follow-Up: At [facility], you and your health needs are our priority.  As part of our continuing mission to provide you with exceptional heart care, we have created designated Provider Care Teams.  These Care Teams include your primary Cardiologist (physician) and Advanced Practice Providers (APPs -  Physician Assistants and Nurse Practitioners) who all work together to provide you with the care you need, when you need it.   Your next appointment:   1 year(s)  The format for your next appointment:   In Person  Provider:   Charlton Haws, MD {

## 2022-01-28 NOTE — Progress Notes (Signed)
? ? ?Assessment/Plan:  ? ?1.  Parkinsons Disease ? -Patient wishes to hold off on medication for now.  We discussed AAN recommendations again today.  He has very mild disease (H &Y 1) ? -Patient is following with Dr. Yetta Barre for dermatology.  He just saw him about a month ago.   Understands that Parkinson's slightly increases risk for melanoma ? -Discussed exercise.  He has started back to that plan ? ?2.  Hx endocarditis ? -Status post mitral valve replacement in April, 2022. ? -Had postoperative atrial fibrillation, now resolved, status post cardioversion ? -Has been on metoprolol since the atrial fibrillation and we will need to watch that given tendency for Parkinson's to lower blood pressure. ? ?3.  History of hypertension ? -On Dyazide in addition to the metoprolol mentioned above.  As above, will need to watch given tendency in Parkinson's for orthostasis. ? ? ?Subjective:  ? ?Brandon [name] was seen today in follow up for Parkinsons disease.  My previous records were reviewed prior to todays visit as well as outside records available to me.  Patient is on no medication for Parkinson's disease, by his choice. Pt denies falls.  Pt with occasional lightheadedness.  No near syncope.  No hallucinations.  Mood has been good. Gym 3 days per week.   ? ?Current prescribed movement disorder medications: ?None ? ? ?PREVIOUS MEDICATIONS: none to date ? ?ALLERGIES:   ?Allergies  ?Allergen Reactions  ? Ceftin [Cefuroxime Axetil] Diarrhea  ? Oxycodone Nausea And Vomiting  ? Prednisone & Diphenhydramine   ?  Other reaction(s): Unknown ?Other reaction(s): Unknown  ? Statins   ?  Other reaction(s): achy  felt  generally  terrible  ? Iohexol   ?  Causes pt to sneeze ?03/02/21 pt received prednisone and benadryl 1hr before CT Abd with contrast, tolerated w/o issue ?03/10/17 Patient started sneezing during the injection and continued sneezing after exam. ER doc notified and patient was given benadryl and cortisone  ? ? ?CURRENT  MEDICATIONS:  ?Outpatient Encounter Medications as of 01/29/2022  ?Medication Sig  ? aspirin EC 81 MG tablet Take 1 tablet (81 mg total) by mouth daily. Swallow whole.  ? cholecalciferol (VITAMIN D) 1000 units tablet Take 1,000 Units by mouth daily.  ? fluticasone (FLONASE) 50 MCG/ACT nasal spray Place 1 spray into both nostrils daily as needed for allergies.  ? levothyroxine (SYNTHROID) 175 MCG tablet Take 175 mcg by mouth daily before breakfast.  ? Misc Natural Products (JOINT HEALTH PO) Take 1 tablet by mouth daily.  ? PROBIOTIC, LACTOBACILLUS, PO Take 1 capsule by mouth daily.  ? testosterone cypionate (DEPOTESTOSTERONE CYPIONATE) 200 MG/ML injection Inject 120 mg into the muscle every Saturday. 0.6 mL  ? traZODone (DESYREL) 100 MG tablet Take 50-100 mg by mouth at bedtime.  ? triamterene-hydrochlorothiazide (DYAZIDE) 37.5-25 MG capsule Take 1 capsule by mouth daily.  ? vitamin B-12 (CYANOCOBALAMIN) 1000 MCG tablet Take 1,000 mcg by mouth daily.  ? apixaban (ELIQUIS) 5 MG TABS tablet Take 1 tablet (5 mg total) by mouth 2 (two) times daily for 26 days.  ? metoprolol tartrate (LOPRESSOR) 25 MG tablet Take 1/2 tablet by mouth every 6-8 hours as needed for breakthrough afib  ? ?No facility-administered encounter medications on file as of 01/29/2022.  ? ? ?Objective:  ? ?PHYSICAL EXAMINATION:   ? ?VITALS:   ?Vitals:  ? 01/29/22 1420  ?BP: 121/73  ?Pulse: 77  ?SpO2: 94%  ?Weight: 211 lb 3.2 oz (95.8 kg)  ?Height: 6\' 1"  (  1.854 m)  ? ? ? ? ?GEN:  The patient appears stated age and is in NAD. ?HEENT:  Normocephalic, atraumatic.  The mucous membranes are moist. The superficial temporal arteries are without ropiness or tenderness. ? ?Neurological examination: ? ?Orientation: The patient is alert and oriented x3. ?Cranial nerves: There is good facial symmetry with min facial hypomimia. The speech is fluent and clear. Soft palate rises symmetrically and there is no tongue deviation. Hearing is intact to conversational  tone. ?Sensation: Sensation is intact to light touch throughout ?Motor: Strength is at least antigravity x4. ? ?Movement examination: ?Tone: There is mild increased tone on the right. ?Abnormal movements: no tremor today ?Coordination:  There is no decremation today ?Gait and Station: The patient has no difficulty arising out of a deep-seated chair without the use of the hands. The patient's stride length is good  ? ?I have reviewed and interpreted the following labs independently ? ?  Chemistry   ?   ?Component Value Date/Time  ? NA 138 08/06/2021 1536  ? K 4.9 08/06/2021 1536  ? CL 99 08/06/2021 1536  ? CO2 25 08/06/2021 1536  ? BUN 19 08/06/2021 1536  ? CREATININE 1.20 08/06/2021 1536  ?    ?Component Value Date/Time  ? CALCIUM 9.0 08/06/2021 1536  ? ALKPHOS 92 03/12/2021 0825  ? AST 19 03/12/2021 0825  ? ALT 38 03/12/2021 0825  ? BILITOT 1.0 03/12/2021 0825  ?  ? ? ? ?Lab Results  ?Component Value Date  ? WBC 7.2 08/06/2021  ? HGB 14.7 08/06/2021  ? HCT 45.3 08/06/2021  ? MCV 90 08/06/2021  ? PLT 283 08/06/2021  ? ? ?Lab Results  ?Component Value Date  ? TSH 2.550 03/04/2021  ? ? ? ? ?Cc:  Soundra Pilon, FNP ? ?

## 2022-01-29 ENCOUNTER — Other Ambulatory Visit: Payer: Self-pay

## 2022-01-29 ENCOUNTER — Ambulatory Visit: Payer: Medicare Other | Admitting: Neurology

## 2022-01-29 ENCOUNTER — Encounter: Payer: Self-pay | Admitting: Neurology

## 2022-01-29 VITALS — BP 121/73 | HR 77 | Ht 73.0 in | Wt 211.2 lb

## 2022-01-29 DIAGNOSIS — G20 Parkinson's disease: Secondary | ICD-10-CM

## 2022-01-29 NOTE — Patient Instructions (Signed)
American Academy of Neurology recommendations are to take Parkinsons Disease medications early in disease, but the medication doesn't slow progression.  Local and Online Resources for Power over Parkinson's Group March 2023  LOCAL [place] PARKINSON'S GROUPS  Power over Parkinson's Group :   Power Over Parkinson's Patient Education Group will be Wednesday, March 8th-*Hybrid meting*- in person at [place] location and via [facility] at 2:00 pm.   Upcoming Power over Parkinson's Meetings:  2nd Wednesdays of the month at 2 pm:  March 8th, April 12th Contact Amy Marriott at [email or web address] if interested in participating in this group Parkinson's Care Partners Group:    3rd Mondays, Contact Brandon Paladino Atypical Parkinsonian Patient Group:   4th Wednesdays, Contact Brandon Paladino If you are interested in participating in these groups with Brandon, please contact her directly for how to join those meetings.  Her contact information is [email or web address].    LOCAL EVENTS AND NEW OFFERINGS Parkinson's Wellness Event:  Pottery Night at [facility].  Friday, March 10th 5:30-7:30 pm.  Sponsored by [employer] Dooms.  FREE event for people with Parkinson's and care partners.  RSVP to [facility] [email or web address] Dine out at [facility].  Celebrate Parkinson's disease Awareness Month and Support the Parkinson's Movement Disorder Fund.   Wednesday, April 19th 4-6 pm at [place], [employer].  (Give receipt to cashier and 20% will be donated) Parkinson's T-shirts for sale!  Designed by a local group member, with funds going to [place].  $20.00  [place] to purchase (see email above) New PWR! Moves Class offering at [employer]!  Fridays 1-2 pm in March (likely to switch days and times after March).  Come try it out and see if PWR! Moves is a good fit for your exercise routine!  Contact Amy Marriott for details:   [email or web address] Hamil-[place]err Challenge Bike, Run, Walk for PD, PSP, MSA.    Saturday, April 8th at 8 am.  [place]  Proceeds go to offset costs of exercise programs locally.  To Register, visit website:  www.[email or web address].[email or web address]  [place]-Academy:  www.parkinson.org PD Health at Home continues:  Mindfulness Mondays, Expert Briefing Tuesdays, Wellness Wednesdays, Take Time Thursdays, Fitness Fridays  Upcoming Education:  Parkinson's and Medications:  [employer].  Wednesday, March 8th at 1:00 pm. Freezing and Fall Prevention in Parkinson's.  Wednesday, April 12th at 1:00 pm Register for expert briefings Cytogeneticist) at WatchCalls.si [place] Chapter Parkinson's Symposium: Cognition Changes- a free in-person ([place], [place]) and online [employer]) event for people with Parkinson's and their loved ones. During this event we will explore new treatments and practical strategies for addressing these changes.  Saturday, April 1st, 10 am-1 pm.  Register at [email or web address]/ or contact Dorothy Puffer Gruccio at [phone] or [email or web address].  Please check out their website to sign up for emails and see their full online offerings  [place]:  www.[email or web address]  Third Thursday Webinars:  On the third Thursday of every month at 12 p.m. ET, join our free live webinars to learn about various aspects of living with Parkinson's disease and our work to speed medical breakthroughs. Upcoming Webinar:  The Power of Women in Philanthropy.  Tues, March 28th at  12 pm. Check out additional information on their website to see their full online offerings  [employer]:  www.[email or web address] Upcoming Webinar:   Stay tuned Webinar Series:  Living with Parkinson's Meetup.   Third Thursdays of each month at 3 pm  Care Partner  Monthly Meetup.  With Robin Searing Phinney.  First Tuesday of each month, 2 pm Check out additional information to Live Well Today on their website  Parkinson and Movement Disorders (PMD) Alliance:  www.pmdalliance.org [employer] Online:  Online Education Events Sign up for emails, which are sent weekly to give you updates on programming and online offerings  Parkinson's Association of the [place]:  www.parkinsonassociation.org Information on online support groups, education events, and online exercises including Yoga, Parkinson's exercises and more-LOTS of information on links to PD resources and online events Virtual Support Group through Parkinson's Association of the [place]; next one is scheduled for Wednesday, *April 5th at 2 pm. *March meeting has been cancelled. (These are typically scheduled for the 1st Wednesday of the month at 2 pm).  Visit website for details. MOVEMENT AND EXERCISE OPPORTUNITIES Parkinson's DRUMMING Classes/Music Therapy with Doylene Canning:  This is a returning class and it's FREE!  2nd Mondays, continuing March 13th , 11:00 at the [place].  Contact *Brandon [name] at [email or web address] or Doylene Canning at [phone] or [email or web address]  PWR! Moves Classes at [place].  Wednesdays 10 and 11 am.   NEW PWR! Moves Class offering at [employer].  Fridays 1-2 pm.  Contact Amy Gerrit Friends, PT [email or web address] if interested. Here is a link to the PWR!Moves classes on Zoom from [place] - Daily Mon-Sat at 10:00. Via Zoom, FREE and open to all.  There is also a link below via Facebook if you use that  platform. [email or web address].si [email or web address]  Parkinson's Wellness Recovery (PWR! Moves)  www.pwr4life.org Info on the PWR! Virtual Experience:  You will have access to our expertise through self-assessment, guided plans that start with the PD-specific fundamentals, educational content, tips, Q&A with an expert, and a growing Art therapist of PD-specific pre-recorded and live exercise classes of varying types and intensity - both physical and cognitive! If that is not enough, we offer 1:1 wellness consultations (in-person or virtual) to personalize your PWR! Research scientist (medical).  [place] Fridays:  As part of the PD Health @ Home program, this free video series focuses each week on one aspect of fitness designed to support people living with Parkinson's.  These weekly videos highlight the [place] recent fitness guidelines for people with Parkinson's disease. ModemGamers.si Dance for PD website is offering free, live-stream classes throughout the week, as well as links to [employer] of classes:  https://danceforparkinsons.org/ Dance for Parkinson's in-person class.  February 1-April 26, Wednesdays 4-5 pm.  Free class for people with Parkinson's disease, at [address] [place], [place].  Contact [phone] or Info@danceproject [email or web address] to register Virtual dance and Pilates for Parkinson's classes: Click on the Community Tab> Parkinson's Movement Initiative Tab.  To register for classes and for more information, visit www.[email or web address].co.za and click the community tab.  [employer] Parkinson's Cycling Classes  [employer]:  Thursdays @ Noon-Live classes at [employer] ([employer] at [email or web address] or  [phone]) [employer]: Virtual Classes Mondays and Thursdays Jeanette Caprice classes Tuesday, Wednesday and Thursday (contact [place] at [email or web address]  or [phone]) [place] Varied levels of classes are offered Mondays, Tuesdays and Thursdays at [facility].  Stretching with Verdis Frederickson weekly class is also offered for people with Parkinson's To observe a class or for more information, call [phone] or email Hezzie Bump at [email or web address] ADDITIONAL SUPPORT AND RESOURCES Well-Spring Solutions:Online Caregiver Education Opportunities:  www.well-springsolutions.org/caregiver-education/caregiver-support-group.  You may also contact Vickki Muff at [email or web address]  or [phone].    Well-Spring Navigator:  [employer] program, a free service to help individuals and families through the journey of determining care for older adults.  The Navigator is a Education officer, museum, Arnell Asal, who will speak with a prospective client and/or loved ones to provide an assessment of the situation and a set of recommendations for a personalized care plan -- all free of charge, and whether Well-Spring Solutions offers the needed service or not. If the need is not a service we provide, we are well-connected with reputable programs in town that we can refer you to.  www.well-springsolutions.org or to speak with the Navigator, call [phone]

## 2022-03-06 ENCOUNTER — Encounter (HOSPITAL_COMMUNITY): Payer: Self-pay | Admitting: Physician Assistant

## 2022-03-06 ENCOUNTER — Ambulatory Visit (HOSPITAL_COMMUNITY)
Admission: RE | Admit: 2022-03-06 | Discharge: 2022-03-06 | Disposition: A | Discharge status: Home / Self Care | Payer: Medicare Other | Source: Ambulatory Visit | Attending: Physician Assistant | Admitting: Physician Assistant

## 2022-03-06 VITALS — BP 144/90 | HR 82 | Ht 73.0 in | Wt 204.2 lb

## 2022-03-06 DIAGNOSIS — I480 Paroxysmal atrial fibrillation: Secondary | ICD-10-CM

## 2022-03-06 DIAGNOSIS — I4892 Unspecified atrial flutter: Secondary | ICD-10-CM | POA: Insufficient documentation

## 2022-03-06 DIAGNOSIS — I38 Endocarditis, valve unspecified: Secondary | ICD-10-CM | POA: Diagnosis not present

## 2022-03-06 DIAGNOSIS — G20 Parkinson's disease: Secondary | ICD-10-CM | POA: Diagnosis not present

## 2022-03-06 DIAGNOSIS — E785 Hyperlipidemia, unspecified: Secondary | ICD-10-CM | POA: Diagnosis not present

## 2022-03-06 DIAGNOSIS — E039 Hypothyroidism, unspecified: Secondary | ICD-10-CM | POA: Insufficient documentation

## 2022-03-06 DIAGNOSIS — Z79899 Other long term (current) drug therapy: Secondary | ICD-10-CM | POA: Insufficient documentation

## 2022-03-06 DIAGNOSIS — Z952 Presence of prosthetic heart valve: Secondary | ICD-10-CM | POA: Diagnosis not present

## 2022-03-06 NOTE — Progress Notes (Signed)
? ? ?Primary Care Physician: Kristen Loader, FNP ?Primary Cardiologist: Dr Johnsie Cancel ?Primary Electrophysiologist: none ?Referring Physician: Dr Marlou Porch ? ? ?Brandon [name] is a 71 y.o. male with a history of anemia, Parkinson's disease, endocarditis of mitral valve s/p MVR, HLD, hypothyroidism, atrial fibrillation, atrial flutter who presents for follow up in the [facility]. 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 The patient was diagnosed with atrial fibrillation postoperatively following MVR. He was on a short course of amiodarone but this was discontinued. Patient seen by Dr Marlou Porch on 08/06/21 for an irregular rhythm on his Kardia mobile device. ECG showed rate controlled typical atrial flutter. Patient was restarted on Eliquis for a CHADS2VASC score of 1 and underwent DCCV on 09/03/21.  ? ?On follow up today, patient reports that he has done very well since his last visit. He denies any heart racing or palpitations. He has a Chad mobile which he checks regularly and has only shown SR. He is active and exercises regularly.  ? ?Today, he denies symptoms of palpitations, chest pain, shortness of breath, orthopnea, PND, lower extremity edema, dizziness, presyncope, syncope, snoring, daytime somnolence, bleeding, or neurologic sequela. The patient is tolerating medications without difficulties and is otherwise without complaint today.  ? ? ?Atrial Fibrillation Risk Factors: ? ?he does not have symptoms or diagnosis of sleep apnea. ?he does not have a history of rheumatic fever. ?he does have a history of alcohol use. ?The patient does not have a history of early familial atrial fibrillation or other arrhythmias. ? ?he has a BMI of Body mass index is 26.94 kg/m?Marland KitchenMarland Kitchen ?Filed Weights  ? 03/06/22 0959  ?Weight: 92.6 kg  ? ? ? ?Family History  ?Problem Relation Age of Onset  ? Hypertension Mother   ? Breast cancer Mother   ? Heart disease Father   ? Emphysema Father   ? Heart attack Sister   ?  Heart attack Brother   ? ? ? ?Atrial Fibrillation Management history: ? ?Previous antiarrhythmic drugs: amiodarone  ?Previous cardioversions: 09/03/21 ?Previous ablations: none ?CHADS2VASC score: 1 ?Anticoagulation history: Eliquis ? ? ?Past Medical History:  ?Diagnosis Date  ? Anemia   ? Atrial fibrillation ([place])   ? Bacteremia due to Enterococcus   ? BPH (benign prostatic hyperplasia)   ? Cervical spine disease   ? Chest pain 2007  ? none since  ? COVID   ? Endocarditis of mitral valve   ? Gallbladder disorder   ? Hemopneumothorax on left   ? Hiatal hernia   ? Hypercholesteremia   ? Hyperlipidemia   ? Hyperthyroidism   ? Hypoxia   ? Meniere's disease   ? Parkinson disease ([place])   ? Pneumothorax on right   ? PONV (postoperative nausea and vomiting)   ? also has trouble waking up  ? S/P MVR (mitral valve replacement)   ? SOB (shortness of breath)   ? ?Past Surgical History:  ?Procedure Laterality Date  ? CARDIAC CATHETERIZATION    ? CARDIOVERSION N/A 09/03/2021  ? Procedure: CARDIOVERSION;  Surgeon: Josue Hector, MD;  [facility];  Service: Cardiovascular;  Laterality: N/A;  ? COLONOSCOPY    ? IR THORACENTESIS ASP PLEURAL SPACE W/IMG GUIDE  03/13/2021  ? ORIF CLAVICULAR FRACTURE Left 04/18/2016  ? Procedure: OPEN REDUCTION INTERNAL FIXATION (ORIF) LEFT CLAVICLE FRACTURE;  Surgeon: Justice Britain, MD;  Location: [place];  Service: Orthopedics;  Laterality: Left;  ? RIGHT/LEFT HEART CATH AND CORONARY ANGIOGRAPHY N/A 03/16/2021  ? Procedure: RIGHT/LEFT HEART CATH  AND CORONARY ANGIOGRAPHY;  Surgeon: Jolaine Artist, MD;  Location: [place] CV LAB;  Service: Cardiovascular;  Laterality: N/A;  ? TEE WITHOUT CARDIOVERSION N/A 03/14/2021  ? Procedure: TRANSESOPHAGEAL ECHOCARDIOGRAM (TEE);  Surgeon: Josue Hector, MD;  [facility]COPY;  Service: Cardiovascular;  Laterality: N/A;  ? THYROIDECTOMY    ? TONSILLECTOMY    ? ? ?Current Outpatient Medications  ?Medication Sig Dispense Refill  ? aspirin EC 81  MG tablet Take 1 tablet (81 mg total) by mouth daily. Swallow whole. 90 tablet 3  ? cholecalciferol (VITAMIN D) 1000 units tablet Take 1,000 Units by mouth daily.    ? fluticasone (FLONASE) 50 MCG/ACT nasal spray Place 1 spray into both nostrils daily as needed for allergies.    ? levothyroxine (SYNTHROID) 175 MCG tablet Take 175 mcg by mouth daily before breakfast.    ? Misc Natural Products (JOINT HEALTH PO) Take 1 tablet by mouth daily.    ? PROBIOTIC, LACTOBACILLUS, PO Take 1 capsule by mouth daily.    ? testosterone cypionate (DEPOTESTOSTERONE CYPIONATE) 200 MG/ML injection Inject 120 mg into the muscle every Saturday. 0.6 mL    ? traZODone (DESYREL) 100 MG tablet Take 50-100 mg by mouth at bedtime.    ? triamterene-hydrochlorothiazide (DYAZIDE) 37.5-25 MG capsule Take 1 capsule by mouth daily.    ? vitamin B-12 (CYANOCOBALAMIN) 1000 MCG tablet Take 1,000 mcg by mouth daily.    ? ?No current facility-administered medications for this encounter.  ? ? ?Allergies  ?Allergen Reactions  ? Ceftin [Cefuroxime Axetil] Diarrhea  ? Oxycodone Nausea And Vomiting  ? Prednisone & Diphenhydramine   ?  Other reaction(s): Unknown ?Other reaction(s): Unknown  ? Statins   ?  Other reaction(s): achy  felt  generally  terrible  ? Iohexol   ?  Causes pt to sneeze ?03/02/21 pt received prednisone and benadryl 1hr before CT Abd with contrast, tolerated w/o issue ?03/10/17 Patient started sneezing during the injection and continued sneezing after exam. ER doc notified and patient was given benadryl and cortisone  ? ? ?Social History  ? ?Socioeconomic History  ? Marital status: Widowed  ?  Spouse name: Not on file  ? Number of children: Not on file  ? Years of education: Not on file  ? Highest education level: Not on file  ?Occupational History  ?  Comment: factory work  ?Tobacco Use  ? Smoking status: Former  ?  Types: Cigarettes  ?  Quit date: 04/18/1979  ?  Years since quitting: 42.9  ? Smokeless tobacco: Never  ? Tobacco comments:  ?   Former smoker 09/05/2021  ?Vaping Use  ? Vaping Use: Never used  ?Substance and Sexual Activity  ? Alcohol use: Yes  ?  Alcohol/week: 4.0 standard drinks  ?  Types: 2 Glasses of wine, 2 Cans of beer per week  ?  Comment: 3 times a week (wine or beer) 03/06/22  ? Drug use: No  ? Sexual activity: Not on file  ?Other Topics Concern  ? Not on file  ?Social History Narrative  ? Two level home alone  ? Right handed  ? Caffeine - one cup coffee am   ? Exercise - yes walk dogs  ? Education - 1 year of college   ? ?Social Determinants of Health  ? ?Financial Resource Strain: Not on file  ?Food Insecurity: Not on file  ?Transportation Needs: Not on file  ?Physical Activity: Not on file  ?Stress: Not on file  ?Social Connections:  Not on file  ?Intimate Partner Violence: Not on file  ? ? ? ?ROS- All systems are reviewed and negative except as per the HPI above. ? ?Physical Exam: ?Vitals:  ? 03/06/22 0959  ?BP: (!) 144/90  ?Pulse: 82  ?Weight: 92.6 kg  ?Height: 6\' 1"  (1.854 m)  ? ? ? ?GEN- The patient is a well appearing male, alert and oriented x 3 today.   ?HEENT-head normocephalic, atraumatic, sclera clear, conjunctiva pink, hearing intact, trachea midline. ?Lungs- Clear to ausculation bilaterally, normal work of breathing ?Heart- Regular rate and rhythm, no murmurs, rubs or gallops  ?GI- soft, NT, ND, + BS ?Extremities- no clubbing, cyanosis, or edema ?MS- no significant deformity or atrophy ?Skin- no rash or lesion ?Psych- euthymic mood, full affect ?Neuro- strength and sensation are intact ? ? ?Wt Readings from Last 3 Encounters:  ?03/06/22 92.6 kg  ?01/29/22 95.8 kg  ?12/12/21 93.9 kg  ? ? ?EKG today demonstrates  ?SR ?Vent. rate 82 BPM ?PR interval 186 ms ?QRS duration 94 ms ?QT/QTcB 364/425 ms ? ?Echo 03/04/21 demonstrated  ? 1. There is a mass present on the anterior mitral valve leaflet  ?concerning for a vegetation that measures 1.4x1.0cm. Suspect moderate mitral regurgitation with two separate jets that are  posteriorly and anteriorly directed raising concern for possible leaflet perforation.The mean gradient across the MV is 19mmHg (HR 100) with MVA by continuity of 1.35cm2 suggesting at least mild mitral stenosis in

## 2022-08-13 NOTE — Progress Notes (Unsigned)
Assessment/Plan:   1.  Parkinsons Disease  -Patient wishes to hold off on medication for now.  We discussed AAN recommendations again today.  He has very mild disease (H &Y 1)  -Patient is following with Dr. Ronnald Ramp for dermatology.  He just saw him about a month ago.   Understands that Parkinson's slightly increases risk for melanoma  -Discussed exercise.  He has started back to that plan  2.  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  -Had postoperative atrial fibrillation, now resolved, status post cardioversion  -Has been on metoprolol since the atrial fibrillation and we will need to watch that given tendency for Parkinson's to lower blood pressure.  3.  History of hypertension  -On Dyazide in addition to the metoprolol mentioned above.  As above, will need to watch given tendency in Parkinson's for orthostasis.   Subjective:   Brandon [name] was seen today in follow up for Parkinsons disease.  My previous records were reviewed prior to todays visit as well as outside records available to me.  Patient is on no medication for Parkinson's disease, by his choice.  He continues to exercise and feels well.  Last saw cardiology PA in April.  Notes are reviewed.  Current prescribed movement disorder medications: None   PREVIOUS MEDICATIONS: none to date  ALLERGIES:   Allergies  Allergen Reactions   Ceftin [Cefuroxime Axetil] Diarrhea   Oxycodone Nausea And Vomiting   Prednisone & Diphenhydramine     Other reaction(s): Unknown Other reaction(s): Unknown   Statins     Other reaction(s): achy  felt  generally  terrible   Iohexol     Causes pt to sneeze 03/02/21 pt received prednisone and benadryl 1hr before CT Abd with contrast, tolerated w/o issue 03/10/17 Patient started sneezing during the injection and continued sneezing after exam. ER doc notified and patient was given benadryl and cortisone    CURRENT MEDICATIONS:  Outpatient Encounter  Medications as of 08/15/2022  Medication Sig   aspirin EC 81 MG tablet Take 1 tablet (81 mg total) by mouth daily. Swallow whole.   cholecalciferol (VITAMIN D) 1000 units tablet Take 1,000 Units by mouth daily.   fluticasone (FLONASE) 50 MCG/ACT nasal spray Place 1 spray into both nostrils daily as needed for allergies.   levothyroxine (SYNTHROID) 175 MCG tablet Take 175 mcg by mouth daily before breakfast.   Misc Natural Products (JOINT HEALTH PO) Take 1 tablet by mouth daily.   PROBIOTIC, LACTOBACILLUS, PO Take 1 capsule by mouth daily.   testosterone cypionate (DEPOTESTOSTERONE CYPIONATE) 200 MG/ML injection Inject 120 mg into the muscle every Saturday. 0.6 mL   traZODone (DESYREL) 100 MG tablet Take 50-100 mg by mouth at bedtime.   triamterene-hydrochlorothiazide (DYAZIDE) 37.5-25 MG capsule Take 1 capsule by mouth daily.   vitamin B-12 (CYANOCOBALAMIN) 1000 MCG tablet Take 1,000 mcg by mouth daily.   No facility-administered encounter medications on file as of 08/15/2022.    Objective:   PHYSICAL EXAMINATION:    VITALS:   There were no vitals filed for this visit.     GEN:  The patient appears stated age and is in NAD. HEENT:  Normocephalic, atraumatic.  The mucous membranes are moist. The superficial temporal arteries are without ropiness or tenderness.  Neurological examination:  Orientation: The patient is alert and oriented x3. Cranial nerves: There is good facial symmetry with min facial hypomimia. The speech is fluent and clear. Soft palate rises symmetrically and there is  no tongue deviation. Hearing is intact to conversational tone. Sensation: Sensation is intact to light touch throughout Motor: Strength is at least antigravity x4.  Movement examination: Tone: There is mild increased tone on the right. Abnormal movements: no tremor today Coordination:  There is no decremation today Gait and Station: The patient has no difficulty arising out of a deep-seated chair  without the use of the hands. The patient's stride length is good   I have reviewed and interpreted the following labs independently    Chemistry      Component Value Date/Time   NA 138 08/06/2021 1536   K 4.9 08/06/2021 1536   CL 99 08/06/2021 1536   CO2 25 08/06/2021 1536   BUN 19 08/06/2021 1536   CREATININE 1.20 08/06/2021 1536      Component Value Date/Time   CALCIUM 9.0 08/06/2021 1536   ALKPHOS 92 03/12/2021 0825   AST 19 03/12/2021 0825   ALT 38 03/12/2021 0825   BILITOT 1.0 03/12/2021 0825       Lab Results  Component Value Date   WBC 7.2 08/06/2021   HGB 14.7 08/06/2021   HCT 45.3 08/06/2021   MCV 90 08/06/2021   PLT 283 08/06/2021    Lab Results  Component Value Date   TSH 2.550 03/04/2021      Cc:  Soundra Pilon, FNP

## 2022-08-15 ENCOUNTER — Ambulatory Visit: Payer: Medicare Other | Admitting: Neurology

## 2022-08-15 ENCOUNTER — Encounter: Payer: Self-pay | Admitting: Neurology

## 2022-08-15 VITALS — BP 120/86 | HR 81 | Ht 73.0 in | Wt 207.0 lb

## 2022-08-15 DIAGNOSIS — G20 Parkinson's disease: Secondary | ICD-10-CM

## 2022-08-15 NOTE — Patient Instructions (Signed)
Local and Online Resources for Power over Parkinson's Group September 2023  LOCAL [place] PARKINSON'S GROUPS  Power over Parkinson's Group:   Power Over Parkinson's Patient Education Group will be Wednesday, September 13th-*Hybrid meting*- in person at [facility] location and via WEBEX at 2 pm.   Upcoming Power over Parkinson's Meetings:  2nd Wednesdays of the month at 2 pm:  September 13th, October 11th, November 8th Contact Amy Marriott at [email or web address] if interested in participating in this group Parkinson's Care Partners Group:    3rd Mondays, Contact Misty Paladino Atypical Parkinsonian Patient Group:   4th Wednesdays, Contact Misty Paladino If you are interested in participating in these groups with Misty, please contact her directly for how to join those meetings.  Her contact information is [email or web address].    LOCAL EVENTS AND NEW OFFERINGS New PWR! Moves Community Fitness Instructor-Led Classes offering at [employer]!  Wednesdays 1-2 pm.   Contact Christy Weaver at  [email or web address] or Mike Sabin at [employer], [email or web address] Dance for Parkinson 's classes will be on Tuesdays 9:30am-10:30am starting October 3-December 12 with a break the week of November 21 . Located in the [facility] which is in the first floor of the [facility] ([address]  Drumming for Parkinson's will be held on 2nd and 4th Mondays at 11:00am . First class will start  September 25th.  Located at the [employer] ([address] [place] Through support from the Parkinson's Foundation, the Dance and Drumming for Parkinson's classes are free for both patients and caregivers.  Contact Misty Taylor-Paladino for more details about registering.  ONLINE EDUCATION AND SUPPORT Parkinson Foundation:  www.parkinson.org PD Health at Home continues:  Mindfulness Mondays, Wellness  Wednesdays, Fitness Fridays  Upcoming Education:   Navigating Nutrition with PD.  Wednesday, Sept. 6th 1:00-2:00 pm Understanding Mind and Memory.  Wednesday, Sept. 20th 1:00-2:00 pm  Expert Briefing:    Parkinson's Disease and the Bladder.  Wednesday, Sept. 13th 1:00-2:00 pm Parkinson's and the Gut-Brain Connection.  Wednesday, Oct. 11th 1:00-2:00 pm Register for expert briefings (webinars) at [email or web address]://www.[email or web address].org/resources-support/online-education/expert-briefings-webinars Please check out their website to sign up for emails and see their full online offerings   Michael J Fox Foundation:  www.michaeljfox.org  Third Thursday Webinars:  On the third Thursday of every month at 12 p.m. ET, join our free live webinars to learn about various aspects of living with Parkinson's disease and our work to speed medical breakthroughs. Upcoming Webinar:  Stay tuned Check out additional information on their website to see their full online offerings  Davis Phinney Foundation:  www.davisphinneyfoundation.org Upcoming Webinar:   Stay tuned Webinar Series:  Living with Parkinson's Meetup.   Third Thursdays each month, 3 pm Care Partner Monthly Meetup.  With Connie Carpenter Phinney.  First Tuesday of each month, 2 pm Check out additional information to Live Well Today on their website  Parkinson and Movement Disorders (PMD) Alliance:  www.pmdalliance.org [employer]Life Online:  Online Education Events Sign up for emails, which are sent weekly to give you updates on programming and online offerings  Parkinson's Association of [place]:  www.parkinsonassociation.org Information on online support groups, education events, and online exercises including Yoga, Parkinson's exercises and more-LOTS of information on links to PD resources and online events Virtual Support Group through Parkinson's Association of [place]; next one is scheduled for Wednesday, October 4th at 2 pm. (No September meeting  due to the symposium.  These are typically scheduled for the 1st Wednesday   of the month at 2 pm).  Visit website for details. Register for "Caring for Parkinson's-Caring for You", 9th Annual Symposium.  In-person event in [place].  September 9th.  To register:  www.parkinsonassociation.org/symposium-registration/?blm_aid=45150 MOVEMENT AND EXERCISE OPPORTUNITIES PWR! Moves Classes at [facility].  Wednesdays 10 and 11 am.   Contact Amy Marriott, PT [email or web address] if interested. NEW PWR! Moves Class offerings at [employer].  Wednesdays 1-2 pm.  Contact Christy Weaver at  [email or web address] or Mike Sabin at [employer],  [email or web address] Parkinson's Wellness Recovery (PWR! Moves)  www.pwr4life.org Info on the PWR! Virtual Experience:  You will have access to our expertise through self-assessment, guided plans that start with the PD-specific fundamentals, educational content, tips, Q&A with an expert, and a growing library of PD-specific pre-recorded and live exercise classes of varying types and intensity - both physical and cognitive! If that is not enough, we offer 1:1 wellness consultations (in-person or virtual) to personalize your PWR! Virtual Experience.  Parkinson Foundation Fitness Fridays:  As part of the PD Health @ Home program, this free video series focuses each week on one aspect of fitness designed to support people living with Parkinson's.  These weekly videos highlight the Parkinson Foundation recent fitness guidelines for people with Parkinson's disease. www.parkinson.org/resources-support/online-education/pdhealth#ff Dance for PD website is offering free, live-stream classes throughout the week, as well as links to digital library of classes:  https://danceforparkinsons.org/ Virtual dance and Pilates for Parkinson's classes: Click on the Community Tab> Parkinson's Movement Initiative Tab.  To register for classes and for more  information, visit www.americandancefestival.org and click the "community" tab.  [employer] Parkinson's Cycling Classes  [employer]:  Thursdays @ Noon-Live classes at [employer] (Contact Margaret Hazen at [email or web address] or [phone]) [employer]: Virtual Classes Mondays and Thursdays /Live classes Tuesday, Wednesday and Thursday (contact Marlee at [email or web address]  or [phone]) [place] [employer] Varied levels of classes are offered Tuesdays and Thursdays at [facility].  Stretching with Maria weekly class is also offered for people with Parkinson's To observe a class or for more information, call [phone] or email Hillary Savage at [email or web address] ADDITIONAL SUPPORT AND RESOURCES [facility] [employer]:Online Caregiver Education Opportunities:  www.well-springsolutions.org/caregiver-education/caregiver-support-group.  You may also contact Jodi Kolada at [email or web address] or [phone].    Coping with Difficult Caregiver Emotions.  Wednesday, September 20th, 10:30 am-12.  [facility] Navigating the Maze of Senior Care Options.  Thursday, September 28th, 4-5:15 pm.  [facility]. [facility] Navigator:  Just1Navigator program, a free service to help individuals and families through the journey of determining care for older adults.  The "Navigator" is a social worker, Nicole Reynolds, who will speak with a prospective client and/or loved ones to provide an assessment of the situation and a set of recommendations for a personalized care plan -- all free of charge, and whether [facility] offers the needed service or not. If the need is not a service we provide, we are well-connected with reputable programs in town that we can refer you to.  www.well-springsolutions.org or to speak with the Navigator, call [phone].  

## 2022-12-06 DIAGNOSIS — R3912 Poor urinary stream: Secondary | ICD-10-CM | POA: Diagnosis not present

## 2023-01-08 DIAGNOSIS — H52203 Unspecified astigmatism, bilateral: Secondary | ICD-10-CM | POA: Diagnosis not present

## 2023-01-08 DIAGNOSIS — H1789 Other corneal scars and opacities: Secondary | ICD-10-CM | POA: Diagnosis not present

## 2023-01-08 DIAGNOSIS — H2513 Age-related nuclear cataract, bilateral: Secondary | ICD-10-CM | POA: Diagnosis not present

## 2023-01-08 DIAGNOSIS — H43812 Vitreous degeneration, left eye: Secondary | ICD-10-CM | POA: Diagnosis not present

## 2023-01-08 DIAGNOSIS — H524 Presbyopia: Secondary | ICD-10-CM | POA: Diagnosis not present

## 2023-01-21 DIAGNOSIS — G4733 Obstructive sleep apnea (adult) (pediatric): Secondary | ICD-10-CM | POA: Diagnosis not present

## 2023-03-06 ENCOUNTER — Encounter: Payer: Self-pay | Admitting: Neurology

## 2023-04-03 DIAGNOSIS — H6121 Impacted cerumen, right ear: Secondary | ICD-10-CM | POA: Diagnosis not present

## 2023-04-03 DIAGNOSIS — H8102 Meniere's disease, left ear: Secondary | ICD-10-CM | POA: Diagnosis not present

## 2023-04-08 DIAGNOSIS — G4733 Obstructive sleep apnea (adult) (pediatric): Secondary | ICD-10-CM | POA: Diagnosis not present

## 2023-04-24 DIAGNOSIS — G20C Parkinsonism, unspecified: Secondary | ICD-10-CM | POA: Diagnosis not present

## 2023-04-24 DIAGNOSIS — I480 Paroxysmal atrial fibrillation: Secondary | ICD-10-CM | POA: Diagnosis not present

## 2023-04-24 DIAGNOSIS — E7800 Pure hypercholesterolemia, unspecified: Secondary | ICD-10-CM | POA: Diagnosis not present

## 2023-04-24 DIAGNOSIS — D582 Other hemoglobinopathies: Secondary | ICD-10-CM | POA: Diagnosis not present

## 2023-04-24 DIAGNOSIS — N1831 Chronic kidney disease, stage 3a: Secondary | ICD-10-CM | POA: Diagnosis not present

## 2023-04-24 DIAGNOSIS — I272 Pulmonary hypertension, unspecified: Secondary | ICD-10-CM | POA: Diagnosis not present

## 2023-04-25 DIAGNOSIS — N1831 Chronic kidney disease, stage 3a: Secondary | ICD-10-CM | POA: Diagnosis not present

## 2023-05-22 ENCOUNTER — Ambulatory Visit: Payer: Medicare Other | Admitting: Neurology

## 2023-05-22 NOTE — Progress Notes (Signed)
Cardiology Office Note    Date:  06/02/2023   ID:  Brandon [name], DOB [birth year], MRN [id number]   PCP:  Soundra Pilon, FNP   [facility]  Cardiologist:  Charlton Haws, MD   Advanced Practice Provider:  No care team member to display    History of Present Illness:  Brandon [name] is a 72 y.o. male  with a history of anemia, Parkinson's disease, endocarditis of mitral valve s/p MVR, HLD, hypothyroidism, atrial fibrillation, atrial flutter. Patient had bioprosthetic MVR 03/23/21 at [facility]. The patient was diagnosed with atrial fibrillation postoperatively following MVR. He was on a short course of amiodarone but this was discontinued. Patient seen by Dr Anne Fu on 08/06/21 for an irregular rhythm on his Kardia mobile device. ECG showed rate controlled typical atrial flutter. Patient was restarted on Eliquis for a CHADS2VASC score of 1 and underwent DCCV on 09/03/21.   Patient seen in the Afib clinic 09/05/21 and in NSR. Eliquis could be stopped after 4 weeks with CHADSVASC 1.  Patient comes in for f/u. Checks Lourena Simmonds a couple times weekly and has been in NSR. Resting HR about 82/m. Dneies chest pain, dyspnea, palpitations, dizziness or presyncope. Rides elliptical 30 min 3 days/week. Trying to get cholesterol down with diet and exercise. LDL  has been in 160 range in past with no recent labs in Epic.  Started on ACE by primary ? For renal protection Has a [place] fishing trip next week in [place] Seeing Dr Tat for parkinson's Stable  Past Medical History:  Diagnosis Date   Anemia    Atrial fibrillation (HCC)    Bacteremia due to Enterococcus    BPH (benign prostatic hyperplasia)    Cervical spine disease    Chest pain 2007   none since   COVID    Endocarditis of mitral valve    Gallbladder disorder    Hemopneumothorax on left    Hiatal hernia    Hypercholesteremia    Hyperlipidemia    Hyperthyroidism    Hypoxia    Meniere's disease    Parkinson  disease    Pneumothorax on right    PONV (postoperative nausea and vomiting)    also has trouble waking up   S/P MVR (mitral valve replacement)    SOB (shortness of breath)     Past Surgical History:  Procedure Laterality Date   CARDIAC CATHETERIZATION     CARDIOVERSION N/A 09/03/2021   Procedure: CARDIOVERSION;  Surgeon: Wendall Stade, MD;  Location: [facility] ENDOSCOPY;  Service: Cardiovascular;  Laterality: N/A;   COLONOSCOPY     IR THORACENTESIS ASP PLEURAL SPACE W/IMG GUIDE  03/13/2021   ORIF CLAVICULAR FRACTURE Left 04/18/2016   Procedure: OPEN REDUCTION INTERNAL FIXATION (ORIF) LEFT CLAVICLE FRACTURE;  Surgeon: Francena Hanly, MD;  Location: [facility] OR;  Service: Orthopedics;  Laterality: Left;   RIGHT/LEFT HEART CATH AND CORONARY ANGIOGRAPHY N/A 03/16/2021   Procedure: RIGHT/LEFT HEART CATH AND CORONARY ANGIOGRAPHY;  Surgeon: Dolores Patty, MD;  Location: [facility] INVASIVE CV LAB;  Service: Cardiovascular;  Laterality: N/A;   TEE WITHOUT CARDIOVERSION N/A 03/14/2021   Procedure: TRANSESOPHAGEAL ECHOCARDIOGRAM (TEE);  Surgeon: Wendall Stade, MD;  Location[facility] ENDOSCOPY;  Service: Cardiovascular;  Laterality: N/A;   THYROIDECTOMY     TONSILLECTOMY      Current Medications: Current Meds  Medication Sig   aspirin EC 81 MG tablet Take 1 tablet (81 mg total) by mouth daily. Swallow whole.   cholecalciferol (VITAMIN D)  1000 units tablet Take 1,000 Units by mouth daily.   fluticasone (FLONASE) 50 MCG/ACT nasal spray Place 1 spray into both nostrils daily as needed for allergies.   levothyroxine (SYNTHROID) 175 MCG tablet Take 175 mcg by mouth daily before breakfast.   lisinopril (ZESTRIL) 2.5 MG tablet 1 tablet Orally Once a day for 30 days   Misc Natural Products (JOINT HEALTH PO) Take 1 tablet by mouth daily.   PROBIOTIC, LACTOBACILLUS, PO Take 1 capsule by mouth daily.   testosterone cypionate (DEPOTESTOSTERONE CYPIONATE) 200 MG/ML injection Inject 120 mg into the muscle every Saturday. 0.6  mL   traZODone (DESYREL) 100 MG tablet Take 50-100 mg by mouth at bedtime.   triamterene-hydrochlorothiazide (DYAZIDE) 37.5-25 MG capsule Take 1 capsule by mouth daily.   vitamin B-12 (CYANOCOBALAMIN) 1000 MCG tablet Take 1,000 mcg by mouth daily.     Allergies:   Ceftin [cefuroxime axetil], Oxycodone, Prednisone & diphenhydramine, Statins, and Iohexol   Social History   Socioeconomic History   Marital status: Widowed    Spouse name: Not on file   Number of children: Not on file   Years of education: Not on file   Highest education level: Not on file  Occupational History    Comment: factory work  Tobacco Use   Smoking status: Former    Types: Cigarettes    Quit date: 04/18/1979    Years since quitting: 44.1   Smokeless tobacco: Never   Tobacco comments:    Former smoker 09/05/2021  Vaping Use   Vaping Use: Never used  Substance and Sexual Activity   Alcohol use: Yes    Alcohol/week: 4.0 standard drinks of alcohol    Types: 2 Glasses of wine, 2 Cans of beer per week    Comment: 3 times a week (wine or beer) 03/06/22   Drug use: No   Sexual activity: Not on file  Other Topics Concern   Not on file  Social History Narrative   Two level home alone   Right handed   Caffeine - one cup coffee am    Exercise - yes walk dogs   Education - 1 year of college    Social Determinants of Health   Financial Resource Strain: Not on file  Food Insecurity: Not on file  Transportation Needs: Not on file  Physical Activity: Not on file  Stress: Not on file  Social Connections: Not on file     Family History:  The patient's  family history includes Breast cancer in his mother; Emphysema in his father; Heart attack in his brother and sister; Heart disease in his father; Hypertension in his mother.   ROS:   Please see the history of present illness.    ROS All other systems reviewed and are negative.   PHYSICAL EXAM:   VS:  BP 138/80   Pulse 72   Ht 6\' 1"  (1.854 m)   Wt 204  lb 9.6 oz (92.8 kg)   SpO2 98%   BMI 26.99 kg/m   Physical Exam  GEN: Well nourished, well developed, in no acute distress  Neck: no JVD, carotid bruits, or masses Cardiac:RRR; no murmurs, rubs, or gallops  Respiratory:  clear to auscultation bilaterally, normal work of breathing GI: soft, nontender, nondistended, + BS Ext: without cyanosis, clubbing, or edema, Good distal pulses bilaterally Neuro:  Alert and Oriented x 3 Psych: euthymic mood, full affect  Wt Readings from Last 3 Encounters:  06/02/23 204 lb 9.6 oz (92.8 kg)  05/28/23 205  lb (93 kg)  08/15/22 207 lb (93.9 kg)      Studies/Labs Reviewed:   EKG:  EKG is not ordered today.     Recent Labs: No results found for requested labs within last 365 days.   Lipid Panel No results found for: "CHOL", "TRIG", "HDL", "CHOLHDL", "VLDL", "LDLCALC", "LDLDIRECT"  Additional studies/ records that were reviewed today include:    Echo 03/04/2021:    1. There is a mass present on the anterior mitral valve leaflet  concerning for a vegetation that measures 1.4x1.0cm. Suspect moderate mitral regurgitation with two separate jets that are posteriorly and anteriorly directed raising concern for possible  leaflet perforation.The mean gradient across the MV is (HR 100) with MVA by continuity of 1.35cm2 suggesting at least mild mitral stenosis in the setting of the mitral valve vegetation as detailed above. Recommend TEE for further evaluation.   2. Left ventricular ejection fraction, by estimation, is 60 to 65%. The left ventricle has normal function. The left ventricle has no regional wall motion abnormalities. There is moderate asymmetric left ventricular hypertrophy of the basal-septal  segment. There is mild LVH of the rest of the LV segments. Left  ventricular diastolic parameters are consistent with Grade I diastolic dysfunction (impaired relaxation).   3. Right ventricular systolic function is normal. The right ventricular  size is normal. There is severely elevated pulmonary artery systolic pressure. The estimated right ventricular systolic pressure is 71.0 mmHg.   4. The aortic valve is tricuspid. There is mild calcification of the  aortic valve. There is moderate thickening of the aortic valve. Aortic valve regurgitation is mild. Mild to moderate aortic valve  sclerosis/calcification is present, without any  evidence of aortic stenosis. No distinct vegetations visualized, but the valve is thickened.   5. The inferior vena cava is normal in size with greater than 50% respiratory variability, suggesting right atrial pressure of 3 mmHg.   Coronary Angio 03/16/2021:   1. Mild non-obstructive CAD 2. Normal EF 60% 3. Severe v-waves in PCW tracing in setting of known severe MR 4. COVID PNA   TEE 03/14/2021:   1. Left ventricular ejection fraction, by estimation, is 55 to 60%. The left ventricle has normal function.   2. Right ventricular systolic function is normal. The right ventricular size is normal.   3. Left atrial size was mildly dilated. No left atrial/left atrial  appendage thrombus was detected.   4. Flail A2 segment with perforation at the base of A2 and large  vegetation 2.2 cm on ventricular side extending into papillary muscle. Intervalvular fibrosa intact Also appears to be small vegetation on P2. Findings discussed with CVTS Dr Dorris Fetch. Extensive 3D imaging of MV was performed . The mitral valve is abnormal. 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   5. The aortic valve is tricuspid. There is mild calcification of the  aortic valve. Aortic valve regurgitation is trivial. Mild aortic valve  sclerosis is present, with no evidence of aortic valve stenosis.   PLAN:  In order of problems listed above:  Atrial flutter/fib S/P DCCV 09/03/21 on metoprolol prn, no anticoag because of CHADSVASC=1-Kardia mobile home monitoring.Maintaining NSR without recurrence  Bioprosthetic MVR 31 mm  for  endocarditis 03/23/21 [facility]-doing well, no murmur will update TTE SBE prophylaxis   HTN on dyazide  HLD-LDL 160 range in past. He has had myalgias on statin.and generally felt terrible Had no obstructive CAD on cath 03/16/21 prior to MVR suggested calcium score to further risk stratify and guide Rx  of LDL  Parkinson's not on sinemet f/u neurology Dr Tat  Family history of early CAD:  see above regarding calcium score no obstructive CAD on cath prior to MVR    Shared Decision Making/Informed Consent       Echo post MVR Calcium score Lipid/Liver/ BMET  F/U in a year   Signed, Charlton Haws, MD  06/02/2023 9:16 AM    [facility] [address] [place], [place], [place]  [address] Phone: [phone]; Fax: [phone]

## 2023-05-26 NOTE — Progress Notes (Unsigned)
Assessment/Plan:   1.  Parkinsons Disease  -Patient doesn't want to be on medication.  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 Yetta Barre for dermatology.  He saw him a month ago   Understands that Parkinson's slightly increases risk for melanoma.  Discussed sunscreen  -Discussed exercise.    2.  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  -Had postoperative atrial fibrillation, now resolved, status post cardioversion  Subjective:   Brandon [name] was seen today in follow up for Parkinsons disease.  My previous records were reviewed prior to todays visit as well as outside records available to me.  He is currently on no medicine.  He feels he is doing well.  He continues to exercise.  He is heading to the gym when he leaves my office.  He is also golfing and fishing for exercise.    Current prescribed movement disorder medications: None   PREVIOUS MEDICATIONS: none to date  ALLERGIES:   Allergies  Allergen Reactions   Ceftin [Cefuroxime Axetil] Diarrhea   Oxycodone Nausea And Vomiting   Prednisone & Diphenhydramine     Other reaction(s): Unknown Other reaction(s): Unknown   Statins     Other reaction(s): achy  felt  generally  terrible   Iohexol     Causes pt to sneeze 03/02/21 pt received prednisone and benadryl 1hr before CT Abd with contrast, tolerated w/o issue 03/10/17 Patient started sneezing during the injection and continued sneezing after exam. ER doc notified and patient was given benadryl and cortisone    CURRENT MEDICATIONS:  Outpatient Encounter Medications as of 05/28/2023  Medication Sig   aspirin EC 81 MG tablet Take 1 tablet (81 mg total) by mouth daily. Swallow whole.   cholecalciferol (VITAMIN D) 1000 units tablet Take 1,000 Units by mouth daily.   fluticasone (FLONASE) 50 MCG/ACT nasal spray Place 1 spray into both nostrils daily as needed for  allergies.   levothyroxine (SYNTHROID) 175 MCG tablet Take 175 mcg by mouth daily before breakfast.   Misc Natural Products (JOINT HEALTH PO) Take 1 tablet by mouth daily.   PROBIOTIC, LACTOBACILLUS, PO Take 1 capsule by mouth daily.   testosterone cypionate (DEPOTESTOSTERONE CYPIONATE) 200 MG/ML injection Inject 120 mg into the muscle every Saturday. 0.6 mL   traZODone (DESYREL) 100 MG tablet Take 50-100 mg by mouth at bedtime.   triamterene-hydrochlorothiazide (DYAZIDE) 37.5-25 MG capsule Take 1 capsule by mouth daily.   vitamin B-12 (CYANOCOBALAMIN) 1000 MCG tablet Take 1,000 mcg by mouth daily.   No facility-administered encounter medications on file as of 05/28/2023.    Objective:   PHYSICAL EXAMINATION:    VITALS:   There were no vitals filed for this visit.    GEN:  The patient appears stated age and is in NAD. HEENT:  Normocephalic, atraumatic.  The mucous membranes are moist. The superficial temporal arteries are without ropiness or tenderness.   Neurological examination:  Orientation: The patient is alert and oriented x3. Cranial nerves: There is good facial symmetry with min facial hypomimia. The speech is fluent and clear. Soft palate rises symmetrically and there is no tongue deviation. Hearing is intact to conversational tone. Sensation: Sensation is intact to light touch throughout Motor: Strength is at least antigravity x4.    Movement examination: Tone: There is mild increased tone on the right. Abnormal movements: no tremor today, with rest or posture Coordination:  There is  no decremation today, with any form of RAMS, including alternating supination and pronation of the forearm, hand opening and closing, finger taps, heel taps and toe taps.  Gait and Station: The patient has no difficulty arising out of a deep-seated chair without the use of the hands. The patient's stride length is good     I have reviewed and interpreted the following labs  independently    Chemistry      Component Value Date/Time   NA 138 08/06/2021 1536   K 4.9 08/06/2021 1536   CL 99 08/06/2021 1536   CO2 25 08/06/2021 1536   BUN 19 08/06/2021 1536   CREATININE 1.20 08/06/2021 1536      Component Value Date/Time   CALCIUM 9.0 08/06/2021 1536   ALKPHOS 92 03/12/2021 0825   AST 19 03/12/2021 0825   ALT 38 03/12/2021 0825   BILITOT 1.0 03/12/2021 0825       Lab Results  Component Value Date   WBC 7.2 08/06/2021   HGB 14.7 08/06/2021   HCT 45.3 08/06/2021   MCV 90 08/06/2021   PLT 283 08/06/2021    Lab Results  Component Value Date   TSH 2.550 03/04/2021      Cc:  Soundra Pilon, FNP

## 2023-05-28 ENCOUNTER — Ambulatory Visit: Payer: Medicare Other | Admitting: Neurology

## 2023-05-28 ENCOUNTER — Encounter: Payer: Self-pay | Admitting: Neurology

## 2023-05-28 VITALS — BP 138/78 | HR 99 | Resp 20 | Ht 73.0 in | Wt 205.0 lb

## 2023-05-28 DIAGNOSIS — G20A1 Parkinson's disease without dyskinesia, without mention of fluctuations: Secondary | ICD-10-CM | POA: Diagnosis not present

## 2023-05-28 NOTE — Patient Instructions (Signed)
Local and Online Resources for Power over Parkinson's Group  June 2024   LOCAL [place] PARKINSON'S GROUPS   Power over Parkinson's Group:    Power Over Parkinson's Patient Education Group will be Wednesday, JULY 10th-*PLEASE NOTE:  We will not be having a June meeting.  Our next meeting will be in July.  We apologize for the inconvenience.   Power over Parkinson's and Care Partner Groups will meet together, with plans for separate break out session for caregivers, depending on topic/speaker Upcoming Power over Parkinson's Meetings/Care Partner Support:  2nd Wednesdays of the month at 2 pm:   No regular June meeting, July 10th  Contact Amy Marriott at [email or web address] or Sarah Chambers at [email or web address] if interested in participating in this group    LOCAL EVENTS AND NEW OFFERINGS  PLEASE NOTE:  There will be no June Power over Parkinson's meeting (NO MEETING June 12th) Picnic Party for Parkinson's.  [facility] Movement Disorders Program Celebration.  June 19th 1-3 pm.  Contact Sarah Chambers at [email or web address] Parkinson's Social Game Night.  First Thursday of each month, 2:00-4:00 pm.  *Next date is June 6th*.  [facility], [place].  Contact [email or web address] if interested. Parkinson's CarePartner Group for Men is in the works, if interested email [email or web address] [employer] Chair Yoga classes "Train and Gain", Fridays 10 am, [employer].  Contact Gina at [phone].  Community Fitness Instructor-Led Parkinson's Exercises Classes offering at [employer]!  TUESDAYS (Chair Yoga)  and Wednesdays (PWR! Moves)  1:00 pm.   Contact Christy Weaver at  [email or web address]  or [phone]  [place] PWR! Moves classes.  Thursdays at 11:45, [employer].  Free to participate through Parkinson Foundation grant.  Contact Sarah Chambers at [email or web address] or  [phone] to register Drumming for Parkinson's will be held on 2nd and 4th Mondays at 11:00 am.   Located at the [employer] ([address] [place].)  Contact Jane Maydian at [email or web address] or [phone]  [employer] Parkinson's Tai Chi Class, Mondays at 11 am.  Call [phone] for details   ONLINE EDUCATION AND SUPPORT  Parkinson Foundation:  www.parkinson.org  PD Health at Home continues:  Mindfulness Mondays, Wellness Wednesdays, Fitness Fridays  (PWR! Moves as part of Fitness Fridays March 22nd, 1-1:45 pm) Upcoming Education:   Current and Emerging Methods to Aid Parkinson's Diagnosis.  Wednesday, May 29th, 1-2 pm Recognize and Respond to Parkinson's Psychosis.  Wednesday, June 5th, 1-2 pm Parkinsonisms.  Wednesday, June 12th, 1-2 pm Expert Briefing:  Addressing the Challenge of Apathy in Parkinson's.  Wednesday, September 11th, 1-2 pm. Register for virtual education and expert briefings (webinars) at www.parkinson.org/resources-support/online-education Please check out their website to sign up for emails and see their full online offerings     Michael [employer] Foundation:  www.michaeljfox.org   Third Thursday Webinars:  On the third Thursday of every month at 12 p.m. ET, join our free live webinars to learn about various aspects of living with Parkinson's disease and our work to speed medical breakthroughs.  Upcoming Webinar:  You Want to Volunteer for Parkinson's Research: Now What?  Thursday, June 20th at 12 noon. Check out additional information on their website to see their full online offerings    Davis Phinney Foundation:  www.davisphinneyfoundation.org  Upcoming Webinar:  Living Safely with Parkinson's Inside and Outside of your Home.  Thursday, June 13th , 3 pm Series:  Living with Parkinson's Meetup.   Third Thursdays each month, 3 pm    Care Partner Monthly Meetup.  With Connie Carpenter Phinney.  First Tuesday of each month, 2  pm  Check out additional information to Live Well Today on their website    Parkinson and Movement Disorders (PMD) Alliance:  www.pmdalliance.org  [employer]:  Online Education Events  Sign up for emails, which are sent weekly to give you updates on programming and online offerings    Parkinson's Association of [place]:  www.parkinsonassociation.org  Information on online support groups, education events, and online exercises including Yoga, Parkinson's exercises and more-LOTS of information on links to PD resources and online events  Virtual Support Group through Parkinson's Association of [place]; next one is scheduled for Wednesday, June 5th   MOVEMENT AND EXERCISE OPPORTUNITIES  Parkinson's Exercise Class offerings at [employer]. *TUESDAYS* (Chair yoga) and Wednesdays (PWR! Moves)  1:00 pm.   *Please note that PWR! Moves [place] class has ended.  Please consider trying PWR! Moves on Wednesdays at 1 pm at [employer].  Contact Christy Weaver at [email or web address]    Parkinson's Wellness Recovery (PWR! Moves)  www.pwr4life.org  Info on the PWR! Virtual Experience:  You will have access to our expertise?through self-assessment, guided plans that start with the PD-specific fundamentals, educational content, tips, Q&A with an expert, and a growing library of PD-specific pre-recorded and live exercise classes of varying types and intensity - both physical and cognitive! If that is not enough, we offer 1:1 wellness consultations (in-person or virtual) to personalize your PWR! Virtual Experience.   Parkinson Foundation Fitness Fridays:   As part of the PD Health @ Home program, this free video series focuses each week on one aspect of fitness designed to support people living with Parkinson's.? These weekly videos highlight the Parkinson Foundation fitness guidelines for people with Parkinson's disease.   www.parkinson.org/resources-support/online-education/pdhealth#ff  Dance for PD website is offering free, live-stream classes throughout the week, as well as links to digital library of classes:  https://danceforparkinsons.org/  Virtual dance and Pilates for Parkinson's classes: Click on the Community Tab> Parkinson's Movement Initiative Tab.  To register for classes and for more information, visit www.americandancefestival.org and click the "community" tab.   [employer] Parkinson's Cycling Classes   [employer]:  Thursdays @ Noon-Live classes at [employer] (Contact Margaret Hazen at [email or web address]?or [phone])  [facility]: Classes Tuesday, Wednesday and Thursday (contact Marlee at [email or web address] ?or [phone])  [place] [employer]  Varied levels of classes are offered Tuesdays and Thursdays at [employer] [facility] [facility].   Stretching with Maria weekly class is also offered for people with Parkinson's  To observe a class or for more information, call [phone] or email Hillary Savage at [email or web address]   ADDITIONAL SUPPORT AND RESOURCES  [employer]:  Online Caregiver Education Opportunities:  www.well-springsolutions.org/caregiver-education/caregiver-support-group.  You may also contact Jodi Kolada at [email or web address] or [phone].     [employer] Navigator:  Just1Navigator program, a?free service to help individuals and families through the journey of determining care for older adults.  The "Navigator" is a social worker, Nicole Reynolds, who will speak with a prospective client and/or loved ones to provide an assessment of the situation and a set of recommendations for a personalized care plan -- all free of charge, and whether?[employer] offers the needed service or not. If the need is not a service we provide, we are well-connected with reputable programs in town that we can refer you to.   www.well-springsolutions.org or to speak with the Navigator, call [phone].     

## 2023-06-02 ENCOUNTER — Encounter: Payer: Self-pay | Admitting: Cardiovascular Disease

## 2023-06-02 ENCOUNTER — Ambulatory Visit: Payer: Medicare Other | Attending: Cardiovascular Disease | Admitting: Cardiovascular Disease

## 2023-06-02 VITALS — BP 138/80 | HR 72 | Ht 73.0 in | Wt 204.6 lb

## 2023-06-02 DIAGNOSIS — I480 Paroxysmal atrial fibrillation: Secondary | ICD-10-CM | POA: Diagnosis not present

## 2023-06-02 DIAGNOSIS — Z8249 Family history of ischemic heart disease and other diseases of the circulatory system: Secondary | ICD-10-CM

## 2023-06-02 DIAGNOSIS — Z9889 Other specified postprocedural states: Secondary | ICD-10-CM | POA: Diagnosis not present

## 2023-06-02 DIAGNOSIS — I059 Rheumatic mitral valve disease, unspecified: Secondary | ICD-10-CM

## 2023-06-02 NOTE — Patient Instructions (Addendum)
Medication Instructions:  Your physician recommends that you continue on your current medications as directed. Please refer to the Current Medication list given to you today.  *If you need a refill on your cardiac medications before your next appointment, please call your pharmacy*  Lab Work: Your physician recommends that you return for lab work in: next week or two for fasting lipid and CMET  If you have labs (blood work) drawn today and your tests are completely normal, you will receive your results only by: MyChart Message (if you have MyChart) OR A paper copy in the mail If you have any lab test that is abnormal or we need to change your treatment, we will call you to review the results.  Testing/Procedures: Your physician has requested that you have an echocardiogram. Echocardiography is a painless test that uses sound waves to create images of your heart. It provides your doctor with information about the size and shape of your heart and how well your heart's chambers and valves are working. This procedure takes approximately one hour. There are no restrictions for this procedure. Please do NOT wear cologne, perfume, aftershave, or lotions (deodorant is allowed). Please arrive 15 minutes prior to your appointment time.  Cardiac CT scanning for calcium score, (CAT scanning), is a noninvasive, special x-ray that produces cross-sectional images of the body using x-rays and a computer. CT scans help physicians diagnose and treat medical conditions. For some CT exams, a contrast material is used to enhance visibility in the area of the body being studied. CT scans provide greater clarity and reveal more details than regular x-ray exams.  Follow-Up: At [facility], you and your health needs are our priority.  As part of our continuing mission to provide you with exceptional heart care, we have created designated Provider Care Teams.  These Care Teams include your primary Cardiologist  (physician) and Advanced Practice Providers (APPs -  Physician Assistants and Nurse Practitioners) who all work together to provide you with the care you need, when you need it.  We recommend signing up for the patient portal called "MyChart".  Sign up information is provided on this After Visit Summary.  MyChart is used to connect with patients for Virtual Visits (Telemedicine).  Patients are able to view lab/test results, encounter notes, upcoming appointments, etc.  Non-urgent messages can be sent to your provider as well.   To learn more about what you can do with MyChart, go to [email or web address].    Your next appointment:   1 year(s)  Provider:   Charlton Haws, MD

## 2023-06-02 NOTE — Addendum Note (Signed)
Addended by: Virl Axe, Jahn Franchini L on: 06/02/2023 09:47 AM   Modules accepted: Orders

## 2023-06-16 ENCOUNTER — Other Ambulatory Visit: Payer: Medicare Other

## 2023-06-19 ENCOUNTER — Ambulatory Visit (HOSPITAL_BASED_OUTPATIENT_CLINIC_OR_DEPARTMENT_OTHER)
Admission: RE | Admit: 2023-06-19 | Discharge: 2023-06-19 | Disposition: A | Discharge status: Home / Self Care | Payer: Medicare Other | Source: Ambulatory Visit | Attending: Cardiovascular Disease | Admitting: Cardiovascular Disease

## 2023-06-19 DIAGNOSIS — R748 Abnormal levels of other serum enzymes: Secondary | ICD-10-CM | POA: Diagnosis not present

## 2023-06-19 DIAGNOSIS — I059 Rheumatic mitral valve disease, unspecified: Secondary | ICD-10-CM | POA: Diagnosis not present

## 2023-06-19 DIAGNOSIS — R7989 Other specified abnormal findings of blood chemistry: Secondary | ICD-10-CM | POA: Diagnosis not present

## 2023-06-19 DIAGNOSIS — E7800 Pure hypercholesterolemia, unspecified: Secondary | ICD-10-CM | POA: Diagnosis not present

## 2023-06-19 DIAGNOSIS — I480 Paroxysmal atrial fibrillation: Secondary | ICD-10-CM | POA: Insufficient documentation

## 2023-06-19 DIAGNOSIS — Z9889 Other specified postprocedural states: Secondary | ICD-10-CM | POA: Insufficient documentation

## 2023-06-19 DIAGNOSIS — Z8249 Family history of ischemic heart disease and other diseases of the circulatory system: Secondary | ICD-10-CM | POA: Insufficient documentation

## 2023-06-23 ENCOUNTER — Telehealth: Payer: Self-pay

## 2023-06-23 DIAGNOSIS — E7800 Pure hypercholesterolemia, unspecified: Secondary | ICD-10-CM

## 2023-06-23 NOTE — Telephone Encounter (Signed)
-----   Message from Charlton Haws sent at 06/23/2023 11:32 AM EDT ----- Calcium score quite high Had cath in 2022 with non obstructive dx see prior note about lipid clinic referral for PSK9

## 2023-06-23 NOTE — Telephone Encounter (Signed)
The patient has been notified of the result and verbalized understanding.  All questions (if any) were answered. Ethelda Chick, RN 06/23/2023 3:25 PM   Will send results to patient's PCP. Will place order for referral.

## 2023-06-26 ENCOUNTER — Ambulatory Visit (HOSPITAL_COMMUNITY): Payer: Medicare Other | Attending: Cardiology

## 2023-06-26 DIAGNOSIS — I480 Paroxysmal atrial fibrillation: Secondary | ICD-10-CM | POA: Insufficient documentation

## 2023-06-26 DIAGNOSIS — Z9889 Other specified postprocedural states: Secondary | ICD-10-CM | POA: Insufficient documentation

## 2023-06-26 DIAGNOSIS — Z8249 Family history of ischemic heart disease and other diseases of the circulatory system: Secondary | ICD-10-CM | POA: Insufficient documentation

## 2023-06-26 DIAGNOSIS — I059 Rheumatic mitral valve disease, unspecified: Secondary | ICD-10-CM | POA: Insufficient documentation

## 2023-06-26 LAB — ECHOCARDIOGRAM COMPLETE
Area-P 1/2: 2.64 cm2
MV VTI: 2.5 cm2
S' Lateral: 2.6 cm

## 2023-07-03 DIAGNOSIS — H8102 Meniere's disease, left ear: Secondary | ICD-10-CM | POA: Diagnosis not present

## 2023-07-07 DIAGNOSIS — Z0000 Encounter for general adult medical examination without abnormal findings: Secondary | ICD-10-CM | POA: Diagnosis not present

## 2023-07-07 DIAGNOSIS — G4733 Obstructive sleep apnea (adult) (pediatric): Secondary | ICD-10-CM | POA: Diagnosis not present

## 2023-07-15 NOTE — Progress Notes (Deleted)
Patient ID: JAMEY NAAS                 DOB: 1951/03/11                    MRN: [id number]     HPI: Brandon [name] is a 72 y.o. male patient referred to lipid clinic by Dr. Eden Emms. PMH is significant for afib, anemia, Parkinson's disease, endocarditis of mitral valve s/p MVR, HLD, hypothyroidism. 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 The patient was diagnosed with atrial fibrillation postoperatively following MVR. He was on a short course of amiodarone but this was discontinued. Patient seen by Dr Anne Fu on 08/06/21 for an irregular rhythm on his Kardia mobile device. ECG showed rate controlled typical atrial flutter. Patient was restarted on Eliquis and underwent DCCV on 09/03/21. Patient seen in the Afib clinic 09/05/21 and in [facility]. Eliquis could be stopped after 4 weeks with CHADSVASC 1. 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 Last visit with Dr. Eden Emms on 06/02/23. Coronary Ca ordered to help with risk stratification. On 06/26/23 coronary Ca score of 581 (76th percentile for age, race, and sex). Echo on 06/26/23 LVEF 65%. Lipid panel on 06/19/23 elevated. Has a hx of intolerance to statins and was referred to pharmacy for lipid management.  Today ***  Current Medications: none Intolerances: atorvastatin 10 mg? Risk Factors: CAD, Coronary Ca score 581 (76th percentile for age, race, and sex), HLD LDL goal: <55 mg/ml  Diet:   Exercise: elliptical 30 minutes for 3 days/week  Family History: father- emphysema, heart disease; sister- heart attack; brother- heart attack; mother- HTN  Social History: no tobacco, alcohol- 4 drink/week (beer/wine)  Labs: 06/19/23: TC 133, TG 111, HDL 51, LDL 162 (no meds) Past Medical History:  Diagnosis Date   Anemia    Atrial fibrillation (HCC)    Bacteremia due to Enterococcus    BPH (benign prostatic hyperplasia)    Cervical spine disease    Chest pain 2007   none since   COVID    Endocarditis of mitral valve    Gallbladder disorder     Hemopneumothorax on left    Hiatal hernia    Hypercholesteremia    Hyperlipidemia    Hyperthyroidism    Hypoxia    Meniere's disease    Parkinson disease    Pneumothorax on right    PONV (postoperative nausea and vomiting)    also has trouble waking up   S/P MVR (mitral valve replacement)    SOB (shortness of breath)     Current Outpatient Medications on File Prior to Visit  Medication Sig Dispense Refill   aspirin EC 81 MG tablet Take 1 tablet (81 mg total) by mouth daily. Swallow whole. 90 tablet 3   cholecalciferol (VITAMIN D) 1000 units tablet Take 1,000 Units by mouth daily.     fluticasone (FLONASE) 50 MCG/ACT nasal spray Place 1 spray into both nostrils daily as needed for allergies.     levothyroxine (SYNTHROID) 175 MCG tablet Take 175 mcg by mouth daily before breakfast.     lisinopril (ZESTRIL) 2.5 MG tablet 1 tablet Orally Once a day for 30 days     Misc Natural Products (JOINT HEALTH PO) Take 1 tablet by mouth daily.     PROBIOTIC, LACTOBACILLUS, PO Take 1 capsule by mouth daily.     testosterone cypionate (DEPOTESTOSTERONE CYPIONATE) 200 MG/ML injection Inject 120 mg into the muscle every Saturday. 0.6 mL     traZODone (DESYREL)  100 MG tablet Take 50-100 mg by mouth at bedtime.     triamterene-hydrochlorothiazide (DYAZIDE) 37.5-25 MG capsule Take 1 capsule by mouth daily.     vitamin B-12 (CYANOCOBALAMIN) 1000 MCG tablet Take 1,000 mcg by mouth daily.     No current facility-administered medications on file prior to visit.    Allergies  Allergen Reactions   Ceftin [Cefuroxime Axetil] Diarrhea   Oxycodone Nausea And Vomiting   Prednisone & Diphenhydramine     Other reaction(s): Unknown Other reaction(s): Unknown   Statins     Other reaction(s): achy  felt  generally  terrible   Iohexol     Causes pt to sneeze 03/02/21 pt received prednisone and benadryl 1hr before CT Abd with contrast, tolerated w/o issue 03/10/17 Patient started sneezing during the injection and  continued sneezing after exam. ER doc notified and patient was given benadryl and cortisone    Assessment/Plan:  1. Hyperlipidemia -  Reviewed options for lowering LDL cholesterol, including statins, ezetimibe, PCSK9 inhibitors, Nexletol/Nexlizet, and Leqvio. Discussed mechanisms of action, dosing, side effects and potential decreases in LDL cholesterol. Also reviewed cost information and potential options for patient assistance.    Adam Phenix, PharmD PGY-1 Pharmacy Resident

## 2023-07-16 ENCOUNTER — Ambulatory Visit: Payer: Medicare Other | Attending: Cardiovascular Disease

## 2023-07-17 ENCOUNTER — Encounter: Payer: Self-pay | Admitting: Cardiovascular Disease

## 2023-08-21 DIAGNOSIS — G4733 Obstructive sleep apnea (adult) (pediatric): Secondary | ICD-10-CM | POA: Diagnosis not present

## 2023-09-08 DIAGNOSIS — G4733 Obstructive sleep apnea (adult) (pediatric): Secondary | ICD-10-CM | POA: Diagnosis not present

## 2023-11-04 DIAGNOSIS — J322 Chronic ethmoidal sinusitis: Secondary | ICD-10-CM | POA: Diagnosis not present

## 2023-11-04 DIAGNOSIS — H6123 Impacted cerumen, bilateral: Secondary | ICD-10-CM | POA: Diagnosis not present

## 2023-12-08 DIAGNOSIS — D224 Melanocytic nevi of scalp and neck: Secondary | ICD-10-CM | POA: Diagnosis not present

## 2023-12-08 DIAGNOSIS — L812 Freckles: Secondary | ICD-10-CM | POA: Diagnosis not present

## 2023-12-08 DIAGNOSIS — D1801 Hemangioma of skin and subcutaneous tissue: Secondary | ICD-10-CM | POA: Diagnosis not present

## 2023-12-08 DIAGNOSIS — D225 Melanocytic nevi of trunk: Secondary | ICD-10-CM | POA: Diagnosis not present

## 2023-12-08 DIAGNOSIS — L853 Xerosis cutis: Secondary | ICD-10-CM | POA: Diagnosis not present

## 2023-12-08 DIAGNOSIS — L821 Other seborrheic keratosis: Secondary | ICD-10-CM | POA: Diagnosis not present

## 2023-12-08 DIAGNOSIS — D2261 Melanocytic nevi of right upper limb, including shoulder: Secondary | ICD-10-CM | POA: Diagnosis not present

## 2023-12-09 DIAGNOSIS — G4733 Obstructive sleep apnea (adult) (pediatric): Secondary | ICD-10-CM | POA: Diagnosis not present

## 2023-12-10 DIAGNOSIS — J321 Chronic frontal sinusitis: Secondary | ICD-10-CM | POA: Diagnosis not present

## 2023-12-10 DIAGNOSIS — I480 Paroxysmal atrial fibrillation: Secondary | ICD-10-CM | POA: Diagnosis not present

## 2023-12-10 DIAGNOSIS — R059 Cough, unspecified: Secondary | ICD-10-CM | POA: Diagnosis not present

## 2023-12-10 DIAGNOSIS — N1831 Chronic kidney disease, stage 3a: Secondary | ICD-10-CM | POA: Diagnosis not present

## 2023-12-10 DIAGNOSIS — I272 Pulmonary hypertension, unspecified: Secondary | ICD-10-CM | POA: Diagnosis not present

## 2023-12-10 DIAGNOSIS — G20C Parkinsonism, unspecified: Secondary | ICD-10-CM | POA: Diagnosis not present

## 2023-12-23 NOTE — Progress Notes (Signed)
Assessment/Plan:   1.  Parkinsons Disease  -Patient doesn't want to be on medication.  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 Yetta Barre for dermatology.  He saw him last week.   Understands that Parkinson's slightly increases risk for melanoma.  Discussed sunscreen  -Discussed exercise and he is doing that 3 days/week  2.  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  -Had postoperative atrial fibrillation, now resolved, status post cardioversion  Subjective:   Brandon [name] was seen today in follow up for Parkinsons disease.  My previous records were reviewed prior to todays visit as well as outside records available to me.  He is currently on no medicine.  He continues to do well from a Parkinson's standpoint.  He notes that things that cause vibration of the arms (weed eating) can "kick in" tremor.  He notes in when playing golf as well.   He has had no falls.  No lightheadedness or near syncope.  He is following with ENT for Mnire's, but has not had attack in many years.  Current prescribed movement disorder medications: None   PREVIOUS MEDICATIONS: none to date  ALLERGIES:   Allergies  Allergen Reactions   Ceftin [Cefuroxime Axetil] Diarrhea   Oxycodone Nausea And Vomiting   Prednisone & Diphenhydramine     Other reaction(s): Unknown Other reaction(s): Unknown   Statins     Other reaction(s): achy  felt  generally  terrible   Iohexol     Causes pt to sneeze 03/02/21 pt received prednisone and benadryl 1hr before CT Abd with contrast, tolerated w/o issue 03/10/17 Patient started sneezing during the injection and continued sneezing after exam. ER doc notified and patient was given benadryl and cortisone    CURRENT MEDICATIONS:  Outpatient Encounter Medications as of 12/29/2023  Medication Sig   aspirin EC 81 MG tablet Take 1 tablet (81 mg total) by mouth daily.  Swallow whole.   cholecalciferol (VITAMIN D) 1000 units tablet Take 1,000 Units by mouth daily.   fluticasone (FLONASE) 50 MCG/ACT nasal spray Place 1 spray into both nostrils daily as needed for allergies.   levothyroxine (SYNTHROID) 175 MCG tablet Take 175 mcg by mouth daily before breakfast.   lisinopril (ZESTRIL) 2.5 MG tablet 1 tablet Orally Once a day for 30 days   Misc Natural Products (JOINT HEALTH PO) Take 1 tablet by mouth daily.   PROBIOTIC, LACTOBACILLUS, PO Take 1 capsule by mouth daily.   testosterone cypionate (DEPOTESTOSTERONE CYPIONATE) 200 MG/ML injection Inject 120 mg into the muscle every Saturday. 0.6 mL   traZODone (DESYREL) 100 MG tablet Take 50-100 mg by mouth at bedtime.   triamterene-hydrochlorothiazide (DYAZIDE) 37.5-25 MG capsule Take 1 capsule by mouth daily.   vitamin B-12 (CYANOCOBALAMIN) 1000 MCG tablet Take 1,000 mcg by mouth daily.   No facility-administered encounter medications on file as of 12/29/2023.    Objective:   PHYSICAL EXAMINATION:    VITALS:   Vitals:   12/29/23 0818  BP: 124/86  Pulse: 72  SpO2: 98%  Weight: 211 lb 12.8 oz (96.1 kg)  Height: 6\' 1"  (1.854 m)    GEN:  The patient appears stated age and is in NAD. HEENT:  Normocephalic, atraumatic.  The mucous membranes are moist. The superficial temporal arteries are without ropiness or tenderness. CV:  RRR Lungs:  CTAB Neck:  no bruits   Neurological examination:  Orientation: The patient  is alert and oriented x3. Cranial nerves: There is good facial symmetry with min facial hypomimia. The speech is fluent and clear. Soft palate rises symmetrically and there is no tongue deviation. Hearing is intact to conversational tone. Sensation: Sensation is intact to light touch throughout Motor: Strength is at least antigravity x4.   Movement examination: Tone: There is mild increased tone on the right LE only Abnormal movements: rare RUE rest tremor Coordination:  There is no  decremation today, with any form of RAMS, including alternating supination and pronation of the forearm, hand opening and closing, finger taps, heel taps and toe taps.  Gait and Station: The patient has no difficulty arising out of a deep-seated chair without the use of the hands. The patient's stride length is good    I have reviewed and interpreted the following labs independently    Chemistry      Component Value Date/Time   NA 142 06/19/2023 0928   K 4.7 06/19/2023 0928   CL 101 06/19/2023 0928   CO2 24 06/19/2023 0928   BUN 21 06/19/2023 0928   CREATININE 1.40 (H) 06/19/2023 0928      Component Value Date/Time   CALCIUM 9.1 06/19/2023 0928   ALKPHOS 80 06/19/2023 0928   AST 19 06/19/2023 0928   ALT 26 06/19/2023 0928   BILITOT 1.9 (H) 06/19/2023 0928       Lab Results  Component Value Date   WBC 7.2 08/06/2021   HGB 14.7 08/06/2021   HCT 45.3 08/06/2021   MCV 90 08/06/2021   PLT 283 08/06/2021    Lab Results  Component Value Date   TSH 2.550 03/04/2021      Cc:  Soundra Pilon, FNP

## 2023-12-29 ENCOUNTER — Ambulatory Visit: Payer: Medicare Other | Admitting: Neurology

## 2023-12-29 ENCOUNTER — Encounter: Payer: Self-pay | Admitting: Neurology

## 2023-12-29 VITALS — BP 124/86 | HR 72 | Ht 73.0 in | Wt 211.8 lb

## 2023-12-29 DIAGNOSIS — G20A1 Parkinson's disease without dyskinesia, without mention of fluctuations: Secondary | ICD-10-CM | POA: Diagnosis not present

## 2023-12-29 NOTE — Patient Instructions (Signed)
 Local and Online Resources for Power over Parkinson's Group?  January 2025 ?  LOCAL [place] PARKINSON'S GROUPS??  Power over Parkinson's Group:???  Upcoming Power over [employer] Meetings/Care Partner Support:? 2nd Wednesdays of the month at 2 pm:  January 8th, February 12th Contact Lynwood Dawley at [email or web address] or Amy Marriott at [email or web address] if interested in participating in this group?  ?  LOCAL EVENTS AND NEW OFFERINGS?  Dance Project Spring 2025:  January 14-May 20, Tuesdays 10-11 am.  All details on website: [email or web address] ACT [employer] Chair Yoga classes "Train and Gain", Fridays 10 am, [employer].  Contact Gina at [phone].   PWR! Moves Paint class!  Wednesdays at 10 am.  Please contact Lonia Blood, PT at [email or web address] if interested. Health visitor Classes offering at [employer]!? Tuesdays (Chair Yoga)  and Wednesdays (PWR! Moves)  1:00 pm.?? Contact Aldona Lento [phone] or [email or web address] Drumming for Parkinson's will be held on 2nd and 4th Mondays at 11:00 am.?? Located at the [place] of the [place] ([address] [place]. [place].) *Next class is January 13th.? Contact Albertina Parr at [email or web address] or [phone]?  [facility] Parkinson's Tai Chi Class, Mondays at 11 am.  Call [phone] for details  [facility] at [facility]- [address], [place] Wednesdays- 4:00 - 5:00 PM - specifically for Parkinson's Disease.  Free!  Contact Denny Peon, [place] - [phone] (clinic) or  [phone] (cell) or by email: [email or web address]   ?ONLINE EDUCATION AND SUPPORT?  Parkinson Foundation:? www.parkinson.org?  PD Health at Home continues:? Mindfulness Mondays, Wellness Wednesdays, Fitness Fridays??  Upcoming Education:??  Empowerment through Movement, Wednesday,  January 15th, 1-2 pm A Deep Dive into Deep Brain Stimulation (DBS), Wednesday, January 29th, 1-2 pm Expert Briefing:    Stay tuned Register for virtual education and expert briefings (webinars) at [email or web address]  Please check out their website to sign up for emails and see their full online offerings??  ?  [employer] Foundation:? www.[email or web address].org??  Third Thursday Webinars:? On the third Thursday of every month at 12 p.m. ET, join our free live webinars to learn about various aspects of living with Parkinson's disease and our work to speed medical breakthroughs.?  Upcoming Webinar:? Managing the Hidden Symptoms:  Mood and Motivation Changes in Parkinson's.  Thursday, January 16th at 12 noon.  Check out additional information on their website to see their full online offerings?  ?  [employer]:? www.[email or web address]?  Upcoming Webinar:   Stay tuned Series:? Living with Parkinson's Meetup.?? Third Thursdays each month, 3 pm?  Care Partner Monthly Meetup.? With Jillene Bucks [place].? First Tuesday of each month, 2 pm?  Check out additional information to Live Well Today on their website?  ?  Parkinson and Movement Disorders (PMD) Alliance:? www.pmdalliance.org?  Ne[employer]Life Online:? Online Education Events?  Sign up for emails, which are sent weekly to give you updates on programming and online offerings?  ?  Parkinson's Association of [place]:? www.parkinsonassociation.org?  Information on online support groups, education events, and online exercises including Yoga, Parkinson's exercises and more-LOTS of information on links to PD resources and online events?  Virtual Support Group through [employer] of the [place]-First Wednesday of each month at 2 pm   MOVEMENT AND EXERCISE OPPORTUNITIES?  PWR! Moves [place] class has returned!  Wednesdays at 10 am.  Please contact Lonia Blood, PT at  [email or web address] if interested. Parkinson's Exercise Class offerings at [employer]. Tuesdays (Chair yoga) and Wednesdays (PWR! Moves)  1:00  pm.?  Contact Aldona Lento [phone] or [email or web address]  Parkinson's Wellness Recovery (PWR! Moves)? www.pwr4life.org?  Info on the PWR! Virtual Experience:? You will have access to our expertise?through self-assessment, guided plans that start with the PD-specific fundamentals, educational content, tips, Q&A with an expert, and a growing Engineering geologist of PD-specific pre-recorded and live exercise classes of varying types and intensity - both physical and cognitive! If that is not enough, we offer 1:1 wellness consultations (in-person or virtual) to personalize your PWR! Dance movement psychotherapist.??  Parkinson [employer] Fridays:??  As part of the PD Health @ Home program, this free video series focuses each week on one aspect of fitness designed to support people living with Parkinson's.? These weekly videos highlight the Parkinson Foundation fitness guidelines for people with Parkinson's disease.?  MenusLocal.com.br?  Dance for PD website is offering free, live-stream classes throughout the week, as well as links to [employer] of classes:? [email or web address]://[email or web address]/?  Virtual dance and Pilates for Parkinson's classes: Click on the Community Tab> Parkinson's Movement Initiative Tab.? To register for classes and for more information, visit www.[email or web address] and click the "community" tab.??  [employer] Parkinson's Cycling Classes??  [facility]:? Thursdays @ Noon-Live classes at [employer] ([employer] at [email or web address]?or [phone])?  [facility]: Classes Tuesday, Wednesday and Thursday (contact [place] at [email or web address] ?or [phone])?  [employer]?  Varied levels of classes are offered Tuesdays and  Thursdays at [facility]??  Stretching with Byrd Hesselbach weekly class is also offered for people with Parkinson's?  To observe a class or for more information, call [phone] or email Patricia Nettle at [email or web address]?    ADDITIONAL SUPPORT AND RESOURCES?  [employer]:  Chiropractor:? www.well-springsolutions.org/caregiver-education/caregiver-support-group.? You may also contact Loleta Chance at [facility] -spring.org or [phone].????  Well-Spring Navigator:? Just1Navigator program, a?free service to help individuals and families through the journey of determining care for older adults.? The "Navigator" is a Child psychotherapist, Sidney Ace, who will speak with a prospective client and/or loved ones to provide an assessment of the situation and a set of recommendations for a personalized care plan -- all free of charge, and whether?[facility] [employer] offers the needed service or not. If the need is not a service we provide, we are well-connected with reputable programs in town that we can refer you to.? www.well-springsolutions.org or to speak with the Navigator, call [phone].?

## 2024-01-14 DIAGNOSIS — H43812 Vitreous degeneration, left eye: Secondary | ICD-10-CM | POA: Diagnosis not present

## 2024-01-14 DIAGNOSIS — H2513 Age-related nuclear cataract, bilateral: Secondary | ICD-10-CM | POA: Diagnosis not present

## 2024-03-11 DIAGNOSIS — G4733 Obstructive sleep apnea (adult) (pediatric): Secondary | ICD-10-CM | POA: Diagnosis not present

## 2024-04-24 DIAGNOSIS — E039 Hypothyroidism, unspecified: Secondary | ICD-10-CM | POA: Diagnosis not present

## 2024-04-24 DIAGNOSIS — N1831 Chronic kidney disease, stage 3a: Secondary | ICD-10-CM | POA: Diagnosis not present

## 2024-04-24 DIAGNOSIS — I480 Paroxysmal atrial fibrillation: Secondary | ICD-10-CM | POA: Diagnosis not present

## 2024-04-24 DIAGNOSIS — G20C Parkinsonism, unspecified: Secondary | ICD-10-CM | POA: Diagnosis not present

## 2024-05-08 DIAGNOSIS — J988 Other specified respiratory disorders: Secondary | ICD-10-CM | POA: Diagnosis not present

## 2024-05-10 ENCOUNTER — Encounter: Payer: Self-pay | Admitting: Neurology

## 2024-06-24 DIAGNOSIS — G20C Parkinsonism, unspecified: Secondary | ICD-10-CM | POA: Diagnosis not present

## 2024-06-24 DIAGNOSIS — E039 Hypothyroidism, unspecified: Secondary | ICD-10-CM | POA: Diagnosis not present

## 2024-06-24 DIAGNOSIS — N1831 Chronic kidney disease, stage 3a: Secondary | ICD-10-CM | POA: Diagnosis not present

## 2024-06-24 DIAGNOSIS — I480 Paroxysmal atrial fibrillation: Secondary | ICD-10-CM | POA: Diagnosis not present

## 2024-06-28 ENCOUNTER — Ambulatory Visit: Payer: Medicare Other | Admitting: Neurology

## 2024-07-06 NOTE — Progress Notes (Signed)
 Assessment/Plan:   1.  Parkinsons Disease  -Patient doesn't want to be on medication.  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 Joshua for dermatology.  He saw him last week.   Understands that Parkinson's slightly increases risk for melanoma.  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  -Had postoperative atrial fibrillation, now resolved, status post cardioversion  3.  Constipation  -discussed nature and pathophysiology and association with Parkinsons Disease, but he has had lifetime hx of constipation  -discussed importance of hydration.  Pt is to increase water intake  -pt is given a copy of the rancho recipe  -recommended daily colace  -recommended miralax  prn   Subjective:   Brandon [name] was seen today in follow up for Parkinsons disease.  My previous records were reviewed prior to todays visit as well as outside records available to me.  He is on no medication, by choice.  Weedeating will make it go.  Otherwise tremor is about steady and not too bad.  Balance is good.  He has had no falls.  Exercise 3 days/week at the gym.   No lightheadedness or near syncope.    Current prescribed movement disorder medications: None   PREVIOUS MEDICATIONS: none to date  ALLERGIES:   Allergies  Allergen Reactions   Ceftin [Cefuroxime Axetil] Diarrhea   Oxycodone  Nausea And Vomiting   Prednisone  & Diphenhydramine      Other reaction(s): Unknown Other reaction(s): Unknown   Statins     Other reaction(s): achy  felt  generally  terrible   Iohexol      Causes pt to sneeze 03/02/21 pt received prednisone  and benadryl  1hr before CT Abd with contrast, tolerated w/o issue 03/10/17 Patient started  sneezing during the injection and continued sneezing after exam. ER doc notified and patient was given benadryl  and cortisone    CURRENT MEDICATIONS:  Outpatient Encounter Medications as of 07/08/2024  Medication Sig   aspirin  EC 81 MG tablet Take 1 tablet (81 mg total) by mouth daily. Swallow whole.   cholecalciferol  (VITAMIN D ) 1000 units tablet Take 1,000 Units by mouth daily.   fluticasone  (FLONASE ) 50 MCG/ACT nasal spray Place 1 spray into both nostrils daily as needed for allergies.   levothyroxine  (SYNTHROID ) 175 MCG tablet Take 175 mcg by mouth daily before breakfast.   lisinopril (ZESTRIL) 2.5 MG tablet 1 tablet Orally Once a day for 30 days   Misc Natural Products (JOINT HEALTH PO) Take 1 tablet by mouth daily.   PROBIOTIC, LACTOBACILLUS, PO Take 1 capsule by mouth daily.   testosterone cypionate (DEPOTESTOSTERONE CYPIONATE) 200 MG/ML injection Inject 120 mg into the muscle every Saturday. 0.6 mL   traZODone  (DESYREL ) 100 MG tablet Take 50-100 mg by mouth at bedtime.   triamterene -hydrochlorothiazide  (DYAZIDE ) 37.5-25 MG capsule Take 1 capsule by mouth daily.   vitamin B-12 (CYANOCOBALAMIN ) 1000 MCG tablet Take 1,000 mcg by mouth daily.   No facility-administered encounter medications on file as of 07/08/2024.    Objective:   PHYSICAL EXAMINATION:    VITALS:   Vitals:   07/08/24 0827  BP: 128/84  Pulse: 75  SpO2: 96%  Weight: 206 lb 12.8 oz (93.8 kg)     GEN:  The patient appears stated  age and is in NAD. HEENT:  Normocephalic, atraumatic.  The mucous membranes are moist. The superficial temporal arteries are without ropiness or tenderness. CV:  RRR Lungs:  CTAB Neck:  no bruits   Neurological examination:  Orientation: The patient is alert and oriented x3. Cranial nerves: There is good facial symmetry with min facial hypomimia. The speech is fluent and clear. Soft palate rises symmetrically and there is no tongue deviation. Hearing is intact to conversational  tone. Sensation: Sensation is intact to light touch throughout Motor: Strength is at least antigravity x4.   Movement examination: Tone: There is nl tone in the UE/LE Abnormal movements: rare RUE rest tremor Coordination:  There is no decremation today, with any form of RAMS, including alternating supination and pronation of the forearm, hand opening and closing, finger taps, heel taps and toe taps.  Gait and Station: The patient has no difficulty arising out of a deep-seated chair without the use of the hands. The patient's stride length is good    I have reviewed and interpreted the following labs independently    Chemistry      Component Value Date/Time   NA 142 06/19/2023 0928   K 4.7 06/19/2023 0928   CL 101 06/19/2023 0928   CO2 24 06/19/2023 0928   BUN 21 06/19/2023 0928   CREATININE 1.40 (H) 06/19/2023 0928      Component Value Date/Time   CALCIUM 9.1 06/19/2023 0928   ALKPHOS 80 06/19/2023 0928   AST 19 06/19/2023 0928   ALT 26 06/19/2023 0928   BILITOT 1.9 (H) 06/19/2023 0928       Lab Results  Component Value Date   WBC 7.2 08/06/2021   HGB 14.7 08/06/2021   HCT 45.3 08/06/2021   MCV 90 08/06/2021   PLT 283 08/06/2021    Lab Results  Component Value Date   TSH 2.550 03/04/2021      Cc:  Marvene Prentice SAUNDERS, FNP

## 2024-07-08 ENCOUNTER — Ambulatory Visit: Admitting: Neurology

## 2024-07-08 ENCOUNTER — Encounter: Payer: Self-pay | Admitting: Neurology

## 2024-07-08 VITALS — BP 128/84 | HR 75 | Wt 206.8 lb

## 2024-07-08 DIAGNOSIS — G20A1 Parkinson's disease without dyskinesia, without mention of fluctuations: Secondary | ICD-10-CM | POA: Diagnosis not present

## 2024-07-08 DIAGNOSIS — K5901 Slow transit constipation: Secondary | ICD-10-CM | POA: Diagnosis not present

## 2024-07-08 NOTE — Patient Instructions (Addendum)
 Constipation and Parkinson's disease:  1.Rancho recipe for constipation in Parkinsons Disease:  -1 cup of unprocessed bran (need to get this at [employer], [employer] or similar type of store), 2 cups of applesauce in 1 cup of prune juice 2.  Increase fiber intake (Metamucil,vegetables) 3.  Regular, moderate exercise can be beneficial. 4.  Avoid medications causing constipation, such as medications like antacids with calcium or magnesium  5.  It's okay to take daily Miralax , and taper if stools become too loose or you experience diarrhea 6.  Stool softeners (Colace) can help with chronic constipation and I recommend you take this daily. 7.  Increase water intake.  You should be drinking 1/2 gallon of water a day as long as you have not been diagnosed with congestive heart failure or renal/kidney failure.  This is probably the single greatest thing that you can do to help your constipation.   SAVE THE DATE!  We are planning a Parkinsons Disease educational symposium at [facility], [address] [place], [place], [place] [address] on September 19.  We will have a movement disorder physician expert from [employer] coming to speak and a caregiver speaker.  We will have a panel of experts that will show you who you may need on your team of people on your journey with Parkinsons.  I hope to see you there!  Use this QR code to register by scanning it with the camera app on your phone:      Need more help with registration?  Contact [email or web address]

## 2024-07-12 DIAGNOSIS — F5101 Primary insomnia: Secondary | ICD-10-CM | POA: Diagnosis not present

## 2024-07-12 DIAGNOSIS — G20C Parkinsonism, unspecified: Secondary | ICD-10-CM | POA: Diagnosis not present

## 2024-07-12 DIAGNOSIS — E7800 Pure hypercholesterolemia, unspecified: Secondary | ICD-10-CM | POA: Diagnosis not present

## 2024-07-12 DIAGNOSIS — N1831 Chronic kidney disease, stage 3a: Secondary | ICD-10-CM | POA: Diagnosis not present

## 2024-07-12 DIAGNOSIS — E039 Hypothyroidism, unspecified: Secondary | ICD-10-CM | POA: Diagnosis not present

## 2024-07-12 DIAGNOSIS — D582 Other hemoglobinopathies: Secondary | ICD-10-CM | POA: Diagnosis not present

## 2024-07-12 DIAGNOSIS — Z0000 Encounter for general adult medical examination without abnormal findings: Secondary | ICD-10-CM | POA: Diagnosis not present

## 2024-07-16 DIAGNOSIS — N1831 Chronic kidney disease, stage 3a: Secondary | ICD-10-CM | POA: Diagnosis not present

## 2024-07-16 DIAGNOSIS — E7800 Pure hypercholesterolemia, unspecified: Secondary | ICD-10-CM | POA: Diagnosis not present

## 2024-07-16 DIAGNOSIS — D582 Other hemoglobinopathies: Secondary | ICD-10-CM | POA: Diagnosis not present

## 2024-07-16 DIAGNOSIS — E039 Hypothyroidism, unspecified: Secondary | ICD-10-CM | POA: Diagnosis not present

## 2024-07-22 ENCOUNTER — Other Ambulatory Visit: Payer: Self-pay | Admitting: Family Medicine

## 2024-07-22 DIAGNOSIS — E7800 Pure hypercholesterolemia, unspecified: Secondary | ICD-10-CM

## 2024-07-22 DIAGNOSIS — K219 Gastro-esophageal reflux disease without esophagitis: Secondary | ICD-10-CM

## 2024-07-22 DIAGNOSIS — D582 Other hemoglobinopathies: Secondary | ICD-10-CM

## 2024-07-22 DIAGNOSIS — E291 Testicular hypofunction: Secondary | ICD-10-CM

## 2024-07-22 DIAGNOSIS — N1831 Chronic kidney disease, stage 3a: Secondary | ICD-10-CM

## 2024-07-23 ENCOUNTER — Telehealth: Payer: Self-pay

## 2024-07-23 MED ORDER — PREDNISONE 50 MG PO TABS: ORAL_TABLET | ORAL | 0 refills | Status: AC

## 2024-07-23 NOTE — Telephone Encounter (Signed)
 Phone call to patient to review instructions for 13-hr prep for CT scan of Abdomen/Pelvis w/ contrast on 07/30/2024 at 1340. Prescription called into [facility] in [place]. Patient is aware and verbalized understanding of instructions.

## 2024-07-23 NOTE — Telephone Encounter (Signed)
 Pt to take 50 mg of prednisone  on 07/30/2024 at 0040, 50 mg of prednisone  on 07/30/2024 at 0640, and 50 mg of prednisone  on 07/30/2024 at 1240. Pt is also to take 50 mg of benadryl  on 07/30/2024 at 1240. Please call [phone] with any questions.

## 2024-07-25 DIAGNOSIS — I480 Paroxysmal atrial fibrillation: Secondary | ICD-10-CM | POA: Diagnosis not present

## 2024-07-25 DIAGNOSIS — N1831 Chronic kidney disease, stage 3a: Secondary | ICD-10-CM | POA: Diagnosis not present

## 2024-07-25 DIAGNOSIS — G20C Parkinsonism, unspecified: Secondary | ICD-10-CM | POA: Diagnosis not present

## 2024-07-30 ENCOUNTER — Ambulatory Visit
Admission: RE | Admit: 2024-07-30 | Discharge: 2024-07-30 | Disposition: A | Discharge status: Home / Self Care | Source: Ambulatory Visit | Attending: Family Medicine | Admitting: Family Medicine

## 2024-07-30 DIAGNOSIS — N1831 Chronic kidney disease, stage 3a: Secondary | ICD-10-CM

## 2024-07-30 DIAGNOSIS — K219 Gastro-esophageal reflux disease without esophagitis: Secondary | ICD-10-CM

## 2024-07-30 DIAGNOSIS — D582 Other hemoglobinopathies: Secondary | ICD-10-CM

## 2024-07-30 DIAGNOSIS — E291 Testicular hypofunction: Secondary | ICD-10-CM

## 2024-07-30 DIAGNOSIS — E7800 Pure hypercholesterolemia, unspecified: Secondary | ICD-10-CM

## 2024-07-30 DIAGNOSIS — N183 Chronic kidney disease, stage 3 unspecified: Secondary | ICD-10-CM | POA: Diagnosis not present

## 2024-07-30 MED ORDER — IOPAMIDOL (ISOVUE-300) INJECTION 61%
75.0000 mL | Freq: Once | INTRAVENOUS | Status: AC | PRN
  Administered 2024-07-30: 75 mL via INTRAVENOUS

## 2024-08-19 DIAGNOSIS — D582 Other hemoglobinopathies: Secondary | ICD-10-CM | POA: Diagnosis not present

## 2024-08-19 DIAGNOSIS — N1831 Chronic kidney disease, stage 3a: Secondary | ICD-10-CM | POA: Diagnosis not present

## 2024-08-24 DIAGNOSIS — G20C Parkinsonism, unspecified: Secondary | ICD-10-CM | POA: Diagnosis not present

## 2024-08-24 DIAGNOSIS — E039 Hypothyroidism, unspecified: Secondary | ICD-10-CM | POA: Diagnosis not present

## 2024-08-24 DIAGNOSIS — I480 Paroxysmal atrial fibrillation: Secondary | ICD-10-CM | POA: Diagnosis not present

## 2024-08-24 DIAGNOSIS — N1831 Chronic kidney disease, stage 3a: Secondary | ICD-10-CM | POA: Diagnosis not present

## 2024-08-26 DIAGNOSIS — F5101 Primary insomnia: Secondary | ICD-10-CM | POA: Diagnosis not present

## 2024-08-26 DIAGNOSIS — G4733 Obstructive sleep apnea (adult) (pediatric): Secondary | ICD-10-CM | POA: Diagnosis not present

## 2024-08-26 DIAGNOSIS — I480 Paroxysmal atrial fibrillation: Secondary | ICD-10-CM | POA: Diagnosis not present

## 2025-01-31 ENCOUNTER — Ambulatory Visit: Admitting: Neurology
